# Patient Record
Sex: Female | Born: 1995 | Race: Black or African American | Hispanic: No | Marital: Single | Smoking: Never smoker
Health system: Southern US, Community
[De-identification: ages and names within clinical notes are randomized; demographics above are authoritative.]

## PROBLEM LIST (undated history)

## (undated) ENCOUNTER — Inpatient Hospital Stay (HOSPITAL_COMMUNITY): Payer: Self-pay

## (undated) DIAGNOSIS — O1410 Severe pre-eclampsia, unspecified trimester: Secondary | ICD-10-CM

## (undated) DIAGNOSIS — B951 Streptococcus, group B, as the cause of diseases classified elsewhere: Secondary | ICD-10-CM

## (undated) DIAGNOSIS — A609 Anogenital herpesviral infection, unspecified: Secondary | ICD-10-CM

## (undated) DIAGNOSIS — D649 Anemia, unspecified: Secondary | ICD-10-CM

## (undated) DIAGNOSIS — Z973 Presence of spectacles and contact lenses: Secondary | ICD-10-CM

## (undated) DIAGNOSIS — O09299 Supervision of pregnancy with other poor reproductive or obstetric history, unspecified trimester: Secondary | ICD-10-CM

## (undated) DIAGNOSIS — S334XXA Traumatic rupture of symphysis pubis, initial encounter: Secondary | ICD-10-CM

## (undated) DIAGNOSIS — F329 Major depressive disorder, single episode, unspecified: Secondary | ICD-10-CM

## (undated) DIAGNOSIS — R45851 Suicidal ideations: Secondary | ICD-10-CM

## (undated) DIAGNOSIS — L988 Other specified disorders of the skin and subcutaneous tissue: Secondary | ICD-10-CM

## (undated) DIAGNOSIS — F32A Depression, unspecified: Secondary | ICD-10-CM

## (undated) DIAGNOSIS — O139 Gestational [pregnancy-induced] hypertension without significant proteinuria, unspecified trimester: Secondary | ICD-10-CM

## (undated) DIAGNOSIS — F419 Anxiety disorder, unspecified: Secondary | ICD-10-CM

## (undated) DIAGNOSIS — A491 Streptococcal infection, unspecified site: Secondary | ICD-10-CM

## (undated) HISTORY — DX: Anemia, unspecified: D64.9

## (undated) HISTORY — DX: Streptococcal infection, unspecified site: A49.1

## (undated) HISTORY — DX: Anogenital herpesviral infection, unspecified: A60.9

## (undated) HISTORY — PX: NO PAST SURGERIES: SHX2092

## (undated) HISTORY — DX: Traumatic rupture of symphysis pubis, initial encounter: S33.4XXA

## (undated) HISTORY — DX: Supervision of pregnancy with other poor reproductive or obstetric history, unspecified trimester: O09.299

## (undated) HISTORY — DX: Depression, unspecified: F32.A

## (undated) HISTORY — DX: Major depressive disorder, single episode, unspecified: F32.9

---
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
## 2015-07-20 NOTE — L&D Delivery Note (Addendum)
Delivery Note At 8:49 PM a viable female was delivered via Vaginal, Spontaneous Delivery (Presentation: ROA with compound right arm).  APGAR: 9, 9; weight  .   Placenta status: Spontaneous, intact.  Cord: WNL, with the following complications: .  Cord pH: NA  Anesthesia:  Epidural, local for repair Episiotomy: None Lacerations: 2nd degree perineal; Right periurethral--hemostatic, did not require sutures Suture Repair: 3.0 vicryl Est. Blood Loss (mL): 250  Mom to postpartum.  Baby to Couplet care / Skin to Skin. Patient undecided regarding contraception.  Sporadic BPs meeting treatment critera noted after delivery--will begin Mag sulfate x 24 hours pp.  Will treat BP supplementally with IV meds prn. Repeat PIH labs in the am.  Nigel BridgemanLATHAM, Ercia Crisafulli 03/17/2016, 9:50 PM

## 2015-07-30 ENCOUNTER — Inpatient Hospital Stay (HOSPITAL_COMMUNITY)
Admission: AD | Admit: 2015-07-30 | Discharge: 2015-07-31 | Disposition: A | Discharge status: Home / Self Care | Payer: Medicaid Other | Source: Ambulatory Visit | Attending: Obstetrics and Gynecology | Admitting: Obstetrics and Gynecology

## 2015-07-30 ENCOUNTER — Encounter (HOSPITAL_COMMUNITY): Payer: Self-pay

## 2015-07-30 DIAGNOSIS — Z3201 Encounter for pregnancy test, result positive: Secondary | ICD-10-CM | POA: Diagnosis not present

## 2015-07-30 DIAGNOSIS — R109 Unspecified abdominal pain: Secondary | ICD-10-CM | POA: Diagnosis present

## 2015-07-30 DIAGNOSIS — Z0189 Encounter for other specified special examinations: Secondary | ICD-10-CM | POA: Diagnosis present

## 2015-07-30 DIAGNOSIS — R102 Pelvic and perineal pain: Secondary | ICD-10-CM | POA: Diagnosis not present

## 2015-07-30 DIAGNOSIS — O26891 Other specified pregnancy related conditions, first trimester: Secondary | ICD-10-CM | POA: Insufficient documentation

## 2015-07-30 DIAGNOSIS — Z3A00 Weeks of gestation of pregnancy not specified: Secondary | ICD-10-CM | POA: Insufficient documentation

## 2015-07-30 DIAGNOSIS — O3680X0 Pregnancy with inconclusive fetal viability, not applicable or unspecified: Secondary | ICD-10-CM

## 2015-07-30 DIAGNOSIS — J45909 Unspecified asthma, uncomplicated: Secondary | ICD-10-CM | POA: Diagnosis not present

## 2015-07-30 LAB — URINALYSIS, ROUTINE W REFLEX MICROSCOPIC
Bilirubin Urine: NEGATIVE
Glucose, UA: NEGATIVE mg/dL
HGB URINE DIPSTICK: NEGATIVE
Ketones, ur: 15 mg/dL — AB
Leukocytes, UA: NEGATIVE
Nitrite: NEGATIVE
Protein, ur: NEGATIVE mg/dL
SPECIFIC GRAVITY, URINE: 1.025 (ref 1.005–1.030)
pH: 7.5 (ref 5.0–8.0)

## 2015-07-30 LAB — POCT PREGNANCY, URINE: PREG TEST UR: POSITIVE — AB

## 2015-07-30 NOTE — MAU Note (Signed)
Pt states she had +upt at home tonight. LMP: 07/01/2015. Having some lower abdominal cramping since Monday-thought period was about to start. Rates 2/10. Denies bleeding.

## 2015-07-31 ENCOUNTER — Inpatient Hospital Stay (HOSPITAL_COMMUNITY): Payer: Medicaid Other

## 2015-07-31 DIAGNOSIS — O26891 Other specified pregnancy related conditions, first trimester: Secondary | ICD-10-CM

## 2015-07-31 DIAGNOSIS — R102 Pelvic and perineal pain: Secondary | ICD-10-CM

## 2015-07-31 LAB — RPR: RPR: NONREACTIVE

## 2015-07-31 LAB — GC/CHLAMYDIA PROBE AMP (~~LOC~~) NOT AT ARMC
Chlamydia: NEGATIVE
Neisseria Gonorrhea: NEGATIVE

## 2015-07-31 LAB — CBC
HEMATOCRIT: 33 % — AB (ref 36.0–46.0)
HEMOGLOBIN: 10.7 g/dL — AB (ref 12.0–15.0)
MCH: 27 pg (ref 26.0–34.0)
MCHC: 32.4 g/dL (ref 30.0–36.0)
MCV: 83.1 fL (ref 78.0–100.0)
Platelets: 335 10*3/uL (ref 150–400)
RBC: 3.97 MIL/uL (ref 3.87–5.11)
RDW: 14.5 % (ref 11.5–15.5)
WBC: 9.3 10*3/uL (ref 4.0–10.5)

## 2015-07-31 LAB — WET PREP, GENITAL
Sperm: NONE SEEN
TRICH WET PREP: NONE SEEN
YEAST WET PREP: NONE SEEN

## 2015-07-31 LAB — HIV ANTIBODY (ROUTINE TESTING W REFLEX): HIV Screen 4th Generation wRfx: NONREACTIVE

## 2015-07-31 LAB — HCG, QUANTITATIVE, PREGNANCY: HCG, BETA CHAIN, QUANT, S: 517 m[IU]/mL — AB (ref <5)

## 2015-07-31 LAB — ABO/RH: ABO/RH(D): O POS

## 2015-07-31 NOTE — Discharge Instructions (Signed)

## 2015-07-31 NOTE — MAU Provider Note (Signed)
History     CSN: [id number]  Arrival date and time: 07/30/15 2322   First Provider Initiated Contact with Patient 07/30/15 2344      Chief Complaint  Patient presents with  . Possible Pregnancy  . Abdominal Cramping   Abdominal Cramping This is a new problem. The current episode started in the past 7 days. The onset quality is gradual. The problem occurs intermittently. The problem has been unchanged. The pain is located in the suprapubic region. The pain is at a severity of 3/10. The quality of the pain is cramping. The abdominal pain does not radiate. Pertinent negatives include no constipation, diarrhea, dysuria, fever, frequency, nausea or vomiting. Nothing aggravates the pain. The pain is relieved by nothing. She has tried nothing for the symptoms.    Past Medical History  Diagnosis Date  . Asthma     History reviewed. No pertinent past surgical history.  History reviewed. No pertinent family history.  Social History  Substance Use Topics  . Smoking status: Never Smoker   . Smokeless tobacco: Never Used  . Alcohol Use: No    Allergies: No Known Allergies  Prescriptions prior to admission  Medication Sig Dispense Refill Last Dose  . albuterol (PROVENTIL HFA;VENTOLIN HFA) 108 (90 BASE) MCG/ACT inhaler Inhale 2 puffs into the lungs every 6 (six) hours as needed. For shortness of breath   More than a month at Unknown time  . ibuprofen (ADVIL,MOTRIN) 200 MG tablet Take 200 mg by mouth every 6 (six) hours as needed.   More than a month at Unknown time  . norethindrone-ethinyl estradiol (MICROGESTIN,JUNEL,LOESTRIN) 1-20 MG-MCG tablet Take 1 tablet by mouth daily.   08/12/2013 at Unknown time    Review of Systems  Constitutional: Negative for fever and chills.  Gastrointestinal: Positive for abdominal pain. Negative for nausea, vomiting, diarrhea and constipation.  Genitourinary: Negative for dysuria, urgency and frequency.   Physical Exam   Blood pressure 126/89, pulse  104, temperature 98 F (36.7 C), temperature source Oral, resp. rate 20, height 5' 1.5" (1.562 m), weight 50.803 kg (112 lb), last menstrual period 07/01/2015, SpO2 100 %.  Physical Exam  Nursing note and vitals reviewed. Constitutional: She is oriented to person, place, and time. She appears well-developed and well-nourished. No distress.  HENT:  Head: Normocephalic.  Cardiovascular: Normal rate.   Respiratory: Effort normal.  GI: Soft. There is no tenderness. There is no rebound.  Genitourinary:   External: no lesion Vagina: small amount of white discharge Cervix: pink, smooth, no CMT Uterus: NSSC Adnexa: NT   Neurological: She is alert and oriented to person, place, and time.  Skin: Skin is warm and dry.  Psychiatric: She has a normal mood and affect.   Results for orders placed or performed during the hospital encounter of 07/30/15 (from the past 24 hour(s))  Urinalysis, Routine w reflex microscopic (not at [facility])     Status: Abnormal   Collection Time: 07/30/15 11:27 PM  Result Value Ref Range   Color, Urine YELLOW YELLOW   APPearance CLEAR CLEAR   Specific Gravity, Urine 1.025 1.005 - 1.030   pH 7.5 5.0 - 8.0   Glucose, UA NEGATIVE NEGATIVE mg/dL   Hgb urine dipstick NEGATIVE NEGATIVE   Bilirubin Urine NEGATIVE NEGATIVE   Ketones, ur 15 (A) NEGATIVE mg/dL   Protein, ur NEGATIVE NEGATIVE mg/dL   Nitrite NEGATIVE NEGATIVE   Leukocytes, UA NEGATIVE NEGATIVE  Pregnancy, urine POC     Status: Abnormal   Collection Time: 07/30/15 11:38  PM  Result Value Ref Range   Preg Test, Ur POSITIVE (A) NEGATIVE  Wet prep, genital     Status: Abnormal   Collection Time: 07/30/15 11:50 PM  Result Value Ref Range   Yeast Wet Prep HPF POC NONE SEEN NONE SEEN   Trich, Wet Prep NONE SEEN NONE SEEN   Clue Cells Wet Prep HPF POC PRESENT (A) NONE SEEN   WBC, Wet Prep HPF POC MODERATE (A) NONE SEEN   Sperm NONE SEEN   CBC     Status: Abnormal   Collection Time: 07/31/15 12:03 AM  Result  Value Ref Range   WBC 9.3 4.0 - 10.5 K/uL   RBC 3.97 3.87 - 5.11 MIL/uL   Hemoglobin 10.7 (L) 12.0 - 15.0 g/dL   HCT 62.9 (L) 52.8 - 41.3 %   MCV 83.1 78.0 - 100.0 fL   MCH 27.0 26.0 - 34.0 pg   MCHC 32.4 30.0 - 36.0 g/dL   RDW 24.4 01.0 - 27.2 %   Platelets 335 150 - 400 K/uL  ABO/Rh     Status: None   Collection Time: 07/31/15 12:03 AM  Result Value Ref Range   ABO/RH(D) O POS   hCG, quantitative, pregnancy     Status: Abnormal   Collection Time: 07/31/15 12:03 AM  Result Value Ref Range   hCG, Beta Chain, Quant, S 517 (H) <5 mIU/mL   US Ob Comp Less 14 Wks  07/31/2015  CLINICAL DATA:  20 year old female with abdominal cramping x4 days EXAM: OBSTETRIC <14 WK [place] AND TRANSVAGINAL OB US TECHNIQUE: Both transabdominal and transvaginal ultrasound examinations were performed for complete evaluation of the gestation as well as the maternal uterus, adnexal regions, and pelvic cul-de-sac. Transvaginal technique was performed to assess early pregnancy. COMPARISON:  None. FINDINGS: The uterus is anteverted. No intrauterine pregnancy identified. The endometrium measures 1.1 cm in thickness. Please note with positive HCG level and in the absence of documented intrauterine pregnancy, the possibility of an ectopic pregnancy is not excluded. Correlation with serial HCG levels and follow-up with ultrasound recommended. The right ovary measures 2.8 x 1.6 x 2.0 cm and the left ovary measures 2.4 x 1.3 x 1.6 cm. Small amount of free fluid noted within the pelvis. IMPRESSION: No intrauterine pregnancy identified. Clinical correlation and follow-up recommended. Electronically Signed   By: Elgie Collard M.D.   On: 07/31/2015 00:59   US Ob Transvaginal  07/31/2015  CLINICAL DATA:  20 year old female with abdominal cramping x4 days EXAM: OBSTETRIC <14 WK [place] AND TRANSVAGINAL OB US TECHNIQUE: Both transabdominal and transvaginal ultrasound examinations were performed for complete evaluation of the gestation as well  as the maternal uterus, adnexal regions, and pelvic cul-de-sac. Transvaginal technique was performed to assess early pregnancy. COMPARISON:  None. FINDINGS: The uterus is anteverted. No intrauterine pregnancy identified. The endometrium measures 1.1 cm in thickness. Please note with positive HCG level and in the absence of documented intrauterine pregnancy, the possibility of an ectopic pregnancy is not excluded. Correlation with serial HCG levels and follow-up with ultrasound recommended. The right ovary measures 2.8 x 1.6 x 2.0 cm and the left ovary measures 2.4 x 1.3 x 1.6 cm. Small amount of free fluid noted within the pelvis. IMPRESSION: No intrauterine pregnancy identified. Clinical correlation and follow-up recommended. Electronically Signed   By: Elgie Collard M.D.   On: 07/31/2015 00:59    MAU Course  Procedures  MDM   Assessment and Plan   1. Pregnancy, location unknown   2.  Pelvic pain affecting pregnancy in first trimester, antepartum    DC home Comfort measures reviewed  1stTrimester precautions  Bleeding precautions Ectopic precautions RX: none  Return to MAU as needed FU with OB as planned  Follow-up Information    [facility]   Why:  Saturday 08/01/14 in the morning for blood work.    Contact information:   [address] [id number] [place] [id number] [phone]       Tawnya CrookHogan, Kaiyan Luczak Donovan 07/31/2015, 12:18 AM

## 2015-08-02 ENCOUNTER — Inpatient Hospital Stay (HOSPITAL_COMMUNITY)
Admission: AD | Admit: 2015-08-02 | Discharge: 2015-08-02 | Disposition: A | Discharge status: Home / Self Care | Payer: Medicaid Other | Source: Ambulatory Visit | Attending: Obstetrics & Gynecology | Admitting: Obstetrics & Gynecology

## 2015-08-02 DIAGNOSIS — O0281 Inappropriate change in quantitative human chorionic gonadotropin (hCG) in early pregnancy: Secondary | ICD-10-CM

## 2015-08-02 DIAGNOSIS — R109 Unspecified abdominal pain: Secondary | ICD-10-CM | POA: Diagnosis not present

## 2015-08-02 DIAGNOSIS — O9989 Other specified diseases and conditions complicating pregnancy, childbirth and the puerperium: Secondary | ICD-10-CM | POA: Diagnosis not present

## 2015-08-02 DIAGNOSIS — O3680X0 Pregnancy with inconclusive fetal viability, not applicable or unspecified: Secondary | ICD-10-CM

## 2015-08-02 DIAGNOSIS — O26891 Other specified pregnancy related conditions, first trimester: Secondary | ICD-10-CM | POA: Insufficient documentation

## 2015-08-02 DIAGNOSIS — Z3A01 Less than 8 weeks gestation of pregnancy: Secondary | ICD-10-CM | POA: Insufficient documentation

## 2015-08-02 DIAGNOSIS — O26899 Other specified pregnancy related conditions, unspecified trimester: Secondary | ICD-10-CM

## 2015-08-02 LAB — HCG, QUANTITATIVE, PREGNANCY: hCG, Beta Chain, Quant, S: 1651 m[IU]/mL — ABNORMAL HIGH (ref <5)

## 2015-08-02 NOTE — MAU Provider Note (Signed)
S:  Kristin Bruce is a 20 y.o. female G1P0 at 5063w4d presenting to MAU for a follow up beta hcg level. Per chart review the patient was seen in MAU 2 days ago with abdominal pain. Currently she denies vaginal bleeding or abdominal pain.   Beta hcg level on 07/31/15 was 517.    O:  GENERAL: Well-developed, well-nourished female in no acute distress.  LUNGS: Effort normal SKIN: Warm, dry and without erythema PSYCH: Normal mood and affect  Filed Vitals:   08/02/15 1154  BP: 108/70  Pulse: 81  Temp: 98.1 F (36.7 C)  TempSrc: Oral  Resp: 16   MDM:  Results for orders placed or performed during the hospital encounter of 08/02/15 (from the past 48 hour(s))  hCG, quantitative, pregnancy     Status: Abnormal   Collection Time: 08/02/15 11:05 AM  Result Value Ref Range   hCG, Beta Chain, Quant, S 1651 (H) <5 mIU/mL    Comment:          GEST. AGE      CONC.  (mIU/mL)   <=1 WEEK        5 - 50     2 WEEKS       50 - 500     3 WEEKS       100 - 10,000     4 WEEKS     1,000 - 30,000     5 WEEKS     3,500 - 115,000   6-8 WEEKS     12,000 - 270,000    12 WEEKS     15,000 - 220,000        FEMALE AND NON-PREGNANT FEMALE:     LESS THAN 5 mIU/mL       A:  1. Pregnancy of unknown anatomic location   2. Abdominal pain in pregnancy   3. Elevated level of quantitative hCG for gestational age in early pregnancy     P:  Discharge home in stable condition Follow up US in 7 days; US to call patient to schedule and [facility].  Return to MAU if symptoms worsen.  Pelvic rest Ectopic precautions.   Duane LopeJennifer I Rasch, NP 08/02/2015 1:18 PM

## 2015-08-02 NOTE — MAU Note (Addendum)
When prompted by her mom, reports back has been aching.  Started about the time she missed her period. No pain right now, usually work related, stands a lot with her job.

## 2015-08-02 NOTE — Discharge Instructions (Signed)

## 2015-08-02 NOTE — MAU Note (Signed)
Doing ok.  Not having pain or bleeding.

## 2015-08-08 ENCOUNTER — Ambulatory Visit (INDEPENDENT_AMBULATORY_CARE_PROVIDER_SITE_OTHER): Payer: Medicaid Other | Admitting: Obstetrics & Gynecology

## 2015-08-08 ENCOUNTER — Encounter: Payer: Self-pay | Admitting: Obstetrics & Gynecology

## 2015-08-08 ENCOUNTER — Ambulatory Visit (HOSPITAL_COMMUNITY)
Admission: RE | Admit: 2015-08-08 | Discharge: 2015-08-08 | Disposition: A | Discharge status: Home / Self Care | Payer: Medicaid Other | Source: Ambulatory Visit | Attending: Obstetrics and Gynecology | Admitting: Obstetrics and Gynecology

## 2015-08-08 VITALS — BP 117/77 | HR 98 | Temp 98.7°F | Resp 18 | Ht 60.0 in | Wt 110.0 lb

## 2015-08-08 DIAGNOSIS — O0281 Inappropriate change in quantitative human chorionic gonadotropin (hCG) in early pregnancy: Secondary | ICD-10-CM

## 2015-08-08 DIAGNOSIS — O208 Other hemorrhage in early pregnancy: Secondary | ICD-10-CM | POA: Diagnosis not present

## 2015-08-08 DIAGNOSIS — O26891 Other specified pregnancy related conditions, first trimester: Secondary | ICD-10-CM | POA: Diagnosis not present

## 2015-08-08 DIAGNOSIS — R109 Unspecified abdominal pain: Secondary | ICD-10-CM | POA: Diagnosis not present

## 2015-08-08 DIAGNOSIS — O200 Threatened abortion: Secondary | ICD-10-CM

## 2015-08-08 DIAGNOSIS — Z3A01 Less than 8 weeks gestation of pregnancy: Secondary | ICD-10-CM | POA: Diagnosis not present

## 2015-08-08 DIAGNOSIS — O26899 Other specified pregnancy related conditions, unspecified trimester: Secondary | ICD-10-CM

## 2015-08-08 DIAGNOSIS — O3680X0 Pregnancy with inconclusive fetal viability, not applicable or unspecified: Secondary | ICD-10-CM

## 2015-08-08 MED ORDER — PRENATAL VITAMINS 0.8 MG PO TABS: 1.0000 | ORAL_TABLET | Freq: Every day | ORAL | Status: DC

## 2015-08-08 NOTE — Patient Instructions (Signed)
Incomplete Miscarriage A miscarriage is the sudden loss of an unborn baby (fetus) before the 20th week of pregnancy. In an incomplete miscarriage, parts of the fetus or placenta (afterbirth) remain in the body.  Having a miscarriage can be an emotional experience. Talk with your health care provider about any questions you may have about miscarrying, the grieving process, and your future pregnancy plans. CAUSES   Problems with the fetal chromosomes that make it impossible for the baby to develop normally. Problems with the baby's genes or chromosomes are most often the result of errors that occur by chance as the embryo divides and grows. The problems are not inherited from the parents.  Infection of the cervix or uterus.  Hormone problems.  Problems with the cervix, such as having an incompetent cervix. This is when the tissue in the cervix is not strong enough to hold the pregnancy.  Problems with the uterus, such as an abnormally shaped uterus, uterine fibroids, or congenital abnormalities.  Certain medical conditions.  Smoking, drinking alcohol, or taking illegal drugs.  Trauma. SYMPTOMS   Vaginal bleeding or spotting, with or without cramps or pain.  Pain or cramping in the abdomen or lower back.  Passing fluid, tissue, or blood clots from the vagina. DIAGNOSIS  Your health care provider will perform a physical exam. You may also have an ultrasound to confirm the miscarriage. Blood or urine tests may also be ordered. TREATMENT   Usually, a dilation and curettage (D&C) procedure is performed. During a D&C procedure, the cervix is widened (dilated) and any remaining fetal or placental tissue is gently removed from the uterus.  Antibiotic medicines are prescribed if there is an infection. Other medicines may be given to reduce the size of the uterus (contract) if there is a lot of bleeding.  If you have Rh negative blood and your baby was Rh positive, you will need a Rho (D)  immune globulin shot. This shot will protect any future baby from having Rh blood problems in future pregnancies.  You may be confined to bed rest. This means you should stay in bed and only get up to use the bathroom. HOME CARE INSTRUCTIONS   Rest as directed by your health care provider.  Restrict activity as directed by your health care provider. You may be allowed to continue light activity if curettage was not done but you require further treatment.  Keep track of the number of pads you use each day. Keep track of how soaked (saturated) they are. Record this information.  Do not  use tampons.  Do not douche or have sexual intercourse until approved by your health care provider.  Keep all follow-up appointments for reevaluation and continuing management.  Only take over-the-counter or prescription medicines for pain, fever, or discomfort as directed by your health care provider.  Take antibiotic medicine as directed by your health care provider. Make sure you finish it even if you start to feel better. SEEK IMMEDIATE MEDICAL CARE IF:   You experience severe cramps in your stomach, back, or abdomen.  You have an unexplained temperature (make sure to record these temperatures).  You pass large clots or tissue (save these for your health care provider to inspect).  Your bleeding increases.  You become light-headed, weak, or have fainting episodes. MAKE SURE YOU:   Understand these instructions.  Will watch your condition.  Will get help right away if you are not doing well or get worse.   This information is not intended to   replace advice given to you by your health care provider. Make sure you discuss any questions you have with your health care provider.   Document Released: 07/05/2005 Document Revised: 07/26/2014 Document Reviewed: 02/01/2013 Elsevier Interactive Patient Education 2016 Elsevier Inc.  

## 2015-08-08 NOTE — Progress Notes (Signed)
Patient ID: Kristin Bruce, female   DOB: [birth year], 20 y.o.   MRN: [id number] History:  20 y.o. G1P0 here today for LMP 07/11/2015.  No bleeding noted. Pt reports that she came to the MAU for a UPT and reports some cramping.  She is having no pain and has not had bleeding.  She wanted the UPT because her menses were short and late.  She c/o nipple soreness.  NO N/V   The following portions of the patient's history were reviewed and updated as appropriate: allergies, current medications, past family history, past medical history, past social history, past surgical history and problem list.  Review of Systems:  Pertinent items are noted in HPI.  Objective:  Physical Exam Blood pressure 117/77, pulse 98, temperature 98.7 F (37.1 C), temperature source Oral, resp. rate 18, height 5' (1.524 m), weight 110 lb (49.896 kg), last menstrual period 07/01/2015, SpO2 100 %. Gen: NAD Exam deferred  Labs and Imaging US Ob Comp Less 14 Wks  07/31/2015  CLINICAL DATA:  20 year old female with abdominal cramping x4 days EXAM: OBSTETRIC <14 WK [place] AND TRANSVAGINAL OB US TECHNIQUE: Both transabdominal and transvaginal ultrasound examinations were performed for complete evaluation of the gestation as well as the maternal uterus, adnexal regions, and pelvic cul-de-sac. Transvaginal technique was performed to assess early pregnancy. COMPARISON:  None. FINDINGS: The uterus is anteverted. No intrauterine pregnancy identified. The endometrium measures 1.1 cm in thickness. Please note with positive HCG level and in the absence of documented intrauterine pregnancy, the possibility of an ectopic pregnancy is not excluded. Correlation with serial HCG levels and follow-up with ultrasound recommended. The right ovary measures 2.8 x 1.6 x 2.0 cm and the left ovary measures 2.4 x 1.3 x 1.6 cm. Small amount of free fluid noted within the pelvis. IMPRESSION: No intrauterine pregnancy identified. Clinical correlation and follow-up  recommended. Electronically Signed   By: Elgie Collard M.D.   On: 07/31/2015 00:59   US Ob Transvaginal  08/08/2015  CLINICAL DATA:  First trimester pregnancy with persistent pelvic cramping. Beta HCG level 517 on 07/31/2015 and 1,651 on 08/02/2015. EXAM: TRANSVAGINAL OB ULTRASOUND TECHNIQUE: Transvaginal ultrasound was performed for complete evaluation of the gestation as well as the maternal uterus, adnexal regions, and pelvic cul-de-sac. COMPARISON:  Obstetrical ultrasound 07/31/2015. FINDINGS: Intrauterine gestational sac: Visualized/normal in shape. Yolk sac:  Visualized Embryo:  Not visualized. Cardiac Activity: Not visualized MSD: 9.5  mm   5 w 5 d     [facility] 04/04/2016 Subchorionic hemorrhage: There is a small subchorionic hematoma, measuring 1.9 x 1.7 x 0.8 cm. Maternal uterus/adnexae: Both maternal ovaries are visualized. There is a probable small corpus luteal cyst on the right. No adnexal mass. Trace free pelvic fluid. IMPRESSION: 1. A gestational sac and yolk sac are now visualized, but there is no visible embryo. Recommend follow-up quantitative B-HCG levels and follow-up US in 14 days to confirm and assess viability. This recommendation follows SRU consensus guidelines: Diagnostic Criteria for Nonviable Pregnancy Early in the First Trimester. Malva Limes Med 2013; 409:8119-14. 2. Small subchorionic hematoma. 3. No adnexal mass. Electronically Signed   By: Carey Bullocks M.D.   On: 08/08/2015 10:14   US Ob Transvaginal  07/31/2015  CLINICAL DATA:  20 year old female with abdominal cramping x4 days EXAM: OBSTETRIC <14 WK [place] AND TRANSVAGINAL OB US TECHNIQUE: Both transabdominal and transvaginal ultrasound examinations were performed for complete evaluation of the gestation as well as the maternal uterus, adnexal regions, and pelvic cul-de-sac. Transvaginal technique  was performed to assess early pregnancy. COMPARISON:  None. FINDINGS: The uterus is anteverted. No intrauterine pregnancy identified.  The endometrium measures 1.1 cm in thickness. Please note with positive HCG level and in the absence of documented intrauterine pregnancy, the possibility of an ectopic pregnancy is not excluded. Correlation with serial HCG levels and follow-up with ultrasound recommended. The right ovary measures 2.8 x 1.6 x 2.0 cm and the left ovary measures 2.4 x 1.3 x 1.6 cm. Small amount of free fluid noted within the pelvis. IMPRESSION: No intrauterine pregnancy identified. Clinical correlation and follow-up recommended. Electronically Signed   By: Elgie Collard M.D.   On: 07/31/2015 00:59   Quant HCG: 07/31/2015:  517 08/02/2015:  1651   Assessment & Plan:  Threatened ab vs early IUP  Repeat Quant HCG today Repeat sono in 2 weeks Pt to f/u if bleeding or pain develops If confirmed IUP rec f/u for NOB PNV 1 po q day  Rodolphe Edmonston L. Harraway-Smith, M.D., FACOG    Addendum: just got notice that the pt did not get her lab drawn.  Given that she has a GS will proceed with the f/u sono in 2 weeks only.

## 2015-08-22 ENCOUNTER — Ambulatory Visit (HOSPITAL_COMMUNITY)
Admission: RE | Admit: 2015-08-22 | Discharge: 2015-08-22 | Disposition: A | Discharge status: Home / Self Care | Payer: Medicaid Other | Source: Ambulatory Visit | Attending: Obstetrics & Gynecology | Admitting: Obstetrics & Gynecology

## 2015-08-22 ENCOUNTER — Encounter: Payer: Self-pay | Admitting: Obstetrics and Gynecology

## 2015-08-22 ENCOUNTER — Ambulatory Visit (INDEPENDENT_AMBULATORY_CARE_PROVIDER_SITE_OTHER): Payer: Self-pay | Admitting: Obstetrics and Gynecology

## 2015-08-22 DIAGNOSIS — Z3A01 Less than 8 weeks gestation of pregnancy: Secondary | ICD-10-CM | POA: Insufficient documentation

## 2015-08-22 DIAGNOSIS — O200 Threatened abortion: Secondary | ICD-10-CM | POA: Diagnosis not present

## 2015-08-22 DIAGNOSIS — Z3490 Encounter for supervision of normal pregnancy, unspecified, unspecified trimester: Secondary | ICD-10-CM

## 2015-08-22 DIAGNOSIS — Z3481 Encounter for supervision of other normal pregnancy, first trimester: Secondary | ICD-10-CM

## 2015-08-22 NOTE — Progress Notes (Signed)
Patient ID: Kristin Bruce, female   DOB: Jan 08, 1996, 20 y.o.   MRN: [id number] Ultrasounds Results Note  SUBJECTIVE HPI:  Ms. Kristin Bruce is a 20 y.o. G1P0 at [redacted]w[redacted]d by LMP who presents to the [facility] for followup ultrasound results. The patient denies abdominal pain or vaginal bleeding.  Upon review of the patient's records, patient was first seen in MAU on 1/14 for HCG follow up.   BHCG on that day was 1400.  Ultrasound showed a IUGS with a yolk sac. No fetal pole. Repeat ultrasound was performed earlier today.   Past Medical History  Diagnosis Date  . Asthma    No past surgical history on file. Social History   Social History  . Marital Status: Single    Spouse Name: N/A  . Number of Children: N/A  . Years of Education: N/A   Occupational History  . Not on file.   Social History Main Topics  . Smoking status: Never Smoker   . Smokeless tobacco: Never Used  . Alcohol Use: No  . Drug Use: No  . Sexual Activity: Yes    Birth Control/ Protection: Pill   Other Topics Concern  . Not on file   Social History Narrative   Current Outpatient Prescriptions on File Prior to Visit  Medication Sig Dispense Refill  . albuterol (PROVENTIL HFA;VENTOLIN HFA) 108 (90 BASE) MCG/ACT inhaler Inhale 2 puffs into the lungs every 6 (six) hours as needed. For shortness of breath    . Prenatal Multivit-Min-Fe-FA (PRENATAL VITAMINS) 0.8 MG tablet Take 1 tablet by mouth daily. 30 tablet 12   No current facility-administered medications on file prior to visit.   No Known Allergies  I have reviewed patient's Past Medical Hx, Surgical Hx, Family Hx, Social Hx, medications and allergies.   Review of Systems Review of Systems  Constitutional: Negative for fever and chills.  Gastrointestinal: Negative for nausea, vomiting, abdominal pain, diarrhea and constipation.  Genitourinary: Negative for dysuria.  Musculoskeletal: Negative for back pain.  Neurological: Negative for  dizziness and weakness.    Physical Exam  LMP 07/01/2015  GENERAL: Well-developed, well-nourished female in no acute distress.  HEENT: Normocephalic, atraumatic.   LUNGS: Effort normal ABDOMEN: soft, non-tender HEART: Regular rate  SKIN: Warm, dry and without erythema PSYCH: Normal mood and affect NEURO: Alert and oriented x 4  LAB RESULTS No results found for this or any previous visit (from the past 24 hour(s)).  IMAGING US Ob Comp Less 14 Wks  07/31/2015  CLINICAL DATA:  20 year old female with abdominal cramping x4 days EXAM: OBSTETRIC <14 WK [place] AND TRANSVAGINAL OB US TECHNIQUE: Both transabdominal and transvaginal ultrasound examinations were performed for complete evaluation of the gestation as well as the maternal uterus, adnexal regions, and pelvic cul-de-sac. Transvaginal technique was performed to assess early pregnancy. COMPARISON:  None. FINDINGS: The uterus is anteverted. No intrauterine pregnancy identified. The endometrium measures 1.1 cm in thickness. Please note with positive HCG level and in the absence of documented intrauterine pregnancy, the possibility of an ectopic pregnancy is not excluded. Correlation with serial HCG levels and follow-up with ultrasound recommended. The right ovary measures 2.8 x 1.6 x 2.0 cm and the left ovary measures 2.4 x 1.3 x 1.6 cm. Small amount of free fluid noted within the pelvis. IMPRESSION: No intrauterine pregnancy identified. Clinical correlation and follow-up recommended. Electronically Signed   By: Elgie Collard M.D.   On: 07/31/2015 00:59   US Ob Transvaginal  08/22/2015  CLINICAL  DATA:  Threatened abortion. History of abdominal cramping with no pregnancy visualized on prior ultrasound. Rising quantitative HCG. Subsequent encounter. EXAM: TRANSVAGINAL OB ULTRASOUND TECHNIQUE: Transvaginal ultrasound was performed for complete evaluation of the gestation as well as the maternal uterus, adnexal regions, and pelvic cul-de-sac.  COMPARISON:  Ob ultrasound 07/31/2015 and 08/08/2015. FINDINGS: Yolk sac:  Visualized. Embryo:  Visualized. Cardiac Activity: Detected. Heart Rate: 153  bpm CRL:  1.15  cm   7 w   2 d                  [place] EDC: 04/07/2016. Subchorionic hemorrhage:  None visualized. Maternal uterus/adnexae: Unremarkable. Trace amount of free pelvic fluid noted. IMPRESSION: Single living intrauterine pregnancy.  No acute abnormality. Electronically Signed   By: Drusilla Kanner M.D.   On: 08/22/2015 09:49   US Ob Transvaginal  08/08/2015  CLINICAL DATA:  First trimester pregnancy with persistent pelvic cramping. Beta HCG level 517 on 07/31/2015 and 1,651 on 08/02/2015. EXAM: TRANSVAGINAL OB ULTRASOUND TECHNIQUE: Transvaginal ultrasound was performed for complete evaluation of the gestation as well as the maternal uterus, adnexal regions, and pelvic cul-de-sac. COMPARISON:  Obstetrical ultrasound 07/31/2015. FINDINGS: Intrauterine gestational sac: Visualized/normal in shape. Yolk sac:  Visualized Embryo:  Not visualized. Cardiac Activity: Not visualized MSD: 9.5  mm   5 w 5 d     [facility] 04/04/2016 Subchorionic hemorrhage: There is a small subchorionic hematoma, measuring 1.9 x 1.7 x 0.8 cm. Maternal uterus/adnexae: Both maternal ovaries are visualized. There is a probable small corpus luteal cyst on the right. No adnexal mass. Trace free pelvic fluid. IMPRESSION: 1. A gestational sac and yolk sac are now visualized, but there is no visible embryo. Recommend follow-up quantitative B-HCG levels and follow-up US in 14 days to confirm and assess viability. This recommendation follows SRU consensus guidelines: Diagnostic Criteria for Nonviable Pregnancy Early in the First Trimester. Malva Limes Med 2013; 536:6440-34. 2. Small subchorionic hematoma. 3. No adnexal mass. Electronically Signed   By: Carey Bullocks M.D.   On: 08/08/2015 10:14   US Ob Transvaginal  07/31/2015  CLINICAL DATA:  20 year old female with abdominal cramping x4 days  EXAM: OBSTETRIC <14 WK [place] AND TRANSVAGINAL OB US TECHNIQUE: Both transabdominal and transvaginal ultrasound examinations were performed for complete evaluation of the gestation as well as the maternal uterus, adnexal regions, and pelvic cul-de-sac. Transvaginal technique was performed to assess early pregnancy. COMPARISON:  None. FINDINGS: The uterus is anteverted. No intrauterine pregnancy identified. The endometrium measures 1.1 cm in thickness. Please note with positive HCG level and in the absence of documented intrauterine pregnancy, the possibility of an ectopic pregnancy is not excluded. Correlation with serial HCG levels and follow-up with ultrasound recommended. The right ovary measures 2.8 x 1.6 x 2.0 cm and the left ovary measures 2.4 x 1.3 x 1.6 cm. Small amount of free fluid noted within the pelvis. IMPRESSION: No intrauterine pregnancy identified. Clinical correlation and follow-up recommended. Electronically Signed   By: Elgie Collard M.D.   On: 07/31/2015 00:59    ASSESSMENT 1. Pregnancy     PLAN Discharge home in stable condition Patient advised to start taking prenatal vitamins Pregnancy confirmation letter given Patient advised to start prenatal care with [facility] provider of choice as soon as possible  Catalina Antigua, MD  08/22/2015  9:56 AM

## 2015-09-10 ENCOUNTER — Inpatient Hospital Stay (HOSPITAL_COMMUNITY)
Admission: AD | Admit: 2015-09-10 | Discharge: 2015-09-10 | Disposition: A | Discharge status: Home / Self Care | Payer: Medicaid Other | Source: Ambulatory Visit | Attending: Obstetrics and Gynecology | Admitting: Obstetrics and Gynecology

## 2015-09-10 ENCOUNTER — Encounter (HOSPITAL_COMMUNITY): Payer: Self-pay

## 2015-09-10 DIAGNOSIS — R109 Unspecified abdominal pain: Secondary | ICD-10-CM | POA: Diagnosis not present

## 2015-09-10 DIAGNOSIS — R102 Pelvic and perineal pain: Secondary | ICD-10-CM | POA: Insufficient documentation

## 2015-09-10 DIAGNOSIS — O26891 Other specified pregnancy related conditions, first trimester: Secondary | ICD-10-CM | POA: Diagnosis not present

## 2015-09-10 DIAGNOSIS — Z3A10 10 weeks gestation of pregnancy: Secondary | ICD-10-CM | POA: Diagnosis not present

## 2015-09-10 DIAGNOSIS — O9989 Other specified diseases and conditions complicating pregnancy, childbirth and the puerperium: Secondary | ICD-10-CM | POA: Diagnosis not present

## 2015-09-10 DIAGNOSIS — O26899 Other specified pregnancy related conditions, unspecified trimester: Secondary | ICD-10-CM

## 2015-09-10 LAB — URINALYSIS, ROUTINE W REFLEX MICROSCOPIC
BILIRUBIN URINE: NEGATIVE
GLUCOSE, UA: NEGATIVE mg/dL
Hgb urine dipstick: NEGATIVE
KETONES UR: NEGATIVE mg/dL
Leukocytes, UA: NEGATIVE
Nitrite: NEGATIVE
PH: 6 (ref 5.0–8.0)
PROTEIN: NEGATIVE mg/dL
Specific Gravity, Urine: 1.02 (ref 1.005–1.030)

## 2015-09-10 LAB — WET PREP, GENITAL
Clue Cells Wet Prep HPF POC: NONE SEEN
Sperm: NONE SEEN
TRICH WET PREP: NONE SEEN
Yeast Wet Prep HPF POC: NONE SEEN

## 2015-09-10 NOTE — MAU Provider Note (Signed)
History     CSN: [id number]  Arrival date and time: 09/10/15 1851   First Provider Initiated Contact with Patient 09/10/15 2202      Chief Complaint  Patient presents with  . Back Pain  . Vaginal Pain   Vaginal Pain The patient's primary symptoms include pelvic pain. The patient's pertinent negatives include no vaginal bleeding. Primary symptoms comment: pain radiated to her back when it occured. . This is a new problem. The current episode started today. The problem occurs intermittently. The problem has been resolved. The patient is experiencing no pain. She is pregnant. Associated symptoms include abdominal pain. Pertinent negatives include no chills, constipation, diarrhea, dysuria, fever, frequency, nausea, urgency or vomiting. Nothing aggravates the symptoms. She has tried nothing for the symptoms. She is sexually active.    Past Medical History  Diagnosis Date  . Asthma     Past Surgical History  Procedure Laterality Date  . No past surgeries      No family history on file.  Social History  Substance Use Topics  . Smoking status: Never Smoker   . Smokeless tobacco: Never Used  . Alcohol Use: No    Allergies: No Known Allergies  Prescriptions prior to admission  Medication Sig Dispense Refill Last Dose  . acetaminophen (TYLENOL) 500 MG tablet Take 1,000 mg by mouth every 6 (six) hours as needed for mild pain or headache.   Past Week at Unknown time  . Prenatal Multivit-Min-Fe-FA (PRENATAL VITAMINS) 0.8 MG tablet Take 1 tablet by mouth daily. 30 tablet 12 Past Week at Unknown time  . albuterol (PROVENTIL HFA;VENTOLIN HFA) 108 (90 BASE) MCG/ACT inhaler Inhale 2 puffs into the lungs every 6 (six) hours as needed. For shortness of breath   Rescue    Review of Systems  Constitutional: Negative for fever and chills.  Gastrointestinal: Positive for abdominal pain. Negative for nausea, vomiting, diarrhea and constipation.  Genitourinary: Positive for vaginal pain and  pelvic pain. Negative for dysuria, urgency and frequency.   Physical Exam   Blood pressure 115/73, pulse 87, temperature 98.5 F (36.9 C), temperature source Oral, resp. rate 16, height  (1.549 m), weight 53.615 kg (118 lb 3.2 oz), last menstrual period 07/01/2015, SpO2 100 %.  Physical Exam  Nursing note and vitals reviewed. Constitutional: She is oriented to person, place, and time. She appears well-developed and well-nourished. No distress.  HENT:  Head: Normocephalic.  Cardiovascular: Normal rate.   Respiratory: Effort normal.  GI: Soft. There is no tenderness. There is no rebound.  Neurological: She is alert and oriented to person, place, and time.  Skin: Skin is warm and dry.  Psychiatric: She has a normal mood and affect.   Results for orders placed or performed during the hospital encounter of 09/10/15 (from the past 24 hour(s))  Urinalysis, Routine w reflex microscopic (not at [facility])     Status: None   Collection Time: 09/10/15  6:55 PM  Result Value Ref Range   Color, Urine YELLOW YELLOW   APPearance CLEAR CLEAR   Specific Gravity, Urine 1.020 1.005 - 1.030   pH 6.0 5.0 - 8.0   Glucose, UA NEGATIVE NEGATIVE mg/dL   Hgb urine dipstick NEGATIVE NEGATIVE   Bilirubin Urine NEGATIVE NEGATIVE   Ketones, ur NEGATIVE NEGATIVE mg/dL   Protein, ur NEGATIVE NEGATIVE mg/dL   Nitrite NEGATIVE NEGATIVE   Leukocytes, UA NEGATIVE NEGATIVE  Wet prep, genital     Status: Abnormal   Collection Time: 09/10/15 10:05 PM  Result  Value Ref Range   Yeast Wet Prep HPF POC NONE SEEN NONE SEEN   Trich, Wet Prep NONE SEEN NONE SEEN   Clue Cells Wet Prep HPF POC NONE SEEN NONE SEEN   WBC, Wet Prep HPF POC MODERATE (A) NONE SEEN   Sperm NONE SEEN     MAU Course  Procedures  MDM   Assessment and Plan   1. Abdominal pain in pregnancy    DC home Comfort measures reviewed  1st Trimester precautions  RX: none  Return to MAU as needed FU with OB as planned  Follow-up Information     Schedule an appointment as soon as possible for a visit with [facility].   Contact information:   [address] [place] [address] [phone]         Tawnya Crook 09/10/2015, 10:03 PM

## 2015-09-10 NOTE — MAU Note (Signed)
Urine in lab 

## 2015-09-10 NOTE — MAU Note (Signed)
Pt c/o back pain and vaginal pain that started around 530pm. Is not having the pain now, but was 5/10 at home. Denies vag bleeding or discharge. Denies urinary s/s.

## 2015-09-10 NOTE — Discharge Instructions (Signed)
First Trimester of Pregnancy The first trimester of pregnancy is from week 1 until the end of week 12 (months 1 through 3). A week after a sperm fertilizes an egg, the egg will implant on the wall of the uterus. This embryo will begin to develop into a baby. Genes from you and your partner are forming the baby. The female genes determine whether the baby is a boy or a girl. At 6-8 weeks, the eyes and face are formed, and the heartbeat can be seen on ultrasound. At the end of 12 weeks, all the baby's organs are formed.  Now that you are pregnant, you will want to do everything you can to have a healthy baby. Two of the most important things are to get good prenatal care and to follow your health care provider's instructions. Prenatal care is all the medical care you receive before the baby's birth. This care will help prevent, find, and treat any problems during the pregnancy and childbirth. BODY CHANGES Your body goes through many changes during pregnancy. The changes vary from woman to woman.   You may gain or lose a couple of pounds at first.  You may feel sick to your stomach (nauseous) and throw up (vomit). If the vomiting is uncontrollable, call your health care provider.  You may tire easily.  You may develop headaches that can be relieved by medicines approved by your health care provider.  You may urinate more often. Painful urination may mean you have a bladder infection.  You may develop heartburn as a result of your pregnancy.  You may develop constipation because certain hormones are causing the muscles that push waste through your intestines to slow down.  You may develop hemorrhoids or swollen, bulging veins (varicose veins).  Your breasts may begin to grow larger and become tender. Your nipples may stick out more, and the tissue that surrounds them (areola) may become darker.  Your gums may bleed and may be sensitive to brushing and flossing.  Dark spots or blotches (chloasma,  mask of pregnancy) may develop on your face. This will likely fade after the baby is born.  Your menstrual periods will stop.  You may have a loss of appetite.  You may develop cravings for certain kinds of food.  You may have changes in your emotions from day to day, such as being excited to be pregnant or being concerned that something may go wrong with the pregnancy and baby.  You may have more vivid and strange dreams.  You may have changes in your hair. These can include thickening of your hair, rapid growth, and changes in texture. Some women also have hair loss during or after pregnancy, or hair that feels dry or thin. Your hair will most likely return to normal after your baby is born. WHAT TO EXPECT AT YOUR PRENATAL VISITS During a routine prenatal visit:  You will be weighed to make sure you and the baby are growing normally.  Your blood pressure will be taken.  Your abdomen will be measured to track your baby's growth.  The fetal heartbeat will be listened to starting around week 10 or 12 of your pregnancy.  Test results from any previous visits will be discussed. Your health care provider may ask you:  How you are feeling.  If you are feeling the baby move.  If you have had any abnormal symptoms, such as leaking fluid, bleeding, severe headaches, or abdominal cramping.  If you are using any tobacco products,   including cigarettes, chewing tobacco, and electronic cigarettes.  If you have any questions. Other tests that may be performed during your first trimester include:  Blood tests to find your blood type and to check for the presence of any previous infections. They will also be used to check for low iron levels (anemia) and Rh antibodies. Later in the pregnancy, blood tests for diabetes will be done along with other tests if problems develop.  Urine tests to check for infections, diabetes, or protein in the urine.  An ultrasound to confirm the proper growth  and development of the baby.  An amniocentesis to check for possible genetic problems.  Fetal screens for spina bifida and Down syndrome.  You may need other tests to make sure you and the baby are doing well.  HIV (human immunodeficiency virus) testing. Routine prenatal testing includes screening for HIV, unless you choose not to have this test. HOME CARE INSTRUCTIONS  Medicines  Follow your health care provider's instructions regarding medicine use. Specific medicines may be either safe or unsafe to take during pregnancy.  Take your prenatal vitamins as directed.  If you develop constipation, try taking a stool softener if your health care provider approves. Diet  Eat regular, well-balanced meals. Choose a variety of foods, such as meat or vegetable-based protein, fish, milk and low-fat dairy products, vegetables, fruits, and whole grain breads and cereals. Your health care provider will help you determine the amount of weight gain that is right for you.  Avoid raw meat and uncooked cheese. These carry germs that can cause birth defects in the baby.  Eating four or five small meals rather than three large meals a day may help relieve nausea and vomiting. If you start to feel nauseous, eating a few soda crackers can be helpful. Drinking liquids between meals instead of during meals also seems to help nausea and vomiting.  If you develop constipation, eat more high-fiber foods, such as fresh vegetables or fruit and whole grains. Drink enough fluids to keep your urine clear or pale yellow. Activity and Exercise  Exercise only as directed by your health care provider. Exercising will help you:  Control your weight.  Stay in shape.  Be prepared for labor and delivery.  Experiencing pain or cramping in the lower abdomen or low back is a good sign that you should stop exercising. Check with your health care provider before continuing normal exercises.  Try to avoid standing for long  periods of time. Move your legs often if you must stand in one place for a long time.  Avoid heavy lifting.  Wear low-heeled shoes, and practice good posture.  You may continue to have sex unless your health care provider directs you otherwise. Relief of Pain or Discomfort  Wear a good support bra for breast tenderness.   Take warm sitz baths to soothe any pain or discomfort caused by hemorrhoids. Use hemorrhoid cream if your health care provider approves.   Rest with your legs elevated if you have leg cramps or low back pain.  If you develop varicose veins in your legs, wear support hose. Elevate your feet for 15 minutes, 3-4 times a day. Limit salt in your diet. Prenatal Care  Schedule your prenatal visits by the twelfth week of pregnancy. They are usually scheduled monthly at first, then more often in the last 2 months before delivery.  Write down your questions. Take them to your prenatal visits.  Keep all your prenatal visits as directed by your   health care provider. Safety  Wear your seat belt at all times when driving.  Make a list of emergency phone numbers, including numbers for family, friends, the hospital, and police and fire departments. General Tips  Ask your health care provider for a referral to a local prenatal education class. Begin classes no later than at the beginning of month 6 of your pregnancy.  Ask for help if you have counseling or nutritional needs during pregnancy. Your health care provider can offer advice or refer you to specialists for help with various needs.  Do not use hot tubs, steam rooms, or saunas.  Do not douche or use tampons or scented sanitary pads.  Do not cross your legs for long periods of time.  Avoid cat litter boxes and soil used by cats. These carry germs that can cause birth defects in the baby and possibly loss of the fetus by miscarriage or stillbirth.  Avoid all smoking, herbs, alcohol, and medicines not prescribed by  your health care provider. Chemicals in these affect the formation and growth of the baby.  Do not use any tobacco products, including cigarettes, chewing tobacco, and electronic cigarettes. If you need help quitting, ask your health care provider. You may receive counseling support and other resources to help you quit.  Schedule a dentist appointment. At home, brush your teeth with a soft toothbrush and be gentle when you floss. SEEK MEDICAL CARE IF:   You have dizziness.  You have mild pelvic cramps, pelvic pressure, or nagging pain in the abdominal area.  You have persistent nausea, vomiting, or diarrhea.  You have a bad smelling vaginal discharge.  You have pain with urination.  You notice increased swelling in your face, hands, legs, or ankles. SEEK IMMEDIATE MEDICAL CARE IF:   You have a fever.  You are leaking fluid from your vagina.  You have spotting or bleeding from your vagina.  You have severe abdominal cramping or pain.  You have rapid weight gain or loss.  You vomit blood or material that looks like coffee grounds.  You are exposed to German measles and have never had them.  You are exposed to fifth disease or chickenpox.  You develop a severe headache.  You have shortness of breath.  You have any kind of trauma, such as from a fall or a car accident.   This information is not intended to replace advice given to you by your health care provider. Make sure you discuss any questions you have with your health care provider.   Document Released: 06/29/2001 Document Revised: 07/26/2014 Document Reviewed: 05/15/2013 Elsevier Interactive Patient Education 2016 [employer]  

## 2015-09-11 LAB — GC/CHLAMYDIA PROBE AMP (~~LOC~~) NOT AT ARMC
Chlamydia: NEGATIVE
NEISSERIA GONORRHEA: NEGATIVE

## 2015-09-18 ENCOUNTER — Inpatient Hospital Stay (HOSPITAL_COMMUNITY)
Admission: AD | Admit: 2015-09-18 | Discharge: 2015-09-18 | Disposition: A | Discharge status: Home / Self Care | Payer: Medicaid Other | Source: Ambulatory Visit | Attending: Obstetrics & Gynecology | Admitting: Obstetrics & Gynecology

## 2015-09-18 ENCOUNTER — Encounter (HOSPITAL_COMMUNITY): Payer: Self-pay | Admitting: *Deleted

## 2015-09-18 DIAGNOSIS — O26891 Other specified pregnancy related conditions, first trimester: Secondary | ICD-10-CM | POA: Diagnosis not present

## 2015-09-18 DIAGNOSIS — Z3A11 11 weeks gestation of pregnancy: Secondary | ICD-10-CM | POA: Insufficient documentation

## 2015-09-18 DIAGNOSIS — O9989 Other specified diseases and conditions complicating pregnancy, childbirth and the puerperium: Secondary | ICD-10-CM | POA: Diagnosis not present

## 2015-09-18 DIAGNOSIS — K6289 Other specified diseases of anus and rectum: Secondary | ICD-10-CM | POA: Diagnosis present

## 2015-09-18 DIAGNOSIS — L0232 Furuncle of buttock: Secondary | ICD-10-CM | POA: Diagnosis not present

## 2015-09-18 LAB — URINALYSIS, ROUTINE W REFLEX MICROSCOPIC
BILIRUBIN URINE: NEGATIVE
GLUCOSE, UA: NEGATIVE mg/dL
HGB URINE DIPSTICK: NEGATIVE
KETONES UR: 15 mg/dL — AB
Leukocytes, UA: NEGATIVE
NITRITE: NEGATIVE
PH: 7 (ref 5.0–8.0)
Protein, ur: NEGATIVE mg/dL
SPECIFIC GRAVITY, URINE: 1.015 (ref 1.005–1.030)

## 2015-09-18 MED ORDER — OXYCODONE-ACETAMINOPHEN 5-325 MG PO TABS: 1.0000 | ORAL_TABLET | ORAL | Status: DC | PRN

## 2015-09-18 MED ORDER — LIDOCAINE 5 % EX OINT: 1.0000 "application " | TOPICAL_OINTMENT | Freq: Four times a day (QID) | CUTANEOUS | Status: DC | PRN

## 2015-09-18 MED ORDER — OXYCODONE-ACETAMINOPHEN 5-325 MG PO TABS
1.0000 | ORAL_TABLET | Freq: Once | ORAL | Status: AC
  Administered 2015-09-18: 1 via ORAL
  Filled 2015-09-18: qty 1

## 2015-09-18 NOTE — Discharge Instructions (Signed)

## 2015-09-18 NOTE — MAU Provider Note (Signed)
  History     CSN: [id number]  Arrival date and time: 09/18/15 1735   First Provider Initiated Contact with Patient 09/18/15 1803      No chief complaint on file.  HPI [name] 20 y.o. G1P0 @ [redacted]w[redacted]d presents to MAU with rectal pain. She has a history of having "boils" frequently and feels like this is what it is but has been unable to see or feel a bump. She is having difficul;ty walking or sitting down. She says her butt crack is sore and hurting very bad since yesterday. Denies vaginal bleeding or cramping   Past Medical History  Diagnosis Date  . Asthma     Past Surgical History  Procedure Laterality Date  . No past surgeries      History reviewed. No pertinent family history.  Social History  Substance Use Topics  . Smoking status: Never Smoker   . Smokeless tobacco: Never Used  . Alcohol Use: No    Allergies: No Known Allergies  Prescriptions prior to admission  Medication Sig Dispense Refill Last Dose  . acetaminophen (TYLENOL) 500 MG tablet Take 1,000 mg by mouth every 6 (six) hours as needed for mild pain or headache.   09/17/2015 at Unknown time  . albuterol (PROVENTIL HFA;VENTOLIN HFA) 108 (90 BASE) MCG/ACT inhaler Inhale 2 puffs into the lungs every 6 (six) hours as needed. For shortness of breath   Past Month at Unknown time  . Prenatal Multivit-Min-Fe-FA (PRENATAL VITAMINS) 0.8 MG tablet Take 1 tablet by mouth daily. 30 tablet 12 09/17/2015 at Unknown time    Review of Systems  Constitutional: Negative for fever.  Skin:       Reddened area in gluteal cleft  All other systems reviewed and are negative.  Physical Exam   Blood pressure 124/78, pulse 116, temperature 99 F (37.2 C), resp. rate 18, last menstrual period 07/01/2015.  Physical Exam  Nursing note and vitals reviewed. Constitutional: She is oriented to person, place, and time. She appears well-developed and well-nourished.  HENT:  Head: Normocephalic and atraumatic.  Cardiovascular:  Normal rate.   Respiratory: Effort normal. No respiratory distress.  GI: Soft. There is no tenderness.  Genitourinary:  Reddened and slightlty firm .5 cm edematous area that is very tender to the touch at the proximal aspect.  Musculoskeletal: Normal range of motion.  Neurological: She is alert and oriented to person, place, and time.  Skin: Skin is warm and dry.  Psychiatric: She has a normal mood and affect. Her behavior is normal. Judgment and thought content normal.    MAU Course  Procedures  MDM  Assessment and Plan  Boil on buttock Lidocaine Gel RX Percocet 5/325mg  #10 Discharge  Ly Wass Grissett 09/18/2015, 6:55 PM

## 2015-09-18 NOTE — MAU Note (Signed)
Pt presents to MAU with complaints of pain in her buttocks. Pt states she is unable to sit due to the pain. Denies any vaginal bleeding bleeding or abnormal discharge

## 2015-09-24 LAB — OB RESULTS CONSOLE HIV ANTIBODY (ROUTINE TESTING): HIV: NONREACTIVE

## 2015-09-24 LAB — OB RESULTS CONSOLE GC/CHLAMYDIA
Chlamydia: NEGATIVE
Gonorrhea: NEGATIVE

## 2015-09-24 LAB — OB RESULTS CONSOLE RPR: RPR: NONREACTIVE

## 2015-09-24 LAB — OB RESULTS CONSOLE HEPATITIS B SURFACE ANTIGEN: Hepatitis B Surface Ag: NEGATIVE

## 2015-09-24 LAB — OB RESULTS CONSOLE RUBELLA ANTIBODY, IGM: Rubella: IMMUNE

## 2015-11-21 ENCOUNTER — Encounter (HOSPITAL_COMMUNITY): Payer: Self-pay

## 2015-11-21 ENCOUNTER — Emergency Department (HOSPITAL_COMMUNITY)
Admission: EM | Admit: 2015-11-21 | Discharge: 2015-11-22 | Disposition: A | Discharge status: Home / Self Care | Payer: Medicaid Other | Attending: Emergency Medicine | Admitting: Emergency Medicine

## 2015-11-21 DIAGNOSIS — Z3490 Encounter for supervision of normal pregnancy, unspecified, unspecified trimester: Secondary | ICD-10-CM

## 2015-11-21 DIAGNOSIS — Y9389 Activity, other specified: Secondary | ICD-10-CM | POA: Insufficient documentation

## 2015-11-21 DIAGNOSIS — S60812A Abrasion of left wrist, initial encounter: Secondary | ICD-10-CM | POA: Insufficient documentation

## 2015-11-21 DIAGNOSIS — J45909 Unspecified asthma, uncomplicated: Secondary | ICD-10-CM | POA: Diagnosis not present

## 2015-11-21 DIAGNOSIS — Z3A20 20 weeks gestation of pregnancy: Secondary | ICD-10-CM | POA: Insufficient documentation

## 2015-11-21 DIAGNOSIS — F4321 Adjustment disorder with depressed mood: Secondary | ICD-10-CM | POA: Diagnosis not present

## 2015-11-21 DIAGNOSIS — O9A212 Injury, poisoning and certain other consequences of external causes complicating pregnancy, second trimester: Secondary | ICD-10-CM | POA: Diagnosis not present

## 2015-11-21 DIAGNOSIS — Y998 Other external cause status: Secondary | ICD-10-CM | POA: Insufficient documentation

## 2015-11-21 DIAGNOSIS — Z79899 Other long term (current) drug therapy: Secondary | ICD-10-CM | POA: Insufficient documentation

## 2015-11-21 DIAGNOSIS — O99342 Other mental disorders complicating pregnancy, second trimester: Secondary | ICD-10-CM | POA: Diagnosis present

## 2015-11-21 DIAGNOSIS — O99512 Diseases of the respiratory system complicating pregnancy, second trimester: Secondary | ICD-10-CM | POA: Diagnosis not present

## 2015-11-21 DIAGNOSIS — X788XXA Intentional self-harm by other sharp object, initial encounter: Secondary | ICD-10-CM | POA: Diagnosis not present

## 2015-11-21 DIAGNOSIS — Y9289 Other specified places as the place of occurrence of the external cause: Secondary | ICD-10-CM | POA: Insufficient documentation

## 2015-11-21 LAB — RAPID URINE DRUG SCREEN, HOSP PERFORMED
Amphetamines: NOT DETECTED
Barbiturates: NOT DETECTED
Benzodiazepines: NOT DETECTED
Cocaine: NOT DETECTED
Opiates: NOT DETECTED
Tetrahydrocannabinol: NOT DETECTED

## 2015-11-21 LAB — COMPREHENSIVE METABOLIC PANEL
ALT: 17 U/L (ref 14–54)
AST: 21 U/L (ref 15–41)
Albumin: 3.3 g/dL — ABNORMAL LOW (ref 3.5–5.0)
Alkaline Phosphatase: 58 U/L (ref 38–126)
Anion gap: 8 (ref 5–15)
BUN: 16 mg/dL (ref 6–20)
CO2: 20 mmol/L — ABNORMAL LOW (ref 22–32)
Calcium: 8.9 mg/dL (ref 8.9–10.3)
Chloride: 108 mmol/L (ref 101–111)
Creatinine, Ser: 0.53 mg/dL (ref 0.44–1.00)
GFR calc Af Amer: >60 mL/min (ref >60)
GFR calc non Af Amer: >60 mL/min (ref >60)
Glucose, Bld: 72 mg/dL (ref 65–99)
Potassium: 3.7 mmol/L (ref 3.5–5.1)
Sodium: 136 mmol/L (ref 135–145)
Total Bilirubin: 1 mg/dL (ref 0.3–1.2)
Total Protein: 7.4 g/dL (ref 6.5–8.1)

## 2015-11-21 LAB — CBC
HCT: 33.4 % — ABNORMAL LOW (ref 36.0–46.0)
Hemoglobin: 10.8 g/dL — ABNORMAL LOW (ref 12.0–15.0)
MCH: 28.3 pg (ref 26.0–34.0)
MCHC: 32.3 g/dL (ref 30.0–36.0)
MCV: 87.7 fL (ref 78.0–100.0)
Platelets: 296 10*3/uL (ref 150–400)
RBC: 3.81 MIL/uL — ABNORMAL LOW (ref 3.87–5.11)
RDW: 14 % (ref 11.5–15.5)
WBC: 9.8 10*3/uL (ref 4.0–10.5)

## 2015-11-21 LAB — SALICYLATE LEVEL: Salicylate Lvl: <4 mg/dL (ref 2.8–30.0)

## 2015-11-21 LAB — ACETAMINOPHEN LEVEL: Acetaminophen (Tylenol), Serum: <10 ug/mL — ABNORMAL LOW (ref 10–30)

## 2015-11-21 LAB — ETHANOL: Alcohol, Ethyl (B): <5 mg/dL (ref <5)

## 2015-11-21 MED ORDER — PRENATAL VITAMINS 0.8 MG PO TABS: 1.0000 | ORAL_TABLET | Freq: Every day | ORAL | Status: DC

## 2015-11-21 MED ORDER — PRENATAL MULTIVITAMIN CH
1.0000 | ORAL_TABLET | Freq: Every day | ORAL | Status: DC
  Administered 2015-11-22: 1 via ORAL
  Filled 2015-11-21: qty 1

## 2015-11-21 NOTE — ED Notes (Signed)
On admission to the [facility] pt denies SI/HI. She said that she and her boyfriend broke up and he is not the father of her baby. Her family also kicked her out of the house and she is living with a friend. Her affect is flat and she is depressed.

## 2015-11-21 NOTE — ED Notes (Signed)
Patient noted sleeping in room. No complaints, stable, in no acute distress. Q15 minute rounds and monitoring via Security Cameras to continue.  

## 2015-11-21 NOTE — ED Notes (Signed)
Report received from Diane RN. Patient alert and oriented, warm and dry, in no acute distress. Patient denies SI, HI, AVH and pain. Patient made aware of Q15 minute rounds and security cameras for their safety. Patient instructed to come to me with needs or concerns. 

## 2015-11-21 NOTE — BH Assessment (Addendum)
Assessment Note  Kristin Bruce is an 20 y.o. female. She presents to [facility] stating that her boyfriend broke up with her. Patient is [redacted] weeks pregnant. Sts that her boyfriend became upset after she told him that he is not the father of her baby. Sts that she doesn't want anything to do with the biological father of her baby. Patient is tearful and sts that the past several days have been very difficult for her. Patient was brought to ED by her now ex boyfriends mother. Sts she doesn't have anywhere to go because her mother kicked her out of the home yesterday. She is living with a friend. Patient reports that she is very depressed and sad. She has no support. Patient works and [employer]. She is currently attending school at [employer]   Patient denies current SI. She admits to suicidal thoughts 2-3 days ago. She also made superficial cuts to her wrist. Patient sts, "I never did that before and I am not sure why I did it". Patient admits that she is depressed but sts that she does not want to harm herself. Patient denies HI and AVH's. She does not have any outpatient psychiatric providers. She also denies a history of INPT hospitalizations.   Diagnosis: Major Depressive Disorder, Single Episode, without psychotic features  Past Medical History:  Past Medical History  Diagnosis Date  . Asthma     Past Surgical History  Procedure Laterality Date  . No past surgeries      Family History: History reviewed. No pertinent family history.  Social History:  reports that she has never smoked. She has never used smokeless tobacco. She reports that she does not drink alcohol or use illicit drugs.  Additional Social History:  Alcohol / Drug Use Pain Medications: SEE MAR Prescriptions: SEE MAR Over the Counter: SEE MAR History of alcohol / drug use?: No history of alcohol / drug abuse  CIWA: CIWA-Ar BP: 110/74 mmHg Pulse Rate: 96 COWS:    Allergies: No Known Allergies  Home Medications:  (Not in  a hospital admission)  OB/GYN Status:  Patient's last menstrual period was 07/01/2015.  General Assessment Data Location of Assessment: [facility] TTS Assessment: In system Is this a Tele or Face-to-Face Assessment?: Face-to-Face Is this an Initial Assessment or a Re-assessment for this encounter?: Initial Assessment Marital status: Single Maiden name:  (n/a) Is patient pregnant?: No Pregnancy Status: No Living Arrangements: Other (Comment) (kicked out of mothers home yesterday;homeless) Can pt return to current living arrangement?: No Admission Status: Voluntary Is patient capable of signing voluntary admission?: Yes Referral Source: Self/Family/Friend Insurance type:  (Medicaid)  Medical Screening Exam [facility] Walk-in ONLY) Medical Exam completed: No Reason for MSE not completed:  (n/a)  Crisis Care Plan Living Arrangements: Other (Comment) (kicked out of mothers home yesterday;homeless) Legal Guardian: Other: (no legal guardian ) Name of Psychiatrist:  (no psychiatrist ) Name of Therapist:  (no therapist )  Education Status Is patient currently in school?: Yes ([employer]) Current Grade:  (community college) Highest grade of school patient has completed:  (HS) Name of school:  ([employer])  Risk to self with the past 6 months Suicidal Ideation: No-Not Currently/Within Last 6 Months Has patient been a risk to self within the past 6 months prior to admission? : Yes Suicidal Intent: No-Not Currently/Within Last 6 Months Has patient had any suicidal intent within the past 6 months prior to admission? : No Is patient at risk for suicide?: No Suicidal Plan?: No-Not Currently/Within Last 6  Months Has patient had any suicidal plan within the past 6 months prior to admission? : Yes Access to Means: Yes Specify Access to Suicidal Means:  (sharp objects) What has been your use of drugs/alcohol within the last 12 months?:  (patient denies ) Previous Attempts/Gestures: No How many times?:   (n/a) Other Self Harm Risks:  (denies prior history of cutting or any other self mutilation) Triggers for Past Attempts:  (n/a) Intentional Self Injurious Behavior: None Family Suicide History: Unknown Recent stressful life event(s): Other (Comment) (pregnant by a someone other than boyfriend;kicked out of hom) Persecutory voices/beliefs?: No Depression: Yes Depression Symptoms: Feeling angry/irritable, Feeling worthless/self pity, Loss of interest in usual pleasures, Guilt, Isolating, Fatigue, Tearfulness, Insomnia, Despondent Substance abuse history and/or treatment for substance abuse?: No Suicide prevention information given to non-admitted patients: Not applicable  Risk to Others within the past 6 months Homicidal Ideation: No Does patient have any lifetime risk of violence toward others beyond the six months prior to admission? : No Thoughts of Harm to Others: No Current Homicidal Intent: No Current Homicidal Plan: No Access to Homicidal Means: No Identified Victim:  (n/a) History of harm to others?: No Assessment of Violence: None Noted Violent Behavior Description:  (patient is calm and cooperative ) Does patient have access to weapons?: No Criminal Charges Pending?: No Does patient have a court date: No Is patient on probation?: No  Psychosis Hallucinations: None noted Delusions: None noted  Mental Status Report Appearance/Hygiene: Disheveled, In scrubs Eye Contact: Good Motor Activity: Freedom of movement Speech: Logical/coherent Level of Consciousness: Alert Mood: Depressed Affect: Appropriate to circumstance Anxiety Level: Severe Thought Processes: Relevant Judgement: Impaired Orientation: Person, Place, Time, Situation Obsessive Compulsive Thoughts/Behaviors: None  Cognitive Functioning Concentration: Decreased Memory: Recent Intact, Remote Intact IQ: Average Insight: Fair Impulse Control: Fair Appetite: Good Weight Loss:  (none reported) Weight  Gain:  (none reported) Sleep: No Change Total Hours of Sleep:  (varies ) Vegetative Symptoms: None  ADLScreening [facility] Assessment Services) Patient's cognitive ability adequate to safely complete daily activities?: Yes Patient able to express need for assistance with ADLs?: Yes Independently performs ADLs?: Yes (appropriate for developmental age)  Prior Inpatient Therapy Prior Inpatient Therapy: No Prior Therapy Dates:  (n/a) Prior Therapy Facilty/Provider(s):  (n/a) Reason for Treatment:  (n/a)  Prior Outpatient Therapy Prior Outpatient Therapy: No Prior Therapy Dates:  (n/a) Prior Therapy Facilty/Provider(s):  (n/a) Reason for Treatment:  (n/a) Does patient have an ACCT team?: No Does patient have Intensive In-House Services?  : No Does patient have [facility] services? : No Does patient have P4CC services?: No  ADL Screening (condition at time of admission) Patient's cognitive ability adequate to safely complete daily activities?: Yes Is the patient deaf or have difficulty hearing?: No Does the patient have difficulty seeing, even when wearing glasses/contacts?: No Does the patient have difficulty concentrating, remembering, or making decisions?: No Patient able to express need for assistance with ADLs?: Yes Does the patient have difficulty dressing or bathing?: No Independently performs ADLs?: Yes (appropriate for developmental age) Does the patient have difficulty walking or climbing stairs?: No Weakness of Legs: None Weakness of Arms/Hands: None  Home Assistive Devices/Equipment Home Assistive Devices/Equipment: None    Abuse/Neglect Assessment (Assessment to be complete while patient is alone) Physical Abuse: Denies Verbal Abuse: Denies Sexual Abuse: Denies Exploitation of patient/patient's resources: Denies Self-Neglect: Denies Values / Beliefs Cultural Requests During Hospitalization: None Spiritual Requests During Hospitalization: None   Advance Directives  (For Healthcare) Does patient have an advance  directive?: No Would patient like information on creating an advanced directive?: No - patient declined information Nutrition Screen- [facility] Patient's home diet: Regular  Additional Information 1:1 In Past 12 Months?: Yes CIRT Risk: No Elopement Risk: No Does patient have medical clearance?: Yes     Disposition:  Disposition Initial Assessment Completed for this Encounter: Yes (Pending am psych evaluation ) Disposition of Patient:  (Per Nanine Means, DNP patient to remain in the ED overnight)  On Site Evaluation by:   Reviewed with Physician:    Melynda Ripple [facility] 11/21/2015 7:09 PM

## 2015-11-21 NOTE — ED Notes (Signed)
Patient noted in room. No complaints, stable, in no acute distress. Q15 minute rounds and monitoring via Security Cameras to continue.  

## 2015-11-21 NOTE — ED Provider Notes (Signed)
CSN: [id number]     Arrival date & time 11/21/15  1715 History   First MD Initiated Contact with Patient 11/21/15 1736     Chief Complaint  Patient presents with  . Suicide Attempt  . Psychiatric Evaluation     (Consider location/radiation/quality/duration/timing/severity/associated sxs/prior Treatment) HPI   20 year old female with increasing depression. Patient was recently kicked out of her mother's home after they got in argument. She is currently staying with a friend. Last night she made an attempt to cut her wrists. She does have some superficial abrasions. She has approximately [redacted] weeks pregnant. She reports significant stressors with regards to this. This is her first pregnancy. It wasn't planned and father is not someone she would necessarily want to be the father of her child. She feels like she has little support. No previously diagnosed psychiatric illness. She denies any alcohol or drug use. She is brought in today by mobile crisis.  Past Medical History  Diagnosis Date  . Asthma    Past Surgical History  Procedure Laterality Date  . No past surgeries     History reviewed. No pertinent family history. Social History  Substance Use Topics  . Smoking status: Never Smoker   . Smokeless tobacco: Never Used  . Alcohol Use: No   OB History    Gravida Para Term Preterm AB TAB SAB Ectopic Multiple Living   1              Review of Systems  All systems reviewed and negative, other than as noted in HPI.   Allergies  Review of patient's allergies indicates no known allergies.  Home Medications   Prior to Admission medications   Medication Sig Start Date End Date Taking? Authorizing Provider  acetaminophen (TYLENOL) 500 MG tablet Take 1,000 mg by mouth every 6 (six) hours as needed for mild pain or headache.    Historical Provider, MD  albuterol (PROVENTIL HFA;VENTOLIN HFA) 108 (90 BASE) MCG/ACT inhaler Inhale 2 puffs into the lungs every 6 (six) hours as needed. For  shortness of breath    Historical Provider, MD  lidocaine (XYLOCAINE) 5 % ointment Apply 1 application topically 4 (four) times daily as needed. 09/18/15   Lori A Clemmons, CNM  oxyCODONE-acetaminophen (PERCOCET/ROXICET) 5-325 MG tablet Take 1 tablet by mouth every 4 (four) hours as needed for severe pain. 09/18/15   Lori A Clemmons, CNM  Prenatal Multivit-Min-Fe-FA (PRENATAL VITAMINS) 0.8 MG tablet Take 1 tablet by mouth daily. 08/08/15   Willodean Rosenthal, MD   BP 110/74 mmHg  Pulse 96  Temp(Src) 97.8 F (36.6 C) (Oral)  Resp 16  SpO2 97%  LMP 07/01/2015 Physical Exam  Constitutional: She appears well-developed and well-nourished. No distress.  HENT:  Head: Normocephalic and atraumatic.  Eyes: Conjunctivae are normal. Right eye exhibits no discharge. Left eye exhibits no discharge.  Neck: Neck supple.  Cardiovascular: Normal rate, regular rhythm and normal heart sounds.  Exam reveals no gallop and no friction rub.   No murmur heard. Pulmonary/Chest: Effort normal and breath sounds normal. No respiratory distress.  Abdominal: Soft. She exhibits no distension. There is no tenderness.  Gravid nontender abdomen.  Musculoskeletal: She exhibits no edema or tenderness.  Neurological: She is alert.  Column. Cooperative. Speech is clear. Content is appropriate. Affect is flat. Crying at times.  Skin: Skin is warm and dry.  Psychiatric: She has a normal mood and affect. Her behavior is normal. Thought content normal.  Nursing note and vitals reviewed.   ED  Course  Procedures (including critical care time) Labs Review Labs Reviewed  COMPREHENSIVE METABOLIC PANEL  ETHANOL  SALICYLATE LEVEL  ACETAMINOPHEN LEVEL  CBC  URINE RAPID DRUG SCREEN, HOSP PERFORMED    Imaging Review No results found. I have personally reviewed and evaluated these images and lab results as part of my medical decision-making.   EKG Interpretation None      MDM   Final diagnoses:  Depression   Pregnancy    20 year old female with increasing depression and suicidal gesture yesterday. Non-concerning abrasions to volar aspect of the left wrist. She is [redacted] weeks pregnant. First pregnancy. She denies any significant issues with this pregnancy aside from social concerns. She has been getting prenatal care. Will obtain some basic screening labs but do not anticipate any significant abnormalities. TTS evaluation.   Raeford RazorStephen Boyd Litaker, MD 11/21/15 [phone]

## 2015-11-21 NOTE — ED Notes (Signed)
Pt B[facility] for attempting to suicide via laceration to left wrist and voicing SI. Pt arrives with superficial laceration to left wrist. Pt A+OX4, soft spoken and speaking in complete sentences.

## 2015-11-22 DIAGNOSIS — F4321 Adjustment disorder with depressed mood: Secondary | ICD-10-CM | POA: Diagnosis present

## 2015-11-22 HISTORY — DX: Adjustment disorder with depressed mood: F43.21

## 2015-11-22 NOTE — ED Notes (Signed)
On the phone 

## 2015-11-22 NOTE — ED Notes (Signed)
Patient noted sleeping in room. No complaints, stable, in no acute distress. Q15 minute rounds and monitoring via Security Cameras to continue.  

## 2015-11-22 NOTE — BH Assessment (Signed)
[facility] Assessment Progress Note This Clinical research associatewriter spoke with patient's roommate "Raynelle FanningJulie [phone]" to gather collateral information in reference to patient's history. Roommate stated she has resided with patient for over one year and stated she has not noticed anything "unusual" with patient. Roommate reports she has a very good relationship with patient and considers her a close friend.

## 2015-11-22 NOTE — ED Notes (Addendum)
Written dc instructions reviewed w/ pt.  Pt encouraged to follow up w/ provider.  Pt denies si/hi/avh on dc, and was encouraged to seek treatment if thoughts/urges returned.  Pt verbalized understanding.

## 2015-11-22 NOTE — ED Notes (Signed)
Up at the phone

## 2015-11-22 NOTE — Consult Note (Signed)
[facility] Consult   Reason for Consult:  Suicidal ideations Referring Physician:  EDP Patient Identification: Kristin Bruce MRN:  [id number] Principal Diagnosis: Adjustment disorder with depressed mood Diagnosis:   Patient Active Problem List   Diagnosis Date Noted  . Adjustment disorder with depressed mood [F43.21] 11/22/2015    Priority: High  . Boil of buttock [L02.32] 09/18/2015    Total Time spent with patient: 45 minutes  Subjective:   Kristin Bruce is a 20 y.o. female patient does not warrant admission.  HPI:  20 yo female who got upset yesterday and started having suicidal ideations, [facility] brought her to the ED.  Today, she denies these thoughts and wants to discharge.  She reports being stressed with her mother and break-up with her boyfriend.  Two days ago, she made three scratches to her left wrist but denies any suicidal ideations or plan today, no past history.  Agreeable to go to outpatient therapy and gave the number to her best friend for safety collateral since she lives with her.  Her friend, Gregary Signs, had no safety concerns.  No homicidal ideations, hallucinations, and alcohol/drug abuse.  She is currently 5 months pregnant and wants the baby.  Past Psychiatric History: None  Risk to Self: Suicidal Ideation: No-Not Currently/Within Last 6 Months Suicidal Intent: No-Not Currently/Within Last 6 Months Is patient at risk for suicide?: No Suicidal Plan?: No-Not Currently/Within Last 6 Months Access to Means: Yes Specify Access to Suicidal Means:  (sharp objects) What has been your use of drugs/alcohol within the last 12 months?:  (patient denies ) How many times?:  (n/a) Other Self Harm Risks:  (denies prior history of cutting or any other self mutilation) Triggers for Past Attempts:  (n/a) Intentional Self Injurious Behavior: None Risk to Others: Homicidal Ideation: No Thoughts of Harm to Others: No Current Homicidal Intent: No Current  Homicidal Plan: No Access to Homicidal Means: No Identified Victim:  (n/a) History of harm to others?: No Assessment of Violence: None Noted Violent Behavior Description:  (patient is calm and cooperative ) Does patient have access to weapons?: No Criminal Charges Pending?: No Does patient have a court date: No Prior Inpatient Therapy: Prior Inpatient Therapy: No Prior Therapy Dates:  (n/a) Prior Therapy Facilty/Provider(s):  (n/a) Reason for Treatment:  (n/a) Prior Outpatient Therapy: Prior Outpatient Therapy: No Prior Therapy Dates:  (n/a) Prior Therapy Facilty/Provider(s):  (n/a) Reason for Treatment:  (n/a) Does patient have an ACCT team?: No Does patient have Intensive In-House Services?  : No Does patient have [facility] services? : No Does patient have P4CC services?: No  Past Medical History:  Past Medical History  Diagnosis Date  . Asthma     Past Surgical History  Procedure Laterality Date  . No past surgeries     Family History: History reviewed. No pertinent family history. Family Psychiatric  History: None Social History:  History  Alcohol Use No     History  Drug Use No    Social History   Social History  . Marital Status: Single    Spouse Name: N/A  . Number of Children: N/A  . Years of Education: N/A   Social History Main Topics  . Smoking status: Never Smoker   . Smokeless tobacco: Never Used  . Alcohol Use: No  . Drug Use: No  . Sexual Activity: Yes    Birth Control/ Protection: Pill   Other Topics Concern  . None   Social History Narrative   Additional  Social History:    Allergies:  No Known Allergies  Labs:  Results for orders placed or performed during the hospital encounter of 11/21/15 (from the past 48 hour(s))  Comprehensive metabolic panel     Status: Abnormal   Collection Time: 11/21/15  5:54 PM  Result Value Ref Range   Sodium 136 135 - 145 mmol/L   Potassium 3.7 3.5 - 5.1 mmol/L   Chloride 108 101 - 111 mmol/L   CO2 20  (L) 22 - 32 mmol/L   Glucose, Bld 72 65 - 99 mg/dL   BUN 16 6 - 20 mg/dL   Creatinine, Ser 0.53 0.44 - 1.00 mg/dL   Calcium 8.9 8.9 - 10.3 mg/dL   Total Protein 7.4 6.5 - 8.1 g/dL   Albumin 3.3 (L) 3.5 - 5.0 g/dL   AST 21 15 - 41 U/L   ALT 17 14 - 54 U/L   Alkaline Phosphatase 58 38 - 126 U/L   Total Bilirubin 1.0 0.3 - 1.2 mg/dL   GFR calc non Af Amer >60 >60 mL/min   GFR calc Af Amer >60 >60 mL/min    Comment: (NOTE) The eGFR has been calculated using the CKD EPI equation. This calculation has not been validated in all clinical situations. eGFR's persistently <60 mL/min signify possible Chronic Kidney Disease.    Anion gap 8 5 - 15  Ethanol     Status: None   Collection Time: 11/21/15  5:54 PM  Result Value Ref Range   Alcohol, Ethyl (B) <5 <5 mg/dL    Comment:        LOWEST DETECTABLE LIMIT FOR SERUM ALCOHOL IS 5 mg/dL FOR MEDICAL PURPOSES ONLY   Salicylate level     Status: None   Collection Time: 11/21/15  5:54 PM  Result Value Ref Range   Salicylate Lvl <0.1 2.8 - 30.0 mg/dL  Acetaminophen level     Status: Abnormal   Collection Time: 11/21/15  5:54 PM  Result Value Ref Range   Acetaminophen (Tylenol), Serum <10 (L) 10 - 30 ug/mL    Comment:        THERAPEUTIC CONCENTRATIONS VARY SIGNIFICANTLY. A RANGE OF 10-30 ug/mL MAY BE AN EFFECTIVE CONCENTRATION FOR MANY PATIENTS. HOWEVER, SOME ARE BEST TREATED AT CONCENTRATIONS OUTSIDE THIS RANGE. ACETAMINOPHEN CONCENTRATIONS >150 ug/mL AT 4 HOURS AFTER INGESTION AND >50 ug/mL AT 12 HOURS AFTER INGESTION ARE OFTEN ASSOCIATED WITH TOXIC REACTIONS.   cbc     Status: Abnormal   Collection Time: 11/21/15  5:54 PM  Result Value Ref Range   WBC 9.8 4.0 - 10.5 K/uL   RBC 3.81 (L) 3.87 - 5.11 MIL/uL   Hemoglobin 10.8 (L) 12.0 - 15.0 g/dL   HCT 33.4 (L) 36.0 - 46.0 %   MCV 87.7 78.0 - 100.0 fL   MCH 28.3 26.0 - 34.0 pg   MCHC 32.3 30.0 - 36.0 g/dL   RDW 14.0 11.5 - 15.5 %   Platelets 296 150 - 400 K/uL  Rapid urine  drug screen (hospital performed)     Status: None   Collection Time: 11/21/15  6:07 PM  Result Value Ref Range   Opiates NONE DETECTED NONE DETECTED   Cocaine NONE DETECTED NONE DETECTED   Benzodiazepines NONE DETECTED NONE DETECTED   Amphetamines NONE DETECTED NONE DETECTED   Tetrahydrocannabinol NONE DETECTED NONE DETECTED   Barbiturates NONE DETECTED NONE DETECTED    Comment:        DRUG SCREEN FOR MEDICAL PURPOSES ONLY.  IF CONFIRMATION  IS NEEDED FOR ANY PURPOSE, NOTIFY LAB WITHIN 5 DAYS.        LOWEST DETECTABLE LIMITS FOR URINE DRUG SCREEN Drug Class       Cutoff (ng/mL) Amphetamine      1000 Barbiturate      200 Benzodiazepine   562 Tricyclics       130 Opiates          300 Cocaine          300 THC              50     Current Facility-Administered Medications  Medication Dose Route Frequency Provider Last Rate Last Dose  . prenatal multivitamin tablet 1 tablet  1 tablet Oral Q1200 Virgel Manifold, MD       No current outpatient prescriptions on file.    Musculoskeletal: Strength & Muscle Tone: within normal limits Gait & Station: normal Patient leans: N/A  Psychiatric Specialty Exam: Review of Systems  Constitutional: Negative.   HENT: Negative.   Eyes: Negative.   Respiratory: Negative.   Cardiovascular: Negative.   Gastrointestinal: Negative.   Genitourinary: Negative.   Musculoskeletal: Negative.   Skin: Negative.   Neurological: Negative.   Endo/Heme/Allergies: Negative.   Psychiatric/Behavioral: Positive for depression.    Blood pressure 99/57, pulse 86, temperature 97.7 F (36.5 C), temperature source Oral, resp. rate 14, last menstrual period 07/01/2015, SpO2 100 %.There is no weight on file to calculate BMI.  General Appearance: Casual  Eye Contact::  Good  Speech:  Normal Rate  Volume:  Normal  Mood:  Depressed, mild  Affect:  Congruent  Thought Process:  Coherent  Orientation:  Full (Time, Place, and Person)  Thought Content:  Rumination   Suicidal Thoughts:  No  Homicidal Thoughts:  No  Memory:  Immediate;   Good Recent;   Good Remote;   Good  Judgement:  Good  Insight:  Good  Psychomotor Activity:  Normal  Concentration:  Good  Recall:  Good  Fund of Knowledge:Good  Language: Good  Akathisia:  No  Handed:  Right  AIMS (if indicated):     Assets:  Housing Leisure Time Physical Health Resilience Social Support  ADL's:  Intact  Cognition: WNL  Sleep:      Treatment Plan Summary: Daily contact with patient to assess and evaluate symptoms and progress in treatment, Medication management and Plan adjustment disorder with depressed mood  -Crisis stabilization -Medication management:  None due to pregnancy -Individual counseling  Disposition: No evidence of imminent risk to self or others at present.    Waylan Boga, NP 11/22/2015 11:43 AM  Patient seen face-to-face for psychiatric consultation and evaluation, reviewed available medical records and case discussed with the treatment team and physician extender. Formulated treatment plan. Reviewed the information documented and agree with the treatment plan.   Verdell Dykman,JANARDHAHA R. 11/22/2015 4:13 PM Shavon Zenz,JANARDHAHA R. 11/22/2015 4:13 PM

## 2015-11-22 NOTE — [facility] Suicide Risk Assessment (Signed)
Suicide Risk Assessment  Discharge Assessment   [facility]   Principal Problem: Adjustment disorder with depressed mood Discharge Diagnoses:  Patient Active Problem List   Diagnosis Date Noted  . Adjustment disorder with depressed mood [F43.21] 11/22/2015    Priority: High  . Boil of buttock [L02.32] 09/18/2015    Total Time spent with patient: 45 minutes  Musculoskeletal: Strength & Muscle Tone: within normal limits Gait & Station: normal Patient leans: N/A  Psychiatric Specialty Exam: Review of Systems  Constitutional: Negative.   HENT: Negative.   Eyes: Negative.   Respiratory: Negative.   Cardiovascular: Negative.   Gastrointestinal: Negative.   Genitourinary: Negative.   Musculoskeletal: Negative.   Skin: Negative.   Neurological: Negative.   Endo/Heme/Allergies: Negative.   Psychiatric/Behavioral: Positive for depression.    Blood pressure 99/57, pulse 86, temperature 97.7 F (36.5 C), temperature source Oral, resp. rate 14, last menstrual period 07/01/2015, SpO2 100 %.There is no weight on file to calculate BMI.  General Appearance: Casual  Eye Contact::  Good  Speech:  Normal Rate  Volume:  Normal  Mood:  Depressed, mild  Affect:  Congruent  Thought Process:  Coherent  Orientation:  Full (Time, Place, and Person)  Thought Content:  Rumination  Suicidal Thoughts:  No  Homicidal Thoughts:  No  Memory:  Immediate;   Good Recent;   Good Remote;   Good  Judgement:  Good  Insight:  Good  Psychomotor Activity:  Normal  Concentration:  Good  Recall:  Good  Fund of Knowledge:Good  Language: Good  Akathisia:  No  Handed:  Right  AIMS (if indicated):     Assets:  Housing Leisure Time Physical Health Resilience Social Support  ADL's:  Intact  Cognition: WNL  Sleep:      Mental Status Per Nursing Assessment::   On Admission:   suicidal ideations  Demographic Factors:  Adolescent or young adult  Loss  Factors: NA  Historical Factors: NA  Risk Reduction Factors:   Pregnancy, Sense of responsibility to family, Employed, Living with another person, especially a relative and Positive social support  Continued Clinical Symptoms:  Depression, mild  Cognitive Features That Contribute To Risk:  None    Suicide Risk:  Minimal: No identifiable suicidal ideation.  Patients presenting with no risk factors but with morbid ruminations; may be classified as minimal risk based on the severity of the depressive symptoms    Plan Of Care/Follow-up recommendations:  Activity:  as tolerated Diet:  heart healthy diet  Teya Otterson, NP 11/22/2015, 1:15 PM

## 2016-01-07 ENCOUNTER — Inpatient Hospital Stay (HOSPITAL_COMMUNITY)
Admission: AD | Admit: 2016-01-07 | Discharge: 2016-01-08 | Disposition: A | Discharge status: Home / Self Care | Payer: Medicaid Other | Source: Ambulatory Visit | Attending: Obstetrics and Gynecology | Admitting: Obstetrics and Gynecology

## 2016-01-07 ENCOUNTER — Encounter (HOSPITAL_COMMUNITY): Payer: Self-pay

## 2016-01-07 DIAGNOSIS — O99513 Diseases of the respiratory system complicating pregnancy, third trimester: Secondary | ICD-10-CM | POA: Diagnosis not present

## 2016-01-07 DIAGNOSIS — J45909 Unspecified asthma, uncomplicated: Secondary | ICD-10-CM | POA: Diagnosis not present

## 2016-01-07 DIAGNOSIS — R0789 Other chest pain: Secondary | ICD-10-CM

## 2016-01-07 DIAGNOSIS — Z3A27 27 weeks gestation of pregnancy: Secondary | ICD-10-CM | POA: Diagnosis not present

## 2016-01-07 DIAGNOSIS — F4321 Adjustment disorder with depressed mood: Secondary | ICD-10-CM | POA: Diagnosis not present

## 2016-01-07 DIAGNOSIS — O99343 Other mental disorders complicating pregnancy, third trimester: Secondary | ICD-10-CM | POA: Insufficient documentation

## 2016-01-07 DIAGNOSIS — O99019 Anemia complicating pregnancy, unspecified trimester: Secondary | ICD-10-CM

## 2016-01-07 DIAGNOSIS — R0781 Pleurodynia: Secondary | ICD-10-CM | POA: Diagnosis not present

## 2016-01-07 DIAGNOSIS — O26893 Other specified pregnancy related conditions, third trimester: Secondary | ICD-10-CM | POA: Diagnosis not present

## 2016-01-07 DIAGNOSIS — O99012 Anemia complicating pregnancy, second trimester: Secondary | ICD-10-CM

## 2016-01-07 DIAGNOSIS — O99013 Anemia complicating pregnancy, third trimester: Secondary | ICD-10-CM | POA: Diagnosis not present

## 2016-01-07 DIAGNOSIS — R1011 Right upper quadrant pain: Secondary | ICD-10-CM | POA: Diagnosis present

## 2016-01-07 LAB — COMPREHENSIVE METABOLIC PANEL
ALT: 26 U/L (ref 14–54)
AST: 19 U/L (ref 15–41)
Albumin: 2.9 g/dL — ABNORMAL LOW (ref 3.5–5.0)
Alkaline Phosphatase: 73 U/L (ref 38–126)
Anion gap: 6 (ref 5–15)
BILIRUBIN TOTAL: 1.1 mg/dL (ref 0.3–1.2)
BUN: 15 mg/dL (ref 6–20)
CHLORIDE: 105 mmol/L (ref 101–111)
CO2: 23 mmol/L (ref 22–32)
Calcium: 8.7 mg/dL — ABNORMAL LOW (ref 8.9–10.3)
Creatinine, Ser: 0.46 mg/dL (ref 0.44–1.00)
GFR calc Af Amer: >60 mL/min (ref >60)
GLUCOSE: 76 mg/dL (ref 65–99)
POTASSIUM: 4.1 mmol/L (ref 3.5–5.1)
Sodium: 134 mmol/L — ABNORMAL LOW (ref 135–145)
TOTAL PROTEIN: 7.3 g/dL (ref 6.5–8.1)

## 2016-01-07 LAB — CBC
HEMATOCRIT: 30 % — AB (ref 36.0–46.0)
Hemoglobin: 9.5 g/dL — ABNORMAL LOW (ref 12.0–15.0)
MCH: 27.6 pg (ref 26.0–34.0)
MCHC: 31.7 g/dL (ref 30.0–36.0)
MCV: 87.2 fL (ref 78.0–100.0)
PLATELETS: 272 10*3/uL (ref 150–400)
RBC: 3.44 MIL/uL — ABNORMAL LOW (ref 3.87–5.11)
RDW: 14.2 % (ref 11.5–15.5)
WBC: 10 10*3/uL (ref 4.0–10.5)

## 2016-01-07 LAB — AMYLASE: Amylase: 139 U/L — ABNORMAL HIGH (ref 28–100)

## 2016-01-07 LAB — URINALYSIS, ROUTINE W REFLEX MICROSCOPIC
BILIRUBIN URINE: NEGATIVE
GLUCOSE, UA: NEGATIVE mg/dL
HGB URINE DIPSTICK: NEGATIVE
Ketones, ur: NEGATIVE mg/dL
Nitrite: NEGATIVE
PH: 6.5 (ref 5.0–8.0)
Protein, ur: NEGATIVE mg/dL
SPECIFIC GRAVITY, URINE: 1.015 (ref 1.005–1.030)

## 2016-01-07 LAB — URINE MICROSCOPIC-ADD ON

## 2016-01-07 LAB — LIPASE, BLOOD: Lipase: 21 U/L (ref 11–51)

## 2016-01-07 MED ORDER — FERROUS SULFATE 325 (65 FE) MG PO TABS: 325.0000 mg | ORAL_TABLET | Freq: Two times a day (BID) | ORAL | Status: DC

## 2016-01-07 MED ORDER — IBUPROFEN 800 MG PO TABS
800.0000 mg | ORAL_TABLET | Freq: Once | ORAL | Status: AC
  Administered 2016-01-07: 800 mg via ORAL
  Filled 2016-01-07: qty 1

## 2016-01-07 NOTE — MAU Note (Signed)
Notified provider that patient is here.   

## 2016-01-07 NOTE — Discharge Instructions (Signed)
Pregnancy and Anemia Anemia is a condition in which the concentration of red blood cells or hemoglobin in the blood is below normal. Hemoglobin is a substance in red blood cells that carries oxygen to the tissues of the body. Anemia results in not enough oxygen reaching these tissues.  Anemia during pregnancy is common because the fetus uses more iron and folic acid as it is developing. Your body may not produce enough red blood cells because of this. Also, during pregnancy, the liquid part of the blood (plasma) increases by about 50%, and the red blood cells increase by only 25%. This lowers the concentration of the red blood cells and creates a natural anemia-like situation.  CAUSES  The most common cause of anemia during pregnancy is not having enough iron in the body to make red blood cells (iron deficiency anemia). Other causes may include:  Folic acid deficiency.  Vitamin B12 deficiency.  Certain prescription or over-the-counter medicines.  Certain medical conditions or infections that destroy red blood cells.  A low platelet count and bleeding caused by antibodies that go through the placenta to the fetus from the mother's blood. SIGNS AND SYMPTOMS  Mild anemia may not be noticeable. If it becomes severe, symptoms may include:  Tiredness.  Shortness of breath, especially with exercise.  Weakness.  Fainting.  Pale looking skin.  Headaches.  Feeling a fast or irregular heartbeat (palpitations). DIAGNOSIS  The type of anemia is usually diagnosed from your family and medical history and blood tests. TREATMENT  Treatment of anemia during pregnancy depends on the cause of the anemia. Treatment can include:  Supplements of iron, vitamin B12, or folic acid.  A blood transfusion. This may be needed if blood loss is severe.  Hospitalization. This may be needed if there is significant continual blood loss.  Dietary changes. HOME CARE INSTRUCTIONS   Follow your dietitian's or  health care provider's dietary recommendations.  Increase your vitamin C intake. This will help the stomach absorb more iron.  Eat a diet rich in iron. This would include foods such as:  Liver.  Beef.  Whole grain bread.  Eggs.  Dried fruit.  Take iron and vitamins as directed by your health care provider.  Eat green leafy vegetables. These are a good source of folic acid. SEEK MEDICAL CARE IF:   You have frequent or lasting headaches.  You are looking pale.  You are bruising easily. SEEK IMMEDIATE MEDICAL CARE IF:   You have extreme weakness, shortness of breath, or chest pain.  You become dizzy or have trouble concentrating.  You have heavy vaginal bleeding.  You develop a rash.  You have bloody or black, tarry stools.  You faint.  You vomit up blood.  You vomit repeatedly.  You have abdominal pain.  You have a fever or persistent symptoms for more than 2-3 days.  You have a fever and your symptoms suddenly get worse.  You are dehydrated. MAKE SURE YOU:   Understand these instructions.  Will watch your condition.  Will get help right away if you are not doing well or get worse.   This information is not intended to replace advice given to you by your health care provider. Make sure you discuss any questions you have with your health care provider.   Document Released: 07/02/2000 Document Revised: 04/25/2013 Document Reviewed: 02/14/2013 Elsevier Interactive Patient Education 2016 [employer].   Second Trimester of Pregnancy The second trimester is from week 13 through week 28, month 4 through  6. This is often the time in pregnancy that you feel your best. Often times, morning sickness has lessened or quit. You may have more energy, and you may get hungry more often. Your unborn baby (fetus) is growing rapidly. At the end of the sixth month, he or she is about 9 inches long and weighs about 1 pounds. You will likely feel the baby move (quickening)  between 18 and 20 weeks of pregnancy. HOME CARE   Avoid all smoking, herbs, and alcohol. Avoid drugs not approved by your doctor.  Do not use any tobacco products, including cigarettes, chewing tobacco, and electronic cigarettes. If you need help quitting, ask your doctor. You may get counseling or other support to help you quit.  Only take medicine as told by your doctor. Some medicines are safe and some are not during pregnancy.  Exercise only as told by your doctor. Stop exercising if you start having cramps.  Eat regular, healthy meals.  Wear a good support bra if your breasts are tender.  Do not use hot tubs, steam rooms, or saunas.  Wear your seat belt when driving.  Avoid raw meat, uncooked cheese, and liter boxes and soil used by cats.  Take your prenatal vitamins.  Take 1500-2000 milligrams of calcium daily starting at the 20th week of pregnancy until you deliver your baby.  Try taking medicine that helps you poop (stool softener) as needed, and if your doctor approves. Eat more fiber by eating fresh fruit, vegetables, and whole grains. Drink enough fluids to keep your pee (urine) clear or pale yellow.  Take warm water baths (sitz baths) to soothe pain or discomfort caused by hemorrhoids. Use hemorrhoid cream if your doctor approves.  If you have puffy, bulging veins (varicose veins), wear support hose. Raise (elevate) your feet for 15 minutes, 3-4 times a day. Limit salt in your diet.  Avoid heavy lifting, wear low heals, and sit up straight.  Rest with your legs raised if you have leg cramps or low back pain.  Visit your dentist if you have not gone during your pregnancy. Use a soft toothbrush to brush your teeth. Be gentle when you floss.  You can have sex (intercourse) unless your doctor tells you not to.  Go to your doctor visits. GET HELP IF:   You feel dizzy.  You have mild cramps or pressure in your lower belly (abdomen).  You have a nagging pain in your  belly area.  You continue to feel sick to your stomach (nauseous), throw up (vomit), or have watery poop (diarrhea).  You have bad smelling fluid coming from your vagina.  You have pain with peeing (urination). GET HELP RIGHT AWAY IF:   You have a fever.  You are leaking fluid from your vagina.  You have spotting or bleeding from your vagina.  You have severe belly cramping or pain.  You lose or gain weight rapidly.  You have trouble catching your breath and have chest pain.  You notice sudden or extreme puffiness (swelling) of your face, hands, ankles, feet, or legs.  You have not felt the baby move in over an hour.  You have severe headaches that do not go away with medicine.  You have vision changes.   This information is not intended to replace advice given to you by your health care provider. Make sure you discuss any questions you have with your health care provider.   Document Released: 09/29/2009 Document Revised: 07/26/2014 Document Reviewed: 09/05/2012 Elsevier Interactive  Patient Education [employer] Third Trimester of Pregnancy The third trimester is from week 29 through week 42, months 7 through 9. This trimester is when your unborn baby (fetus) is growing very fast. At the end of the ninth month, the unborn baby is about 20 inches in length. It weighs about 6-10 pounds.  HOME CARE   Avoid all smoking, herbs, and alcohol. Avoid drugs not approved by your doctor.  Do not use any tobacco products, including cigarettes, chewing tobacco, and electronic cigarettes. If you need help quitting, ask your doctor. You may get counseling or other support to help you quit.  Only take medicine as told by your doctor. Some medicines are safe and some are not during pregnancy.  Exercise only as told by your doctor. Stop exercising if you start having cramps.  Eat regular, healthy meals.  Wear a good support bra if your breasts are tender.  Do not use hot tubs,  steam rooms, or saunas.  Wear your seat belt when driving.  Avoid raw meat, uncooked cheese, and liter boxes and soil used by cats.  Take your prenatal vitamins.  Take 1500-2000 milligrams of calcium daily starting at the 20th week of pregnancy until you deliver your baby.  Try taking medicine that helps you poop (stool softener) as needed, and if your doctor approves. Eat more fiber by eating fresh fruit, vegetables, and whole grains. Drink enough fluids to keep your pee (urine) clear or pale yellow.  Take warm water baths (sitz baths) to soothe pain or discomfort caused by hemorrhoids. Use hemorrhoid cream if your doctor approves.  If you have puffy, bulging veins (varicose veins), wear support hose. Raise (elevate) your feet for 15 minutes, 3-4 times a day. Limit salt in your diet.  Avoid heavy lifting, wear low heels, and sit up straight.  Rest with your legs raised if you have leg cramps or low back pain.  Visit your dentist if you have not gone during your pregnancy. Use a soft toothbrush to brush your teeth. Be gentle when you floss.  You can have sex (intercourse) unless your doctor tells you not to.  Do not travel far distances unless you must. Only do so with your doctor's approval.  Take prenatal classes.  Practice driving to the hospital.  Pack your hospital bag.  Prepare the baby's room.  Go to your doctor visits. GET HELP IF:  You are not sure if you are in labor or if your water has broken.  You are dizzy.  You have mild cramps or pressure in your lower belly (abdominal).  You have a nagging pain in your belly area.  You continue to feel sick to your stomach (nauseous), throw up (vomit), or have watery poop (diarrhea).  You have bad smelling fluid coming from your vagina.  You have pain with peeing (urination). GET HELP RIGHT AWAY IF:   You have a fever.  You are leaking fluid from your vagina.  You are spotting or bleeding from your  vagina.  You have severe belly cramping or pain.  You lose or gain weight rapidly.  You have trouble catching your breath and have chest pain.  You notice sudden or extreme puffiness (swelling) of your face, hands, ankles, feet, or legs.  You have not felt the baby move in over an hour.  You have severe headaches that do not go away with medicine.  You have vision changes.   This information is not intended to replace advice given  to you by your health care provider. Make sure you discuss any questions you have with your health care provider.   Document Released: 09/29/2009 Document Revised: 07/26/2014 Document Reviewed: 09/05/2012 Elsevier Interactive Patient Education [employer] Inc[employer]

## 2016-01-07 NOTE — MAU Note (Signed)
Pt c/o RUQ pain. States that it started about 2 hours ago, but states that the pain comes every day. States that tylenol does not help. Denies N/V/D or constipation, LOF, vag bleeding or contractions. +FM.

## 2016-01-07 NOTE — MAU Provider Note (Signed)
Kristin Bruce is a 20 yo, G1P0, 27.1 wks  presenting to MAU unannounced for Right upper quadrant pain.   When asked about the pain, pt points to right ribs.   Reports pain that comes every days and started two hours ago today unrelieved by Tylenol. Rating pain 8/10. Pt is not distress, is talking on phone and to her guest and laughing.   Denies  Headache, visual disturbances, N/V/D, no constipation, lof, vag bleeding, or cramping/ctx, +Fm.   Pt last seen at [facility]  6/20 with complaints of similar rib pain.    History     Patient Active Problem List   Diagnosis Date Noted  . Adjustment disorder with depressed mood 11/22/2015  . Boil of buttock 09/18/2015    Chief Complaint  Patient presents with  . Abdominal Pain   HPI  OB History    Gravida Para Term Preterm AB TAB SAB Ectopic Multiple Living   1               Past Medical History  Diagnosis Date  . Asthma     Past Surgical History  Procedure Laterality Date  . No past surgeries       History reviewed. No pertinent family history.  Social History  Substance Use Topics  . Smoking status: Never Smoker   . Smokeless tobacco: Never Used  . Alcohol Use: No    Allergies: No Known Allergies  No prescriptions prior to admission    ROS Physical Exam   Blood pressure 108/68, pulse 91, temperature 98.7 F (37.1 C), temperature source Oral, resp. rate 16, height  (1.575 m), weight 57.153 kg (126 lb), last menstrual period 07/01/2015, SpO2 100 %.   Results for orders placed or performed during the hospital encounter of 01/07/16 (from the past 24 hour(s))  Urinalysis, Routine w reflex microscopic (not at [facility])     Status: Abnormal   Collection Time: 01/07/16 10:22 PM  Result Value Ref Range   Color, Urine YELLOW YELLOW   APPearance CLEAR CLEAR   Specific Gravity, Urine 1.015 1.005 - 1.030   pH 6.5 5.0 - 8.0   Glucose, UA NEGATIVE NEGATIVE mg/dL   Hgb urine dipstick NEGATIVE NEGATIVE   Bilirubin Urine NEGATIVE  NEGATIVE   Ketones, ur NEGATIVE NEGATIVE mg/dL   Protein, ur NEGATIVE NEGATIVE mg/dL   Nitrite NEGATIVE NEGATIVE   Leukocytes, UA MODERATE (A) NEGATIVE  Urine microscopic-add on     Status: Abnormal   Collection Time: 01/07/16 10:22 PM  Result Value Ref Range   Squamous Epithelial / LPF 0-5 (A) NONE SEEN   WBC, UA 0-5 0 - 5 WBC/hpf   RBC / HPF 0-5 0 - 5 RBC/hpf   Bacteria, UA FEW (A) NONE SEEN  CBC     Status: Abnormal   Collection Time: 01/07/16 10:52 PM  Result Value Ref Range   WBC 10.0 4.0 - 10.5 K/uL   RBC 3.44 (L) 3.87 - 5.11 MIL/uL   Hemoglobin 9.5 (L) 12.0 - 15.0 g/dL   HCT 16.1 (L) 09.6 - 04.5 %   MCV 87.2 78.0 - 100.0 fL   MCH 27.6 26.0 - 34.0 pg   MCHC 31.7 30.0 - 36.0 g/dL   RDW 40.9 81.1 - 91.4 %   Platelets 272 150 - 400 K/uL  Comprehensive metabolic panel     Status: Abnormal   Collection Time: 01/07/16 10:52 PM  Result Value Ref Range   Sodium 134 (L) 135 - 145 mmol/L   Potassium  4.1 3.5 - 5.1 mmol/L   Chloride 105 101 - 111 mmol/L   CO2 23 22 - 32 mmol/L   Glucose, Bld 76 65 - 99 mg/dL   BUN 15 6 - 20 mg/dL   Creatinine, Ser 1.610.46 0.44 - 1.00 mg/dL   Calcium 8.7 (L) 8.9 - 10.3 mg/dL   Total Protein 7.3 6.5 - 8.1 g/dL   Albumin 2.9 (L) 3.5 - 5.0 g/dL   AST 19 15 - 41 U/L   ALT 26 14 - 54 U/L   Alkaline Phosphatase 73 38 - 126 U/L   Total Bilirubin 1.1 0.3 - 1.2 mg/dL   GFR calc non Af Amer >60 >60 mL/min   GFR calc Af Amer >60 >60 mL/min   Anion gap 6 5 - 15  Lipase, blood     Status: None   Collection Time: 01/07/16 10:52 PM  Result Value Ref Range   Lipase 21 11 - 51 U/L  Amylase     Status: Abnormal   Collection Time: 01/07/16 10:52 PM  Result Value Ref Range   Amylase 139 (H) 28 - 100 U/L    FHT Reassuring FHT, 140 bpm, moderate variability, +accels  UC; minimal uterine irritability , not palpable  Physical Exam  ED Course  Assessment: IUP 27.1 wks Right rib pain Anemia, Hgb 9.5 Reassuring FHT   Plan: Ibuprofen, PRN DC home in  stable condition RX Fe  May take Ibuprofen for discomfort up to 28 wks Discussed common discomforts of pregnancy   Encouraged increased hydration Keep scheduled ROB/US appointment on 01/21/16 Call PRN  Alphonzo Severanceachel Annalaura Sauseda CNM, MSN 01/07/2016 11:02 PM

## 2016-03-04 ENCOUNTER — Observation Stay (HOSPITAL_COMMUNITY)
Admission: AD | Admit: 2016-03-04 | Discharge: 2016-03-05 | Disposition: A | Discharge status: Home / Self Care | Payer: Medicaid Other | Source: Ambulatory Visit | Attending: Obstetrics and Gynecology | Admitting: Obstetrics and Gynecology

## 2016-03-04 DIAGNOSIS — J45909 Unspecified asthma, uncomplicated: Secondary | ICD-10-CM | POA: Diagnosis not present

## 2016-03-04 DIAGNOSIS — O1400 Mild to moderate pre-eclampsia, unspecified trimester: Secondary | ICD-10-CM | POA: Diagnosis present

## 2016-03-04 DIAGNOSIS — Z3A35 35 weeks gestation of pregnancy: Secondary | ICD-10-CM | POA: Insufficient documentation

## 2016-03-04 DIAGNOSIS — O1493 Unspecified pre-eclampsia, third trimester: Principal | ICD-10-CM | POA: Insufficient documentation

## 2016-03-04 DIAGNOSIS — O1490 Unspecified pre-eclampsia, unspecified trimester: Secondary | ICD-10-CM

## 2016-03-04 LAB — OB RESULTS CONSOLE GBS: STREP GROUP B AG: POSITIVE

## 2016-03-04 NOTE — MAU Note (Signed)
Pt states Dr. Estanislado Pandyivard called her at 11PM and told her to come to the MAU for a preeclampsia work-up Pt denies HA and blurry vision but states she has left sided pain. Fetus active.

## 2016-03-04 NOTE — H&P (Signed)
Kristin Bruce is a 20 y.o. female, G1P0 at 35+2 weeks for antenatal admission for pre-eclampsia.  She was seen in the office today for a routine OB visit with 1+ protein in her urine. She reported mild swelling of lower extremities but denied symptoms of PIH. Her BP was normal. She reports good fetal activity and denies contractions, LOF or bleeding.  PIH labs were called in tonight with a PCR=0.45. Normal CMP. Normal platelets at 293. Hgb at 9.2  Pregnancy followed at [facility] since 12  weeks and remarkable for:  1. Depressive disorder followed at the [facility] 2. Anemia  OB History    Gravida Para Term Preterm AB Living   1             SAB TAB Ectopic Multiple Live Births                 Past Medical History:  Diagnosis Date  . Asthma    Past Surgical History:  Procedure Laterality Date  . NO PAST SURGERIES      Family History:   Mother: HTN MGF: Diabetes  Social History:    reports that she has never smoked. She has never used smokeless tobacco. She reports that she does not drink alcohol or use drugs.   Prenatal labs: ABO, Rh: --/--/O POS (01/12 0003) Antibody:  negative Rubella: immune RPR: Non Reactive (01/12 0003)  HBsAg:   negative HIV: Non Reactive (01/12 0003)  GBS:   not collected Glucola: normal   Prenatal Transfer Tool  Maternal Diabetes: No Genetic Screening: Normal Maternal Ultrasounds/Referrals: Normal Fetal Ultrasounds or other Referrals:  29 weeks for growth with AGA and normal AFI Maternal Substance Abuse:  No Significant Maternal Medications:  None Significant Maternal Lab Results: Lab values include: Other: PCR 0.47 03/04/16 in office     Blood pressure 133/97, pulse 89, temperature 98.3 F (36.8 C), temperature source Oral, resp. rate 20, last menstrual period 07/01/2015.  General Appearance: Alert, appropriate appearance for age. No acute distress HEENT Exam: Grossly normal Chest/Respiratory Exam: Normal chest wall and  respirations. Clear to auscultation  Cardiovascular Exam: Regular rate and rhythm. S1, S2, no murmur Gastrointestinal Exam: soft, non-tender, Uterus gravid with size compatible with GA, Vertex presentation by Leopold's maneuvers Psychiatric Exam: Alert and oriented, appropriate affect DTR: 2+ no clonus, 2+ edema  Fetal tracings: Category 1 Contractions every 2 minutes not perceived by patient  ++++++++++++++++++++++++++++++++++++++++++++++++++++++++++++++++   Assessment/Plan:  SIUP at 35+2 weeks with pre-eclampsia without severe features Admit to evaluate further Will plan to deliver at 37 weeks unless associated with severe features  Plan reviewed with patient and FOB  Silverio LaySandra Kacia Halley MD 03/04/2016, 11:49 PM

## 2016-03-05 ENCOUNTER — Observation Stay (HOSPITAL_COMMUNITY): Payer: Medicaid Other

## 2016-03-05 ENCOUNTER — Encounter (HOSPITAL_COMMUNITY): Payer: Self-pay | Admitting: *Deleted

## 2016-03-05 LAB — COMPREHENSIVE METABOLIC PANEL
ALK PHOS: 210 U/L — AB (ref 38–126)
ALT: 20 U/L (ref 14–54)
AST: 24 U/L (ref 15–41)
Albumin: 2.6 g/dL — ABNORMAL LOW (ref 3.5–5.0)
Anion gap: 7 (ref 5–15)
BUN: 11 mg/dL (ref 6–20)
CALCIUM: 8.4 mg/dL — AB (ref 8.9–10.3)
CO2: 21 mmol/L — ABNORMAL LOW (ref 22–32)
CREATININE: 0.63 mg/dL (ref 0.44–1.00)
Chloride: 108 mmol/L (ref 101–111)
Glucose, Bld: 65 mg/dL (ref 65–99)
Potassium: 4 mmol/L (ref 3.5–5.1)
Sodium: 136 mmol/L (ref 135–145)
Total Bilirubin: 0.8 mg/dL (ref 0.3–1.2)
Total Protein: 6.2 g/dL — ABNORMAL LOW (ref 6.5–8.1)

## 2016-03-05 LAB — URINE MICROSCOPIC-ADD ON: RBC / HPF: NONE SEEN RBC/hpf (ref 0–5)

## 2016-03-05 LAB — URINALYSIS, ROUTINE W REFLEX MICROSCOPIC
Bilirubin Urine: NEGATIVE
Glucose, UA: NEGATIVE mg/dL
Hgb urine dipstick: NEGATIVE
Ketones, ur: NEGATIVE mg/dL
Nitrite: NEGATIVE
Protein, ur: NEGATIVE mg/dL
Specific Gravity, Urine: 1.015 (ref 1.005–1.030)
pH: 7 (ref 5.0–8.0)

## 2016-03-05 LAB — TYPE AND SCREEN
ABO/RH(D): O POS
Antibody Screen: NEGATIVE

## 2016-03-05 LAB — CBC
HCT: 28.1 % — ABNORMAL LOW (ref 36.0–46.0)
Hemoglobin: 8.8 g/dL — ABNORMAL LOW (ref 12.0–15.0)
MCH: 25.3 pg — ABNORMAL LOW (ref 26.0–34.0)
MCHC: 31.3 g/dL (ref 30.0–36.0)
MCV: 80.7 fL (ref 78.0–100.0)
PLATELETS: 293 10*3/uL (ref 150–400)
RBC: 3.48 MIL/uL — ABNORMAL LOW (ref 3.87–5.11)
RDW: 14.7 % (ref 11.5–15.5)
WBC: 7.9 10*3/uL (ref 4.0–10.5)

## 2016-03-05 LAB — PROTEIN / CREATININE RATIO, URINE
Creatinine, Urine: 145 mg/dL
Protein Creatinine Ratio: 0.29 mg/mg{creat} — ABNORMAL HIGH (ref 0.00–0.15)
Total Protein, Urine: 42 mg/dL

## 2016-03-05 LAB — URIC ACID: URIC ACID, SERUM: 5.1 mg/dL (ref 2.3–6.6)

## 2016-03-05 MED ORDER — PRENATAL MULTIVITAMIN CH
1.0000 | ORAL_TABLET | Freq: Every day | ORAL | Status: DC
  Administered 2016-03-05: 1 via ORAL
  Filled 2016-03-05 (×2): qty 1

## 2016-03-05 MED ORDER — DOCUSATE SODIUM 100 MG PO CAPS
100.0000 mg | ORAL_CAPSULE | Freq: Every day | ORAL | Status: DC
  Filled 2016-03-05 (×2): qty 1

## 2016-03-05 MED ORDER — ACETAMINOPHEN 325 MG PO TABS: 650.0000 mg | ORAL_TABLET | ORAL | Status: DC | PRN

## 2016-03-05 MED ORDER — ZOLPIDEM TARTRATE 5 MG PO TABS
5.0000 mg | ORAL_TABLET | Freq: Every evening | ORAL | Status: DC | PRN
  Administered 2016-03-05: 5 mg via ORAL
  Filled 2016-03-05: qty 1

## 2016-03-05 MED ORDER — BETAMETHASONE SOD PHOS & ACET 6 (3-3) MG/ML IJ SUSP
12.0000 mg | INTRAMUSCULAR | Status: AC
  Administered 2016-03-05 (×2): 12 mg via INTRAMUSCULAR
  Filled 2016-03-05 (×2): qty 2

## 2016-03-05 MED ORDER — CALCIUM CARBONATE ANTACID 500 MG PO CHEW: 2.0000 | CHEWABLE_TABLET | ORAL | Status: DC | PRN

## 2016-03-05 NOTE — Progress Notes (Signed)
Patient ID: Kristin Bruce, female   DOB: February 08, 1996, 20 y.o.   MRN: [id number] Pt without complaints.  No leakage of fluid or VB.  Good FM. Denies HA, blurred vision or RUQ pain  BP 133/83   Pulse 80   Temp 98.4 F (36.9 C) (Oral)   Resp 16   Ht '5\' 1"'$  (1.549 m)   Wt 137 lb (62.1 kg)   LMP 07/01/2015   BMI 25.89 kg/m   FHTS cat 1  Toco irregular  Pt in NAD CV RRR Lungs CTAB abd  Gravid soft and NT GU no vb EXt no calf tenderness Results for orders placed or performed during the hospital encounter of 03/04/16 (from the past 72 hour(s))  Urinalysis, Routine w reflex microscopic (not at [facility])     Status: Abnormal   Collection Time: 03/04/16 11:45 PM  Result Value Ref Range   Color, Urine YELLOW YELLOW   APPearance HAZY (A) CLEAR   Specific Gravity, Urine 1.015 1.005 - 1.030   pH 7.0 5.0 - 8.0   Glucose, UA NEGATIVE NEGATIVE mg/dL   Hgb urine dipstick NEGATIVE NEGATIVE   Bilirubin Urine NEGATIVE NEGATIVE   Ketones, ur NEGATIVE NEGATIVE mg/dL   Protein, ur NEGATIVE NEGATIVE mg/dL   Nitrite NEGATIVE NEGATIVE   Leukocytes, UA MODERATE (A) NEGATIVE  Protein / creatinine ratio, urine     Status: Abnormal   Collection Time: 03/04/16 11:45 PM  Result Value Ref Range   Creatinine, Urine 145.00 mg/dL   Total Protein, Urine 42 mg/dL    Comment: NO NORMAL RANGE ESTABLISHED FOR THIS TEST   Protein Creatinine Ratio 0.29 (H) 0.00 - 0.15 mg/mg[Cre]  Urine microscopic-add on     Status: Abnormal   Collection Time: 03/04/16 11:45 PM  Result Value Ref Range   Squamous Epithelial / LPF 6-30 (A) NONE SEEN   WBC, UA 6-30 0 - 5 WBC/hpf   RBC / HPF NONE SEEN 0 - 5 RBC/hpf   Bacteria, UA FEW (A) NONE SEEN   Urine-Other MUCOUS PRESENT   Type and screen [facility]     Status: None   Collection Time: 03/05/16 12:48 AM  Result Value Ref Range   ABO/RH(D) O POS    Antibody Screen NEG    Sample Expiration 03/08/2016   Comprehensive metabolic panel     Status: Abnormal   Collection Time: 03/05/16 12:49 AM  Result Value Ref Range   Sodium 136 135 - 145 mmol/L   Potassium 4.0 3.5 - 5.1 mmol/L   Chloride 108 101 - 111 mmol/L   CO2 21 (L) 22 - 32 mmol/L   Glucose, Bld 65 65 - 99 mg/dL   BUN 11 6 - 20 mg/dL   Creatinine, Ser 0.63 0.44 - 1.00 mg/dL   Calcium 8.4 (L) 8.9 - 10.3 mg/dL   Total Protein 6.2 (L) 6.5 - 8.1 g/dL   Albumin 2.6 (L) 3.5 - 5.0 g/dL   AST 24 15 - 41 U/L   ALT 20 14 - 54 U/L   Alkaline Phosphatase 210 (H) 38 - 126 U/L   Total Bilirubin 0.8 0.3 - 1.2 mg/dL   GFR calc non Af Amer >60 >60 mL/min   GFR calc Af Amer >60 >60 mL/min    Comment: (NOTE) The eGFR has been calculated using the CKD EPI equation. This calculation has not been validated in all clinical situations. eGFR's persistently <60 mL/min signify possible Chronic Kidney Disease.    Anion gap 7 5 - 15  Uric acid     Status: None   Collection Time: 03/05/16 12:49 AM  Result Value Ref Range   Uric Acid, Serum 5.1 2.3 - 6.6 mg/dL  CBC on admission     Status: Abnormal   Collection Time: 03/05/16 12:49 AM  Result Value Ref Range   WBC 7.9 4.0 - 10.5 K/uL   RBC 3.48 (L) 3.87 - 5.11 MIL/uL   Hemoglobin 8.8 (L) 12.0 - 15.0 g/dL   HCT 28.1 (L) 36.0 - 46.0 %   MCV 80.7 78.0 - 100.0 fL   MCH 25.3 (L) 26.0 - 34.0 pg   MCHC 31.3 30.0 - 36.0 g/dL   RDW 14.7 11.5 - 15.5 %   Platelets 293 150 - 400 K/uL    Assessment and Plan [redacted]w[redacted]d Preeclampsia withut severe features Will do growth [place] MFM consult for [place] discuss if the pt is a candidate for out patient treatment

## 2016-03-05 NOTE — Discharge Instructions (Signed)
Hypertension During Pregnancy °Hypertension is also called high blood pressure. Blood pressure moves blood in your body. Sometimes, the force that moves the blood becomes too strong. When you are pregnant, this condition should be watched carefully. It can cause problems for you and your baby. °HOME CARE  °· Make and keep all of your doctor visits. °· Take medicine as told by your doctor. Tell your doctor about all medicines you take. °· Eat very little salt. °· Exercise regularly. °· Do not drink alcohol. °· Do not smoke. °· Do not have drinks with caffeine. °· Lie on your left side when resting. °· Your health care provider may ask you to take one low-dose aspirin (81mg) each day. °GET HELP RIGHT AWAY IF: °· You have bad belly (abdominal) pain. °· You have sudden puffiness (swelling) in the hands, ankles, or face. °· You gain 4 pounds (1.8 kilograms) or more in 1 week. °· You throw up (vomit) repeatedly. °· You have bleeding from the vagina. °· You do not feel the baby moving as much. °· You have a headache. °· You have blurred or double vision. °· You have muscle twitching or spasms. °· You have shortness of breath. °· You have blue fingernails and lips. °· You have blood in your pee (urine). °MAKE SURE YOU: °· Understand these instructions. °· Will watch your condition. °· Will get help right away if you are not doing well or get worse. °  °This information is not intended to replace advice given to you by your health care provider. Make sure you discuss any questions you have with your health care provider. °  °Document Released: 08/07/2010 Document Revised: 07/26/2014 Document Reviewed: 02/01/2013 °Elsevier Interactive Patient Education ©2016 [employer] ° °

## 2016-03-05 NOTE — Progress Notes (Signed)
MFM consult, staff note:  Impressions: 1. SIUP at 559w3d 2. >2 episodes of mild HTN > 4 hours apart onset >20 weeks--> confers diagnosis of gestational HTN vs preeclampsia 3. P:C ratio 0.4 is indeterminate (0.15-0.7 is the range for "indeterminate" as a spot P:C does not have direct correlation and warrants 24 hour urine collection for total protein assessment) 4. No severe features  Discussion: I reviewed hypertension and gestational hypertension as a cause of uteroplacental insufficiency, with increased risk of IUGR, oligohydramnios, and stillbirth. I told her that her GHTN also places her at increased risk for severe preeclampsia, describing the triad of increased blood pressure, proteinuria, and abnormal edema. Lastly, hypertension (severe range) increases the risk of placental abruption, especially in the setting of superimposed preeclampsia.  I reviewed the essential tenets in the most recent guidelines for management of hypertension in pregnancy in accordance with the American College of Obstetrics and Gynecology expert opinion. We talked about the medical treatment of hypertension in pregnancy. Currently, she does not meet criteria (<160/110) and needs no treatment. If she does (>160/110), she will have severe GHTN and need admission, surveillance like preeclampsia with severe features and delivery given she is > 34 weeks.  I recommend continued weekly preeclampsia panels regardless of 24 hour urine collection as current ACOG guidelines recommend same management of GHTN and preeclampsia (no difference in management based on presence or absence of protein only difference in official diagnosis).  Summary of Recommendations: 1. Outpatient surveillance is appropriate.  I would get a 24 hour urine collection to verify >300mg  protein or not as outpatient, noting the categorization ultimately does not affect management. 2. Treat only severe range blood pressures with antihypertensive as needed; ie,  use of antihypertensives should be reserved until persistent severe range BP are documented, noting that this would then require admission until delivery as severe feature. 3. Weekly labs for preeclampsia along with weekly physical examination in your office during prenatal visits 4. Strict precautions for symptoms/signs of severe preeclampsia (patient should present for evaluation and be admitted for delivery) 5. Would assess this patient clinically weekly to detect evidence of either maternal or fetal deterioration, noting that expectant management would not be recommended if deterioration of either mom or fetus became apparent. Likewise, admission should occur for onset of severe features. 6. would initiate twice weekly NST with weekly AFI in GHTN/preeclampsia  7. fetal kick counts  8. Lastly, regardless of whether this mom and baby continue to remain stable, I would recommend delivery by 37 weeks, noting this remains contingent upon a reassuring fetal testing and stable maternal condition without onset of severe features in the interim.  I spent in excess of 30 minutes reviewing and evaluating this patient's case with greater than 50% of this time in face to face discussion.  Page with questions.  Merideth AbbeyJ. M. Horice Carrero, MD, MS, Evern CoreFACOG

## 2016-03-05 NOTE — Progress Notes (Signed)
Discharge instructions reviewed with patient and mother. Verbalized an understanding of discharge instructions. Discharged ambulatory with mother.

## 2016-03-06 LAB — URINE CULTURE

## 2016-03-07 ENCOUNTER — Encounter (HOSPITAL_COMMUNITY): Payer: Self-pay

## 2016-03-07 ENCOUNTER — Inpatient Hospital Stay (HOSPITAL_COMMUNITY)
Admission: AD | Admit: 2016-03-07 | Discharge: 2016-03-07 | Disposition: A | Discharge status: Home / Self Care | Payer: Medicaid Other | Source: Ambulatory Visit | Attending: Obstetrics & Gynecology | Admitting: Obstetrics & Gynecology

## 2016-03-07 DIAGNOSIS — J45909 Unspecified asthma, uncomplicated: Secondary | ICD-10-CM | POA: Insufficient documentation

## 2016-03-07 DIAGNOSIS — O1493 Unspecified pre-eclampsia, third trimester: Secondary | ICD-10-CM | POA: Diagnosis not present

## 2016-03-07 DIAGNOSIS — O1203 Gestational edema, third trimester: Secondary | ICD-10-CM

## 2016-03-07 DIAGNOSIS — O99513 Diseases of the respiratory system complicating pregnancy, third trimester: Secondary | ICD-10-CM | POA: Insufficient documentation

## 2016-03-07 LAB — CBC
HCT: 24.8 % — ABNORMAL LOW (ref 36.0–46.0)
Hemoglobin: 7.8 g/dL — ABNORMAL LOW (ref 12.0–15.0)
MCH: 25.3 pg — ABNORMAL LOW (ref 26.0–34.0)
MCHC: 31.5 g/dL (ref 30.0–36.0)
MCV: 80.5 fL (ref 78.0–100.0)
PLATELETS: 260 10*3/uL (ref 150–400)
RBC: 3.08 MIL/uL — AB (ref 3.87–5.11)
RDW: 14.9 % (ref 11.5–15.5)
WBC: 8.5 10*3/uL (ref 4.0–10.5)

## 2016-03-07 LAB — URINALYSIS, ROUTINE W REFLEX MICROSCOPIC
BILIRUBIN URINE: NEGATIVE
GLUCOSE, UA: NEGATIVE mg/dL
HGB URINE DIPSTICK: NEGATIVE
Ketones, ur: NEGATIVE mg/dL
NITRITE: NEGATIVE
PROTEIN: NEGATIVE mg/dL
pH: 5.5 (ref 5.0–8.0)

## 2016-03-07 LAB — LACTATE DEHYDROGENASE: LDH: 171 U/L (ref 98–192)

## 2016-03-07 LAB — COMPREHENSIVE METABOLIC PANEL
ALBUMIN: 2.4 g/dL — AB (ref 3.5–5.0)
ALT: 21 U/L (ref 14–54)
AST: 24 U/L (ref 15–41)
Alkaline Phosphatase: 178 U/L — ABNORMAL HIGH (ref 38–126)
Anion gap: 4 — ABNORMAL LOW (ref 5–15)
BUN: 14 mg/dL (ref 6–20)
CHLORIDE: 108 mmol/L (ref 101–111)
CO2: 23 mmol/L (ref 22–32)
CREATININE: 0.76 mg/dL (ref 0.44–1.00)
Calcium: 8.3 mg/dL — ABNORMAL LOW (ref 8.9–10.3)
GFR calc Af Amer: >60 mL/min (ref >60)
GLUCOSE: 87 mg/dL (ref 65–99)
Potassium: 4.3 mmol/L (ref 3.5–5.1)
SODIUM: 135 mmol/L (ref 135–145)
Total Bilirubin: 0.6 mg/dL (ref 0.3–1.2)
Total Protein: 5.8 g/dL — ABNORMAL LOW (ref 6.5–8.1)

## 2016-03-07 LAB — PROTEIN / CREATININE RATIO, URINE
Creatinine, Urine: 41 mg/dL
PROTEIN CREATININE RATIO: 0.34 mg/mg{creat} — AB (ref 0.00–0.15)
Total Protein, Urine: 14 mg/dL

## 2016-03-07 LAB — URINE MICROSCOPIC-ADD ON

## 2016-03-07 LAB — URIC ACID: Uric Acid, Serum: 4.9 mg/dL (ref 2.3–6.6)

## 2016-03-07 NOTE — MAU Note (Signed)
Pt c/o lower extremity swelling. States that she is being followed for preeclampsia. Denies HA, vision changes, SOB, RUQ pain. Denies contractions, LOF, vag bleeding. +FM

## 2016-03-07 NOTE — MAU Provider Note (Signed)
Chief Complaint:  Leg Swelling   First Provider Initiated Contact with Patient 03/07/16 0255      HPI: Kristin Bruce is a 20 y.o. G1P0 at 10w5dwho presents to maternity admissions reporting increased swelling of both legs in last 24 hours, also reporting 4 lb weight gain in 2 days.  She has not tried any treatments and nothing makes the swelling better or worse.  She reports some elevated BP recently and is collecting a 24 hour urine currently to take to the office on Monday.  She denies h/a, epigastric pain, or visual disturbances.   She reports good fetal movement, denies regular contractions, LOF, vaginal bleeding, vaginal itching/burning, urinary symptoms, dizziness, n/v, or fever/chills.    HPI  Past Medical History: Past Medical History:  Diagnosis Date  . Asthma     Past obstetric history: OB History  Gravida Para Term Preterm AB Living  1         0  SAB TAB Ectopic Multiple Live Births               # Outcome Date GA Lbr Len/2nd Weight Sex Delivery Anes PTL Lv  1 Current               Past Surgical History: Past Surgical History:  Procedure Laterality Date  . NO PAST SURGERIES      Family History: Family History  Problem Relation Age of Onset  . Diabetes Mother   . Diabetes Maternal Grandfather     Social History: Social History  Substance Use Topics  . Smoking status: Never Smoker  . Smokeless tobacco: Never Used  . Alcohol use No    Allergies: No Known Allergies  Meds:  Prescriptions Prior to Admission  Medication Sig Dispense Refill Last Dose  . albuterol (PROVENTIL HFA;VENTOLIN HFA) 108 (90 Base) MCG/ACT inhaler Inhale 1-2 puffs into the lungs every 6 (six) hours as needed for wheezing or shortness of breath.   rescue  . ferrous sulfate (FERROUSUL) 325 (65 FE) MG tablet Take 1 tablet (325 mg total) by mouth 2 (two) times daily with a meal. (Patient not taking: Reported on 03/05/2016) 60 tablet 3 Not Taking at Unknown time    ROS:  Review of  Systems  Constitutional: Negative for chills, fatigue and fever.  Eyes: Negative for visual disturbance.  Respiratory: Negative for shortness of breath.   Cardiovascular: Negative for chest pain.  Gastrointestinal: Negative for abdominal pain, nausea and vomiting.  Genitourinary: Negative for difficulty urinating, dysuria, flank pain, pelvic pain, vaginal bleeding, vaginal discharge and vaginal pain.  Neurological: Negative for dizziness and headaches.  Psychiatric/Behavioral: Negative.      I have reviewed patient's Past Medical Hx, Surgical Hx, Family Hx, Social Hx, medications and allergies.   Physical Exam   Patient Vitals for the past 24 hrs:  BP Temp Temp src Pulse Resp SpO2 Height Weight  03/07/16 0331 105/68 - - 73 - - - -  03/07/16 0316 107/70 - - 69 - - - -  03/07/16 0306 128/59 - - 72 - - - -  03/07/16 0216 122/81 - - 72 - - - -  03/07/16 0201 130/90 - - 79 - - - -  03/07/16 0146 124/80 - - 68 - - - -  03/07/16 0131 132/92 - - 72 - - - -  03/07/16 0125 131/92 - - 84 - - - -  03/07/16 0118 130/87 99 F (37.2 C) Oral 95 16 99 % - -  03/07/16 0109 - - - - - -  5\' 1"  (1.549 m) 141 lb (64 kg)   Constitutional: Well-developed, well-nourished female in no acute distress.  Cardiovascular: normal rate Respiratory: normal effort GI: Abd soft, non-tender, gravid appropriate for gestational age.  MS: Extremities nontender, +2 nonpitting BLE edema, normal ROM Neurologic: Alert and oriented x 4.  GU: Neg CVAT.   FHT:  Baseline 135, moderate variability, accelerations present, no decelerations Contractions: occasional, mild to palpation   Labs: Results for orders placed or performed during the hospital encounter of 03/07/16 (from the past 24 hour(s))  Urinalysis, Routine w reflex microscopic (not [facility])     Status: Abnormal   Collection Time: 03/07/16  1:05 AM  Result Value Ref Range   Color, Urine YELLOW YELLOW   APPearance CLEAR CLEAR   Specific Gravity, Urine <1.005  (L) 1.005 - 1.030   pH 5.5 5.0 - 8.0   Glucose, UA NEGATIVE NEGATIVE mg/dL   Hgb urine dipstick NEGATIVE NEGATIVE   Bilirubin Urine NEGATIVE NEGATIVE   Ketones, ur NEGATIVE NEGATIVE mg/dL   Protein, ur NEGATIVE NEGATIVE mg/dL   Nitrite NEGATIVE NEGATIVE   Leukocytes, UA LARGE (A) NEGATIVE  Protein / creatinine ratio, urine     Status: Abnormal   Collection Time: 03/07/16  1:05 AM  Result Value Ref Range   Creatinine, Urine 41.00 mg/dL   Total Protein, Urine 14 mg/dL   Protein Creatinine Ratio 0.34 (H) 0.00 - 0.15 mg/mg[Cre]  Urine microscopic-add on     Status: Abnormal   Collection Time: 03/07/16  1:05 AM  Result Value Ref Range   Squamous Epithelial / LPF 6-30 (A) NONE SEEN   WBC, UA 6-30 0 - 5 WBC/hpf   RBC / HPF 0-5 0 - 5 RBC/hpf   Bacteria, UA RARE (A) NONE SEEN  CBC     Status: Abnormal   Collection Time: 03/07/16  1:53 AM  Result Value Ref Range   WBC 8.5 4.0 - 10.5 K/uL   RBC 3.08 (L) 3.87 - 5.11 MIL/uL   Hemoglobin 7.8 (L) 12.0 - 15.0 g/dL   HCT 78.224.8 (L) 95.636.0 - 21.346.0 %   MCV 80.5 78.0 - 100.0 fL   MCH 25.3 (L) 26.0 - 34.0 pg   MCHC 31.5 30.0 - 36.0 g/dL   RDW 08.614.9 57.811.5 - 46.915.5 %   Platelets 260 150 - 400 K/uL  Comprehensive metabolic panel     Status: Abnormal   Collection Time: 03/07/16  1:53 AM  Result Value Ref Range   Sodium 135 135 - 145 mmol/L   Potassium 4.3 3.5 - 5.1 mmol/L   Chloride 108 101 - 111 mmol/L   CO2 23 22 - 32 mmol/L   Glucose, Bld 87 65 - 99 mg/dL   BUN 14 6 - 20 mg/dL   Creatinine, Ser 6.290.76 0.44 - 1.00 mg/dL   Calcium 8.3 (L) 8.9 - 10.3 mg/dL   Total Protein 5.8 (L) 6.5 - 8.1 g/dL   Albumin 2.4 (L) 3.5 - 5.0 g/dL   AST 24 15 - 41 U/L   ALT 21 14 - 54 U/L   Alkaline Phosphatase 178 (H) 38 - 126 U/L   Total Bilirubin 0.6 0.3 - 1.2 mg/dL   GFR calc non Af Amer >60 >60 mL/min   GFR calc Af Amer >60 >60 mL/min   Anion gap 4 (L) 5 - 15  Uric acid     Status: None   Collection Time: 03/07/16  1:53 AM  Result Value Ref Range   Uric Acid,  Serum  4.9 2.3 - 6.6 mg/dL  Lactate dehydrogenase     Status: None   Collection Time: 03/07/16  1:53 AM  Result Value Ref Range   LDH 171 98 - 192 U/L   --/--/O POS (08/18 0048)  Imaging:  [place] Mfm Ob Comp + 14 Wk  Result Date: 03/05/2016 OBSTETRICAL ULTRASOUND: This exam was performed within a [place] [facility]. The OB US report was generated in the AS system, and faxed to the ordering physician.  This report is available in the [employer] PACS. See the AS Obstetric US report via the Image Link.   MAU Course/MDM: I have ordered labs and reviewed results.  Results c/w previous labs 2 days ago.  Consult Richardson Doppole.  Since pt s/p BMZ injections x 2 last week and no evidence of severe features today, pt to be followed closely outpatient.  Pt has appt at [facility] on 8/21 in the am.  Recommend elevation, walking, decreased sodium in diet, increased PO fluids for swelling.  Warning signs of preeclampsia reviewed.  Pt stable at time of discharge.  Assessment: 1. Preeclampsia, third trimester   2. Edema during pregnancy, antepartum, third trimester     Plan: Discharge home with preeclampsia precautions Labor precautions and fetal kick counts  Follow-up Information    Konrad FelixKULWA,EMA WAKURU, MD .   Specialty:  Obstetrics and Gynecology Why:  As scheduled on Monday 8/21.  Return to MAU as needed for emergencies. Contact information: [address] [place] [address] [phone]            Medication List    TAKE these medications   albuterol 108 (90 Base) MCG/ACT inhaler Commonly known as:  PROVENTIL HFA;VENTOLIN HFA Inhale 1-2 puffs into the lungs every 6 (six) hours as needed for wheezing or shortness of breath.   ferrous sulfate 325 (65 FE) MG tablet Commonly known as:  FERROUSUL Take 1 tablet (325 mg total) by mouth 2 (two) times daily with a meal.       Sharen CounterLisa Leftwich-Kirby Certified Nurse-Midwife 03/07/2016 4:14 AM

## 2016-03-07 NOTE — Discharge Instructions (Signed)

## 2016-03-08 ENCOUNTER — Telehealth (HOSPITAL_COMMUNITY): Payer: Self-pay | Admitting: *Deleted

## 2016-03-08 NOTE — Telephone Encounter (Signed)
Preadmission screen  

## 2016-03-09 ENCOUNTER — Encounter (HOSPITAL_COMMUNITY): Payer: Self-pay

## 2016-03-09 ENCOUNTER — Inpatient Hospital Stay (HOSPITAL_COMMUNITY)
Admission: AD | Admit: 2016-03-09 | Discharge: 2016-03-09 | Disposition: A | Discharge status: Home / Self Care | Payer: Medicaid Other | Source: Ambulatory Visit | Attending: Obstetrics and Gynecology | Admitting: Obstetrics and Gynecology

## 2016-03-09 DIAGNOSIS — Z3A36 36 weeks gestation of pregnancy: Secondary | ICD-10-CM | POA: Insufficient documentation

## 2016-03-09 DIAGNOSIS — O133 Gestational [pregnancy-induced] hypertension without significant proteinuria, third trimester: Secondary | ICD-10-CM | POA: Diagnosis not present

## 2016-03-09 DIAGNOSIS — O99513 Diseases of the respiratory system complicating pregnancy, third trimester: Secondary | ICD-10-CM | POA: Insufficient documentation

## 2016-03-09 DIAGNOSIS — Z833 Family history of diabetes mellitus: Secondary | ICD-10-CM | POA: Insufficient documentation

## 2016-03-09 DIAGNOSIS — J45909 Unspecified asthma, uncomplicated: Secondary | ICD-10-CM | POA: Insufficient documentation

## 2016-03-09 DIAGNOSIS — I10 Essential (primary) hypertension: Secondary | ICD-10-CM | POA: Diagnosis present

## 2016-03-09 LAB — URINALYSIS, ROUTINE W REFLEX MICROSCOPIC
Bilirubin Urine: NEGATIVE
GLUCOSE, UA: NEGATIVE mg/dL
HGB URINE DIPSTICK: NEGATIVE
KETONES UR: NEGATIVE mg/dL
Nitrite: NEGATIVE
PH: 7 (ref 5.0–8.0)
PROTEIN: NEGATIVE mg/dL
Specific Gravity, Urine: 1.01 (ref 1.005–1.030)

## 2016-03-09 LAB — URINE MICROSCOPIC-ADD ON: RBC / HPF: NONE SEEN RBC/hpf (ref 0–5)

## 2016-03-09 NOTE — MAU Note (Signed)
Pt sent from office for BP workup.

## 2016-03-09 NOTE — MAU Provider Note (Signed)
Chief Complaint:  Hypertension   First Provider Initiated Contact with Patient 03/09/16 1127     HPI: Kristin Bruce is a 20 y.o. G1P0 at 36w0 who presents to maternity admissions reporting elevated blood pressure.  States legs have been swelling.  Was seen  Here 2 days ago for similar evaluation.  Had betamethasone series last week.  Workup was negative She reports good fetal movement, denies LOF, vaginal bleeding, vaginal itching/burning, urinary symptoms, h/a, dizziness, n/v, diarrhea, constipation or fever/chills.  She denies headache, visual changes or RUQ abdominal pain.  Hypertension  This is a recurrent problem. The current episode started in the past 7 days. The problem is unchanged. Associated symptoms include peripheral edema. Pertinent negatives include no blurred vision, chest pain, headaches or shortness of breath. There are no associated agents to hypertension. Past treatments include nothing.    RN Note: Pt sent from office for BP workup  Past Medical History: Past Medical History:  Diagnosis Date  . Asthma     Past obstetric history: OB History  Gravida Para Term Preterm AB Living  1         0  SAB TAB Ectopic Multiple Live Births               # Outcome Date GA Lbr Len/2nd Weight Sex Delivery Anes PTL Lv  1 Current               Past Surgical History: Past Surgical History:  Procedure Laterality Date  . NO PAST SURGERIES      Family History: Family History  Problem Relation Age of Onset  . Diabetes Mother   . Diabetes Maternal Grandfather     Social History: Social History  Substance Use Topics  . Smoking status: Never Smoker  . Smokeless tobacco: Never Used  . Alcohol use No    Allergies: No Known Allergies  Meds:  Prescriptions Prior to Admission  Medication Sig Dispense Refill Last Dose  . Prenatal Vit-Fe Fumarate-FA (PRENATAL MULTIVITAMIN) TABS tablet Take 1 tablet by mouth daily at 12 noon.   Past Week at Unknown time  . albuterol  (PROVENTIL HFA;VENTOLIN HFA) 108 (90 Base) MCG/ACT inhaler Inhale 1-2 puffs into the lungs every 6 (six) hours as needed for wheezing or shortness of breath.   emergency  . ferrous sulfate (FERROUSUL) 325 (65 FE) MG tablet Take 1 tablet (325 mg total) by mouth 2 (two) times daily with a meal. (Patient not taking: Reported on 03/05/2016) 60 tablet 3 Not Taking at Unknown time    I have reviewed patient's Past Medical Hx, Surgical Hx, Family Hx, Social Hx, medications and allergies.   ROS:  Review of Systems  Eyes: Negative for blurred vision.  Respiratory: Negative for shortness of breath.   Cardiovascular: Negative for chest pain.  Neurological: Negative for headaches.   Other systems negative  Physical Exam  Patient Vitals for the past 24 hrs:  BP Temp Temp src Pulse Resp Height Weight  03/09/16 1129 130/85 - - 79 - - -  03/09/16 1121 131/93 - - 74 - - -  03/09/16 1114 140/90 98.8 F (37.1 C) Oral 81 16 5\' 1"  (1.549 m) 64 kg (141 lb)   Vitals:   03/09/16 1129 03/09/16 1144 03/09/16 1159 03/09/16 1210  BP: 130/85 119/72 112/61 112/61  Pulse: 79 80 88 88  Resp:    16  Temp:      TempSrc:      Weight:      Height:  Constitutional: Well-developed, well-nourished female in no acute distress.  Cardiovascular: normal rate and rhythm Respiratory: normal effort, clear to auscultation bilaterally GI: Abd soft, non-tender, gravid appropriate for gestational age.   No rebound or guarding. MS: Extremities nontender, no edema, normal ROM Neurologic: Alert and oriented x 4.  GU: Neg CVAT.   FHT:  Baseline 140 , moderate variability, accelerations present, no decelerations Contractions: Irregular    Labs: Results for orders placed or performed during the hospital encounter of 03/09/16 (from the past 72 hour(s))  Urinalysis, Routine w reflex microscopic (not at [facility])     Status: Abnormal   Collection Time: 03/09/16 11:05 AM  Result Value Ref Range   Color, Urine YELLOW YELLOW    APPearance HAZY (A) CLEAR   Specific Gravity, Urine 1.010 1.005 - 1.030   pH 7.0 5.0 - 8.0   Glucose, UA NEGATIVE NEGATIVE mg/dL   Hgb urine dipstick NEGATIVE NEGATIVE   Bilirubin Urine NEGATIVE NEGATIVE   Ketones, ur NEGATIVE NEGATIVE mg/dL   Protein, ur NEGATIVE NEGATIVE mg/dL   Nitrite NEGATIVE NEGATIVE   Leukocytes, UA LARGE (A) NEGATIVE  Urine microscopic-add on     Status: Abnormal   Collection Time: 03/09/16 11:05 AM  Result Value Ref Range   Squamous Epithelial / LPF 6-30 (A) NONE SEEN   WBC, UA 6-30 0 - 5 WBC/hpf   RBC / HPF NONE SEEN 0 - 5 RBC/hpf   Bacteria, UA RARE (A) NONE SEEN    Imaging:  [place] Mfm Ob Comp + 14 Wk  Result Date: 03/05/2016 OBSTETRICAL ULTRASOUND: This exam was performed within a [place] [facility] Department. The OB US report was generated in the AS system, and faxed to the ordering physician.  This report is available in the [employer] PACS. See the AS Obstetric US report via the Image Link.   MAU Course/MDM: I have ordered labs and reviewed results.  NST reviewed Consult Dr Estanislado Pandyivard with presentation, exam findings and test results.      Assessment: SIUP at 6142w0d Gestational Hypertension without severe features Reassuring nonstress test, Category I  Plan: Discharge home Preeclampsia precautions Labor precautions and fetal kick counts Follow up in Office for prenatal visits and recheck of BP   Pt stable at time of discharge.  Encouraged to return here or to other Urgent Care/ED if she develops worsening of symptoms, increase in pain, fever, or other concerning symptoms.      Kristin Bruce CNM, MSN Certified Nurse-Midwife 03/09/2016 12:01 PM

## 2016-03-09 NOTE — Discharge Instructions (Signed)
Hypertension During Pregnancy °Hypertension is also called high blood pressure. Blood pressure moves blood in your body. Sometimes, the force that moves the blood becomes too strong. When you are pregnant, this condition should be watched carefully. It can cause problems for you and your baby. °HOME CARE  °· Make and keep all of your doctor visits. °· Take medicine as told by your doctor. Tell your doctor about all medicines you take. °· Eat very little salt. °· Exercise regularly. °· Do not drink alcohol. °· Do not smoke. °· Do not have drinks with caffeine. °· Lie on your left side when resting. °· Your health care provider may ask you to take one low-dose aspirin (81mg) each day. °GET HELP RIGHT AWAY IF: °· You have bad belly (abdominal) pain. °· You have sudden puffiness (swelling) in the hands, ankles, or face. °· You gain 4 pounds (1.8 kilograms) or more in 1 week. °· You throw up (vomit) repeatedly. °· You have bleeding from the vagina. °· You do not feel the baby moving as much. °· You have a headache. °· You have blurred or double vision. °· You have muscle twitching or spasms. °· You have shortness of breath. °· You have blue fingernails and lips. °· You have blood in your pee (urine). °MAKE SURE YOU: °· Understand these instructions. °· Will watch your condition. °· Will get help right away if you are not doing well or get worse. °  °This information is not intended to replace advice given to you by your health care provider. Make sure you discuss any questions you have with your health care provider. °  °Document Released: 08/07/2010 Document Revised: 07/26/2014 Document Reviewed: 02/01/2013 °Elsevier Interactive Patient Education ©2016 [employer] ° °

## 2016-03-16 ENCOUNTER — Encounter (HOSPITAL_COMMUNITY): Payer: Self-pay

## 2016-03-16 ENCOUNTER — Other Ambulatory Visit: Payer: Self-pay | Admitting: Obstetrics & Gynecology

## 2016-03-16 DIAGNOSIS — J45909 Unspecified asthma, uncomplicated: Secondary | ICD-10-CM | POA: Insufficient documentation

## 2016-03-16 DIAGNOSIS — E559 Vitamin D deficiency, unspecified: Secondary | ICD-10-CM | POA: Insufficient documentation

## 2016-03-16 DIAGNOSIS — O99519 Diseases of the respiratory system complicating pregnancy, unspecified trimester: Secondary | ICD-10-CM

## 2016-03-16 DIAGNOSIS — O1400 Mild to moderate pre-eclampsia, unspecified trimester: Secondary | ICD-10-CM | POA: Diagnosis present

## 2016-03-16 DIAGNOSIS — B951 Streptococcus, group B, as the cause of diseases classified elsewhere: Secondary | ICD-10-CM

## 2016-03-16 HISTORY — DX: Streptococcus, group b, as the cause of diseases classified elsewhere: B95.1

## 2016-03-16 NOTE — H&P (Signed)
Kristin Bruce is a 20 y.o. female, G1P0 at 37.1 wks (as dated by sure LMP, c/w u/s at 20.3 wks), presents for IOL due to Preeclampsia w/o severe features. +FM. Denies HA, visual changes, epigastric pain, difficulty breathing, ctxs, LOF or VB.   LMP 07/01/15 EDD 04/06/16  Pt entered care with [facility] at 12.2 wks.  Prenatal course significant for:  1) Preeclampsia w/o severe features. PCR 0.34 on 03/07/16. 24 hr protein = 431. Was hospitalized from 8/17 to 8/18 @ 35+ wks due to preeclampsia. Received BMZ course and seen by MFM. On night of admission, PCR from office noted to be 0.45 w/ normal CMP & platelets. Hbg was 9.2.  2) GBS positive - NKDA.  3) Depression - Had suicide ideations in early May that resulted [facility]. Per pt report, was under a lot of stress and became depressed. Had conflict w/ mother. FOB not boyfriend. Followed at the [facility]. TSH normal at 2.93.  4) Severe Anemia - Hbg 8.1 on 03/08/16; on Iron.  5) Asthma - Uses ProAir HFA, but has not used in 2 years.  6) Vitamin D deficiency.   7) Polyhydramnios (26.34 cm; 96th%tile) on 03/05/16 per MFM; normal fluid (17.32 cm; 65th%) in office on 03/11/16.  U/S: Growth on 01/21/16 due to size less than dates = 62nd%tile w/ normal fluid. BPP on 03/11/16 @ 36.2 wks = 8/8. Anterior placenta, normal fluid, cephalic presentation, ovaries and adnexas unremarkable.   OB History    Gravida Para Term Preterm AB Living   1         0   SAB TAB Ectopic Multiple Live Births                 Past Medical History:  Diagnosis Date  . Asthma    Past Surgical History:  Procedure Laterality Date  . NO PAST SURGERIES     Family History: family history includes Diabetes in her maternal grandfather and mother. Social History:  reports that she has never smoked. She has never used smokeless tobacco. She reports that she does not drink alcohol or use drugs.     Maternal Diabetes: No Genetic Screening: Normal AFP Maternal  Ultrasounds/Referrals: Normal   Fetal Ultrasounds or other Referrals:  Referred to Materal Fetal Medicine U/S on 03/05/16 during hospitalization = EFW 6 lbs 4 ozs (2831 g; 75th%tile), AC 95th%. No structural defects demonstrated. No previa. AFI c/w polyhydramnios (26.34 cm; 96th%tile). Recs: Continue antenatal testing and weekly AFI.  Maternal Substance Abuse:  No Significant Maternal Medications:  Meds include: Other: Benzaclin 1% - 5% topical gel, Iron bid, Lidocaine 5% topical ointment, Prenate AM, ProAir HFA 90 mcg/actuation aerosol inhaler, Se-Natal 19 (w/ Docusate) Significant Maternal Lab Results:  Lab values include: Group B Strep positive, Other: Anemia Other Comments:  Tdap 01/06/16   ROS 10 Systems reviewed and are negative for acute change except as noted in the HPI.  History Dilation: 1 Effacement (%): Thick Station: -3 Exam by:: LCarpenter,RN @ 0217 Blood pressure (!) 148/84, pulse 97, temperature 97.8 F (36.6 C), temperature source Oral, resp. rate 16, height 5\' 1"  (1.549 m), weight 64 kg (141 lb), last menstrual period 07/01/2015, unknown if currently breastfeeding. Maternal Exam:  Uterine Assessment: Contraction frequency is irregular.   Abdomen: Patient reports no abdominal tenderness. Fundal height is CWD.    Introitus: Normal vulva. Normal vagina.    Fetal Exam Fetal Monitor Review: Mode: fetoscope.   Baseline rate: 140.  Variability: moderate (  6-25 bpm).   Pattern: accelerations present and no decelerations.    Fetal State Assessment: Category I - tracings are normal.    Cephalic by Leopold's EFW: 6 3/4 lbs; adequate for trial of labor Bishop score: 1  Physical Exam  Nursing noteand vitalsreviewed. Neurological: She has normal reflexes.  Constitutional: She is oriented to person, place, and time. She appears well-developed and well-nourished.  HENT:  Head: Normocephalic and atraumatic.  Neck: Normal range of motion.  Cardiovascular: Normal rate,  regular rhythm and normal heart sounds.  Respiratory: Effort normal and breath sounds normal.  GI: Soft. Bowel sounds are normal. Abdomen is gravid.  Neurological: She is alert and oriented to person, place, and time.  Skin: Skin is warm and dry.  Musculoskeletal: Exhibits 1+ lower extremity edema. Psychiatric: She has a normal mood and affect. Her behavior is normal.   Prenatal labs: ABO, Rh: --/--/O POS (08/30 0130) Antibody: NEG (08/30 0130) Rubella: Immune (09/24/15) RPR: Nonreactive (03/08 0000)  HBsAg: Neg (09/24/15)  HIV: Non-reactive (03/08 0000)  GBS: Positive (03/04/16)   Results for orders placed or performed during the hospital encounter of 03/17/16 (from the past 24 hour(s))  CBC     Status: Abnormal   Collection Time: 03/17/16  1:30 AM  Result Value Ref Range   WBC 6.9 4.0 - 10.5 K/uL   RBC 3.67 (L) 3.87 - 5.11 MIL/uL   Hemoglobin 9.4 (L) 12.0 - 15.0 g/dL   HCT 16.1 (L) 09.6 - 04.5 %   MCV 81.2 78.0 - 100.0 fL   MCH 25.6 (L) 26.0 - 34.0 pg   MCHC 31.5 30.0 - 36.0 g/dL   RDW 40.9 81.1 - 91.4 %   Platelets 267 150 - 400 K/uL  Type and screen [facility]     Status: None   Collection Time: 03/17/16  1:30 AM  Result Value Ref Range   ABO/RH(D) O POS    Antibody Screen NEG    Sample Expiration 03/20/2016   Comprehensive metabolic panel     Status: Abnormal   Collection Time: 03/17/16  1:30 AM  Result Value Ref Range   Sodium 134 (L) 135 - 145 mmol/L   Potassium 4.4 3.5 - 5.1 mmol/L   Chloride 107 101 - 111 mmol/L   CO2 20 (L) 22 - 32 mmol/L   Glucose, Bld 58 (L) 65 - 99 mg/dL   BUN 14 6 - 20 mg/dL   Creatinine, Ser 7.82 0.44 - 1.00 mg/dL   Calcium 8.5 (L) 8.9 - 10.3 mg/dL   Total Protein 6.6 6.5 - 8.1 g/dL   Albumin 2.6 (L) 3.5 - 5.0 g/dL   AST 26 15 - 41 U/L   ALT 30 14 - 54 U/L   Alkaline Phosphatase 263 (H) 38 - 126 U/L   Total Bilirubin 1.0 0.3 - 1.2 mg/dL   GFR calc non Af Amer >60 >60 mL/min   GFR calc Af Amer >60 >60 mL/min   Anion  gap 7 5 - 15  Lactate dehydrogenase     Status: Abnormal   Collection Time: 03/17/16  1:30 AM  Result Value Ref Range   LDH 227 (H) 98 - 192 U/L  Uric acid     Status: None   Collection Time: 03/17/16  1:30 AM  Result Value Ref Range   Uric Acid, Serum 6.0 2.3 - 6.6 mg/dL  Protein / creatinine ratio, urine     Status: Abnormal   Collection Time: 03/17/16  2:15 AM  Result Value Ref Range   Creatinine, Urine 104.00 mg/dL   Total Protein, Urine 213 mg/dL   Protein Creatinine Ratio 2.05 (H) 0.00 - 0.15 mg/mg[Cre]    Assessment: IUP at 37.1 wks IOL due to preE w/o severe features Latent phase labor FWB: Cat 1 GBS positive Unfavorable cvx Asthmatic H/O depression - SI in early May x1 Anemia   Per MFM on 03/05/16: Summary of Recommendations: 1. Outpatient surveillance is appropriate.  I would get a 24 hour urine collection to verify >300mg  protein or not as outpatient, noting the categorization ultimately does not affect management. 2. Treat only severe range blood pressures with antihypertensive as needed; ie, use of antihypertensives should be reserved until persistent severe range BP are documented, noting that this would then require admission until delivery as severe feature. 3. Weekly labs for preeclampsia along with weekly physical examination in your office during prenatal visits 4. Strict precautions for symptoms/signs of severe preeclampsia (patient should present for evaluation and be admitted for delivery) 5. Would assess this patient clinically weekly to detect evidence of either maternal or fetal deterioration, noting that expectant management would not be recommended if deterioration of either mom or fetus became apparent. Likewise, admission should occur for onset of severe features. 6. would initiate twice weekly NST with weekly AFI in GHTN/preeclampsia  7. fetal kick counts  8. Lastly, regardless of whether this mom and baby continue to remain stable, I would recommend  delivery by 37 weeks, noting this remains contingent upon a reassuring fetal testing and stable maternal condition without onset of severe features in the interim.  Plan: Admit to [employer] [employer]. Routine [facility] orders. Preeclampsia labs + urine PCR w/ admission labs. IV antihypertensive meds (as required) for severe range pressures. Reviewed plan of care with patient and FOB, including rationale and processes of induction, to include Cytotec, foley bulb, pitocin and AROM. Risks and benefits of induction were reviewed, including failure of method, prolonged labor, need for further intervention, and risk of cesarean. Patient and FOB seem to understand these risks and wish to proceed. In light of unfavorable cvx, will begin induction w/ Cytotec.  PCN G for GBS prophylaxis per standard dosing with ROM, active labor or initiation of Pitocin.  Consult as indicated. Social work consult post delivery due to h/o depression. Expect progress and SVD.      Sherre Scarlet 03/17/2016, 4:41 AM

## 2016-03-17 ENCOUNTER — Inpatient Hospital Stay (HOSPITAL_COMMUNITY): Payer: Medicaid Other | Admitting: Anesthesiology

## 2016-03-17 ENCOUNTER — Encounter (HOSPITAL_COMMUNITY): Payer: Self-pay | Admitting: *Deleted

## 2016-03-17 ENCOUNTER — Inpatient Hospital Stay (HOSPITAL_COMMUNITY)
Admission: AD | Admit: 2016-03-17 | Discharge: 2016-03-19 | DRG: 775 | Disposition: A | Discharge status: Home / Self Care | Payer: Medicaid Other | Source: Ambulatory Visit | Attending: Obstetrics & Gynecology | Admitting: Obstetrics & Gynecology

## 2016-03-17 DIAGNOSIS — O1410 Severe pre-eclampsia, unspecified trimester: Secondary | ICD-10-CM

## 2016-03-17 DIAGNOSIS — O99344 Other mental disorders complicating childbirth: Secondary | ICD-10-CM | POA: Diagnosis present

## 2016-03-17 DIAGNOSIS — J45909 Unspecified asthma, uncomplicated: Secondary | ICD-10-CM

## 2016-03-17 DIAGNOSIS — Z3A37 37 weeks gestation of pregnancy: Secondary | ICD-10-CM

## 2016-03-17 DIAGNOSIS — F329 Major depressive disorder, single episode, unspecified: Secondary | ICD-10-CM | POA: Diagnosis present

## 2016-03-17 DIAGNOSIS — O403XX0 Polyhydramnios, third trimester, not applicable or unspecified: Secondary | ICD-10-CM | POA: Diagnosis present

## 2016-03-17 DIAGNOSIS — F3289 Other specified depressive episodes: Secondary | ICD-10-CM | POA: Diagnosis present

## 2016-03-17 DIAGNOSIS — E559 Vitamin D deficiency, unspecified: Secondary | ICD-10-CM | POA: Diagnosis present

## 2016-03-17 DIAGNOSIS — O1400 Mild to moderate pre-eclampsia, unspecified trimester: Secondary | ICD-10-CM | POA: Diagnosis present

## 2016-03-17 DIAGNOSIS — O99824 Streptococcus B carrier state complicating childbirth: Secondary | ICD-10-CM | POA: Diagnosis present

## 2016-03-17 DIAGNOSIS — B951 Streptococcus, group B, as the cause of diseases classified elsewhere: Secondary | ICD-10-CM

## 2016-03-17 DIAGNOSIS — O9902 Anemia complicating childbirth: Secondary | ICD-10-CM | POA: Diagnosis present

## 2016-03-17 DIAGNOSIS — O99519 Diseases of the respiratory system complicating pregnancy, unspecified trimester: Secondary | ICD-10-CM

## 2016-03-17 DIAGNOSIS — O9952 Diseases of the respiratory system complicating childbirth: Secondary | ICD-10-CM | POA: Diagnosis present

## 2016-03-17 DIAGNOSIS — O1414 Severe pre-eclampsia complicating childbirth: Principal | ICD-10-CM | POA: Diagnosis present

## 2016-03-17 HISTORY — DX: Severe pre-eclampsia, unspecified trimester: O14.10

## 2016-03-17 LAB — COMPREHENSIVE METABOLIC PANEL
ALT: 30 U/L (ref 14–54)
AST: 26 U/L (ref 15–41)
Albumin: 2.6 g/dL — ABNORMAL LOW (ref 3.5–5.0)
Alkaline Phosphatase: 263 U/L — ABNORMAL HIGH (ref 38–126)
Anion gap: 7 (ref 5–15)
BUN: 14 mg/dL (ref 6–20)
CHLORIDE: 107 mmol/L (ref 101–111)
CO2: 20 mmol/L — ABNORMAL LOW (ref 22–32)
Calcium: 8.5 mg/dL — ABNORMAL LOW (ref 8.9–10.3)
Creatinine, Ser: 0.61 mg/dL (ref 0.44–1.00)
Glucose, Bld: 58 mg/dL — ABNORMAL LOW (ref 65–99)
POTASSIUM: 4.4 mmol/L (ref 3.5–5.1)
Sodium: 134 mmol/L — ABNORMAL LOW (ref 135–145)
Total Bilirubin: 1 mg/dL (ref 0.3–1.2)
Total Protein: 6.6 g/dL (ref 6.5–8.1)

## 2016-03-17 LAB — CBC
HEMATOCRIT: 29.8 % — AB (ref 36.0–46.0)
HEMATOCRIT: 30.1 % — AB (ref 36.0–46.0)
Hemoglobin: 9.4 g/dL — ABNORMAL LOW (ref 12.0–15.0)
Hemoglobin: 9.4 g/dL — ABNORMAL LOW (ref 12.0–15.0)
MCH: 25.6 pg — AB (ref 26.0–34.0)
MCH: 24.9 pg — ABNORMAL LOW (ref 26.0–34.0)
MCHC: 31.5 g/dL (ref 30.0–36.0)
MCHC: 31.2 g/dL (ref 30.0–36.0)
MCV: 81.2 fL (ref 78.0–100.0)
MCV: 79.6 fL (ref 78.0–100.0)
PLATELETS: 264 10*3/uL (ref 150–400)
Platelets: 267 10*3/uL (ref 150–400)
RBC: 3.67 MIL/uL — AB (ref 3.87–5.11)
RBC: 3.78 MIL/uL — AB (ref 3.87–5.11)
RDW: 15.1 % (ref 11.5–15.5)
RDW: 15.1 % (ref 11.5–15.5)
WBC: 6.9 10*3/uL (ref 4.0–10.5)
WBC: 8 10*3/uL (ref 4.0–10.5)

## 2016-03-17 LAB — LACTATE DEHYDROGENASE: LDH: 227 U/L — ABNORMAL HIGH (ref 98–192)

## 2016-03-17 LAB — PROTEIN / CREATININE RATIO, URINE
Creatinine, Urine: 104 mg/dL
PROTEIN CREATININE RATIO: 2.05 mg/mg{creat} — AB (ref 0.00–0.15)
TOTAL PROTEIN, URINE: 213 mg/dL

## 2016-03-17 LAB — TYPE AND SCREEN
ABO/RH(D): O POS
Antibody Screen: NEGATIVE

## 2016-03-17 LAB — URIC ACID: Uric Acid, Serum: 6 mg/dL (ref 2.3–6.6)

## 2016-03-17 LAB — RPR: RPR: NONREACTIVE

## 2016-03-17 MED ORDER — LACTATED RINGERS IV SOLN
500.0000 mL | Freq: Once | INTRAVENOUS | Status: AC
  Administered 2016-03-17: 300 mL via INTRAVENOUS

## 2016-03-17 MED ORDER — PHENYLEPHRINE 40 MCG/ML (10ML) SYRINGE FOR IV PUSH (FOR BLOOD PRESSURE SUPPORT)
80.0000 ug | PREFILLED_SYRINGE | INTRAVENOUS | Status: DC | PRN
  Filled 2016-03-17: qty 5

## 2016-03-17 MED ORDER — MAGNESIUM SULFATE BOLUS VIA INFUSION
4.0000 g | Freq: Once | INTRAVENOUS | Status: AC
  Administered 2016-03-17: 4 g via INTRAVENOUS
  Filled 2016-03-17: qty 500

## 2016-03-17 MED ORDER — ACETAMINOPHEN 325 MG PO TABS: 650.0000 mg | ORAL_TABLET | ORAL | Status: DC | PRN

## 2016-03-17 MED ORDER — OXYCODONE-ACETAMINOPHEN 5-325 MG PO TABS
1.0000 | ORAL_TABLET | ORAL | Status: DC | PRN
  Administered 2016-03-18: 1 via ORAL
  Filled 2016-03-17: qty 1

## 2016-03-17 MED ORDER — NALBUPHINE HCL 10 MG/ML IJ SOLN
5.0000 mg | INTRAMUSCULAR | Status: DC | PRN
  Administered 2016-03-17 (×2): 10 mg via INTRAVENOUS
  Filled 2016-03-17 (×2): qty 1

## 2016-03-17 MED ORDER — OXYTOCIN 40 UNITS IN LACTATED RINGERS INFUSION - SIMPLE MED
1.0000 m[IU]/min | INTRAVENOUS | Status: DC
  Administered 2016-03-17: 2 m[IU]/min via INTRAVENOUS

## 2016-03-17 MED ORDER — FENTANYL 2.5 MCG/ML BUPIVACAINE 1/10 % EPIDURAL INFUSION (WH - ANES)
14.0000 mL/h | INTRAMUSCULAR | Status: DC | PRN
  Administered 2016-03-17: 14 mL/h via EPIDURAL
  Filled 2016-03-17: qty 125

## 2016-03-17 MED ORDER — MISOPROSTOL 25 MCG QUARTER TABLET
25.0000 ug | ORAL_TABLET | ORAL | Status: DC | PRN
  Administered 2016-03-17: 25 ug via VAGINAL
  Filled 2016-03-17: qty 0.25
  Filled 2016-03-17: qty 1

## 2016-03-17 MED ORDER — LABETALOL HCL 5 MG/ML IV SOLN
20.0000 mg | INTRAVENOUS | Status: DC | PRN
  Administered 2016-03-17: 20 mg via INTRAVENOUS
  Administered 2016-03-17: 40 mg via INTRAVENOUS
  Filled 2016-03-17: qty 4
  Filled 2016-03-17: qty 8

## 2016-03-17 MED ORDER — LACTATED RINGERS IV SOLN: 500.0000 mL | Freq: Once | INTRAVENOUS | Status: DC

## 2016-03-17 MED ORDER — LIDOCAINE HCL (PF) 1 % IJ SOLN
INTRAMUSCULAR | Status: DC | PRN
  Administered 2016-03-17 (×2): 5 mL

## 2016-03-17 MED ORDER — EPHEDRINE 5 MG/ML INJ
10.0000 mg | INTRAVENOUS | Status: DC | PRN
  Filled 2016-03-17: qty 4

## 2016-03-17 MED ORDER — ONDANSETRON HCL 4 MG/2ML IJ SOLN: 4.0000 mg | Freq: Four times a day (QID) | INTRAMUSCULAR | Status: DC | PRN

## 2016-03-17 MED ORDER — HYDRALAZINE HCL 20 MG/ML IJ SOLN: 10.0000 mg | Freq: Once | INTRAMUSCULAR | Status: DC | PRN

## 2016-03-17 MED ORDER — LACTATED RINGERS IV SOLN: 500.0000 mL | INTRAVENOUS | Status: DC | PRN

## 2016-03-17 MED ORDER — OXYTOCIN 40 UNITS IN LACTATED RINGERS INFUSION - SIMPLE MED
2.5000 [IU]/h | INTRAVENOUS | Status: DC
  Filled 2016-03-17: qty 1000

## 2016-03-17 MED ORDER — DIPHENHYDRAMINE HCL 50 MG/ML IJ SOLN: 12.5000 mg | INTRAMUSCULAR | Status: DC | PRN

## 2016-03-17 MED ORDER — FLEET ENEMA 7-19 GM/118ML RE ENEM: 1.0000 | ENEMA | RECTAL | Status: DC | PRN

## 2016-03-17 MED ORDER — SOD CITRATE-CITRIC ACID 500-334 MG/5ML PO SOLN: 30.0000 mL | ORAL | Status: DC | PRN

## 2016-03-17 MED ORDER — TERBUTALINE SULFATE 1 MG/ML IJ SOLN: 0.2500 mg | Freq: Once | INTRAMUSCULAR | Status: DC | PRN

## 2016-03-17 MED ORDER — HYDROXYZINE HCL 50 MG PO TABS
50.0000 mg | ORAL_TABLET | Freq: Four times a day (QID) | ORAL | Status: DC | PRN
  Filled 2016-03-17: qty 1

## 2016-03-17 MED ORDER — ZOLPIDEM TARTRATE 5 MG PO TABS
5.0000 mg | ORAL_TABLET | Freq: Every evening | ORAL | Status: DC | PRN
  Filled 2016-03-17: qty 1

## 2016-03-17 MED ORDER — PENICILLIN G POTASSIUM 5000000 UNITS IJ SOLR
2.5000 10*6.[IU] | INTRAVENOUS | Status: DC
  Administered 2016-03-17 (×2): 2.5 10*6.[IU] via INTRAVENOUS
  Filled 2016-03-17 (×9): qty 2.5

## 2016-03-17 MED ORDER — LACTATED RINGERS IV SOLN
INTRAVENOUS | Status: DC
Start: 2016-03-17 — End: 2016-03-18
  Administered 2016-03-17: 14:00:00 via INTRAVENOUS

## 2016-03-17 MED ORDER — OXYCODONE-ACETAMINOPHEN 5-325 MG PO TABS: 2.0000 | ORAL_TABLET | ORAL | Status: DC | PRN

## 2016-03-17 MED ORDER — LIDOCAINE HCL (PF) 1 % IJ SOLN
30.0000 mL | INTRAMUSCULAR | Status: DC | PRN
  Administered 2016-03-17: 30 mL via SUBCUTANEOUS
  Filled 2016-03-17: qty 30

## 2016-03-17 MED ORDER — OXYTOCIN BOLUS FROM INFUSION
500.0000 mL | Freq: Once | INTRAVENOUS | Status: AC
  Administered 2016-03-17: 500 mL via INTRAVENOUS

## 2016-03-17 MED ORDER — MAGNESIUM SULFATE 50 % IJ SOLN
2.0000 g/h | INTRAVENOUS | Status: AC
  Administered 2016-03-18: 2 g/h via INTRAVENOUS
  Filled 2016-03-17 (×2): qty 80

## 2016-03-17 MED ORDER — IBUPROFEN 600 MG PO TABS
600.0000 mg | ORAL_TABLET | Freq: Four times a day (QID) | ORAL | Status: DC | PRN
  Administered 2016-03-17: 600 mg via ORAL
  Filled 2016-03-17: qty 1

## 2016-03-17 MED ORDER — PENICILLIN G POTASSIUM 5000000 UNITS IJ SOLR
5.0000 10*6.[IU] | Freq: Once | INTRAVENOUS | Status: AC
  Administered 2016-03-17: 5 10*6.[IU] via INTRAVENOUS
  Filled 2016-03-17: qty 5

## 2016-03-17 MED ORDER — PHENYLEPHRINE 40 MCG/ML (10ML) SYRINGE FOR IV PUSH (FOR BLOOD PRESSURE SUPPORT)
80.0000 ug | PREFILLED_SYRINGE | INTRAVENOUS | Status: DC | PRN
  Filled 2016-03-17: qty 5
  Filled 2016-03-17: qty 10

## 2016-03-17 NOTE — Progress Notes (Signed)
Kristin Bruce is a 20 y.o. G1P0 at 2138w1d admitted for induction secondary to preeclampsia.  Subjective: No complaints.  Objective: BP 135/86   Pulse 99   Temp 98.1 F (36.7 C) (Oral)   Resp 16   Ht 5\' 1"  (1.549 m)   Wt 141 lb (64 kg)   LMP 07/01/2015   Breastfeeding? Unknown   BMI 26.64 kg/m  No intake/output data recorded. No intake/output data recorded.  FHT:  FHR: 130s bpm, variability: moderate,  accelerations:  Present,  decelerations:  Absent UC:   irregular, every 2-5 minutes SVE:   Dilation: 2 Effacement (%): 60 Station: -2 Exam by:: Aibhlinn Kalmar md  Labs: Lab Results  Component Value Date   WBC 6.9 03/17/2016   HGB 9.4 (L) 03/17/2016   HCT 29.8 (L) 03/17/2016   MCV 81.2 03/17/2016   PLT 267 03/17/2016    Assessment / Plan: Induction of labor due to preeclampsia,  progressing well on pitocin  Labor: Progressing on Pitocin, will continue to increase then AROM Preeclampsia:  no signs or symptoms of toxicity Fetal Wellbeing:  Category I Pain Control:  IV pain meds I/D:  GBS + Anticipated MOD:  NSVD  Freddy Spadafora Y 03/17/2016, 12:55 PM

## 2016-03-17 NOTE — Progress Notes (Signed)
Royetta CrochetKennita L XXXJones is a 20 y.o. G1P0 at 2823w1d induction secondary to preeclampsia  Subjective: No complaints, comfortable s/p epidural.  Objective: BP (!) 143/110   Pulse 70   Temp 97.6 F (36.4 C) (Oral)   Resp 18   Ht 5\' 1"  (1.549 m)   Wt 141 lb (64 kg)   LMP 07/01/2015   SpO2 100%   Breastfeeding? Unknown   BMI 26.64 kg/m  No intake/output data recorded. No intake/output data recorded.  FHT:  FHR: 120s bpm, variability: moderate,  accelerations:  Present,  decelerations:  Absent UC:   regular, every 3 minutes SVE:   Dilation: 10 Effacement (%): 100 Station: 0 Exam by:: middleton rn  Labs: Lab Results  Component Value Date   WBC 8.0 03/17/2016   HGB 9.4 (L) 03/17/2016   HCT 30.1 (L) 03/17/2016   MCV 79.6 03/17/2016   PLT 264 03/17/2016    Assessment / Plan: Induction of labor secondary to preeclampsia progressing well.  Currently waiting for urge as pt is recently s/p placement of epidural and progressed quickly.  Labor: Progressing normally Preeclampsia:  BP elevated and getting IV labetalol Fetal Wellbeing:  Category I Pain Control:  Epidural I/D:  GBS + on PCN Anticipated MOD:  NSVD  Clare Casto Y 03/17/2016, 6:41 PM

## 2016-03-17 NOTE — Anesthesia Preprocedure Evaluation (Addendum)
Anesthesia Evaluation  Patient identified by MRN, date of birth, ID band Patient awake    Reviewed: Allergy & Precautions, NPO status , Patient's Chart, lab work & pertinent test results  Airway Mallampati: II  TM Distance: >3 FB Neck ROM: Full    Dental no notable dental hx.    Pulmonary neg pulmonary ROS,    Pulmonary exam normal breath sounds clear to auscultation       Cardiovascular hypertension (gestational), negative cardio ROS Normal cardiovascular exam Rhythm:Regular Rate:Normal     Neuro/Psych negative neurological ROS  negative psych ROS   GI/Hepatic negative GI ROS, Neg liver ROS,   Endo/Other  negative endocrine ROS  Renal/GU negative Renal ROS  negative genitourinary   Musculoskeletal negative musculoskeletal ROS (+)   Abdominal   Peds negative pediatric ROS (+)  Hematology  (+) anemia ,   Anesthesia Other Findings   Reproductive/Obstetrics negative OB ROS                            Anesthesia Physical Anesthesia Plan  ASA: II  Anesthesia Plan: Epidural   Post-op Pain Management:    Induction:   Airway Management Planned:   Additional Equipment:   Intra-op Plan:   Post-operative Plan:   Informed Consent:   Plan Discussed with:   Anesthesia Plan Comments:         Anesthesia Quick Evaluation

## 2016-03-17 NOTE — Progress Notes (Signed)
  Subjective: Started active pushing at 1910, very effective.  Family at bedside.  Objective: BP (!) 121/93   Pulse 85   Temp 98 F (36.7 C) (Oral)   Resp 18   Ht 5\' 1"  (1.549 m)   Wt 64 kg (141 lb)   LMP 07/01/2015   SpO2 100%   Breastfeeding? Unknown   BMI 26.64 kg/m  No intake/output data recorded. No intake/output data recorded.   Vitals:   03/17/16 1805 03/17/16 1812 03/17/16 1830 03/17/16 1900  BP: (!) 149/105 (!) 143/110 (!) 130/96 (!) 121/93  Pulse: 78 70 81 85  Resp: 18     Temp:   98 F (36.7 C)   TempSrc:   Oral   SpO2: 100%     Weight:      Height:       Received IV Labetalol 40 mg at 1810  FHT: Category 2--decreased variability, variable/early decels with pushing, +scalp stim rxn. UC:   regular, every 3-4 minutes SVE:   10 cm, +2 station Pitocin at 6 mu/min    Assessment:  2nd stage labor Pre-eclampsia with severe features (isolated BP parameters) GBS positive Depression Asthma Anemia  Plan: Continue active pushing. Anticipate SVB Will anticipate magnesium use pp, but will consult with Dr. Normand Sloopillard at that time.  Kristin Bruce, Kristin Bruce CNM 03/17/2016, 8:26 PM

## 2016-03-17 NOTE — Progress Notes (Signed)
S: C/O back pain. Refuses FB, but states will accept Pitocin augmentation.   O:  Today's Vitals   03/17/16 0110 03/17/16 0140 03/17/16 0220 03/17/16 0703  BP: (!) 148/84     Pulse: 97     Resp: 16  16 16   Temp:   97.8 F (36.6 C) 98.4 F (36.9 C)  TempSrc:   Oral Oral  Weight:  64 kg (141 lb)    Height:  5\' 1"  (1.549 m)     A: 20 yo G1P0 @ 37.1 wks Preeclampsia w/o severe features GBS positive Cat 1 FHRT  P: R/B/A of Pitocin augmentation reviewed; will begin 2x2.   Sherre ScarletKimberly Oanh Devivo, CNM 03/17/16, 7:19 AM

## 2016-03-17 NOTE — Anesthesia Pain Management Evaluation Note (Signed)
  [facility] Pain Management Visit Note  Patient: Kristin CrochetKennita L XXXJones, 20 y.o., female  "Hello I am a member of the anesthesia team at [facility]. We have an anesthesia team available at all times to provide care throughout the hospital, including epidural management and anesthesia for C-section. I don't know your plan for the delivery whether it a natural birth, water birth, IV sedation, nitrous supplementation, doula or epidural, but we want to meet your pain goals."   1.Was your pain managed to your expectations on prior hospitalizations?   No prior hospitalizations  2.What is your expectation for pain management during this hospitalization?     Epidural and IV pain meds  3.How can we help you reach that goal?Comfort measures, IV medications and epidural  Record the patient's initial score and the patient's pain goal.   Pain: 0  Pain Goal: 0-10 until epidural placement then 4. [facility] wants you to be able to say your pain was always managed very well.  [facility],Atara Paterson 03/17/2016

## 2016-03-17 NOTE — Anesthesia Procedure Notes (Signed)
Epidural Patient location during procedure: OB Start time: 03/17/2016 5:00 PM End time: 03/17/2016 5:09 PM  Staffing Anesthesiologist: Bonita QuinGUIDETTI, Deakon Frix S Performed: anesthesiologist   Preanesthetic Checklist Completed: patient identified, site marked, surgical consent, pre-op evaluation, timeout performed, IV checked, risks and benefits discussed and monitors and equipment checked  Epidural Patient position: sitting Prep: site prepped and draped and DuraPrep Patient monitoring: heart rate, cardiac monitor, continuous pulse ox and blood pressure Approach: midline Location: L4-L5 Injection technique: LOR air  Needle:  Needle type: Tuohy  Needle gauge: 17 G Needle length: 9 cm Needle insertion depth: 4 cm Catheter type: closed end flexible Catheter size: 19 Gauge Catheter at skin depth: 9 cm Test dose: negative  Assessment Events: blood not aspirated, injection not painful, no injection resistance, negative IV test and no paresthesia

## 2016-03-17 NOTE — Progress Notes (Signed)
Kristin Bruce is a 20 y.o. G1P0 at 6129w1d  admitted for induction of labor due to preelcampsia.  Subjective: No complaints.  Denies HA, visual changes or abdominal pain.  Objective: BP 115/69   Pulse 79   Temp 98.1 F (36.7 C) (Oral)   Resp 16   Ht 5\' 1"  (1.549 m)   Wt 141 lb (64 kg)   LMP 07/01/2015   Breastfeeding? Unknown   BMI 26.64 kg/m  No intake/output data recorded. No intake/output data recorded.  FHT:  FHR: 130s-140s bpm, variability: moderate,  accelerations:  Present,  decelerations:  Absent UC:   regular, every 3 minutes SVE:   Dilation: 2 Effacement (%): 70 Station: -2 Exam by:: Javan Gonzaga AROM clear fluid  Labs: Lab Results  Component Value Date   WBC 6.9 03/17/2016   HGB 9.4 (L) 03/17/2016   HCT 29.8 (L) 03/17/2016   MCV 81.2 03/17/2016   PLT 267 03/17/2016    Assessment / Plan: Induction of labor due to preeclampsia,  progressing well on pitocin  Labor: Progressing normally Preeclampsia:  no signs or symptoms of toxicity Fetal Wellbeing:  Category I Pain Control:  IV pain meds, epidural upon request I/D:  GBS + Anticipated MOD:  NSVD  Kristin Bruce Y 03/17/2016, 2:22 PM

## 2016-03-18 ENCOUNTER — Encounter (HOSPITAL_COMMUNITY): Payer: Self-pay

## 2016-03-18 LAB — COMPREHENSIVE METABOLIC PANEL
ALBUMIN: 1.7 g/dL — AB (ref 3.5–5.0)
ALK PHOS: 164 U/L — AB (ref 38–126)
ALT: 20 U/L (ref 14–54)
ANION GAP: 6 (ref 5–15)
AST: 27 U/L (ref 15–41)
BILIRUBIN TOTAL: 0.9 mg/dL (ref 0.3–1.2)
BUN: 13 mg/dL (ref 6–20)
CALCIUM: 7.5 mg/dL — AB (ref 8.9–10.3)
CO2: 20 mmol/L — ABNORMAL LOW (ref 22–32)
CREATININE: 0.93 mg/dL (ref 0.44–1.00)
Chloride: 107 mmol/L (ref 101–111)
GFR calc Af Amer: >60 mL/min (ref >60)
GFR calc non Af Amer: >60 mL/min (ref >60)
GLUCOSE: 83 mg/dL (ref 65–99)
Potassium: 4.8 mmol/L (ref 3.5–5.1)
Sodium: 133 mmol/L — ABNORMAL LOW (ref 135–145)
TOTAL PROTEIN: 4.7 g/dL — AB (ref 6.5–8.1)

## 2016-03-18 LAB — CBC
HEMATOCRIT: 18.9 % — AB (ref 36.0–46.0)
HEMOGLOBIN: 6.1 g/dL — AB (ref 12.0–15.0)
MCH: 25.5 pg — ABNORMAL LOW (ref 26.0–34.0)
MCHC: 32.3 g/dL (ref 30.0–36.0)
MCV: 79.1 fL (ref 78.0–100.0)
Platelets: 211 10*3/uL (ref 150–400)
RBC: 2.39 MIL/uL — ABNORMAL LOW (ref 3.87–5.11)
RDW: 15.2 % (ref 11.5–15.5)
WBC: 16.3 10*3/uL — ABNORMAL HIGH (ref 4.0–10.5)

## 2016-03-18 LAB — URIC ACID: Uric Acid, Serum: 6 mg/dL (ref 2.3–6.6)

## 2016-03-18 LAB — LACTATE DEHYDROGENASE: LDH: 227 U/L — ABNORMAL HIGH (ref 98–192)

## 2016-03-18 MED ORDER — DIPHENHYDRAMINE HCL 25 MG PO CAPS: 25.0000 mg | ORAL_CAPSULE | Freq: Four times a day (QID) | ORAL | Status: DC | PRN

## 2016-03-18 MED ORDER — ALBUTEROL SULFATE (2.5 MG/3ML) 0.083% IN NEBU: 3.0000 mL | INHALATION_SOLUTION | Freq: Four times a day (QID) | RESPIRATORY_TRACT | Status: DC | PRN

## 2016-03-18 MED ORDER — ACETAMINOPHEN 325 MG PO TABS: 650.0000 mg | ORAL_TABLET | ORAL | Status: DC | PRN

## 2016-03-18 MED ORDER — SIMETHICONE 80 MG PO CHEW: 80.0000 mg | CHEWABLE_TABLET | ORAL | Status: DC | PRN

## 2016-03-18 MED ORDER — ONDANSETRON HCL 4 MG PO TABS: 4.0000 mg | ORAL_TABLET | ORAL | Status: DC | PRN

## 2016-03-18 MED ORDER — ONDANSETRON HCL 4 MG/2ML IJ SOLN: 4.0000 mg | INTRAMUSCULAR | Status: DC | PRN

## 2016-03-18 MED ORDER — IBUPROFEN 600 MG PO TABS
600.0000 mg | ORAL_TABLET | Freq: Four times a day (QID) | ORAL | Status: DC
  Administered 2016-03-18 – 2016-03-19 (×6): 600 mg via ORAL
  Filled 2016-03-18 (×6): qty 1

## 2016-03-18 MED ORDER — PRENATAL MULTIVITAMIN CH
1.0000 | ORAL_TABLET | Freq: Every day | ORAL | Status: DC
  Administered 2016-03-18 – 2016-03-19 (×2): 1 via ORAL
  Filled 2016-03-18 (×2): qty 1

## 2016-03-18 MED ORDER — OXYCODONE HCL 5 MG PO TABS: 10.0000 mg | ORAL_TABLET | ORAL | Status: DC | PRN

## 2016-03-18 MED ORDER — ZOLPIDEM TARTRATE 5 MG PO TABS: 5.0000 mg | ORAL_TABLET | Freq: Every evening | ORAL | Status: DC | PRN

## 2016-03-18 MED ORDER — DIBUCAINE 1 % RE OINT: 1.0000 "application " | TOPICAL_OINTMENT | RECTAL | Status: DC | PRN

## 2016-03-18 MED ORDER — WITCH HAZEL-GLYCERIN EX PADS: 1.0000 "application " | MEDICATED_PAD | CUTANEOUS | Status: DC | PRN

## 2016-03-18 MED ORDER — SENNOSIDES-DOCUSATE SODIUM 8.6-50 MG PO TABS: 2.0000 | ORAL_TABLET | ORAL | Status: DC

## 2016-03-18 MED ORDER — FERROUS SULFATE 325 (65 FE) MG PO TABS
325.0000 mg | ORAL_TABLET | Freq: Two times a day (BID) | ORAL | Status: DC
  Administered 2016-03-18 – 2016-03-19 (×3): 325 mg via ORAL
  Filled 2016-03-18 (×3): qty 1

## 2016-03-18 MED ORDER — TETANUS-DIPHTH-ACELL PERTUSSIS 5-2.5-18.5 LF-MCG/0.5 IM SUSP
0.5000 mL | Freq: Once | INTRAMUSCULAR | Status: DC
  Filled 2016-03-18: qty 0.5

## 2016-03-18 MED ORDER — COCONUT OIL OIL: 1.0000 "application " | TOPICAL_OIL | Status: DC | PRN

## 2016-03-18 MED ORDER — LACTATED RINGERS IV SOLN
INTRAVENOUS | Status: DC
  Administered 2016-03-18 (×2): via INTRAVENOUS

## 2016-03-18 MED ORDER — OXYCODONE HCL 5 MG PO TABS
5.0000 mg | ORAL_TABLET | ORAL | Status: DC | PRN
  Administered 2016-03-19: 5 mg via ORAL
  Filled 2016-03-18: qty 1

## 2016-03-18 MED ORDER — BENZOCAINE-MENTHOL 20-0.5 % EX AERO
1.0000 "application " | INHALATION_SPRAY | CUTANEOUS | Status: DC | PRN
  Administered 2016-03-18 – 2016-03-19 (×2): 1 via TOPICAL
  Filled 2016-03-18 (×2): qty 56

## 2016-03-18 NOTE — Progress Notes (Signed)
Post Partum Day 1 Subjective: Denies HA, visual changes or abdominal pain.  Bleeding appropriate.  Comfortable in bed and has not really been up secondary to lethargy on mg.  Foley still in place.  Objective: Blood pressure 116/71, pulse 89, temperature 97.4 F (36.3 C), temperature source Oral, resp. rate 16, height 5\' 1"  (1.549 m), weight 141 lb (64 kg), last menstrual period 07/01/2015, SpO2 100 %, unknown if currently breastfeeding.  Physical Exam:  General: alert and no distress Lochia: appropriate Uterine Fundus: firm Incision: n/a DVT Evaluation: No evidence of DVT seen on physical exam. 1+ DTR  Recent Labs  03/17/16 1517 03/18/16 0547  HGB 9.4* 6.1*  HCT 30.1* 18.9*    Assessment/Plan: Plan for discharge tomorrow  BPs have been good.  Will observe off Mg tonight. UOP adequate and increasing over last few hours SCDs are on Cont routine PP care   LOS: 1 day   Kenzli Barritt Y 03/18/2016, 3:27 PM

## 2016-03-18 NOTE — Progress Notes (Signed)
LCSW aware of consult and MOB remains on Mag. Will assess first thing in the morning once Mag has been completed this evening.   Consult for hx of depression, SI and recent psychiatric assessment in May.  MOB was not admitted or started on medication, but cleared for discharge home with outpatient resources.  Deretha EmoryHannah Uriel Dowding LCSW, MSW Clinical Social Work: System Insurance underwriterWide Float Coverage for [employer] NICU Clinical social worker [phone]

## 2016-03-18 NOTE — Anesthesia Postprocedure Evaluation (Signed)
Anesthesia Post Note  Patient: Kristin Bruce  Procedure(s) Performed: * No procedures listed *  Patient location during evaluation: Mother Baby Anesthesia Type: Epidural Level of consciousness: awake and alert and oriented Pain management: satisfactory to patient Vital Signs Assessment: post-procedure vital signs reviewed and stable Respiratory status: spontaneous breathing and nonlabored ventilation Cardiovascular status: stable Postop Assessment: no headache, no backache, no signs of nausea or vomiting, adequate PO intake and patient able to bend at knees (patient up walking) Anesthetic complications: no     Last Vitals:  Vitals:   03/18/16 0605 03/18/16 0705  BP: 112/74   Pulse: 79   Resp: 16 16  Temp:      Last Pain:  Vitals:   03/18/16 0300  TempSrc:   PainSc: Asleep   Pain Goal:                 Madison HickmanGREGORY,Jude Linck

## 2016-03-19 MED ORDER — DROSPIRENONE-ETHINYL ESTRADIOL 3-0.03 MG PO TABS: 1.0000 | ORAL_TABLET | Freq: Every day | ORAL | 11 refills | Status: DC

## 2016-03-19 MED ORDER — SENNOSIDES-DOCUSATE SODIUM 8.6-50 MG PO TABS
2.0000 | ORAL_TABLET | ORAL | Status: DC
  Administered 2016-03-19: 2 via ORAL
  Filled 2016-03-19: qty 2

## 2016-03-19 MED ORDER — IBUPROFEN 800 MG PO TABS: 800.0000 mg | ORAL_TABLET | Freq: Three times a day (TID) | ORAL | 1 refills | Status: DC | PRN

## 2016-03-19 NOTE — Discharge Summary (Addendum)
[facility]   Patient Name:   Kristin OatsKennita L Bruce DOB:     09/22/1995 MRN:     [id number]  Date of Admission:   03/17/2016 Date of Discharge:  03/19/2016  Admitting diagnosis:    INDUCTION Principal Problem:   Vaginal delivery Active Problems:   Pre-eclampsia, Without severe features   Anemia   History of depression    Discharge diagnosis:    Same                                                               Post partum procedures: Magnesium therapy  Type of Delivery:  Vaginal delivery  Delivering Provider: Nigel BridgemanLATHAM, VICKI   Date of Delivery:  03/17/2016  Newborn Data:    Live born female  Birth Weight: 7 lb 2.3 oz (3240 g) APGAR: 9, 9  Baby's Name:  Landry CorporalShekinna Baby Feeding:   Bottle Disposition:   home with mother  Complications:   None  Hospital course:      Induction of Labor With Vaginal Delivery   20 y.o. yo G1P1001 at 4776w1d was admitted to the hospital 03/17/2016 for induction of labor.  Indication for induction: Preeclampsia.  Patient had an uncomplicated labor course as follows: Membrane Rupture Time/Date: 2:20 PM ,03/17/2016   Intrapartum Procedures: Episiotomy: None [1]                                         Lacerations:  2nd degree [3];Vaginal [6];Periurethral [8]  Patient had delivery of a Viable infant.  Information for the patient's newborn:  Levora AngelXXXJones, Girl Annisha [[id number]][[id number]]  Delivery Method: Vaginal, Spontaneous Delivery (Filed from Delivery Summary)   03/17/2016  Details of delivery can be found in separate delivery note.  Patient had a routine postpartum course. Patient is discharged home 03/19/16. The patient was treated with magnesium for 24 hours after delivery. Even though she was anemic, she was not orthostatic when she ambulated. Transfusion was discussed but declined. The patient was discharged to home on postpartum day #2.   Physical Exam:  Vitals:   03/19/16 1140 03/19/16 1142 03/19/16 1143 03/19/16 1144   BP: 118/61 (!) 103/49  118/71  Pulse: (!) 106 (!) 130  (!) 113  Resp:   15   Temp:   98.4 F (36.9 C)   TempSrc:   Oral   SpO2:      Weight:      Height:       General: alert and no distress Lochia: appropriate Uterine Fundus: firm Incision: N/A DVT Evaluation: No evidence of DVT seen on physical exam.  Labs: Lab Results  Component Value Date   WBC 16.3 (H) 03/18/2016   HGB 6.1 (LL) 03/18/2016   HCT 18.9 (L) 03/18/2016   MCV 79.1 03/18/2016   PLT 211 03/18/2016   CMP Latest Ref Rng & Units 03/18/2016  Glucose 65 - 99 mg/dL 83  BUN 6 - 20 mg/dL 13  Creatinine 5.620.44 - 1.301.00 mg/dL 8.650.93  Sodium 784135 - 696145 mmol/L 133(L)  Potassium 3.5 - 5.1 mmol/L 4.8  Chloride 101 - 111 mmol/L 107  CO2 22 - 32 mmol/L 20(L)  Calcium 8.9 - 10.3  mg/dL 7.5(L)  Total Protein 6.5 - 8.1 g/dL 4.7(L)  Total Bilirubin 0.3 - 1.2 mg/dL 0.9  Alkaline Phos 38 - 126 U/L 164(H)  AST 15 - 41 U/L 27  ALT 14 - 54 U/L 20    Discharge instruction: per After Visit Summary and "Baby and Me Booklet".  After Visit Meds:    Medication List    TAKE these medications   albuterol 108 (90 Base) MCG/ACT inhaler Commonly known as:  PROVENTIL HFA;VENTOLIN HFA Inhale 1-2 puffs into the lungs every 6 (six) hours as needed for wheezing or shortness of breath.   drospirenone-ethinyl estradiol 3-0.03 MG tablet Commonly known as:  YASMIN 28 Take 1 tablet by mouth daily.   ferrous sulfate 325 (65 FE) MG tablet Commonly known as:  FERROUSUL Take 1 tablet (325 mg total) by mouth 2 (two) times daily with a meal.   ibuprofen 800 MG tablet Commonly known as:  ADVIL,MOTRIN Take 1 tablet (800 mg total) by mouth every 8 (eight) hours as needed.   prenatal multivitamin Tabs tablet Take 1 tablet by mouth daily at 12 noon.       Diet: routine diet  Activity: Advance as tolerated. Pelvic rest for 6 weeks.   Outpatient follow up: 1 week Follow up Appt:No future appointments. Follow up visit: No Follow-up on  file.  Postpartum contraception: Combination OCPs  03/19/2016 Janine Limbo, MD

## 2016-03-19 NOTE — Progress Notes (Signed)
Discharge instructions given, questions answered, signs and given copy

## 2016-03-19 NOTE — Discharge Instructions (Signed)
Postpartum Depression and Baby Blues °The postpartum period begins right after the birth of a baby. During this time, there is often a great amount of joy and excitement. It is also a time of many changes in the life of the parents. Regardless of how many times a mother gives birth, each child brings new challenges and dynamics to the family. It is not unusual to have feelings of excitement along with confusing shifts in moods, emotions, and thoughts. All mothers are at risk of developing postpartum depression or the "baby blues." These mood changes can occur right after giving birth, or they may occur many months after giving birth. The baby blues or postpartum depression can be mild or severe. Additionally, postpartum depression can go away rather quickly, or it can be a long-term condition.  °CAUSES °Raised hormone levels and the rapid drop in those levels are thought to be a main cause of postpartum depression and the baby blues. A number of hormones change during and after pregnancy. Estrogen and progesterone usually decrease right after the delivery of your baby. The levels of thyroid hormone and various cortisol steroids also rapidly drop. Other factors that play a role in these mood changes include major life events and genetics.  °RISK FACTORS °If you have any of the following risks for the baby blues or postpartum depression, know what symptoms to watch out for during the postpartum period. Risk factors that may increase the likelihood of getting the baby blues or postpartum depression include: °· Having a personal or family history of depression.   °· Having depression while being pregnant.   °· Having premenstrual mood issues or mood issues related to oral contraceptives. °· Having a lot of life stress.   °· Having marital conflict.   °· Lacking a social support network.   °· Having a baby with special needs.   °· Having health problems, such as diabetes.   °SIGNS AND SYMPTOMS °Symptoms of baby blues  include: °· Brief changes in mood, such as going from extreme happiness to sadness. °· Decreased concentration.   °· Difficulty sleeping.   °· Crying spells, tearfulness.   °· Irritability.   °· Anxiety.   °Symptoms of postpartum depression typically begin within the first month after giving birth. These symptoms include: °· Difficulty sleeping or excessive sleepiness.   °· Marked weight loss.   °· Agitation.   °· Feelings of worthlessness.   °· Lack of interest in activity or food.   °Postpartum psychosis is a very serious condition and can be dangerous. Fortunately, it is rare. Displaying any of the following symptoms is cause for immediate medical attention. Symptoms of postpartum psychosis include:  °· Hallucinations and delusions.   °· Bizarre or disorganized behavior.   °· Confusion or disorientation.   °DIAGNOSIS  °A diagnosis is made by an evaluation of your symptoms. There are no medical or lab tests that lead to a diagnosis, but there are various questionnaires that a health care provider may use to identify those with the baby blues, postpartum depression, or psychosis. Often, a screening tool called the Edinburgh Postnatal Depression Scale is used to diagnose depression in the postpartum period.  °TREATMENT °The baby blues usually goes away on its own in 1-2 weeks. Social support is often all that is needed. You will be encouraged to get adequate sleep and rest. Occasionally, you may be given medicines to help you sleep.  °Postpartum depression requires treatment because it can last several months or longer if it is not treated. Treatment may include individual or group therapy, medicine, or both to address any social, physiological, and psychological   factors that may play a role in the depression. Regular exercise, a healthy diet, rest, and social support may also be strongly recommended.  °Postpartum psychosis is more serious and needs treatment right away. Hospitalization is often needed. °HOME CARE  INSTRUCTIONS °· Get as much rest as you can. Nap when the baby sleeps.   °· Exercise regularly. Some women find yoga and walking to be beneficial.   °· Eat a balanced and nourishing diet.   °· Do little things that you enjoy. Have a cup of tea, take a bubble bath, read your favorite magazine, or listen to your favorite music. °· Avoid alcohol.   °· Ask for help with household chores, cooking, grocery shopping, or running errands as needed. Do not try to do everything.   °· Talk to people close to you about how you are feeling. Get support from your partner, family members, friends, or other new moms. °· Try to stay positive in how you think. Think about the things you are grateful for.   °· Do not spend a lot of time alone.   °· Only take over-the-counter or prescription medicine as directed by your health care provider. °· Keep all your postpartum appointments.   °· Let your health care provider know if you have any concerns.   °SEEK MEDICAL CARE IF: °You are having a reaction to or problems with your medicine. °SEEK IMMEDIATE MEDICAL CARE IF: °· You have suicidal feelings.   °· You think you may harm the baby or someone else. °MAKE SURE YOU: °· Understand these instructions. °· Will watch your condition. °· Will get help right away if you are not doing well or get worse. °  °This information is not intended to replace advice given to you by your health care provider. Make sure you discuss any questions you have with your health care provider. °  °Document Released: 04/08/2004 Document Revised: 07/10/2013 Document Reviewed: 04/16/2013 °Elsevier Interactive Patient Education ©2016 Elsevier Inc. °Vaginal Delivery, Care After °Refer to this sheet in the next few weeks. These discharge instructions provide you with information on caring for yourself after delivery. Your health care provider may also give you specific instructions. Your treatment has been planned according to the most current medical practices  available, but problems sometimes occur. Call your health care provider if you have any problems or questions after you go home. °HOME CARE INSTRUCTIONS °· Take over-the-counter or prescription medicines only as directed by your health care provider or pharmacist. °· Do not drink alcohol, especially if you are breastfeeding or taking medicine to relieve pain. °· Do not chew or smoke tobacco. °· Do not use illegal drugs. °· Continue to use good perineal care. Good perineal care includes: °· Wiping your perineum from front to back. °· Keeping your perineum clean. °· Do not use tampons or douche until your health care provider says it is okay. °· Shower, wash your hair, and take tub baths as directed by your health care provider. °· Wear a well-fitting bra that provides breast support. °· Eat healthy foods. °· Drink enough fluids to keep your urine clear or pale yellow. °· Eat high-fiber foods such as whole grain cereals and breads, brown rice, beans, and fresh fruits and vegetables every day. These foods may help prevent or relieve constipation. °· Follow your health care provider's recommendations regarding resumption of activities such as climbing stairs, driving, lifting, exercising, or traveling. °· Talk to your health care provider about resuming sexual activities. Resumption of sexual activities is dependent upon your risk of infection, your rate   of healing, and your comfort and desire to resume sexual activity.  Try to have someone help you with your household activities and your newborn for at least a few days after you leave the hospital.  Rest as much as possible. Try to rest or take a nap when your newborn is sleeping.  Increase your activities gradually.  Keep all of your scheduled postpartum appointments. It is very important to keep your scheduled follow-up appointments. At these appointments, your health care provider will be checking to make sure that you are healing physically and  emotionally. SEEK MEDICAL CARE IF:   You are passing large clots from your vagina. Save any clots to show your health care provider.  You have a foul smelling discharge from your vagina.  You have trouble urinating.  You are urinating frequently.  You have pain when you urinate.  You have a change in your bowel movements.  You have increasing redness, pain, or swelling near your vaginal incision (episiotomy) or vaginal tear.  You have pus draining from your episiotomy or vaginal tear.  Your episiotomy or vaginal tear is separating.  You have painful, hard, or reddened breasts.  You have a severe headache.  You have blurred vision or see spots.  You feel sad or depressed.  You have thoughts of hurting yourself or your newborn.  You have questions about your care, the care of your newborn, or medicines.  You are dizzy or light-headed.  You have a rash.  You have nausea or vomiting.  You were breastfeeding and have not had a menstrual period within 12 weeks after you stopped breastfeeding.  You are not breastfeeding and have not had a menstrual period by the 12th week after delivery.  You have a fever. SEEK IMMEDIATE MEDICAL CARE IF:   You have persistent pain.  You have chest pain.  You have shortness of breath.  You faint.  You have leg pain.  You have stomach pain.  Your vaginal bleeding saturates two or more sanitary pads in 1 hour.   This information is not intended to replace advice given to you by your health care provider. Make sure you discuss any questions you have with your health care provider.   Document Released: 07/02/2000 Document Revised: 03/26/2015 Document Reviewed: 03/01/2012 Elsevier Interactive Patient Education [employer].

## 2016-03-19 NOTE — Clinical Social Work Maternal (Signed)
  CLINICAL SOCIAL WORK MATERNAL/CHILD NOTE  Patient Details  Name: Kristin Bruce MRN: [id number] Date of Birth: 01/25/1996  Date:  03/19/2016  Clinical Social Worker Initiating Note:  Laurey Arrow Date/ Time Initiated:  03/19/16/1119     Child's Name:  Rollene Rotunda   Legal Guardian:  Mother   Need for Interpreter:  None   Date of Referral:  03/19/16     Reason for Referral:  Behavioral Health Issues, including SI    Referral Source:  [facility]   Address:  [address] [place] [address]  Phone number:  [phone]   Household Members:  Self, Siblings, Significant Other   Natural Supports (not living in the home):  Immediate Family, Spouse/significant other, Parent (BF's family however BF is not FOB.)   Professional Supports: Therapist ([facility])   Employment: Unemployed   Type of Work:     Education:  Diplomatic Services operational officer Resources:  Medicaid   Other Resources:  Physicist, medical , [place] Considerations Which May Impact Care:  Per [employer] Sheet, MOB is [employer].  Strengths:  Ability to meet basic needs , Home prepared for child    Risk Factors/Current Problems:  Mental Health Concerns    Cognitive State:  Alert , Able to Concentrate , Insightful , Linear Thinking    Mood/Affect:  Happy , Interested , Comfortable , Relaxed    CSW Assessment: CSW met with MOB to complete an assessment for hx of depression and SI.  When CSW arrived MOB had to room guest; with MOB's permission, CSW asked room guest to leave in effort to meet with MOB in private.  MOB was inviting, polite, and interested in meeting with CSW.  CSW inquired about MOB's mental health hx, and MOB acknowledged in May 2017, MOB was seen in the ED.  MOB reported thoughts of SI with no plan and feelings of being overwhelmed due to lack of support of MOB's mother and the recent break-up with MOB's BF (BF is not the FOB).MOB denied having any SI  since discharge from the ED.  CSW offered MOB resources and referals for outpatient counseling and parenting education; MOB declined all information.  MOB reports feeling supported and communicated that MOB will reach out to [place] if services are needed. CSW also educated MOB about PPD.  CSW informed MOB of possible supports and interventions to decrease PPD and reviewed supports for MOB. CSW also encouraged MOB to seek medical attention if needed for increased signs and symptoms for PPD. CSW reviewed safe sleep and SIDS. MOB was knowledgeable and asked appropriate questions.  MOB communicated that MOB has everything she needs for the baby and is prepared to meet her infant's needs.  MOB did not have any further questions, concerns, or needs at this time, and CSW thanked MOB for allowing CSW to meet with MOB.  CSW Plan/Description:  Patient/Family Education , No Further Intervention Required/No Barriers to Discharge, Information/Referral to [place], MSW, [employer] Social Work [phone]   Dimple Nanas, LCSW 03/19/2016, 11:22 AM

## 2016-03-28 ENCOUNTER — Observation Stay (HOSPITAL_COMMUNITY)
Admission: AD | Admit: 2016-03-28 | Discharge: 2016-03-29 | Disposition: A | Discharge status: Home / Self Care | Payer: Medicaid Other | Source: Ambulatory Visit | Attending: Obstetrics and Gynecology | Admitting: Obstetrics and Gynecology

## 2016-03-28 ENCOUNTER — Encounter (HOSPITAL_COMMUNITY): Payer: Self-pay | Admitting: *Deleted

## 2016-03-28 DIAGNOSIS — R42 Dizziness and giddiness: Secondary | ICD-10-CM | POA: Insufficient documentation

## 2016-03-28 DIAGNOSIS — D649 Anemia, unspecified: Secondary | ICD-10-CM | POA: Insufficient documentation

## 2016-03-28 DIAGNOSIS — O9903 Anemia complicating the puerperium: Principal | ICD-10-CM | POA: Insufficient documentation

## 2016-03-28 DIAGNOSIS — N898 Other specified noninflammatory disorders of vagina: Secondary | ICD-10-CM | POA: Diagnosis not present

## 2016-03-28 DIAGNOSIS — J45909 Unspecified asthma, uncomplicated: Secondary | ICD-10-CM | POA: Insufficient documentation

## 2016-03-28 DIAGNOSIS — O9081 Anemia of the puerperium: Secondary | ICD-10-CM

## 2016-03-28 HISTORY — DX: Gestational (pregnancy-induced) hypertension without significant proteinuria, unspecified trimester: O13.9

## 2016-03-28 LAB — COMPREHENSIVE METABOLIC PANEL
ALT: 17 U/L (ref 14–54)
AST: 14 U/L — AB (ref 15–41)
Albumin: 3.4 g/dL — ABNORMAL LOW (ref 3.5–5.0)
Alkaline Phosphatase: 136 U/L — ABNORMAL HIGH (ref 38–126)
Anion gap: 7 (ref 5–15)
BILIRUBIN TOTAL: 0.4 mg/dL (ref 0.3–1.2)
BUN: 14 mg/dL (ref 6–20)
CO2: 24 mmol/L (ref 22–32)
Calcium: 8.8 mg/dL — ABNORMAL LOW (ref 8.9–10.3)
Chloride: 103 mmol/L (ref 101–111)
Creatinine, Ser: 0.69 mg/dL (ref 0.44–1.00)
GFR calc Af Amer: >60 mL/min (ref >60)
Glucose, Bld: 87 mg/dL (ref 65–99)
POTASSIUM: 4.4 mmol/L (ref 3.5–5.1)
Sodium: 134 mmol/L — ABNORMAL LOW (ref 135–145)
TOTAL PROTEIN: 7.7 g/dL (ref 6.5–8.1)

## 2016-03-28 LAB — URINALYSIS, ROUTINE W REFLEX MICROSCOPIC
Bilirubin Urine: NEGATIVE
GLUCOSE, UA: NEGATIVE mg/dL
Ketones, ur: NEGATIVE mg/dL
Nitrite: NEGATIVE
PH: 6.5 (ref 5.0–8.0)
PROTEIN: 30 mg/dL — AB
Specific Gravity, Urine: 1.01 (ref 1.005–1.030)

## 2016-03-28 LAB — CBC
HEMATOCRIT: 20.4 % — AB (ref 36.0–46.0)
Hemoglobin: 6 g/dL — CL (ref 12.0–15.0)
MCH: 23.4 pg — AB (ref 26.0–34.0)
MCHC: 29.4 g/dL — AB (ref 30.0–36.0)
MCV: 79.7 fL (ref 78.0–100.0)
Platelets: 707 10*3/uL — ABNORMAL HIGH (ref 150–400)
RBC: 2.56 MIL/uL — ABNORMAL LOW (ref 3.87–5.11)
RDW: 17.2 % — AB (ref 11.5–15.5)
WBC: 9.8 10*3/uL (ref 4.0–10.5)

## 2016-03-28 LAB — URINE MICROSCOPIC-ADD ON

## 2016-03-28 LAB — PREPARE RBC (CROSSMATCH)

## 2016-03-28 LAB — PROTEIN / CREATININE RATIO, URINE
CREATININE, URINE: 259 mg/dL
Protein Creatinine Ratio: 0.2 mg/mg{Cre} — ABNORMAL HIGH (ref 0.00–0.15)
Total Protein, Urine: 53 mg/dL

## 2016-03-28 MED ORDER — MENTHOL 3 MG MT LOZG: 1.0000 | LOZENGE | OROMUCOSAL | Status: DC | PRN

## 2016-03-28 MED ORDER — ACETAMINOPHEN 500 MG PO TABS: 1000.0000 mg | ORAL_TABLET | Freq: Four times a day (QID) | ORAL | Status: DC | PRN

## 2016-03-28 MED ORDER — ONDANSETRON HCL 4 MG/2ML IJ SOLN: 4.0000 mg | Freq: Four times a day (QID) | INTRAMUSCULAR | Status: DC | PRN

## 2016-03-28 MED ORDER — HYDROCODONE-ACETAMINOPHEN 5-325 MG PO TABS
1.0000 | ORAL_TABLET | ORAL | Status: DC | PRN
  Administered 2016-03-28: 1 via ORAL
  Filled 2016-03-28: qty 1

## 2016-03-28 MED ORDER — PRENATAL MULTIVITAMIN CH: 1.0000 | ORAL_TABLET | Freq: Every day | ORAL | Status: DC

## 2016-03-28 MED ORDER — SODIUM CHLORIDE 0.9 % IV SOLN: Freq: Once | INTRAVENOUS | Status: DC

## 2016-03-28 MED ORDER — SIMETHICONE 80 MG PO CHEW: 80.0000 mg | CHEWABLE_TABLET | Freq: Four times a day (QID) | ORAL | Status: DC | PRN

## 2016-03-28 MED ORDER — ACETAMINOPHEN 325 MG PO TABS: 650.0000 mg | ORAL_TABLET | ORAL | Status: DC | PRN

## 2016-03-28 MED ORDER — IBUPROFEN 800 MG PO TABS
800.0000 mg | ORAL_TABLET | Freq: Three times a day (TID) | ORAL | Status: DC | PRN
  Administered 2016-03-28: 800 mg via ORAL
  Filled 2016-03-28: qty 1

## 2016-03-28 MED ORDER — GUAIFENESIN 100 MG/5ML PO SOLN: 15.0000 mL | ORAL | Status: DC | PRN

## 2016-03-28 MED ORDER — ONDANSETRON HCL 4 MG PO TABS: 4.0000 mg | ORAL_TABLET | Freq: Four times a day (QID) | ORAL | Status: DC | PRN

## 2016-03-28 MED ORDER — FERROUS SULFATE 325 (65 FE) MG PO TABS
325.0000 mg | ORAL_TABLET | Freq: Two times a day (BID) | ORAL | Status: DC
  Administered 2016-03-29: 325 mg via ORAL
  Filled 2016-03-28: qty 1

## 2016-03-28 MED ORDER — DROSPIRENONE-ETHINYL ESTRADIOL 3-0.03 MG PO TABS: 1.0000 | ORAL_TABLET | Freq: Every day | ORAL | Status: DC

## 2016-03-28 NOTE — Plan of Care (Signed)
Problem: Education: Goal: Knowledge of [place] General Education information/materials will improve Outcome: Not Applicable Date Met: 66/19/69 Discussed plan of care with patient Oriented to room,routine and how to use equipment in the room Disscused signs and symptoms of a transfusion reaction Pain rating scale and available pain medication discussed. Important problems that should be reported to the MD discussed with teach back  Problem: Safety: Goal: Ability to remain free from injury will improve Outcome: Completed/Met Date Met: 03/28/16 Safety plan and calling for help when up discussed with patient.  Problem: Health Behavior/Discharge Planning: Goal: Ability to manage health-related needs will improve Outcome: Completed/Met Date Met: 03/28/16 Problems The MD should be aware of and questions answered.  Problem: Pain Managment: Goal: General experience of comfort will improve Outcome: Completed/Met Date Met: 03/28/16 Good pain control per patient with po Vicodin and Motrin.  Problem: Physical Regulation: Goal: Ability to maintain clinical measurements within normal limits will improve Outcome: Completed/Met Date Met: 03/28/16 VSS stable and afebrile at this time. Goal: Will remain free from infection Outcome: Completed/Met Date Met: 03/28/16 No signs or symptoms of infection at this time/Patient refused a pneumonia shot after discussion and handout.  Problem: Skin Integrity: Goal: Risk for impaired skin integrity will decrease Outcome: Completed/Met Date Met: 03/28/16 Patient not bed bound.Ambulatory.  Problem: Tissue Perfusion: Goal: Risk factors for ineffective tissue perfusion will decrease Outcome: Completed/Met Date Met: 03/28/16 Ambulatory.  Problem: Fluid Volume: Goal: Ability to maintain a balanced intake and output will improve Outcome: Completed/Met Date Met: 03/28/16 Intake and output are WNL.  Problem: Nutrition: Goal: Adequate nutrition will be  maintained Outcome: Completed/Met Date Met: 03/28/16 Tolerating a Regular diet well.  Problem: Bowel/Gastric: Goal: Will not experience complications related to bowel motility Outcome: Completed/Met Date Met: 03/28/16 Last normal BM 03/28/16 this AM.

## 2016-03-28 NOTE — H&P (Signed)
CSN: [id number]  Arrival date and time: 03/28/16 1308   First Provider Initiated Contact with Patient 03/28/16 1715         Chief Complaint  Patient presents with  . Dizziness  . Vaginal Pain   G1P1001 s/p SVD 11 days ago presenting with c/o dizziness x3 days and perineal pain. She reports dizziness mostly occurs with standing and ambulation. She denies SOB or feeling winded. She denies visual disturbances and epigastric pain but reports left frontal HA. She reports fatigue as well. She is eating and drinking well. She also c/o pain where stitched located. She is using not using Ibuprofen anymore as was afraid "I would overdose". She reports sitz baths aren't helping and may be making worse. Lochia is minimal. Pregnancy was complicated by Pre-e for which she had IOL.          OB History    Gravida Para Term Preterm AB Living   1 1 1     1    SAB TAB Ectopic Multiple Live Births         0 1          Past Medical History:  Diagnosis Date  . Asthma   . Pregnancy induced hypertension          Past Surgical History:  Procedure Laterality Date  . NO PAST SURGERIES           Family History  Problem Relation Age of Onset  . Diabetes Mother   . Diabetes Maternal Grandfather         Social History  Substance Use Topics  . Smoking status: Never Smoker  . Smokeless tobacco: Never Used  . Alcohol use No    Allergies: No Known Allergies         Prescriptions Prior to Admission  Medication Sig Dispense Refill Last Dose  . albuterol (PROVENTIL HFA;VENTOLIN HFA) 108 (90 Base) MCG/ACT inhaler Inhale 1-2 puffs into the lungs every 6 (six) hours as needed for wheezing or shortness of breath.   emergency at Unknown time  . drospirenone-ethinyl estradiol (YASMIN 28) 3-0.03 MG tablet Take 1 tablet by mouth daily. 1 Package 11   . ferrous sulfate (FERROUSUL) 325 (65 FE) MG tablet Take 1 tablet (325 mg total) by mouth 2 (two) times daily with a meal. 60  tablet 3 03/16/2016 at Unknown time  . ibuprofen (ADVIL,MOTRIN) 800 MG tablet Take 1 tablet (800 mg total) by mouth every 8 (eight) hours as needed. 50 tablet 1   . Prenatal Vit-Fe Fumarate-FA (PRENATAL MULTIVITAMIN) TABS tablet Take 1 tablet by mouth daily at 12 noon.   Past Month at Unknown time    Review of Systems  Constitutional: Positive for malaise/fatigue.  Gastrointestinal: Negative.  Negative for constipation.  Neurological: Positive for dizziness and headaches (left frontal).   Physical Exam   Blood pressure 113/70, pulse 117, temperature 98.5 F (36.9 C), resp. rate 18, last menstrual period 07/01/2015, unknown if currently breastfeeding.  Physical Exam  Constitutional: She is oriented to person, place, and time. She appears well-developed and well-nourished.  HENT:  Head: Normocephalic and atraumatic.  Neck: Normal range of motion. Neck supple.  Cardiovascular: Regular rhythm.   Mild tachy  Respiratory: Effort normal and breath sounds normal.  Genitourinary:    Vaginal discharge (dark red lochia, minimal) found.  Musculoskeletal: Normal range of motion.  Neurological: She is alert and oriented to person, place, and time.  Skin: Skin is warm and dry.  Psychiatric: She has a normal  mood and affect.   LabResultsLast24Hours  Results for orders placed or performed during the hospital encounter of 03/28/16 (from the past 24 hour(s))  Urinalysis, Routine w reflex microscopic (not [facility])     Status: Abnormal   Collection Time: 03/28/16  4:50 PM  Result Value Ref Range   Color, Urine YELLOW YELLOW   APPearance CLOUDY (A) CLEAR   Specific Gravity, Urine 1.010 1.005 - 1.030   pH 6.5 5.0 - 8.0   Glucose, UA NEGATIVE NEGATIVE mg/dL   Hgb urine dipstick LARGE (A) NEGATIVE   Bilirubin Urine NEGATIVE NEGATIVE   Ketones, ur NEGATIVE NEGATIVE mg/dL   Protein, ur 30 (A) NEGATIVE mg/dL   Nitrite NEGATIVE NEGATIVE   Leukocytes, UA MODERATE (A) NEGATIVE   Protein / creatinine ratio, urine     Status: Abnormal   Collection Time: 03/28/16  4:50 PM  Result Value Ref Range   Creatinine, Urine 259.00 mg/dL   Total Protein, Urine 53 mg/dL   Protein Creatinine Ratio 0.20 (H) 0.00 - 0.15 mg/mg[Cre]  Urine microscopic-add on     Status: Abnormal   Collection Time: 03/28/16  4:50 PM  Result Value Ref Range   Squamous Epithelial / LPF 6-30 (A) NONE SEEN   WBC, UA TOO NUMEROUS TO COUNT 0 - 5 WBC/hpf   RBC / HPF TOO NUMEROUS TO COUNT 0 - 5 RBC/hpf   Bacteria, UA FEW (A) NONE SEEN   Urine-Other MUCOUS PRESENT   CBC     Status: Abnormal   Collection Time: 03/28/16  5:19 PM  Result Value Ref Range   WBC 9.8 4.0 - 10.5 K/uL   RBC 2.56 (L) 3.87 - 5.11 MIL/uL   Hemoglobin 6.0 (LL) 12.0 - 15.0 g/dL   HCT 16.120.4 (L) 09.636.0 - 04.546.0 %   MCV 79.7 78.0 - 100.0 fL   MCH 23.4 (L) 26.0 - 34.0 pg   MCHC 29.4 (L) 30.0 - 36.0 g/dL   RDW 40.917.2 (H) 81.111.5 - 91.415.5 %   Platelets 707 (H) 150 - 400 K/uL  Comprehensive metabolic panel     Status: Abnormal   Collection Time: 03/28/16  5:25 PM  Result Value Ref Range   Sodium 134 (L) 135 - 145 mmol/L   Potassium 4.4 3.5 - 5.1 mmol/L   Chloride 103 101 - 111 mmol/L   CO2 24 22 - 32 mmol/L   Glucose, Bld 87 65 - 99 mg/dL   BUN 14 6 - 20 mg/dL   Creatinine, Ser 7.820.69 0.44 - 1.00 mg/dL   Calcium 8.8 (L) 8.9 - 10.3 mg/dL   Total Protein 7.7 6.5 - 8.1 g/dL   Albumin 3.4 (L) 3.5 - 5.0 g/dL   AST 14 (L) 15 - 41 U/L   ALT 17 14 - 54 U/L   Alkaline Phosphatase 136 (H) 38 - 126 U/L   Total Bilirubin 0.4 0.3 - 1.2 mg/dL   GFR calc non Af Amer >60 >60 mL/min   GFR calc Af Amer >60 >60 mL/min   Anion gap 7 5 - 15      MAU Course  Procedures  MDM Labs ordered and reviewed. Orthostatic and symptomatic with anemia. Discussed presentation, exam findings, and labs with Dr. Stefano GaulStringer. Plan for admit and transfuse.  Assessment and Plan   1. Symptomatic anemia    Admit  Symptomatic  [facility] Transfuse 2 units PRBC's Anticipate Discharge in AM  [facility] Donald Memoli, CNM 03/28/2016, 730pm

## 2016-03-28 NOTE — MAU Provider Note (Signed)
History     CSN: [id number]  Arrival date and time: 03/28/16 [id number]   First Provider Initiated Contact with Patient 03/28/16 1715      Chief Complaint  Patient presents with  . Dizziness  . Vaginal Pain   [id number] s/p SVD 11 days ago presenting with c/o dizziness x3 days and perineal pain. She reports dizziness mostly occurs with standing and ambulation. She denies SOB or feeling winded. She denies visual disturbances and epigastric pain but reports left frontal HA. She reports fatigue as well. She is eating and drinking well. She also c/o pain where stitched located. She is using not using Ibuprofen anymore as was afraid "I would overdose". She reports sitz baths aren't helping and may be making worse. Lochia is minimal. Pregnancy was complicated by Pre-e for which she had IOL.   OB History    Gravida Para Term Preterm AB Living   1 1 1     1    SAB TAB Ectopic Multiple Live Births         0 1      Past Medical History:  Diagnosis Date  . Asthma   . Pregnancy induced hypertension     Past Surgical History:  Procedure Laterality Date  . NO PAST SURGERIES      Family History  Problem Relation Age of Onset  . Diabetes Mother   . Diabetes Maternal Grandfather     Social History  Substance Use Topics  . Smoking status: Never Smoker  . Smokeless tobacco: Never Used  . Alcohol use No    Allergies: No Known Allergies  Prescriptions Prior to Admission  Medication Sig Dispense Refill Last Dose  . albuterol (PROVENTIL HFA;VENTOLIN HFA) 108 (90 Base) MCG/ACT inhaler Inhale 1-2 puffs into the lungs every 6 (six) hours as needed for wheezing or shortness of breath.   emergency at Unknown time  . drospirenone-ethinyl estradiol (YASMIN 28) 3-0.03 MG tablet Take 1 tablet by mouth daily. 1 Package 11   . ferrous sulfate (FERROUSUL) 325 (65 FE) MG tablet Take 1 tablet (325 mg total) by mouth 2 (two) times daily with a meal. 60 tablet 3 03/16/2016 at Unknown time  . ibuprofen  (ADVIL,MOTRIN) 800 MG tablet Take 1 tablet (800 mg total) by mouth every 8 (eight) hours as needed. 50 tablet 1   . Prenatal Vit-Fe Fumarate-FA (PRENATAL MULTIVITAMIN) TABS tablet Take 1 tablet by mouth daily at 12 noon.   Past Month at Unknown time    Review of Systems  Constitutional: Positive for malaise/fatigue.  Gastrointestinal: Negative.  Negative for constipation.  Neurological: Positive for dizziness and headaches (left frontal).   Physical Exam   Blood pressure 113/70, pulse 117, temperature 98.5 F (36.9 C), resp. rate 18, last menstrual period 07/01/2015, unknown if currently breastfeeding.  Physical Exam  Constitutional: She is oriented to person, place, and time. She appears well-developed and well-nourished.  HENT:  Head: Normocephalic and atraumatic.  Neck: Normal range of motion. Neck supple.  Cardiovascular: Regular rhythm.   Mild tachy  Respiratory: Effort normal and breath sounds normal.  Genitourinary:    Vaginal discharge (dark red lochia, minimal) found.  Musculoskeletal: Normal range of motion.  Neurological: She is alert and oriented to person, place, and time.  Skin: Skin is warm and dry.  Psychiatric: She has a normal mood and affect.   Results for orders placed or performed during the hospital encounter of 03/28/16 (from the past 24 hour(s))  Urinalysis, Routine w reflex microscopic (not at  [facility])     Status: Abnormal   Collection Time: 03/28/16  4:50 PM  Result Value Ref Range   Color, Urine YELLOW YELLOW   APPearance CLOUDY (A) CLEAR   Specific Gravity, Urine 1.010 1.005 - 1.030   pH 6.5 5.0 - 8.0   Glucose, UA NEGATIVE NEGATIVE mg/dL   Hgb urine dipstick LARGE (A) NEGATIVE   Bilirubin Urine NEGATIVE NEGATIVE   Ketones, ur NEGATIVE NEGATIVE mg/dL   Protein, ur 30 (A) NEGATIVE mg/dL   Nitrite NEGATIVE NEGATIVE   Leukocytes, UA MODERATE (A) NEGATIVE  Protein / creatinine ratio, urine     Status: Abnormal   Collection Time: 03/28/16  4:50 PM    Result Value Ref Range   Creatinine, Urine 259.00 mg/dL   Total Protein, Urine 53 mg/dL   Protein Creatinine Ratio 0.20 (H) 0.00 - 0.15 mg/mg[Cre]  Urine microscopic-add on     Status: Abnormal   Collection Time: 03/28/16  4:50 PM  Result Value Ref Range   Squamous Epithelial / LPF 6-30 (A) NONE SEEN   WBC, UA TOO NUMEROUS TO COUNT 0 - 5 WBC/hpf   RBC / HPF TOO NUMEROUS TO COUNT 0 - 5 RBC/hpf   Bacteria, UA FEW (A) NONE SEEN   Urine-Other MUCOUS PRESENT   CBC     Status: Abnormal   Collection Time: 03/28/16  5:19 PM  Result Value Ref Range   WBC 9.8 4.0 - 10.5 K/uL   RBC 2.56 (L) 3.87 - 5.11 MIL/uL   Hemoglobin 6.0 (LL) 12.0 - 15.0 g/dL   HCT 14.7 (L) 82.9 - 56.2 %   MCV 79.7 78.0 - 100.0 fL   MCH 23.4 (L) 26.0 - 34.0 pg   MCHC 29.4 (L) 30.0 - 36.0 g/dL   RDW 13.0 (H) 86.5 - 78.4 %   Platelets 707 (H) 150 - 400 K/uL  Comprehensive metabolic panel     Status: Abnormal   Collection Time: 03/28/16  5:25 PM  Result Value Ref Range   Sodium 134 (L) 135 - 145 mmol/L   Potassium 4.4 3.5 - 5.1 mmol/L   Chloride 103 101 - 111 mmol/L   CO2 24 22 - 32 mmol/L   Glucose, Bld 87 65 - 99 mg/dL   BUN 14 6 - 20 mg/dL   Creatinine, Ser 6.96 0.44 - 1.00 mg/dL   Calcium 8.8 (L) 8.9 - 10.3 mg/dL   Total Protein 7.7 6.5 - 8.1 g/dL   Albumin 3.4 (L) 3.5 - 5.0 g/dL   AST 14 (L) 15 - 41 U/L   ALT 17 14 - 54 U/L   Alkaline Phosphatase 136 (H) 38 - 126 U/L   Total Bilirubin 0.4 0.3 - 1.2 mg/dL   GFR calc non Af Amer >60 >60 mL/min   GFR calc Af Amer >60 >60 mL/min   Anion gap 7 5 - 15    MAU Course  Procedures  MDM Labs ordered and reviewed. Orthostatic and symptomatic with anemia. Discussed presentation, exam findings, and labs with Dr. Stefano Gaul. Plan for admit and transfuse.  Assessment and Plan   1. Symptomatic anemia    Admit Transfuse Dr. Stefano Gaul to manage  Donette Larry, CNM 03/28/2016, 5:23 PM

## 2016-03-28 NOTE — Discharge Instructions (Signed)
Blood Transfusion, Care After °These instructions give you information about caring for yourself after your procedure. Your doctor may also give you more specific instructions. Call your doctor if you have any problems or questions after your procedure.  °HOME CARE °· Take medicines only as told by your doctor. Ask your doctor if you can take an over-the-counter pain reliever if you have a fever or headache a day or two after your procedure. °· Return to your normal activities as told by your doctor. °GET HELP IF:  °· You develop redness or irritation at your IV site. °· You have a fever, chills, or a headache that does not go away. °· Your pee (urine) is darker than normal. °· Your urine turns: °· Pink. °· Red. °· Brown. °· The white part of your eye turns yellow (jaundice). °· You feel weak after doing your normal activities. °GET HELP RIGHT AWAY IF:  °· You have trouble breathing. °· You have fever and chills and you also have: °· Anxiety. °· Chest or back pain. °· Flushed or pink skin. °· Clammy or sweaty skin. °· A fast heartbeat. °· A sick feeling in your stomach (nausea). °  °This information is not intended to replace advice given to you by your health care provider. Make sure you discuss any questions you have with your health care provider. °  °Document Released: 07/26/2014 Document Reviewed: 07/26/2014 °Elsevier Interactive Patient Education ©2016 Elsevier Inc. ° °Anemia, Nonspecific °Anemia is a condition in which the concentration of red blood cells or hemoglobin in the blood is below normal. Hemoglobin is a substance in red blood cells that carries oxygen to the tissues of the body. Anemia results in not enough oxygen reaching these tissues.  °CAUSES  °Common causes of anemia include:  °· Excessive bleeding. Bleeding may be internal or external. This includes excessive bleeding from periods (in women) or from the intestine.   °· Poor nutrition.   °· Chronic kidney, thyroid, and liver disease.  °· Bone  marrow disorders that decrease red blood cell production. °· Cancer and treatments for cancer. °· HIV, AIDS, and their treatments. °· Spleen problems that increase red blood cell destruction. °· Blood disorders. °· Excess destruction of red blood cells due to infection, medicines, and autoimmune disorders. °SIGNS AND SYMPTOMS  °· Minor weakness.   °· Dizziness.   °· Headache. °· Palpitations.   °· Shortness of breath, especially with exercise.   °· Paleness. °· Cold sensitivity. °· Indigestion. °· Nausea. °· Difficulty sleeping. °· Difficulty concentrating. °Symptoms may occur suddenly or they may develop slowly.  °DIAGNOSIS  °Additional blood tests are often needed. These help your health care provider determine the best treatment. Your health care provider will check your stool for blood and look for other causes of blood loss.  °TREATMENT  °Treatment varies depending on the cause of the anemia. Treatment can include:  °· Supplements of iron, vitamin B12, or folic acid.   °· Hormone medicines.   °· A blood transfusion. This may be needed if blood loss is severe.   °· Hospitalization. This may be needed if there is significant continual blood loss.   °· Dietary changes. °· Spleen removal. °HOME CARE INSTRUCTIONS °Keep all follow-up appointments. It often takes many weeks to correct anemia, and having your health care provider check on your condition and your response to treatment is very important. °SEEK IMMEDIATE MEDICAL CARE IF:  °· You develop extreme weakness, shortness of breath, or chest pain.   °· You become dizzy or have trouble concentrating. °· You develop heavy vaginal   bleeding.   °· You develop a rash.   °· You have bloody or black, tarry stools.   °· You faint.   °· You vomit up blood.   °· You vomit repeatedly.   °· You have abdominal pain. °· You have a fever or persistent symptoms for more than 2-3 days.   °· You have a fever and your symptoms suddenly get worse.   °· You are dehydrated.   °MAKE  SURE YOU: °· Understand these instructions. °· Will watch your condition. °· Will get help right away if you are not doing well or get worse. °  °This information is not intended to replace advice given to you by your health care provider. Make sure you discuss any questions you have with your health care provider. °  °Document Released: 08/12/2004 Document Revised: 03/07/2013 Document Reviewed: 12/29/2012 °Elsevier Interactive Patient Education ©2016 Elsevier Inc. ° °

## 2016-03-28 NOTE — MAU Note (Signed)
Vaginal delivery on 8/30, pt C/O dizziness for 3-4 days, feels like she is going to faint.  Has pain with stitches, was seen by MD last week & they said the stitches were ok, to try soaking in the tub.  Pt feels like the pain is worse since trying the tub.

## 2016-03-28 NOTE — Discharge Summary (Signed)
Obstetric Discharge Summary  Reason for Admission: Symptomatic anemia Postpartum day 11 Postpartum Procedures: transfusion 2 units PRBC's Complications-Operative and Postpartum: symptomatic anemia  Hemoglobin  Date Value Ref Range Status  03/28/2016 6.0 (LL) 12.0 - 15.0 g/dL Final    Comment:    REPEATED TO VERIFY CRITICAL RESULT CALLED TO, READ BACK BY AND VERIFIED WITH: HARRIS,D. @ 1757 ON 9.10.17 BY BOSTONC    HCT  Date Value Ref Range Status  03/28/2016 20.4 (L) 36.0 - 46.0 % Final    Discharge Diagnoses: Anemia  Physical exam:   General: normal Lochia: appropriate Uterine Fundus: u/1 firm non-tender  Extremities: No evidence of DVT seen on physical exam.    Hospital course: Pt is eating and talking with family member in room. She will receive 2 units of PRBC's overnight and cbc will be checked at 500am. If stable plan for discharge in the morning. She has a return postpartum visit scheduled in the office on October 12.  Date: Activity: unrestricted Diet: routine Medications: PNV and Iron Condition:   Instructions: .anemia Discharge to: home Follow-up Information    [facility] on [facility]/30/2016.   Specialty:  Obstetrics and Gynecology Contact information: [address] [place] [id number] [phone]          Rhea PinkLori A Solana Coggin CNM 03/28/2016, 8:48 PM

## 2016-03-29 LAB — TYPE AND SCREEN
ABO/RH(D): O POS
Antibody Screen: NEGATIVE
UNIT DIVISION: 0
Unit division: 0

## 2016-03-29 LAB — CBC
HCT: 26.4 % — ABNORMAL LOW (ref 36.0–46.0)
Hemoglobin: 8.5 g/dL — ABNORMAL LOW (ref 12.0–15.0)
MCH: 26 pg (ref 26.0–34.0)
MCHC: 32.2 g/dL (ref 30.0–36.0)
MCV: 80.7 fL (ref 78.0–100.0)
PLATELETS: 571 10*3/uL — AB (ref 150–400)
RBC: 3.27 MIL/uL — ABNORMAL LOW (ref 3.87–5.11)
RDW: 16.2 % — ABNORMAL HIGH (ref 11.5–15.5)
WBC: 10.1 10*3/uL (ref 4.0–10.5)

## 2016-03-29 NOTE — Progress Notes (Signed)
Pt is discharged in the care of Mother,with R.N. Escort. Denies any pain or discomfort. Small amt of vaginal bleeding noted on V-Pad. No equipment needed for home use. Stable. Discharged per ambulatory.

## 2016-05-05 NOTE — Discharge Summary (Signed)
    OB Discharge Summary     Patient Name: Kristin OatsKennita L Bruce DOB: 01-01-1996 MRN: [id number]  Date of admission: 03/04/2016 Delivering MD: This patient has no babies on file.  Date of discharge: 05/05/2016  Admitting diagnosis: 35 WEEKS PRE ECLAMPSIA EVAL Intrauterine pregnancy: [id number]     Secondary diagnosis:  Active Problems:   Mild preeclampsia  Additional problems: see above     Discharge diagnosis: Preeclampsia (mild)                                                                                                Post partum procedures:na  Augmentation: na  Complications: None  Hospital course:  Pt was admitted for preeclampsia without severe features.  She had normal growth and MFM agreed she could be managed as an out patient  Physical exam  Vitals:   03/05/16 0059 03/05/16 0813 03/05/16 1224 03/05/16 1430  BP: 122/71 133/83  123/73  Pulse: 87 80  92  Resp:  16 16 16   Temp:  98.4 F (36.9 C) 98.1 F (36.7 C)   TempSrc:  Oral Oral   Weight:      Height:       General: alert Lochia: na Uterine Fundus: soft Incision: na DVT Evaluation: No evidence of DVT seen on physical exam. Labs: Lab Results  Component Value Date   WBC 10.1 03/29/2016   HGB 8.5 (L) 03/29/2016   HCT 26.4 (L) 03/29/2016   MCV 80.7 03/29/2016   PLT 571 (H) 03/29/2016   CMP Latest Ref Rng & Units 03/28/2016  Glucose 65 - 99 mg/dL 87  BUN 6 - 20 mg/dL 14  Creatinine 4.090.44 - 8.111.00 mg/dL 9.140.69  Sodium 782135 - 956145 mmol/L 134(L)  Potassium 3.5 - 5.1 mmol/L 4.4  Chloride 101 - 111 mmol/L 103  CO2 22 - 32 mmol/L 24  Calcium 8.9 - 10.3 mg/dL 2.1(H8.8(L)  Total Protein 6.5 - 8.1 g/dL 7.7  Total Bilirubin 0.3 - 1.2 mg/dL 0.4  Alkaline Phos 38 - 126 U/L 136(H)  AST 15 - 41 U/L 14(L)  ALT 14 - 54 U/L 17    Discharge instruction: per After Visit Summary and "Baby and Me Booklet".  After visit meds:    Medication List    TAKE these medications   albuterol 108 (90 Base) MCG/ACT inhaler Commonly known  as:  PROVENTIL HFA;VENTOLIN HFA Inhale 1-2 puffs into the lungs every 6 (six) hours as needed for wheezing or shortness of breath.       Diet: routine diet  Activity: Advance as tolerated. Pelvic rest for 6 weeks.   Outpatient follow up:one week Follow up Appt:No future appointments. Follow up Visit:No Follow-up on file.  Postpartum contraception: None  Newborn Data: This patient has no babies on file. Baby Feeding: na Disposition:mom still pregnant   05/05/2016 Jaymes GraffILLARD,Wylma Tatem A, MD

## 2016-05-19 ENCOUNTER — Encounter (HOSPITAL_COMMUNITY): Payer: Self-pay | Admitting: Emergency Medicine

## 2016-05-19 ENCOUNTER — Emergency Department (HOSPITAL_COMMUNITY): Payer: Medicaid Other

## 2016-05-19 ENCOUNTER — Emergency Department (HOSPITAL_COMMUNITY)
Admission: EM | Admit: 2016-05-19 | Discharge: 2016-05-19 | Disposition: A | Discharge status: Home / Self Care | Payer: Medicaid Other | Attending: Emergency Medicine | Admitting: Emergency Medicine

## 2016-05-19 DIAGNOSIS — Z7982 Long term (current) use of aspirin: Secondary | ICD-10-CM | POA: Diagnosis not present

## 2016-05-19 DIAGNOSIS — R072 Precordial pain: Secondary | ICD-10-CM | POA: Insufficient documentation

## 2016-05-19 DIAGNOSIS — J45909 Unspecified asthma, uncomplicated: Secondary | ICD-10-CM | POA: Diagnosis not present

## 2016-05-19 DIAGNOSIS — Z79899 Other long term (current) drug therapy: Secondary | ICD-10-CM | POA: Diagnosis not present

## 2016-05-19 DIAGNOSIS — R079 Chest pain, unspecified: Secondary | ICD-10-CM

## 2016-05-19 LAB — BASIC METABOLIC PANEL
Anion gap: 8 (ref 5–15)
BUN: 14 mg/dL (ref 6–20)
CHLORIDE: 109 mmol/L (ref 101–111)
CO2: 20 mmol/L — ABNORMAL LOW (ref 22–32)
CREATININE: 0.8 mg/dL (ref 0.44–1.00)
Calcium: 8.7 mg/dL — ABNORMAL LOW (ref 8.9–10.3)
GFR calc Af Amer: >60 mL/min (ref >60)
GLUCOSE: 78 mg/dL (ref 65–99)
Potassium: 3.3 mmol/L — ABNORMAL LOW (ref 3.5–5.1)
SODIUM: 137 mmol/L (ref 135–145)

## 2016-05-19 LAB — CBC
HCT: 32.7 % — ABNORMAL LOW (ref 36.0–46.0)
Hemoglobin: 10.2 g/dL — ABNORMAL LOW (ref 12.0–15.0)
MCH: 24.1 pg — ABNORMAL LOW (ref 26.0–34.0)
MCHC: 31.2 g/dL (ref 30.0–36.0)
MCV: 77.3 fL — AB (ref 78.0–100.0)
PLATELETS: 510 10*3/uL — AB (ref 150–400)
RBC: 4.23 MIL/uL (ref 3.87–5.11)
RDW: 18.5 % — AB (ref 11.5–15.5)
WBC: 6 10*3/uL (ref 4.0–10.5)

## 2016-05-19 LAB — I-STAT TROPONIN, ED: Troponin i, poc: 0 ng/mL (ref 0.00–0.08)

## 2016-05-19 LAB — D-DIMER, QUANTITATIVE (NOT AT ARMC)

## 2016-05-19 MED ORDER — POTASSIUM CHLORIDE CRYS ER 20 MEQ PO TBCR
40.0000 meq | EXTENDED_RELEASE_TABLET | Freq: Once | ORAL | Status: AC
  Administered 2016-05-19: 40 meq via ORAL
  Filled 2016-05-19: qty 2

## 2016-05-19 MED ORDER — KETOROLAC TROMETHAMINE 30 MG/ML IJ SOLN
15.0000 mg | Freq: Once | INTRAMUSCULAR | Status: AC
  Administered 2016-05-19: 15 mg via INTRAVENOUS
  Filled 2016-05-19: qty 1

## 2016-05-19 MED ORDER — NAPROXEN 500 MG PO TABS: 500.0000 mg | ORAL_TABLET | Freq: Two times a day (BID) | ORAL | 0 refills | Status: DC

## 2016-05-19 NOTE — Discharge Instructions (Signed)
Take your medication as prescribed as needed for chest pain. Recommend following up with your primary care provider within the next week if symptoms have not improved. Please return to the Emergency Department if symptoms worsen or new onset of fever, cough, shortness of breath, difficult to breathing, new/worsening chest pain, heart palpitations, abdominal pain, vomiting.

## 2016-05-19 NOTE — ED Provider Notes (Signed)
[facility]-EMERGENCY DEPT Provider Note   CSN: [id number] Arrival date & time: 05/19/16  [id number]     History   Chief Complaint Chief Complaint  Patient presents with  . Chest Pain    HPI Kristin Bruce is a 20 y.o. female.  Patient's a 20 year old female with history of asthma and preeclampsia during her recent pregnancy (vaginal delivery on 03/17/16) presents to the ED with complaint of chest pain. Patient reports she woke up this morning around 4 AM due to having constant sharp midsternal chest pain. She notes the pain is worse with movement or when taking a deep breath. Denies radiation. She reports taking a generic brand of aspirin or ibuprofen at home without relief. Denies history of similar symptoms. Denies any recent injury, heavy lifting, new exercises or activities. Denies fever, chills, headache, lightheadedness, dizziness, cough, hemoptysis, wheezing, shortness of breath, palpitations, abdominal pain, nausea, vomiting, numbness, tingling, weakness, leg swelling. Patient denies any recent surgery or trauma, history of PE/DVT. Endorses taking an oral contraceptive. Patient denies personal or family history of heart disease. Denies smoking.      Past Medical History:  Diagnosis Date  . Asthma   . Preeclampsia   . Pregnancy induced hypertension     Patient Active Problem List   Diagnosis Date Noted  . Symptomatic anemia 03/28/2016  . Anemia 03/28/2016  . Vaginal delivery 03/17/2016  . Pre-eclampsia, severe 03/17/2016  . Mild preeclampsia 03/16/2016  . Positive GBS test 03/16/2016  . Asthma affecting pregnancy, antepartum 03/16/2016  . Vitamin D deficiency 03/16/2016  . Anemia of pregnancy 01/07/2016  . Adjustment disorder with depressed mood 11/22/2015    Past Surgical History:  Procedure Laterality Date  . NO PAST SURGERIES      OB History    Gravida Para Term Preterm AB Living   1 1 1     1    SAB TAB Ectopic Multiple Live Births         0 1       Home  Medications    Prior to Admission medications   Medication Sig Start Date End Date Taking? Authorizing Provider  albuterol (PROVENTIL HFA;VENTOLIN HFA) 108 (90 Base) MCG/ACT inhaler Inhale 1-2 puffs into the lungs every 6 (six) hours as needed for wheezing or shortness of breath.   Yes Historical Provider, MD  aspirin 325 MG tablet Take 650 mg by mouth every 4 (four) hours as needed (for chest pain).   Yes Historical Provider, MD  norethindrone-ethinyl estradiol (JUNEL FE,GILDESS FE,LOESTRIN FE) 1-20 MG-MCG tablet Take 1 tablet by mouth daily.   Yes Historical Provider, MD  naproxen (NAPROSYN) 500 MG tablet Take 1 tablet (500 mg total) by mouth 2 (two) times daily. 05/19/16   Barrett HenleNicole Elizabeth Shaneequa Bahner, PA-C    Family History Family History  Problem Relation Age of Onset  . Diabetes Mother   . Diabetes Maternal Grandfather     Social History Social History  Substance Use Topics  . Smoking status: Never Smoker  . Smokeless tobacco: Never Used  . Alcohol use No     Allergies   Review of patient's allergies indicates no known allergies.   Review of Systems Review of Systems  Cardiovascular: Positive for chest pain.  All other systems reviewed and are negative.    Physical Exam Updated Vital Signs BP 122/86 (BP Location: Left Arm)   Pulse 85   Temp 97.9 F (36.6 C) (Oral)   Resp 16   LMP 05/19/2016   SpO2 97%  Physical Exam  Constitutional: She is oriented to person, place, and time. She appears well-developed and well-nourished. No distress.  HENT:  Head: Normocephalic and atraumatic.  Mouth/Throat: Oropharynx is clear and moist. No oropharyngeal exudate.  Eyes: Conjunctivae and EOM are normal. Right eye exhibits no discharge. Left eye exhibits no discharge. No scleral icterus.  Neck: Normal range of motion. Neck supple.  Cardiovascular: Normal rate, regular rhythm, normal heart sounds and intact distal pulses.   Pulmonary/Chest: Effort normal and breath sounds  normal. No respiratory distress. She has no wheezes. She has no rales. She exhibits tenderness (midsternal chest wall TTP).  Abdominal: Soft. Bowel sounds are normal. She exhibits no distension and no mass. There is no tenderness. There is no rebound and no guarding. No hernia.  Musculoskeletal: Normal range of motion. She exhibits no edema.  Neurological: She is alert and oriented to person, place, and time. She has normal strength. No sensory deficit.  Skin: Skin is warm and dry. She is not diaphoretic.  Nursing note and vitals reviewed.    ED Treatments / Results  Labs (all labs ordered are listed, but only abnormal results are displayed) Labs Reviewed  BASIC METABOLIC PANEL - Abnormal; Notable for the following:       Result Value   Potassium 3.3 (*)    CO2 20 (*)    Calcium 8.7 (*)    All other components within normal limits  CBC - Abnormal; Notable for the following:    Hemoglobin 10.2 (*)    HCT 32.7 (*)    MCV 77.3 (*)    MCH 24.1 (*)    RDW 18.5 (*)    Platelets 510 (*)    All other components within normal limits  D-DIMER, QUANTITATIVE (NOT AT [facility])  I-STAT TROPOININ, ED    EKG  EKG Interpretation  Date/Time:  Wednesday May 19 2016 09:28:58 EDT Ventricular Rate:  89 PR Interval:    QRS Duration: 79 QT Interval:  365 QTC Calculation: 445 R Axis:   45 Text Interpretation:  Sinus rhythm Borderline short PR interval Abnormal Q suggests anterior infarct Borderline T abnormalities, diffuse leads Baseline wander in lead(s) II III aVF V2 V5 V6 Inverted T in lead V3.  No prior ECG for comparison.  Abnormal ECG.  No STEMI Confirmed by Rush LandmarkEGELER MD, CHRISTOPHER [phone]([id number]) on 05/19/2016 9:40:41 AM       Radiology Dg Chest 2 View  Result Date: 05/19/2016 CLINICAL DATA:  Chest pain EXAM: CHEST  2 VIEW COMPARISON:  None. FINDINGS: Cardiac and mediastinal contours normal. Vascularity normal. Lungs are clear without infiltrate or effusion. No mass or adenopathy. Lumbar  levoscoliosis. IMPRESSION: No active cardiopulmonary disease. Electronically Signed   By: Marlan Palauharles  Clark M.D.   On: 05/19/2016 09:46    Procedures Procedures (including critical care time)  Medications Ordered in ED Medications  ketorolac (TORADOL) 30 MG/ML injection 15 mg (15 mg Intravenous Given 05/19/16 1046)  potassium chloride SA (K-DUR,KLOR-CON) CR tablet 40 mEq (40 mEq Oral Given 05/19/16 1227)     Initial Impression / Assessment and Plan / ED Course  I have reviewed the triage vital signs and the nursing notes.  Pertinent labs & imaging results that were available during my care of the patient were reviewed by me and considered in my medical decision making (see chart for details).  Clinical Course    Patient presents with chest pain that started around 4 AM this morning. Pain is worse with movement or when taking a deep breath.  Denies fever, cough or shortness of breath, leg swelling, recent surgery or trauma, history of DVT/PE. Endorses taking oral contraceptives. VSS. Exam revealed tenderness over midsternal chest wall, lungs clear to auscultation bilaterally, remaining exam unremarkable. Patient given Toradol in the ED. EKG showed sinus rhythm with no acute ischemic changes. Troponin negative. D-dimer negative. K 3.3, pt given oral potassium supplement in the ED. On reevaluation patient reports her chest pain has improved. HEART score 0. I suspect pt's sxs are likley due to musculoskeletal chest wall pain. I have a low suspicion for ACS, PE, dissection, or other acute cardiac event at this time. Plan to discharge patient home with symptomatic treatment including NSAIDs. Advised patient to follow up with her PCP. Discussed return precautions.  Final Clinical Impressions(s) / ED Diagnoses   Final diagnoses:  Chest pain, unspecified type    New Prescriptions New Prescriptions   NAPROXEN (NAPROSYN) 500 MG TABLET    Take 1 tablet (500 mg total) by mouth 2 (two) times daily.       Satira Sark [place], [place] 05/19/16 1236    Canary Brim Tegeler, MD 05/19/16 [id number]

## 2016-05-19 NOTE — ED Triage Notes (Addendum)
Patient reports right-sided chest pain starting last night becoming more severe at 4am. Describes the pain as a sharp/shooting pain. Denies shortness of breath, nausea, vomiting. Reports increased pain when breathing in.

## 2016-06-16 ENCOUNTER — Emergency Department (HOSPITAL_COMMUNITY)
Admission: EM | Admit: 2016-06-16 | Discharge: 2016-06-16 | Disposition: A | Discharge status: Home / Self Care | Payer: Medicaid Other | Attending: Emergency Medicine | Admitting: Emergency Medicine

## 2016-06-16 ENCOUNTER — Encounter (HOSPITAL_COMMUNITY): Payer: Self-pay | Admitting: Emergency Medicine

## 2016-06-16 DIAGNOSIS — Z7982 Long term (current) use of aspirin: Secondary | ICD-10-CM | POA: Insufficient documentation

## 2016-06-16 DIAGNOSIS — J45909 Unspecified asthma, uncomplicated: Secondary | ICD-10-CM | POA: Insufficient documentation

## 2016-06-16 DIAGNOSIS — J029 Acute pharyngitis, unspecified: Secondary | ICD-10-CM

## 2016-06-16 LAB — RAPID STREP SCREEN (MED CTR MEBANE ONLY): Streptococcus, Group A Screen (Direct): NEGATIVE

## 2016-06-16 MED ORDER — PSEUDOEPHEDRINE HCL 60 MG PO TABS: 60.0000 mg | ORAL_TABLET | Freq: Four times a day (QID) | ORAL | 0 refills | Status: DC | PRN

## 2016-06-16 MED ORDER — LORATADINE 10 MG PO TABS: 10.0000 mg | ORAL_TABLET | Freq: Every day | ORAL | 0 refills | Status: DC | PRN

## 2016-06-16 MED ORDER — IBUPROFEN 800 MG PO TABS: 800.0000 mg | ORAL_TABLET | Freq: Three times a day (TID) | ORAL | 0 refills | Status: DC | PRN

## 2016-06-16 NOTE — ED Triage Notes (Signed)
Patient reports sore throat and bilateral ear pain since last night. Denies cough, congestion, N/V/D, and abdominal pain.

## 2016-06-16 NOTE — ED Provider Notes (Signed)
[facility]-EMERGENCY DEPT Provider Note   CSN: [id number] Arrival date & time: 06/16/16  1713  By signing my name below, I, Placido SouLogan Joldersma, attest that this documentation has been prepared under the direction and in the presence of [facility] Tex[facility] HospitalEmily Vivia Rosenburg, PA-C. Electronically Signed: Placido SouLogan Joldersma, ED Scribe. 06/16/16. 7:27 PM.    History   Chief Complaint Chief Complaint  Patient presents with  . Sore Throat    HPI HPI Comments: Kristin Bruce is a 20 y.o. female who presents to the Emergency Department complaining of constant, moderate, sore throat x 1 day. She reports associated congestion, rhinorrhea and a radiation of her pain to her bilateral ears. She has used cough drops w/o significant relief. She has a 283 month old child. Pt is not currently breastfeeding. Pt denies fevers, chills, cough or other associated symptoms at this time.   The history is provided by the patient. No language interpreter was used.    Past Medical History:  Diagnosis Date  . Asthma   . Preeclampsia   . Pregnancy induced hypertension     Patient Active Problem List   Diagnosis Date Noted  . Symptomatic anemia 03/28/2016  . Anemia 03/28/2016  . Vaginal delivery 03/17/2016  . Pre-eclampsia, severe 03/17/2016  . Mild preeclampsia 03/16/2016  . Positive GBS test 03/16/2016  . Asthma affecting pregnancy, antepartum 03/16/2016  . Vitamin D deficiency 03/16/2016  . Anemia of pregnancy 01/07/2016  . Adjustment disorder with depressed mood 11/22/2015    Past Surgical History:  Procedure Laterality Date  . NO PAST SURGERIES      OB History    Gravida Para Term Preterm AB Living   1 1 1     1    SAB TAB Ectopic Multiple Live Births         0 1       Home Medications    Prior to Admission medications   Medication Sig Start Date End Date Taking? Authorizing Provider  albuterol (PROVENTIL HFA;VENTOLIN HFA) 108 (90 Base) MCG/ACT inhaler Inhale 1-2 puffs into the lungs every 6 (six) hours as needed for  wheezing or shortness of breath.    Historical Provider, MD  aspirin 325 MG tablet Take 650 mg by mouth every 4 (four) hours as needed (for chest pain).    Historical Provider, MD  ibuprofen (ADVIL,MOTRIN) 800 MG tablet Take 1 tablet (800 mg total) by mouth every 8 (eight) hours as needed. 06/16/16   Trixie DredgeEmily Curran Lenderman, PA-C  loratadine (CLARITIN) 10 MG tablet Take 1 tablet (10 mg total) by mouth daily as needed for rhinitis. 06/16/16   Trixie DredgeEmily Keland Peyton, PA-C  naproxen (NAPROSYN) 500 MG tablet Take 1 tablet (500 mg total) by mouth 2 (two) times daily. 05/19/16   Barrett HenleNicole Elizabeth Nadeau, PA-C  norethindrone-ethinyl estradiol (JUNEL FE,GILDESS FE,LOESTRIN FE) 1-20 MG-MCG tablet Take 1 tablet by mouth daily.    Historical Provider, MD  pseudoephedrine (SUDAFED) 60 MG tablet Take 1 tablet (60 mg total) by mouth every 6 (six) hours as needed for congestion. 06/16/16   Trixie DredgeEmily Branden Vine, PA-C    Family History Family History  Problem Relation Age of Onset  . Diabetes Mother   . Diabetes Maternal Grandfather     Social History Social History  Substance Use Topics  . Smoking status: Never Smoker  . Smokeless tobacco: Never Used  . Alcohol use No     Allergies   Patient has no known allergies.   Review of Systems Review of Systems  Constitutional: Negative for chills and  fever.  HENT: Positive for congestion, ear pain, rhinorrhea and sore throat. Negative for trouble swallowing.   Respiratory: Negative for cough and shortness of breath.   Cardiovascular: Negative for chest pain.  Musculoskeletal: Negative for neck pain and neck stiffness.  Skin: Negative for rash.  Allergic/Immunologic: Negative for immunocompromised state.   Physical Exam Updated Vital Signs BP 106/67 (BP Location: Left Arm)   Pulse 99   Temp 98.8 F (37.1 C) (Oral)   Resp 20   Ht 5\' 1"  (1.549 m)   Wt 48.5 kg   LMP 05/25/2016   SpO2 100%   BMI 20.22 kg/m   Physical Exam  Constitutional: She appears well-developed and  well-nourished. No distress.  HENT:  Head: Normocephalic and atraumatic.  Mouth/Throat: Oropharynx is clear and moist. No oropharyngeal exudate.  Symmetric bilateral tonsillar enlargement. No erythema, exudates or PTA.   Eyes: Conjunctivae are normal.  Neck: Neck supple.  Cardiovascular: Normal rate and regular rhythm.   Pulmonary/Chest: Effort normal and breath sounds normal. No respiratory distress. She has no wheezes. She has no rales.  Neurological: She is alert.  Skin: She is not diaphoretic.  Nursing note and vitals reviewed.  ED Treatments / Results  Labs (all labs ordered are listed, but only abnormal results are displayed) Labs Reviewed  RAPID STREP SCREEN (NOT AT [facility])  CULTURE, GROUP A STREP [facility])    EKG  EKG Interpretation None       Radiology No results found.  Procedures Procedures  DIAGNOSTIC STUDIES: Oxygen Saturation is 100% on RA, normal by my interpretation.    COORDINATION OF CARE: 7:27 PM Discussed next steps with pt. Pt verbalized understanding and is agreeable with the plan.    Medications Ordered in ED Medications - No data to display   Initial Impression / Assessment and Plan / ED Course  I have reviewed the triage vital signs and the nursing notes.  Pertinent labs & imaging results that were available during my care of the patient were reviewed by me and considered in my medical decision making (see chart for details).  Clinical Course     Pt with negative strep. Diagnosis of viral pharyngitis. No abx indicated at this time. Discussed that results of strep culture are pending and patient will be informed if positive result and abx will be called in at that time. Discharge with symptomatic tx. No evidence of dehydration. Pt is tolerating secretions. Presentation not concerning for peritonsillar abscess or spread of infection to deep spaces of the throat; patent airway. Specific return precautions discussed. Recommended PCP follow up. Pt  appears safe for discharge.  I personally performed the services described in this documentation, which was scribed in my presence. The recorded information has been reviewed and is accurate.  Final Clinical Impressions(s) / ED Diagnoses   Final diagnoses:  Viral pharyngitis    New Prescriptions Discharge Medication List as of 06/16/2016  7:34 PM    START taking these medications   Details  ibuprofen (ADVIL,MOTRIN) 800 MG tablet Take 1 tablet (800 mg total) by mouth every 8 (eight) hours as needed., Starting Wed 06/16/2016, Print    loratadine (CLARITIN) 10 MG tablet Take 1 tablet (10 mg total) by mouth daily as needed for rhinitis., Starting Wed 06/16/2016, Print    pseudoephedrine (SUDAFED) 60 MG tablet Take 1 tablet (60 mg total) by mouth every 6 (six) hours as needed for congestion., Starting Wed 06/16/2016, Print         WebberEmily Mykal Kirchman, [place] JerseyPA-C 06/16/16 2021  Benjiman Core, MD 06/16/16 [phone]

## 2016-06-16 NOTE — Discharge Instructions (Signed)
Read the information below.  Use the prescribed medication as directed.  Please discuss all new medications with your pharmacist.  You may return to the Emergency Department at any time for worsening condition or any new symptoms that concern you.  If you develop high fevers that do not resolve with tylenol or ibuprofen, you have difficulty swallowing or breathing, or you are unable to tolerate fluids by mouth, return to the ER for a recheck.    °

## 2016-06-18 LAB — CULTURE, GROUP A STREP (THRC)

## 2016-06-19 ENCOUNTER — Telehealth: Payer: Self-pay

## 2016-06-19 NOTE — Progress Notes (Signed)
ED Antimicrobial Stewardship Positive Culture Follow Up   Kristin Bruce is an 20 y.o. female [facility] with a chief complaint of  Chief Complaint  Patient presents with  . Sore Throat    Recent Results (from the past 720 hour(s))  Rapid strep screen     Status: None   Collection Time: 06/16/16  5:30 PM  Result Value Ref Range Status   Streptococcus, Group A Screen (Direct) NEGATIVE NEGATIVE Final    Comment: (NOTE) A Rapid Antigen test may result negative if the antigen level in the sample is below the detection level of this test. The FDA has not cleared this test as a stand-alone test therefore the rapid antigen negative result has reflexed to a Group A Strep culture.   Culture, group A strep     Status: None   Collection Time: 06/16/16  5:30 PM  Result Value Ref Range Status   Specimen Description THROAT  Final   Special Requests NONE Reflexed from [id number]  Final   Culture FEW GROUP A STREP (S.PYOGENES) ISOLATED  Final   Report Status 06/18/2016 FINAL  Final    []  Treated with N/A, organism resistant to prescribed antimicrobial [x]  Patient discharged originally without antimicrobial agent and treatment is now indicated  New antibiotic prescription: IF pt is symptomatic, start amoxicillin 500mg  PO BID x 10 days  ED Provider: Everlene FarrierWilliam Dansie, PA   [name], [name] 06/19/2016, 9:29 AM Clinical Pharmacist Phone# [phone]

## 2016-06-19 NOTE — Telephone Encounter (Signed)
Post ED Visit - Positive Culture Follow-up: Successful Patient Follow-Up  Culture assessed and recommendations reviewed by: []  Enzo BiNathan Batchelder, Pharm.D. []  Celedonio MiyamotoJeremy Frens, Pharm.D., BCPS []  Garvin FilaMike Maccia, Pharm.D. []  Georgina PillionElizabeth Martin, Pharm.D., BCPS []  [place] ShoreMinh Pham, [address] Rainbow BoulevardPharm.D., BCPS, AAHIVP []  Estella HuskMichelle Turner, Pharm.D., BCPS, AAHIVP []  Tennis Mustassie Stewart, Pharm.D. []  Sherle Poeob Vincent, Vermont[place]arm[place]D. Rachel Rumburger Pharm D Positive strep culture  [x]  Patient discharged without antimicrobial prescription and treatment is now indicated []  Organism is resistant to prescribed ED discharge antimicrobial []  Patient with positive blood cultures  Changes discussed with ED provider: Everlene Farrieransie, William New antibiotic prescription amoxicillin 500 mg BID x 10 days [facility] [phone]  Contacted patient, date 06/19/16, time 1033   Jerry CarasCullom, Margeart Allender Burnett 06/19/2016, 10:32 AM

## 2016-08-02 ENCOUNTER — Emergency Department (HOSPITAL_COMMUNITY): Payer: Medicaid Other

## 2016-08-02 ENCOUNTER — Emergency Department (HOSPITAL_COMMUNITY)
Admission: EM | Admit: 2016-08-02 | Discharge: 2016-08-02 | Disposition: A | Discharge status: Home / Self Care | Payer: Medicaid Other | Attending: Emergency Medicine | Admitting: Emergency Medicine

## 2016-08-02 ENCOUNTER — Encounter (HOSPITAL_COMMUNITY): Payer: Self-pay

## 2016-08-02 DIAGNOSIS — Y939 Activity, unspecified: Secondary | ICD-10-CM | POA: Insufficient documentation

## 2016-08-02 DIAGNOSIS — Z7982 Long term (current) use of aspirin: Secondary | ICD-10-CM | POA: Diagnosis not present

## 2016-08-02 DIAGNOSIS — Y999 Unspecified external cause status: Secondary | ICD-10-CM | POA: Insufficient documentation

## 2016-08-02 DIAGNOSIS — J45909 Unspecified asthma, uncomplicated: Secondary | ICD-10-CM | POA: Diagnosis not present

## 2016-08-02 DIAGNOSIS — S46911A Strain of unspecified muscle, fascia and tendon at shoulder and upper arm level, right arm, initial encounter: Secondary | ICD-10-CM | POA: Diagnosis not present

## 2016-08-02 DIAGNOSIS — Z79899 Other long term (current) drug therapy: Secondary | ICD-10-CM | POA: Insufficient documentation

## 2016-08-02 DIAGNOSIS — Y92410 Unspecified street and highway as the place of occurrence of the external cause: Secondary | ICD-10-CM | POA: Insufficient documentation

## 2016-08-02 DIAGNOSIS — S4991XA Unspecified injury of right shoulder and upper arm, initial encounter: Secondary | ICD-10-CM | POA: Diagnosis present

## 2016-08-02 MED ORDER — IBUPROFEN 400 MG PO TABS
400.0000 mg | ORAL_TABLET | Freq: Once | ORAL | Status: AC
  Administered 2016-08-02: 400 mg via ORAL
  Filled 2016-08-02: qty 1

## 2016-08-02 MED ORDER — IBUPROFEN 600 MG PO TABS: 600.0000 mg | ORAL_TABLET | Freq: Four times a day (QID) | ORAL | 0 refills | Status: DC | PRN
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
## 2017-01-21 ENCOUNTER — Encounter (HOSPITAL_COMMUNITY): Payer: Self-pay

## 2017-01-21 ENCOUNTER — Emergency Department (HOSPITAL_COMMUNITY)
Admission: EM | Admit: 2017-01-21 | Discharge: 2017-01-21 | Disposition: A | Discharge status: Home / Self Care | Payer: Medicaid Other | Attending: Emergency Medicine | Admitting: Emergency Medicine

## 2017-01-21 DIAGNOSIS — L02415 Cutaneous abscess of right lower limb: Secondary | ICD-10-CM | POA: Insufficient documentation

## 2017-01-21 DIAGNOSIS — I10 Essential (primary) hypertension: Secondary | ICD-10-CM | POA: Diagnosis not present

## 2017-01-21 DIAGNOSIS — L0291 Cutaneous abscess, unspecified: Secondary | ICD-10-CM

## 2017-01-21 DIAGNOSIS — Z791 Long term (current) use of non-steroidal anti-inflammatories (NSAID): Secondary | ICD-10-CM | POA: Insufficient documentation

## 2017-01-21 DIAGNOSIS — Z79899 Other long term (current) drug therapy: Secondary | ICD-10-CM | POA: Insufficient documentation

## 2017-01-21 DIAGNOSIS — J45909 Unspecified asthma, uncomplicated: Secondary | ICD-10-CM | POA: Diagnosis not present

## 2017-01-21 MED ORDER — LIDOCAINE-EPINEPHRINE (PF) 2 %-1:200000 IJ SOLN
10.0000 mL | Freq: Once | INTRAMUSCULAR | Status: AC
  Administered 2017-01-21: 10 mL
  Filled 2017-01-21: qty 20

## 2017-01-21 MED ORDER — BACITRACIN ZINC 500 UNIT/GM EX OINT
TOPICAL_OINTMENT | Freq: Two times a day (BID) | CUTANEOUS | Status: DC
  Administered 2017-01-21: 1 via TOPICAL

## 2017-01-21 NOTE — ED Triage Notes (Signed)
Patient c/o insect bite a week ago. Patient states the area has been getting bigger and more painful.

## 2017-01-21 NOTE — ED Provider Notes (Signed)
[facility]-EMERGENCY DEPT Provider Note    By signing my name below, I, Kristin Bruce, attest that this documentation has been prepared under the direction and in the presence of Kristin Carbin, PA-C. Electronically Signed: Earmon PhoenixJennifer Bruce, ED Scribe. 01/21/17. 3:46 PM.    History   Chief Complaint Chief Complaint  Patient presents with  . Insect Bite   The history is provided by the patient and medical records. No language interpreter was used.    Kristin OatsKennita L Gandolfi is a 21 y.o. female who presents to the Emergency Department complaining of an abscess to the anterior RLE just below the knee that appeared five days ago. She reports associated itching and pain. She states the area drained clear fluid but has now stopped. She states the area has gotten bigger since onset. She denies seeing anything bite her. She has applied warm compresses and an "insect bite relief cream" for her symptoms. There are no modifying factors. She denies fever, chills, nausea, vomiting, abdominal pain. She reports h/o abscesses. Her last tetanus vaccination was four years ago.   Past Medical History:  Diagnosis Date  . Asthma   . Preeclampsia   . Pregnancy induced hypertension     Patient Active Problem List   Diagnosis Date Noted  . Symptomatic anemia 03/28/2016  . Anemia 03/28/2016  . Vaginal delivery 03/17/2016  . Pre-eclampsia, severe 03/17/2016  . Mild preeclampsia 03/16/2016  . Positive GBS test 03/16/2016  . Asthma affecting pregnancy, antepartum 03/16/2016  . Vitamin D deficiency 03/16/2016  . Anemia of pregnancy 01/07/2016  . Adjustment disorder with depressed mood 11/22/2015    Past Surgical History:  Procedure Laterality Date  . NO PAST SURGERIES      OB History    Gravida Para Term Preterm AB Living   1 1 1     1    SAB TAB Ectopic Multiple Live Births         0 1       Home Medications    Prior to Admission medications   Medication Sig Start Date End Date Taking?  Authorizing Provider  albuterol (PROVENTIL HFA;VENTOLIN HFA) 108 (90 Base) MCG/ACT inhaler Inhale 1-2 puffs into the lungs every 6 (six) hours as needed for wheezing or shortness of breath.    [provider]  aspirin 325 MG tablet Take 650 mg by mouth every 4 (four) hours as needed (for chest pain).    [provider]  ibuprofen (ADVIL,MOTRIN) 600 MG tablet Take 1 tablet (600 mg total) by mouth every 6 (six) hours as needed. 08/02/16   Mesner, Barbara CowerJason, MD  loratadine (CLARITIN) 10 MG tablet Take 1 tablet (10 mg total) by mouth daily as needed for rhinitis. 06/16/16   Trixie DredgeWest, Emily, PA-C  naproxen (NAPROSYN) 500 MG tablet Take 1 tablet (500 mg total) by mouth 2 (two) times daily. 05/19/16   Barrett HenleNadeau, Nicole Elizabeth, PA-C  norethindrone-ethinyl estradiol (JUNEL FE,GILDESS FE,LOESTRIN FE) 1-20 MG-MCG tablet Take 1 tablet by mouth daily.    [provider]  pseudoephedrine (SUDAFED) 60 MG tablet Take 1 tablet (60 mg total) by mouth every 6 (six) hours as needed for congestion. 06/16/16   Trixie DredgeWest, Emily, PA-C    Family History Family History  Problem Relation Age of Onset  . Diabetes Mother   . Diabetes Maternal Grandfather     Social History Social History  Substance Use Topics  . Smoking status: Never Smoker  . Smokeless tobacco: Never Used  . Alcohol use No  Allergies   Patient has no known allergies.   Review of Systems Review of Systems  Constitutional: Negative for chills and fever.  Gastrointestinal: Negative for abdominal pain, nausea and vomiting.  Skin: Positive for color change.     Physical Exam Updated Vital Signs BP 116/81 (BP Location: Right Arm)   Pulse (!) 111   Temp 98.3 F (36.8 C) (Oral)   Resp 16   Ht 5' (1.524 m)   Wt 97 lb (44 kg)   LMP 12/28/2016   SpO2 100%   BMI 18.94 kg/m   Physical Exam  Constitutional: She is oriented to person, place, and time. She appears well-developed and well-nourished.  HENT:  Head:  Normocephalic and atraumatic.  Neck: Normal range of motion.  Cardiovascular: Normal rate.   Pulmonary/Chest: Effort normal.  Musculoskeletal: Normal range of motion.  Neurological: She is alert and oriented to person, place, and time.  Skin: Skin is warm and dry.  Right anterior lower extremity shows approximately 1 inch fluctuant, well defined lesion with overlying superficial blister. Area is TTP. No surrounding erythema or signs of cellulitis.  Psychiatric: She has a normal mood and affect. Her behavior is normal.  Nursing note and vitals reviewed.    ED Treatments / Results  DIAGNOSTIC STUDIES: Oxygen Saturation is 100% on RA, normal by my interpretation.   COORDINATION OF CARE: 2:59 PM- Will incise and drain abscess. Pt verbalizes understanding and agrees to plan.  Medications  bacitracin ointment (1 application Topical Given 01/21/17 1609)  lidocaine-EPINEPHrine (XYLOCAINE W/EPI) 2 %-1:200000 (PF) injection 10 mL (10 mLs Infiltration Given 01/21/17 1522)    Labs (all labs ordered are listed, but only abnormal results are displayed) Labs Reviewed - No data to display  EKG  EKG Interpretation None       Radiology No results found.  Procedures .Marland KitchenIncision and Drainage Date/Time: 01/21/2017 3:34 PM Performed by: Alveria Apley Authorized by: Alveria Apley   Consent:    Consent obtained:  Verbal   Consent given by:  Patient   Alternatives discussed:  No treatment Location:    Type:  Abscess   Size:  1 inch   Location:  Lower extremity   Lower extremity location:  Leg   Leg location:  R lower leg Pre-procedure details:    Skin preparation:  Betadine Anesthesia (see MAR for exact dosages):    Anesthesia method:  Local infiltration Procedure type:    Complexity:  Complex Procedure details:    Needle aspiration: no     Incision types:  Single straight   Incision depth:  Subcutaneous   Scalpel blade:  11   Wound management:  Probed and deloculated and  irrigated with saline   Drainage:  Purulent   Drainage amount:  Moderate   Wound treatment:  Wound left open   Packing materials:  None Post-procedure details:    Patient tolerance of procedure:  Tolerated well, no immediate complications     (including critical care time)  Medications Ordered in ED Medications  bacitracin ointment (1 application Topical Given 01/21/17 1609)  lidocaine-EPINEPHrine (XYLOCAINE W/EPI) 2 %-1:200000 (PF) injection 10 mL (10 mLs Infiltration Given 01/21/17 1522)     Initial Impression / Assessment and Plan / ED Course  I have reviewed the triage vital signs and the nursing notes.  Pertinent labs & imaging results that were available during my care of the patient were reviewed by me and considered in my medical decision making (see chart for details).     Patient  with skin abscess. Incision and drainage performed in the ED today. Abscess was not large enough to warrant packing or drain placement. Wound recheck in 3 days as needed. Supportive care and return precautions discussed. No evidence of surrounding cellulitis, will not prescribe antibiotic at this time. The patient appears reasonably screened and/or stabilized for discharge and I doubt any other emergent medical condition requiring further screening, evaluation, or treatment in the ED prior to discharge. Patient states she understands and agrees to plan.   Final Clinical Impressions(s) / ED Diagnoses   Final diagnoses:  Abscess    New Prescriptions Discharge Medication List as of 01/21/2017  3:48 PM      I personally performed the services described in this documentation, which was scribed in my presence. The recorded information has been reviewed and is accurate.     Alveria Apley, PA-C 01/21/17 1624    Doug Sou, MD 01/21/17 [phone]

## 2017-01-21 NOTE — Discharge Instructions (Signed)
You may use Tylenol or ibuprofen as needed for pain. You may follow-up with primary care for wound evaluation in 3 days as needed. Return to the emergency department if you develop fever, chills, surrounding redness, or worsening pain.

## 2017-02-23 ENCOUNTER — Inpatient Hospital Stay (HOSPITAL_COMMUNITY)
Admission: AD | Admit: 2017-02-23 | Discharge: 2017-02-23 | Disposition: A | Discharge status: Home / Self Care | Payer: Medicaid Other | Source: Ambulatory Visit | Attending: Obstetrics and Gynecology | Admitting: Obstetrics and Gynecology

## 2017-02-23 ENCOUNTER — Ambulatory Visit (INDEPENDENT_AMBULATORY_CARE_PROVIDER_SITE_OTHER): Payer: Medicaid Other | Admitting: *Deleted

## 2017-02-23 DIAGNOSIS — N912 Amenorrhea, unspecified: Secondary | ICD-10-CM | POA: Insufficient documentation

## 2017-02-23 DIAGNOSIS — Z3202 Encounter for pregnancy test, result negative: Secondary | ICD-10-CM

## 2017-02-23 LAB — POCT PREGNANCY, URINE: Preg Test, Ur: NEGATIVE

## 2017-02-23 NOTE — Progress Notes (Signed)
Here for pregnancy test today which was negative.. States period was due a week ago. States is taking her birth control pills every days but always gets a period during the off week ( doesn't take placebos- justs doesn't take any pills that week).  States she doesn't really have pregnancy symptoms but wants a blood test because she doesn't want to be drinking alcohol if she is pregnant. Discussed with Dr. Alysia PennaErvin and bhcg ordered. Instructed patient to call for results in 2 days.

## 2017-02-23 NOTE — MAU Provider Note (Signed)
Ms. Kristin Bruce is a 21 y.o. G1P1001 who present to MAU today for pregnancy confirmation. She denies abdominal pain or vaginal bleeding.   BP 127/83   Pulse (!) 117   Temp 98.4 F (36.9 C)   Resp 18   Ht 5' (1.524 m)   Wt 107 lb (48.5 kg)   LMP 01/26/2017   BMI 20.90 kg/m  CONSTITUTIONAL: Well-developed, well-nourished female in no acute distress.  CARDIOVASCULAR: Normal heart rate noted RESPIRATORY: Effort and breath sounds normal GASTROINTESTINAL:Soft, no distention noted.  No tenderness, rebound or guarding.  SKIN: Skin is warm and dry. No rash noted. Not diaphoretic. No erythema. No pallor. PSYCHIATRIC: Normal mood and affect. Normal behavior. Normal judgment and thought content.  MDM Medical screening exam complete Patient does not endorse any symptoms concerning for ectopic pregnancy or pregnancy related complication today.   A:  Amenorrhea  P: Discharge home Patient advised that she can present as a walk-in to [facility] for a pregnancy test M-Th between 8am-4pm or Friday between 8am -11am Reasons to return to MAU reviewed  Patient may return to MAU as needed or if her condition were to change or worsen  Kristin Bruce, Kristin Garlington, NP 02/23/2017 12:20 PM

## 2017-02-23 NOTE — MAU Note (Signed)
Pt reports her period is a week late. Taking PCP. Negative pregnancy test at home.Deneis any other problems.

## 2017-02-23 NOTE — MAU Note (Signed)
[facility] for [facility]..Marland Kitchen

## 2017-02-24 LAB — BETA HCG QUANT (REF LAB): hCG Quant: <1 m[IU]/mL

## 2017-02-25 ENCOUNTER — Telehealth (HOSPITAL_COMMUNITY): Payer: Self-pay | Admitting: *Deleted

## 2017-02-25 NOTE — Telephone Encounter (Signed)
Dala DockKennita Taborn called the call a nurse line and was then transferred to our line calling to get her hCG Quant results. The patient stated she was seen in the clinics on 02/23/17 and had not received a call back to inform her of her results. After using two identifiers to identify the pt I Rockwell GermanyLauren Fields RN informed her that her hCG was less than 1 indicating she was not pregnant. Pt stated she had no further questions and has complete understanding.

## 2017-05-07 ENCOUNTER — Encounter (HOSPITAL_COMMUNITY): Payer: Self-pay

## 2017-05-07 ENCOUNTER — Emergency Department (HOSPITAL_COMMUNITY)
Admission: EM | Admit: 2017-05-07 | Discharge: 2017-05-07 | Disposition: A | Discharge status: Home / Self Care | Payer: Medicaid Other | Attending: Emergency Medicine | Admitting: Emergency Medicine

## 2017-05-07 DIAGNOSIS — Z3202 Encounter for pregnancy test, result negative: Secondary | ICD-10-CM | POA: Insufficient documentation

## 2017-05-07 DIAGNOSIS — N926 Irregular menstruation, unspecified: Secondary | ICD-10-CM | POA: Diagnosis present

## 2017-05-07 DIAGNOSIS — J45909 Unspecified asthma, uncomplicated: Secondary | ICD-10-CM | POA: Diagnosis not present

## 2017-05-07 DIAGNOSIS — Z79899 Other long term (current) drug therapy: Secondary | ICD-10-CM | POA: Diagnosis not present

## 2017-05-07 LAB — URINALYSIS, ROUTINE W REFLEX MICROSCOPIC
Bilirubin Urine: NEGATIVE
Glucose, UA: NEGATIVE mg/dL
HGB URINE DIPSTICK: NEGATIVE
Ketones, ur: NEGATIVE mg/dL
Leukocytes, UA: NEGATIVE
NITRITE: NEGATIVE
PROTEIN: NEGATIVE mg/dL
SPECIFIC GRAVITY, URINE: 1.027 (ref 1.005–1.030)
pH: 6 (ref 5.0–8.0)

## 2017-05-07 LAB — POC URINE PREG, ED: PREG TEST UR: NEGATIVE

## 2017-05-07 NOTE — ED Triage Notes (Signed)
Patient here for pregnancy test, no complaints. Patient reports 1 day late with her menstrual cycle

## 2017-05-07 NOTE — ED Notes (Signed)
Declined W/C at D/C and was escorted to lobby by RN. 

## 2017-05-07 NOTE — ED Provider Notes (Signed)
[facility] EMERGENCY DEPARTMENT Provider Note   CSN: [id number] Arrival date & time: 05/07/17  4098     History   Chief Complaint No chief complaint on file.   HPI Kristin Bruce is a 21 y.o. female presents emergency department with complaint of breast tenderness and one missed period. Patient states that she took a home pregnancy test was unsure of the results because she saw very faint line second to the dark pink line. She is on birth control but states that sometimes she forgets to take her pill or takes it at a different time.she complains of some breast tenderness today.  HPI  Past Medical History:  Diagnosis Date  . Asthma   . Preeclampsia   . Pregnancy induced hypertension     Patient Active Problem List   Diagnosis Date Noted  . Symptomatic anemia 03/28/2016  . Anemia 03/28/2016  . Vaginal delivery 03/17/2016  . Pre-eclampsia, severe 03/17/2016  . Mild preeclampsia 03/16/2016  . Positive GBS test 03/16/2016  . Asthma affecting pregnancy, antepartum 03/16/2016  . Vitamin D deficiency 03/16/2016  . Anemia of pregnancy 01/07/2016  . Adjustment disorder with depressed mood 11/22/2015    Past Surgical History:  Procedure Laterality Date  . NO PAST SURGERIES      OB History    Gravida Para Term Preterm AB Living   1 1 1     1    SAB TAB Ectopic Multiple Live Births         0 1       Home Medications    Prior to Admission medications   Medication Sig Start Date End Date Taking? Authorizing Provider  albuterol (PROVENTIL HFA;VENTOLIN HFA) 108 (90 Base) MCG/ACT inhaler Inhale 1-2 puffs into the lungs every 6 (six) hours as needed for wheezing or shortness of breath.    [provider]  aspirin 325 MG tablet Take 650 mg by mouth every 4 (four) hours as needed (for chest pain).    [provider]  ibuprofen (ADVIL,MOTRIN) 600 MG tablet Take 1 tablet (600 mg total) by mouth every 6 (six) hours as needed. 08/02/16   Mesner,  Barbara Cower, MD  loratadine (CLARITIN) 10 MG tablet Take 1 tablet (10 mg total) by mouth daily as needed for rhinitis. 06/16/16   Trixie Dredge, PA-C  naproxen (NAPROSYN) 500 MG tablet Take 1 tablet (500 mg total) by mouth 2 (two) times daily. 05/19/16   Barrett Henle, PA-C  norethindrone-ethinyl estradiol (JUNEL FE,GILDESS FE,LOESTRIN FE) 1-20 MG-MCG tablet Take 1 tablet by mouth daily.    [provider]  pseudoephedrine (SUDAFED) 60 MG tablet Take 1 tablet (60 mg total) by mouth every 6 (six) hours as needed for congestion. 06/16/16   Trixie Dredge, PA-C    Family History Family History  Problem Relation Age of Onset  . Diabetes Mother   . Diabetes Maternal Grandfather     Social History Social History  Substance Use Topics  . Smoking status: Never Smoker  . Smokeless tobacco: Never Used  . Alcohol use No     Allergies   Patient has no known allergies.   Review of Systems Review of Systems Ten systems reviewed and are negative for acute change, except as noted in the HPI.    Physical Exam Updated Vital Signs BP 121/84   Pulse 90   Temp 98.8 F (37.1 C) (Oral)   Resp 16   LMP 04/07/2017   SpO2 100%   Physical Exam Physical Exam  Nursing note and vitals reviewed. Constitutional: She is oriented to person, place, and time. She appears well-developed and well-nourished. No distress.  HENT:  Head: Normocephalic and atraumatic.  Eyes: Conjunctivae normal and EOM are normal. Pupils are equal, round, and reactive to light. No scleral icterus.  Neck: Normal range of motion.  Cardiovascular: Normal rate, regular rhythm and normal heart sounds.  Exam reveals no gallop and no friction rub.   No murmur heard. Pulmonary/Chest: Effort normal and breath sounds normal. No respiratory distress.  Abdominal: Soft. Bowel sounds are normal. She exhibits no distension and no mass. There is no tenderness. There is no guarding.  Neurological: She is alert and oriented to  person, place, and time.  Skin: Skin is warm and dry. She is not diaphoretic.     ED Treatments / Results  Labs (all labs ordered are listed, but only abnormal results are displayed) Labs Reviewed  URINALYSIS, ROUTINE W REFLEX MICROSCOPIC  POC URINE PREG, ED    EKG  EKG Interpretation None       Radiology No results found.  Procedures Procedures (including critical care time)  Medications Ordered in ED Medications - No data to display   Initial Impression / Assessment and Plan / ED Course  I have reviewed the triage vital signs and the nursing notes.  Pertinent labs & imaging results that were available during my care of the patient were reviewed by me and considered in my medical decision making (see chart for details).     Patient with some mild breast tenderness, she missed one day of her period. Her urine pregnancy test is negative. Discussed follow-up care and pregnancy profession. She denies any vaginal symptoms and appears appropriate for discharge at this time.  Final Clinical Impressions(s) / ED Diagnoses   Final diagnoses:  Missed period    New Prescriptions New Prescriptions   No medications on file     Arthor CaptainHarris, Vihana Kydd, PA-C 05/07/17 1210    Rolan BuccoBelfi, Melanie, MD 05/07/17 1406

## 2017-05-07 NOTE — Discharge Instructions (Signed)
Contact a health care provider if: You develop a fever with your period. Your periods are lasting more than 7 days. Your period is so heavy that you have to change pads or tampons every 30 minutes. You develop clots with your period and never had clots before. You cannot get relief from over-the-counter medication for your symptoms. Your period has not started, and it has been longer than 35 days.

## 2017-05-11 ENCOUNTER — Encounter: Payer: Self-pay | Admitting: General Practice

## 2017-05-11 ENCOUNTER — Ambulatory Visit (INDEPENDENT_AMBULATORY_CARE_PROVIDER_SITE_OTHER): Payer: Medicaid Other | Admitting: General Practice

## 2017-05-11 DIAGNOSIS — Z3201 Encounter for pregnancy test, result positive: Secondary | ICD-10-CM

## 2017-05-11 NOTE — Progress Notes (Signed)
Patient here for upt today. UPT +. Patient reports first positive home test early Saturday morning but went to [facility] that evening and their test was negative. LMP 04/14/17. Patient reports 21 day cycles. EDD 01/19/18 [id number]. Patient denies taking any medications or vitamins. Recommended she begin PNV and initiate prenatal care. Pregnancy verification letter given. Patient had no questions

## 2017-05-13 LAB — POCT PREGNANCY, URINE: Preg Test, Ur: POSITIVE — AB

## 2017-05-13 NOTE — Progress Notes (Signed)
Here for pregnancy confirmation. Do not see +UPT on file here, and -UPT from 05/07/17.  Will message RN for clarification of her note.  Thressa ShellerHeather Thara Searing 10:39 AM 05/13/17

## 2017-05-15 ENCOUNTER — Inpatient Hospital Stay (HOSPITAL_COMMUNITY)
Admission: AD | Admit: 2017-05-15 | Discharge: 2017-05-15 | Disposition: A | Discharge status: Home / Self Care | Payer: Medicaid Other | Source: Ambulatory Visit | Attending: Obstetrics and Gynecology | Admitting: Obstetrics and Gynecology

## 2017-05-15 ENCOUNTER — Encounter (HOSPITAL_COMMUNITY): Payer: Self-pay | Admitting: *Deleted

## 2017-05-15 DIAGNOSIS — O26899 Other specified pregnancy related conditions, unspecified trimester: Secondary | ICD-10-CM | POA: Diagnosis not present

## 2017-05-15 DIAGNOSIS — Z3490 Encounter for supervision of normal pregnancy, unspecified, unspecified trimester: Secondary | ICD-10-CM

## 2017-05-15 DIAGNOSIS — Z7982 Long term (current) use of aspirin: Secondary | ICD-10-CM | POA: Diagnosis not present

## 2017-05-15 DIAGNOSIS — B9689 Other specified bacterial agents as the cause of diseases classified elsewhere: Secondary | ICD-10-CM

## 2017-05-15 DIAGNOSIS — Z3A00 Weeks of gestation of pregnancy not specified: Secondary | ICD-10-CM | POA: Diagnosis not present

## 2017-05-15 DIAGNOSIS — N898 Other specified noninflammatory disorders of vagina: Secondary | ICD-10-CM | POA: Insufficient documentation

## 2017-05-15 DIAGNOSIS — R1030 Lower abdominal pain, unspecified: Secondary | ICD-10-CM | POA: Diagnosis present

## 2017-05-15 DIAGNOSIS — Z79899 Other long term (current) drug therapy: Secondary | ICD-10-CM | POA: Insufficient documentation

## 2017-05-15 DIAGNOSIS — N760 Acute vaginitis: Secondary | ICD-10-CM

## 2017-05-15 LAB — URINALYSIS, ROUTINE W REFLEX MICROSCOPIC
BILIRUBIN URINE: NEGATIVE
Glucose, UA: NEGATIVE mg/dL
HGB URINE DIPSTICK: NEGATIVE
Ketones, ur: NEGATIVE mg/dL
Leukocytes, UA: NEGATIVE
Nitrite: NEGATIVE
PROTEIN: NEGATIVE mg/dL
SPECIFIC GRAVITY, URINE: 1.027 (ref 1.005–1.030)
pH: 6 (ref 5.0–8.0)

## 2017-05-15 LAB — WET PREP, GENITAL
Sperm: NONE SEEN
TRICH WET PREP: NONE SEEN
Yeast Wet Prep HPF POC: NONE SEEN

## 2017-05-15 LAB — HCG, QUANTITATIVE, PREGNANCY: HCG, BETA CHAIN, QUANT, S: 1072 m[IU]/mL — AB (ref <5)

## 2017-05-15 MED ORDER — SULFAMETHOXAZOLE-TRIMETHOPRIM 800-160 MG PO TABS: 1.0000 | ORAL_TABLET | Freq: Two times a day (BID) | ORAL | 0 refills | Status: DC

## 2017-05-15 NOTE — MAU Note (Signed)
Chief Complaint: Vaginal Discharge and Abdominal Pain   None    SUBJECTIVE HPI: Kristin Bruce is a 21 y.o. G2P1001 at Unknown who presents to Maternity Admissions reporting early pregnancy with some cramping and white discharge.  Denies bleeding.  No concerns for STDs.  Has an appt with [facility] for nurse intake next week.  Denies urinary frequency or urgency.  Denies constipation.  Having some nausea, no vomiting unable to eat and drink.  Location: vaginal discharge Quality: small amount white discharge Severity: 4/10 on pain scale Duration: 2 days Modifying factors: Has not tried any otc   Past Medical History:  Diagnosis Date  . Asthma   . Preeclampsia   . Pregnancy induced hypertension    OB History  Gravida Para Term Preterm AB Living  2 1 1     1   SAB TAB Ectopic Multiple Live Births        0 1    # Outcome Date GA Lbr Len/2nd Weight Sex Delivery Anes PTL Lv  2 Current           1 Term 03/17/16 [id number] 00:28 / 03:09 3.24 kg (7 lb 2.3 oz) F Vag-Spont EPI  LIV     Past Surgical History:  Procedure Laterality Date  . NO PAST SURGERIES     Social History   Social History  . Marital status: Single    Spouse name: N/A  . Number of children: N/A  . Years of education: N/A   Occupational History  . Not on file.   Social History Main Topics  . Smoking status: Never Smoker  . Smokeless tobacco: Never Used  . Alcohol use No  . Drug use: No  . Sexual activity: Yes    Birth control/ protection: Abstinence   Other Topics Concern  . Not on file   Social History Narrative  . No narrative on file   Family History  Problem Relation Age of Onset  . Diabetes Mother   . Diabetes Maternal Grandfather    No current facility-administered medications on file prior to encounter.    Current Outpatient Prescriptions on File Prior to Encounter  Medication Sig Dispense Refill  . norethindrone-ethinyl estradiol (JUNEL FE,GILDESS FE,LOESTRIN FE) 1-20 MG-MCG tablet Take 1 tablet  by mouth daily.    Marland Kitchen. albuterol (PROVENTIL HFA;VENTOLIN HFA) 108 (90 Base) MCG/ACT inhaler Inhale 1-2 puffs into the lungs every 6 (six) hours as needed for wheezing or shortness of breath.    Marland Kitchen. aspirin 325 MG tablet Take 650 mg by mouth every 4 (four) hours as needed (for chest pain).    Marland Kitchen. ibuprofen (ADVIL,MOTRIN) 600 MG tablet Take 1 tablet (600 mg total) by mouth every 6 (six) hours as needed. 30 tablet 0  . loratadine (CLARITIN) 10 MG tablet Take 1 tablet (10 mg total) by mouth daily as needed for rhinitis. 20 tablet 0  . naproxen (NAPROSYN) 500 MG tablet Take 1 tablet (500 mg total) by mouth 2 (two) times daily. 30 tablet 0  . pseudoephedrine (SUDAFED) 60 MG tablet Take 1 tablet (60 mg total) by mouth every 6 (six) hours as needed for congestion. 30 tablet 0   No Known Allergies  I have reviewed patient's Past Medical Hx, Surgical Hx, Family Hx, Social Hx, medications and allergies.   Review of Systems  Constitutional: Negative.   HENT: Negative.   Eyes: Negative.   Respiratory: Negative.   Cardiovascular: Negative.   Gastrointestinal: Positive for nausea. Negative for abdominal pain.  Endocrine: Negative.  Genitourinary: Positive for vaginal discharge.  Musculoskeletal: Negative.   Allergic/Immunologic: Negative.   Neurological: Negative.   Hematological: Negative.   Psychiatric/Behavioral: Negative.     OBJECTIVE Patient Vitals for the past 24 hrs:  BP Temp Temp src Pulse Resp Height Weight  05/15/17 1858 116/74 98.2 F (36.8 C) Oral 96 16 5' (1.524 m) 49.4 kg (109 lb)   Constitutional: Well-developed, well-nourished female in no acute distress.  Cardiovascular: normal rate Respiratory: normal rate and effort.  GI: Abd soft, non-tender, gravid appropriate for gestational age.  MS: Extremities nontender, no edema, normal ROM Neurologic: Alert and oriented x 4.  GU: Neg CVAT.  SPECULUM EXAM: NEFG, small amount of white discharge, no blood noted, cervix clean  BIMANUAL:  cervix LTC uterus normal size, no adnexal tenderness or masses.  No CMT.  LAB RESULTS Results for orders placed or performed during the hospital encounter of 05/15/17 (from the past 24 hour(s))  Urinalysis, Routine w reflex microscopic     Status: Abnormal   Collection Time: 05/15/17  7:00 PM  Result Value Ref Range   Color, Urine YELLOW YELLOW   APPearance HAZY (A) CLEAR   Specific Gravity, Urine 1.027 1.005 - 1.030   pH 6.0 5.0 - 8.0   Glucose, UA NEGATIVE NEGATIVE mg/dL   Hgb urine dipstick NEGATIVE NEGATIVE   Bilirubin Urine NEGATIVE NEGATIVE   Ketones, ur NEGATIVE NEGATIVE mg/dL   Protein, ur NEGATIVE NEGATIVE mg/dL   Nitrite NEGATIVE NEGATIVE   Leukocytes, UA NEGATIVE NEGATIVE    IMAGING No results found.  MAU COURSE Orders Placed This Encounter  Procedures  . Wet prep, genital  . Urinalysis, Routine w reflex microscopic  . hCG, quantitative, pregnancy   No orders of the defined types were placed in this encounter.   MDM PE, UA, wet mount ASSESSMENT Vaginal discharge in pregnancy  PLAN Discharge home in stable condition. Bleeding precautions  Medications PNV   Kenney Houseman, CNM 05/15/2017  8:44 PM

## 2017-05-15 NOTE — MAU Note (Signed)
Pt early preg, confirmed in clinic and c/o lower abd cramping and thick, white vag discharge without itching for the past two day. Denies any vag bleeding.

## 2017-05-16 LAB — GC/CHLAMYDIA PROBE AMP (~~LOC~~) NOT AT ARMC
Chlamydia: NEGATIVE
Neisseria Gonorrhea: NEGATIVE

## 2017-05-17 LAB — OB RESULTS CONSOLE ABO/RH: RH TYPE: POSITIVE

## 2017-05-17 LAB — OB RESULTS CONSOLE RUBELLA ANTIBODY, IGM: RUBELLA: IMMUNE

## 2017-05-17 LAB — OB RESULTS CONSOLE HEPATITIS B SURFACE ANTIGEN: Hepatitis B Surface Ag: NEGATIVE

## 2017-05-17 LAB — OB RESULTS CONSOLE GC/CHLAMYDIA
Chlamydia: NEGATIVE
Gonorrhea: NEGATIVE

## 2017-05-17 LAB — OB RESULTS CONSOLE HIV ANTIBODY (ROUTINE TESTING): HIV: NONREACTIVE

## 2017-05-17 LAB — OB RESULTS CONSOLE RPR: RPR: NONREACTIVE

## 2017-05-17 LAB — OB RESULTS CONSOLE ANTIBODY SCREEN: Antibody Screen: NEGATIVE

## 2017-05-19 DIAGNOSIS — Z2839 Other underimmunization status: Secondary | ICD-10-CM | POA: Insufficient documentation

## 2017-05-19 DIAGNOSIS — O99891 Other specified diseases and conditions complicating pregnancy: Secondary | ICD-10-CM | POA: Insufficient documentation

## 2017-05-30 ENCOUNTER — Encounter (HOSPITAL_COMMUNITY): Payer: Self-pay | Admitting: *Deleted

## 2017-05-30 ENCOUNTER — Inpatient Hospital Stay (HOSPITAL_COMMUNITY)
Admission: AD | Admit: 2017-05-30 | Discharge: 2017-05-30 | Disposition: A | Discharge status: Home / Self Care | Payer: Medicaid Other | Source: Ambulatory Visit | Attending: Obstetrics and Gynecology | Admitting: Obstetrics and Gynecology

## 2017-05-30 ENCOUNTER — Inpatient Hospital Stay (HOSPITAL_COMMUNITY): Payer: Medicaid Other

## 2017-05-30 DIAGNOSIS — O26891 Other specified pregnancy related conditions, first trimester: Secondary | ICD-10-CM | POA: Diagnosis not present

## 2017-05-30 DIAGNOSIS — R109 Unspecified abdominal pain: Secondary | ICD-10-CM | POA: Insufficient documentation

## 2017-05-30 DIAGNOSIS — O26899 Other specified pregnancy related conditions, unspecified trimester: Secondary | ICD-10-CM

## 2017-05-30 DIAGNOSIS — Z3A01 Less than 8 weeks gestation of pregnancy: Secondary | ICD-10-CM | POA: Insufficient documentation

## 2017-05-30 LAB — URINALYSIS, ROUTINE W REFLEX MICROSCOPIC
Bilirubin Urine: NEGATIVE
Glucose, UA: NEGATIVE mg/dL
HGB URINE DIPSTICK: NEGATIVE
Ketones, ur: NEGATIVE mg/dL
NITRITE: NEGATIVE
Protein, ur: NEGATIVE mg/dL
SPECIFIC GRAVITY, URINE: 1.025 (ref 1.005–1.030)
pH: 6 (ref 5.0–8.0)

## 2017-05-30 NOTE — MAU Provider Note (Signed)
Chief Complaint: Abdominal Pain; Emesis; and Back Pain   None    SUBJECTIVE HPI: DESTINY HAGIN is a 21 y.o. G2P1001 at [redacted]w[redacted]d who presents to Maternity Admissions reporting abdominal cramping and back pain.  Having some nausea but able to drink fluids.  Denies bleeding.  Just finished prescription of Flagyl for BV.  Location: back and lower abdominal pain. Quality: cramping Severity:7/10 on pain scale Duration: today  Associated signs and symptoms: states has back pain in pregnancy  Past Medical History:  Diagnosis Date  . Asthma   . Preeclampsia   . Pregnancy induced hypertension    OB History  Gravida Para Term Preterm AB Living  2 1 1     1   SAB TAB Ectopic Multiple Live Births        0 1    # Outcome Date GA Lbr Len/2nd Weight Sex Delivery Anes PTL Lv  2 Current           1 Term 03/17/16 [redacted]w[redacted]d 00:28 / 03:09 3.24 kg (7 lb 2.3 oz) F Vag-Spont EPI  LIV     Past Surgical History:  Procedure Laterality Date  . NO PAST SURGERIES     Social History   Socioeconomic History  . Marital status: Single    Spouse name: Not on file  . Number of children: Not on file  . Years of education: Not on file  . Highest education level: Not on file  Social Needs  . Financial resource strain: Not on file  . Food insecurity - worry: Not on file  . Food insecurity - inability: Not on file  . Transportation needs - medical: Not on file  . Transportation needs - non-medical: Not on file  Occupational History  . Not on file  Tobacco Use  . Smoking status: Never Smoker  . Smokeless tobacco: Never Used  Substance and Sexual Activity  . Alcohol use: No    Comment: not since pregnancy  . Drug use: No  . Sexual activity: Yes  Other Topics Concern  . Not on file  Social History Narrative  . Not on file   Family History  Problem Relation Age of Onset  . Diabetes Mother   . Diabetes Maternal Grandfather    No current facility-administered medications on file prior to encounter.     Current Outpatient Medications on File Prior to Encounter  Medication Sig Dispense Refill  . albuterol (PROVENTIL HFA;VENTOLIN HFA) 108 (90 Base) MCG/ACT inhaler Inhale 1-2 puffs into the lungs every 6 (six) hours as needed for wheezing or shortness of breath.    . loratadine (CLARITIN) 10 MG tablet Take 1 tablet (10 mg total) by mouth daily as needed for rhinitis. 20 tablet 0  . pseudoephedrine (SUDAFED) 60 MG tablet Take 1 tablet (60 mg total) by mouth every 6 (six) hours as needed for congestion. 30 tablet 0  . sulfamethoxazole-trimethoprim (BACTRIM DS,SEPTRA DS) 800-160 MG tablet Take 1 tablet by mouth 2 (two) times daily. 14 tablet 0   No Known Allergies  I have reviewed patient's Past Medical Hx, Surgical Hx, Family Hx, Social Hx, medications and allergies.   Review of Systems  Constitutional: Negative.   HENT: Negative.   Eyes: Negative.   Respiratory: Negative.   Cardiovascular: Negative.   Gastrointestinal: Positive for abdominal pain and nausea.  Endocrine: Negative.   Genitourinary: Negative.   Musculoskeletal: Negative.   Skin: Negative.   Allergic/Immunologic: Negative.   Neurological: Negative.   Hematological: Negative.   Psychiatric/Behavioral: Negative.  OBJECTIVE Patient Vitals for the past 24 hrs:  BP Temp Temp src Pulse Resp SpO2 Weight  05/30/17 2020 107/66 98.3 F (36.8 C) Oral 89 16 - -  05/30/17 1344 123/81 98.4 F (36.9 C) Oral 83 16 100 % 50.4 kg (111 lb 1.9 oz)   Constitutional: Well-developed, well-nourished female in no acute distress.  Cardiovascular: normal rate Respiratory: normal rate and effort.  GI: Abd soft, non-tender, gravid appropriate for gestational age. Pos BS x 4 MS: Extremities nontender, no edema, normal ROM Neurologic: Alert and oriented x 4.  GU: Defered LAB RESULTS Results for orders placed or performed during the hospital encounter of 05/30/17 (from the past 24 hour(s))  Urinalysis, Routine w reflex microscopic      Status: Abnormal   Collection Time: 05/30/17  1:37 PM  Result Value Ref Range   Color, Urine YELLOW YELLOW   APPearance HAZY (A) CLEAR   Specific Gravity, Urine 1.025 1.005 - 1.030   pH 6.0 5.0 - 8.0   Glucose, UA NEGATIVE NEGATIVE mg/dL   Hgb urine dipstick NEGATIVE NEGATIVE   Bilirubin Urine NEGATIVE NEGATIVE   Ketones, ur NEGATIVE NEGATIVE mg/dL   Protein, ur NEGATIVE NEGATIVE mg/dL   Nitrite NEGATIVE NEGATIVE   Leukocytes, UA TRACE (A) NEGATIVE   RBC / HPF 0-5 0 - 5 RBC/hpf   WBC, UA 0-5 0 - 5 WBC/hpf   Bacteria, UA RARE (A) NONE SEEN   Squamous Epithelial / LPF 6-30 (A) NONE SEEN   Mucus PRESENT     IMAGING [place] Ob Comp Less 14 Wks  Result Date: 05/30/2017 CLINICAL DATA:  Abdominal pain pregnancy. Patient is 6 weeks and 3 days pregnant based on her last menstrual period. EXAM: OBSTETRIC <14 WK US AND TRANSVAGINAL OB US TECHNIQUE: Both transabdominal and transvaginal ultrasound examinations were performed for complete evaluation of the gestation as well as the maternal uterus, adnexal regions, and pelvic cul-de-sac. Transvaginal technique was performed to assess early pregnancy. COMPARISON:  None. FINDINGS: Intrauterine gestational sac: Single Yolk sac:  Visualized. Embryo:  Visualized. Cardiac Activity: Visualized. Heart Rate: 121  bpm CRL:  6.5  mm   6 w   3 d                  US EDC: 01/20/2018 Subchorionic hemorrhage:  Small subchronic hemorrhage. Maternal uterus/adnexae: No uterine masses. Cervix is closed. Normal left ovary. 2 cm hypoechoic area from the upper margin of the right ovary, likely a corpus luteum. Right ovary otherwise unremarkable. No adnexal masses. No pelvic free fluid. IMPRESSION: 1. Single live intrauterine pregnancy with a measured gestational age of [redacted] weeks and 3 days, concordant with the expected gestational age based on the last menstral period. 2. Small subchorionic hemorrhage.  No other pregnancy complication. Electronically Signed   By: Amie Portlandavid  Ormond M.D.    On: 05/30/2017 18:59   [place] Ob Transvaginal  Result Date: 05/30/2017 CLINICAL DATA:  Abdominal pain pregnancy. Patient is 6 weeks and 3 days pregnant based on her last menstrual period. EXAM: OBSTETRIC <14 WK US AND TRANSVAGINAL OB US TECHNIQUE: Both transabdominal and transvaginal ultrasound examinations were performed for complete evaluation of the gestation as well as the maternal uterus, adnexal regions, and pelvic cul-de-sac. Transvaginal technique was performed to assess early pregnancy. COMPARISON:  None. FINDINGS: Intrauterine gestational sac: Single Yolk sac:  Visualized. Embryo:  Visualized. Cardiac Activity: Visualized. Heart Rate: 121  bpm CRL:  6.5  mm   6 w   3 d  US EDC: 01/20/2018 Subchorionic hemorrhage:  Small subchronic hemorrhage. Maternal uterus/adnexae: No uterine masses. Cervix is closed. Normal left ovary. 2 cm hypoechoic area from the upper margin of the right ovary, likely a corpus luteum. Right ovary otherwise unremarkable. No adnexal masses. No pelvic free fluid. IMPRESSION: 1. Single live intrauterine pregnancy with a measured gestational age of [redacted] weeks and 3 days, concordant with the expected gestational age based on the last menstral period. 2. Small subchorionic hemorrhage.  No other pregnancy complication. Electronically Signed   By: Amie Portlandavid  Ormond M.D.   On: 05/30/2017 18:59    MAU COURSE Orders Placed This Encounter  Procedures  . US OB Transvaginal  . US OB Comp Less 14 Wks  . Urinalysis, Routine w reflex microscopic  . Discharge instructions  . Discharge patient Discharge disposition: 01-Home or Self Care; Discharge patient date: 05/30/2017   No orders of the defined types were placed in this encounter.   MDM PE and labs US ASSESSMENT 1. Abdominal pain in pregnancy, first trimester   2. Abdominal pain in pregnancy, antepartum     PLAN Discussed taking vit B 6 BID and 1/2 tab ofunasom at night for nausea.n  Push fluids, Tylenol for  discomfort.  Follow up at office Discharge home in stable condition. Bleeding precautions given. Allergies as of 05/30/2017   No Known Allergies     Medication List    STOP taking these medications   sulfamethoxazole-trimethoprim 800-160 MG tablet Commonly known as:  BACTRIM DS,SEPTRA DS     TAKE these medications   albuterol 108 (90 Base) MCG/ACT inhaler Commonly known as:  PROVENTIL HFA;VENTOLIN HFA Inhale 1-2 puffs into the lungs every 6 (six) hours as needed for wheezing or shortness of breath.   loratadine 10 MG tablet Commonly known as:  CLARITIN Take 1 tablet (10 mg total) by mouth daily as needed for rhinitis.   pseudoephedrine 60 MG tablet Commonly known as:  SUDAFED Take 1 tablet (60 mg total) by mouth every 6 (six) hours as needed for congestion.        Kenney Housemanrothero, Nancy Jean, CNM 05/30/2017  8:37 PM

## 2017-05-30 NOTE — MAU Note (Signed)
+  lower back and abdominal pain Rating pain 7/10 Tried ibuprofen with no relief Cramping in nature Intermittent  +N/V Reports emesis 4-5 times in past 24 hours Endorses being unable to eat or drink  Next OB appointment 11/28 @[facility]   Denies vaginal bleeding or discharge. States just finished antibiotic for BV but doesn't feel like it helped much.

## 2017-05-30 NOTE — Discharge Instructions (Signed)
Take Tylenol for discomfort Take vitamin B 6 50mg  Twice per day and 1/2 tablet unasom at night for nausea Push fluids

## 2017-06-09 DIAGNOSIS — B009 Herpesviral infection, unspecified: Secondary | ICD-10-CM | POA: Insufficient documentation

## 2017-07-13 ENCOUNTER — Encounter (HOSPITAL_COMMUNITY): Payer: Self-pay

## 2017-07-13 ENCOUNTER — Inpatient Hospital Stay (HOSPITAL_COMMUNITY)
Admission: AD | Admit: 2017-07-13 | Discharge: 2017-07-13 | Disposition: A | Discharge status: Home / Self Care | Payer: Medicaid Other | Source: Ambulatory Visit | Attending: Obstetrics & Gynecology | Admitting: Obstetrics & Gynecology

## 2017-07-13 DIAGNOSIS — Z3A12 12 weeks gestation of pregnancy: Secondary | ICD-10-CM | POA: Diagnosis not present

## 2017-07-13 DIAGNOSIS — O26891 Other specified pregnancy related conditions, first trimester: Secondary | ICD-10-CM | POA: Diagnosis not present

## 2017-07-13 DIAGNOSIS — O219 Vomiting of pregnancy, unspecified: Secondary | ICD-10-CM | POA: Insufficient documentation

## 2017-07-13 DIAGNOSIS — R109 Unspecified abdominal pain: Secondary | ICD-10-CM | POA: Diagnosis present

## 2017-07-13 DIAGNOSIS — M549 Dorsalgia, unspecified: Secondary | ICD-10-CM | POA: Insufficient documentation

## 2017-07-13 DIAGNOSIS — Z833 Family history of diabetes mellitus: Secondary | ICD-10-CM | POA: Insufficient documentation

## 2017-07-13 DIAGNOSIS — A084 Viral intestinal infection, unspecified: Secondary | ICD-10-CM

## 2017-07-13 LAB — COMPREHENSIVE METABOLIC PANEL
ALBUMIN: 3.6 g/dL (ref 3.5–5.0)
ALT: 12 U/L — AB (ref 14–54)
AST: 16 U/L (ref 15–41)
Alkaline Phosphatase: 52 U/L (ref 38–126)
Anion gap: 11 (ref 5–15)
BILIRUBIN TOTAL: 2 mg/dL — AB (ref 0.3–1.2)
BUN: 16 mg/dL (ref 6–20)
CO2: 18 mmol/L — ABNORMAL LOW (ref 22–32)
CREATININE: 0.47 mg/dL (ref 0.44–1.00)
Calcium: 8.7 mg/dL — ABNORMAL LOW (ref 8.9–10.3)
Chloride: 105 mmol/L (ref 101–111)
GFR calc Af Amer: >60 mL/min (ref >60)
GLUCOSE: 69 mg/dL (ref 65–99)
POTASSIUM: 3.5 mmol/L (ref 3.5–5.1)
Sodium: 134 mmol/L — ABNORMAL LOW (ref 135–145)
Total Protein: 7.3 g/dL (ref 6.5–8.1)

## 2017-07-13 LAB — URINALYSIS, ROUTINE W REFLEX MICROSCOPIC
Bilirubin Urine: NEGATIVE
Glucose, UA: NEGATIVE mg/dL
HGB URINE DIPSTICK: NEGATIVE
Ketones, ur: 80 mg/dL — AB
Leukocytes, UA: NEGATIVE
Nitrite: NEGATIVE
PROTEIN: 100 mg/dL — AB
SPECIFIC GRAVITY, URINE: 1.029 (ref 1.005–1.030)
pH: 5 (ref 5.0–8.0)

## 2017-07-13 LAB — INFLUENZA PANEL BY PCR (TYPE A & B)
Influenza A By PCR: NEGATIVE
Influenza B By PCR: NEGATIVE

## 2017-07-13 LAB — CBC
HCT: 32.5 % — ABNORMAL LOW (ref 36.0–46.0)
Hemoglobin: 10.6 g/dL — ABNORMAL LOW (ref 12.0–15.0)
MCH: 28.1 pg (ref 26.0–34.0)
MCHC: 32.6 g/dL (ref 30.0–36.0)
MCV: 86.2 fL (ref 78.0–100.0)
PLATELETS: 316 10*3/uL (ref 150–400)
RBC: 3.77 MIL/uL — AB (ref 3.87–5.11)
RDW: 14.8 % (ref 11.5–15.5)
WBC: 9 10*3/uL (ref 4.0–10.5)

## 2017-07-13 MED ORDER — LACTATED RINGERS IV BOLUS (SEPSIS)
1000.0000 mL | Freq: Once | INTRAVENOUS | Status: AC
  Administered 2017-07-13: 1000 mL via INTRAVENOUS

## 2017-07-13 MED ORDER — METOCLOPRAMIDE HCL 5 MG/ML IJ SOLN
10.0000 mg | Freq: Four times a day (QID) | INTRAMUSCULAR | Status: DC
  Administered 2017-07-13: 10 mg via INTRAVENOUS
  Filled 2017-07-13: qty 2

## 2017-07-13 MED ORDER — METOCLOPRAMIDE HCL 10 MG PO TABS: 10.0000 mg | ORAL_TABLET | Freq: Four times a day (QID) | ORAL | 0 refills | Status: DC

## 2017-07-13 MED ORDER — ACETAMINOPHEN 500 MG PO TABS
1000.0000 mg | ORAL_TABLET | Freq: Once | ORAL | Status: AC
Start: 2017-07-13 — End: 2017-07-13
  Administered 2017-07-13: 1000 mg via ORAL
  Filled 2017-07-13: qty 2

## 2017-07-13 MED ORDER — LACTATED RINGERS IV SOLN
INTRAVENOUS | Status: DC
  Administered 2017-07-13 (×2): via INTRAVENOUS

## 2017-07-13 NOTE — MAU Provider Note (Addendum)
History   21 y/o G2P1001 at 12 weeks 6 days EGA  here at MAU with chief complaints of not feeling well for about 24 hours.  She reports nausea and vomiting as well as back aches and muscle aches.  She has mild abdominal pain and diarrhea (brown stools).  She denies vaginal bleeding or leakage of fluid. She reports that father of the baby has not been feeling well either, with similar symptoms of nausea, vomiting and diarrhea.   LMP 04/14/17.  EDC 01/19/18.   Patient Active Problem List   Diagnosis Date Noted  . Symptomatic anemia 03/28/2016  . Anemia 03/28/2016  . Vaginal delivery 03/17/2016  . Pre-eclampsia, severe 03/17/2016  . Mild preeclampsia 03/16/2016  . Positive GBS test 03/16/2016  . Asthma affecting pregnancy, antepartum 03/16/2016  . Vitamin D deficiency 03/16/2016  . Anemia of pregnancy 01/07/2016  . Adjustment disorder with depressed mood 11/22/2015    Chief Complaint  Patient presents with  . Emesis  . Diarrhea  . Back Pain  . Chest Pain   HPI  OB History    Gravida Para Term Preterm AB Living   2 1 1     1    SAB TAB Ectopic Multiple Live Births         0 1      Past Medical History:  Diagnosis Date  . Asthma   . Preeclampsia   . Pregnancy induced hypertension     Past Surgical History:  Procedure Laterality Date  . NO PAST SURGERIES      Family History  Problem Relation Age of Onset  . Diabetes Mother   . Diabetes Maternal Grandfather     Social History   Tobacco Use  . Smoking status: Never Smoker  . Smokeless tobacco: Never Used  Substance Use Topics  . Alcohol use: No    Comment: not since pregnancy  . Drug use: No    Allergies: No Known Allergies  Medications Prior to Admission  Medication Sig Dispense Refill Last Dose  . albuterol (PROVENTIL HFA;VENTOLIN HFA) 108 (90 Base) MCG/ACT inhaler Inhale 1-2 puffs into the lungs every 6 (six) hours as needed for wheezing or shortness of breath.   More than a month at Unknown time  .  loratadine (CLARITIN) 10 MG tablet Take 1 tablet (10 mg total) by mouth daily as needed for rhinitis. 20 tablet 0 More than a month at Unknown time  . pseudoephedrine (SUDAFED) 60 MG tablet Take 1 tablet (60 mg total) by mouth every 6 (six) hours as needed for congestion. 30 tablet 0 More than a month at Unknown time    ROS   No TIA's or unusual headaches, no dysphagia.  No prolonged cough. No dyspnea or chest pain on exertion.  No abdominal pain, change in bowel habits, black or bloody stools.  No urinary tract symptoms.  No new or unusual musculoskeletal symptoms.  Normal menses, no abnormal vaginal bleeding, discharge or unexpected pelvic pain. No new breast lumps, breast pain or nipple discharge.  Physical Exam   Blood pressure 105/70, pulse (!) 129, temperature 99.6 F (37.6 C), temperature source Oral, resp. rate 18, height 5' (1.524 m), weight 49.9 kg (110 lb), last menstrual period 04/15/2017, SpO2 100 %, unknown if currently breastfeeding.  Vitals:   07/13/17 1523 07/13/17 2006 07/13/17 2145  BP: 105/70 104/73 108/64  Pulse: (!) 129 (!) 110 (!) 106  Resp: 18    Temp: 99.6 F (37.6 C) (!) 101.2 F (38.4 C) 99.4  F (37.4 C)  TempSrc: Oral  Oral  SpO2: 100%    Weight: 49.9 kg (110 lb)    Height: 5' (1.524 m)     Physical Exam Constitutional: She is oriented to person, place, and time. She appears well-developed, tired looking. HENT:  Head: Normocephalic and atraumatic.  Neck: Normal range of motion.  GI: Soft. Bowel sounds are normal. Non tender.  Neurological: She is alert and oriented to person, place, and time.  Skin: Skin is warm and dry.  Psychiatric: She has a normal mood and affect. Her behavior is normal.   Prenatal labs significant for:  Blood group O pos GC/chlam Neg/neg (05/18/17) Varicella non Immune  LABS:  CBC    Component Value Date/Time   WBC 9.0 07/13/2017 1622   RBC 3.77 (L) 07/13/2017 1622   HGB 10.6 (L) 07/13/2017 1622   HCT 32.5 (L)  07/13/2017 1622   PLT 316 07/13/2017 1622   MCV 86.2 07/13/2017 1622   MCH 28.1 07/13/2017 1622   MCHC 32.6 07/13/2017 1622   RDW 14.8 07/13/2017 1622   CMP     Component Value Date/Time   NA 134 (L) 07/13/2017 1622   K 3.5 07/13/2017 1622   CL 105 07/13/2017 1622   CO2 18 (L) 07/13/2017 1622   GLUCOSE 69 07/13/2017 1622   BUN 16 07/13/2017 1622   CREATININE 0.47 07/13/2017 1622   CALCIUM 8.7 (L) 07/13/2017 1622   PROT 7.3 07/13/2017 1622   ALBUMIN 3.6 07/13/2017 1622   AST 16 07/13/2017 1622   ALT 12 (L) 07/13/2017 1622   ALKPHOS 52 07/13/2017 1622   BILITOT 2.0 (H) 07/13/2017 1622   GFRNONAA >60 07/13/2017 1622   GFRAA >60 07/13/2017 1622   Urinalysis    Component Value Date/Time   COLORURINE YELLOW 05/30/2017 1337   APPEARANCEUR HAZY (A) 05/30/2017 1337   LABSPEC 1.025 05/30/2017 1337   PHURINE 6.0 05/30/2017 1337   GLUCOSEU NEGATIVE 05/30/2017 1337   HGBUR NEGATIVE 05/30/2017 1337   BILIRUBINUR NEGATIVE 05/30/2017 1337   KETONESUR NEGATIVE 05/30/2017 1337   PROTEINUR NEGATIVE 05/30/2017 1337   UROBILINOGEN 0.2 08/12/2013 1640   NITRITE NEGATIVE 05/30/2017 1337   LEUKOCYTESUR TRACE (A) 05/30/2017 1337   Urinalysis    Component Value Date/Time   COLORURINE YELLOW 07/13/2017 1745   APPEARANCEUR HAZY (A) 07/13/2017 1745   LABSPEC 1.029 07/13/2017 1745   PHURINE 5.0 07/13/2017 1745   GLUCOSEU NEGATIVE 07/13/2017 1745   HGBUR NEGATIVE 07/13/2017 1745   BILIRUBINUR NEGATIVE 07/13/2017 1745   KETONESUR 80 (A) 07/13/2017 1745   PROTEINUR 100 (A) 07/13/2017 1745   UROBILINOGEN 0.2 08/12/2013 1640   NITRITE NEGATIVE 07/13/2017 1745   LEUKOCYTESUR NEGATIVE 07/13/2017 1745      Influenza panel by PCR (type A & B)  Order: [id number]  Status:  Final result Visible to patient:  No (Not Released) Next appt:  None   Ref Range & Units 16:22  Influenza A By PCR NEGATIVE NEGATIVE   Influenza B By PCR NEGATIVE NEGATIVE   Comment: (NOTE)  The Xpert Xpress Flu assay  is intended as an aid in the diagnosis of  influenza and should not be used as a sole basis for treatment. This  assay is FDA approved for nasopharyngeal swab specimens only. Nasal  washings and aspirates are unacceptable for Xpert Xpress Flu testing.   Resulting Agency  [facility]      Specimen Collected: 07/13/17 16:22 Last Resulted: 07/13/17 17:11  ED Course Patient was placed on isolation.  She received IV hydration and lab tests were done.  She received antiemetics and felt better.  She did have a fever while in MAU, she received tylenol and her temperature normalized.    Assessment:  21 y/o G2P1 with likely viral gastroenteritis syndrome  Plan: Discharge to home, advised increased hydration, rest, avoid contacts, symptoms precautions reviewed.  Reglan use prn.  -f/u in office as scheduled next week.  -tylenol use for aches, fever prn.  Infection precautions reviewed.   Konrad FelixKULWA,Talan Gildner WAKURU MD.  07/13/2017 3:47 PM

## 2017-07-13 NOTE — MAU Note (Addendum)
Pt sent from MD office, has been vomiting since 2000 last night, stopped vomiting around 0600 this morning.  Has been drinking water today, but hasn't tried to eat anything.  Started having diarrhea this morning, 2 stools.  Also C/O back, rib & chest pain.  Denies bleeding.

## 2017-07-13 NOTE — Discharge Instructions (Signed)
Morning Sickness Morning sickness is when you feel sick to your stomach (nauseous) during pregnancy. You may feel sick to your stomach and throw up (vomit). You may feel sick in the morning, but you can feel this way any time of day. Some women feel very sick to their stomach and cannot stop throwing up (hyperemesis gravidarum). Follow these instructions at home:  Only take medicines as told by your doctor.  Take multivitamins as told by your doctor. Taking multivitamins before getting pregnant can stop or lessen the harshness of morning sickness.  Eat dry toast or unsalted crackers before getting out of bed.  Eat 5 to 6 small meals a day.  Eat dry and bland foods like rice and baked potatoes.  Do not drink liquids with meals. Drink between meals.  Do not eat greasy, fatty, or spicy foods.  Have someone cook for you if the smell of food causes you to feel sick or throw up.  If you feel sick to your stomach after taking prenatal vitamins, take them at night or with a snack.  Eat protein when you need a snack (nuts, yogurt, cheese).  Eat unsweetened gelatins for dessert.  Wear a bracelet used for sea sickness (acupressure wristband).  Go to a doctor that puts thin needles into certain body points (acupuncture) to improve how you feel.  Do not smoke.  Use a humidifier to keep the air in your house free of odors.  Get lots of fresh air. Contact a doctor if:  You need medicine to feel better.  You feel dizzy or lightheaded.  You are losing weight. Get help right away if:  You feel very sick to your stomach and cannot stop throwing up.  You pass out (faint). This information is not intended to replace advice given to you by your health care provider. Make sure you discuss any questions you have with your health care provider. Document Released: 08/12/2004 Document Revised: 12/11/2015 Document Reviewed: 12/20/2012 Elsevier Interactive Patient Education  2017 [employer].   Food Choices to Help Relieve Diarrhea, Adult When you have diarrhea, the foods you eat and your eating habits are very important. Choosing the right foods and drinks can help:  Relieve diarrhea.  Replace lost fluids and nutrients.  Prevent dehydration.  What general guidelines should I follow? Relieving diarrhea  Choose foods with less than 2 g or .07 oz. of fiber per serving.  Limit fats to less than 8 tsp (38 g or 1.34 oz.) a day.  Avoid the following: ? Foods and beverages sweetened with high-fructose corn syrup, honey, or sugar alcohols such as xylitol, sorbitol, and mannitol. ? Foods that contain a lot of fat or sugar. ? Fried, greasy, or spicy foods. ? High-fiber grains, breads, and cereals. ? Raw fruits and vegetables.  Eat foods that are rich in probiotics. These foods include dairy products such as yogurt and fermented milk products. They help increase healthy bacteria in the stomach and intestines (gastrointestinal tract, or GI tract).  If you have lactose intolerance, avoid dairy products. These may make your diarrhea worse.  Take medicine to help stop diarrhea (antidiarrheal medicine) only as told by your health care provider. Replacing nutrients  Eat small meals or snacks every 3-4 hours.  Eat bland foods, such as white rice, toast, or baked potato, until your diarrhea starts to get better. Gradually reintroduce nutrient-rich foods as tolerated or as told by your health care provider. This includes: ? Well-cooked protein foods. ? Peeled, seeded, and  soft-cooked fruits and vegetables. ? Low-fat dairy products.  Take vitamin and mineral supplements as told by your health care provider. Preventing dehydration   Start by sipping water or a special solution to prevent dehydration (oral rehydration solution, ORS). Urine that is clear or pale yellow means that you are getting enough fluid.  Try to drink at least 8-10 cups of fluid each day to help replace  lost fluids.  You may add other liquids in addition to water, such as clear juice or decaffeinated sports drinks, as tolerated or as told by your health care provider.  Avoid drinks with caffeine, such as coffee, tea, or soft drinks.  Avoid alcohol. What foods are recommended? The items listed may not be a complete list. Talk with your health care provider about what dietary choices are best for you. Grains White rice. White, JamaicaFrench, or pita breads (fresh or toasted), including plain rolls, buns, or bagels. White pasta. Saltine, soda, or graham crackers. Pretzels. Low-fiber cereal. Cooked cereals made with water (such as cornmeal, farina, or cream cereals). Plain muffins. Matzo. Melba toast. Zwieback. Vegetables Potatoes (without the skin). Most well-cooked and canned vegetables without skins or seeds. Tender lettuce. Fruits Apple sauce. Fruits canned in juice. Cooked apricots, cherries, grapefruit, peaches, pears, or plums. Fresh bananas and cantaloupe. Meats and other protein foods Baked or boiled chicken. Eggs. Tofu. Fish. Seafood. Smooth nut butters. Ground or well-cooked tender beef, ham, veal, lamb, pork, or poultry. Dairy Plain yogurt, kefir, and unsweetened liquid yogurt. Lactose-free milk, buttermilk, skim milk, or soy milk. Low-fat or nonfat hard cheese. Beverages Water. Low-calorie sports drinks. Fruit juices without pulp. Strained tomato and vegetable juices. Decaffeinated teas. Sugar-free beverages not sweetened with sugar alcohols. Oral rehydration solutions, if approved by your health care provider. Seasoning and other foods Bouillon, broth, or soups made from recommended foods. What foods are not recommended? The items listed may not be a complete list. Talk with your health care provider about what dietary choices are best for you. Grains Whole grain, whole wheat, bran, or rye breads, rolls, pastas, and crackers. Wild or brown rice. Whole grain or bran cereals. Barley. Oats  and oatmeal. Corn tortillas or taco shells. Granola. Popcorn. Vegetables Raw vegetables. Fried vegetables. Cabbage, broccoli, Brussels sprouts, artichokes, baked beans, beet greens, corn, kale, legumes, peas, sweet potatoes, and yams. Potato skins. Cooked spinach and cabbage. Fruits Dried fruit, including raisins and dates. Raw fruits. Stewed or dried prunes. Canned fruits with syrup. Meat and other protein foods Fried or fatty meats. Deli meats. Chunky nut butters. Nuts and seeds. Beans and lentils. Tomasa BlaseBacon. Hot dogs. Sausage. Dairy High-fat cheeses. Whole milk, chocolate milk, and beverages made with milk, such as milk shakes. Half-and-half. Cream. sour cream. Ice cream. Beverages Caffeinated beverages (such as coffee, tea, soda, or energy drinks). Alcoholic beverages. Fruit juices with pulp. Prune juice. Soft drinks sweetened with high-fructose corn syrup or sugar alcohols. High-calorie sports drinks. Fats and oils Butter. Cream sauces. Margarine. Salad oils. Plain salad dressings. Olives. Avocados. Mayonnaise. Sweets and desserts Sweet rolls, doughnuts, and sweet breads. Sugar-free desserts sweetened with sugar alcohols such as xylitol and sorbitol. Seasoning and other foods Honey. Hot sauce. Chili powder. Gravy. Cream-based or milk-based soups. Pancakes and waffles. Summary  When you have diarrhea, the foods you eat and your eating habits are very important.  Make sure you get at least 8-10 cups of fluid each day, or enough to keep your urine clear or pale yellow.  Eat bland foods and gradually reintroduce healthy,  nutrient-rich foods as tolerated, or as told by your health care provider.  Avoid high-fiber, fried, greasy, or spicy foods. This information is not intended to replace advice given to you by your health care provider. Make sure you discuss any questions you have with your health care provider. Document Released: 09/25/2003 Document Revised: 07/02/2016 Document Reviewed:  07/02/2016 Elsevier Interactive Patient Education  [employer]2018 Elsevier Inc.

## 2017-07-19 NOTE — L&D Delivery Note (Signed)
Delivery Note At 2017 a viable female was delivered via spontaneous vaginal delivery (Presentation: OA).  APGAR: 8,9; weight pending.   Placenta status: delivered spontaneously and completely. Cord: three vessels with the following complications: tight nuchal, somersault maneuver. There was moderate meconium with AROM and fetus had recurrent late decels prior to delivery so NICU staff was on hand for delivery.   Anesthesia:  Epidural Episiotomy:  None Lacerations:  None Suture Repair: n/a Est. Blood Loss (mL):  50  Mom to postpartum.  Baby to Couplet care / Skin to Skin.  Janeece RiggersEllis K Rheagan Nayak 12/29/2017, 8:42 PM

## 2017-08-17 IMAGING — US US OB TRANSVAGINAL
1 series · 15 of 28 positions shown · non-contrast
Comparison: Ob ultrasound 07/31/2015 and 08/08/2015.

CLINICAL DATA: Threatened abortion. History of abdominal cramping
with no pregnancy visualized on prior ultrasound. Rising
quantitative HCG. Subsequent encounter.

EXAM:
TRANSVAGINAL OB ULTRASOUND
TECHNIQUE: Transvaginal ultrasound was performed for complete evaluation of the
gestation as well as the maternal uterus, adnexal regions, and
pelvic cul-de-sac.

[Series 1: us ob transvaginal · 30 acquisitions, 15 frames shown]
[im 1/30]
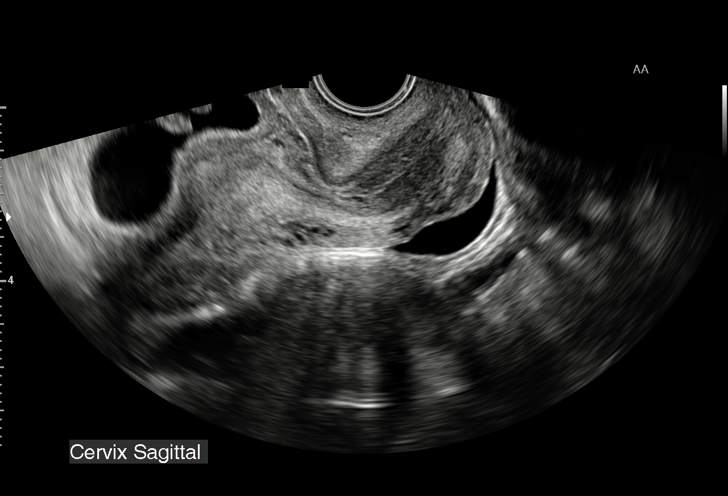
[im 3/30]
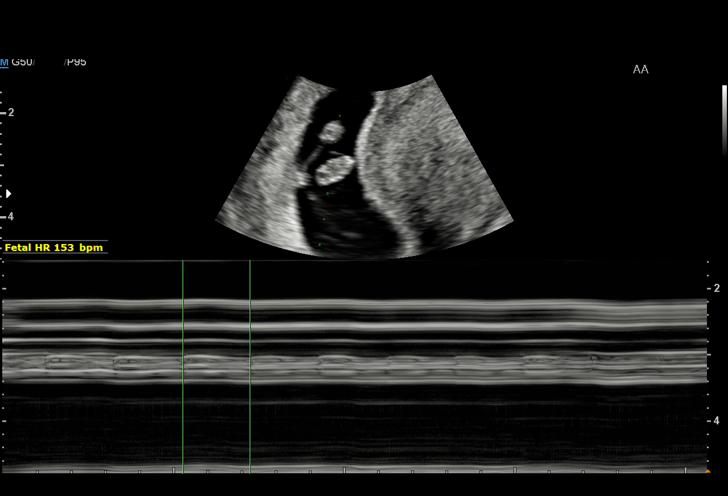
[im 5/30]
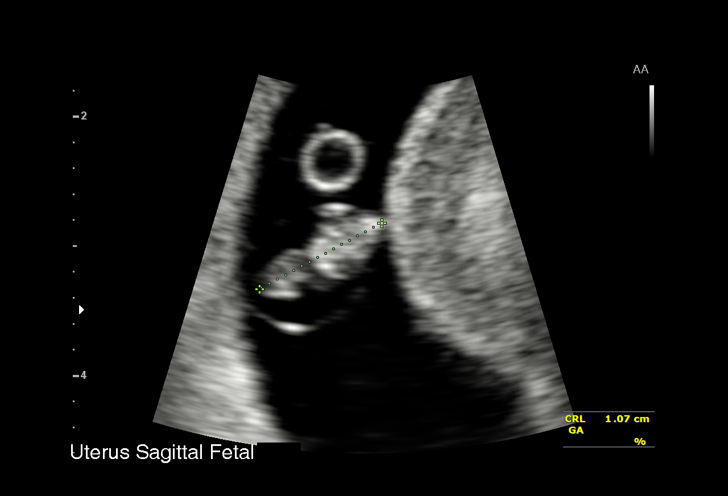
[im 7/30]
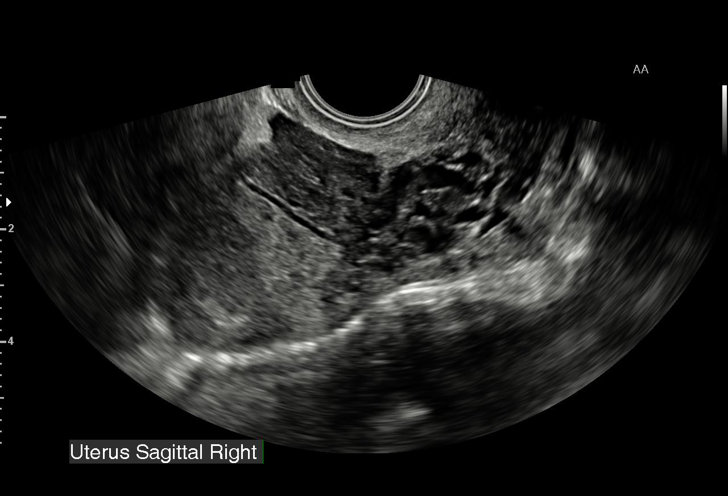
[im 9/30]
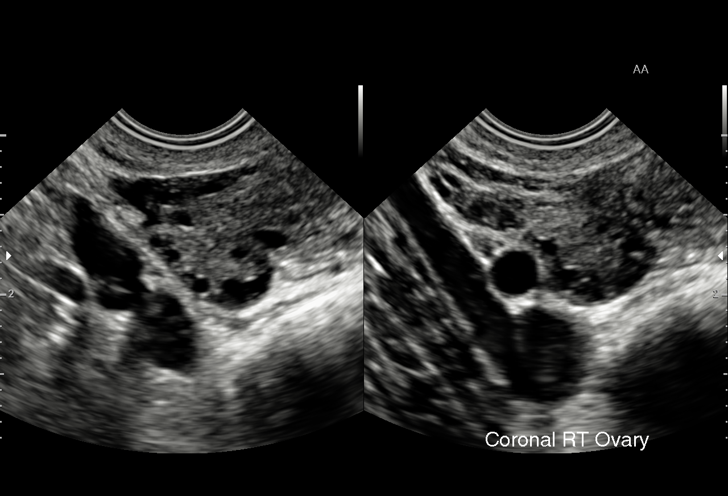
[im 11/30]
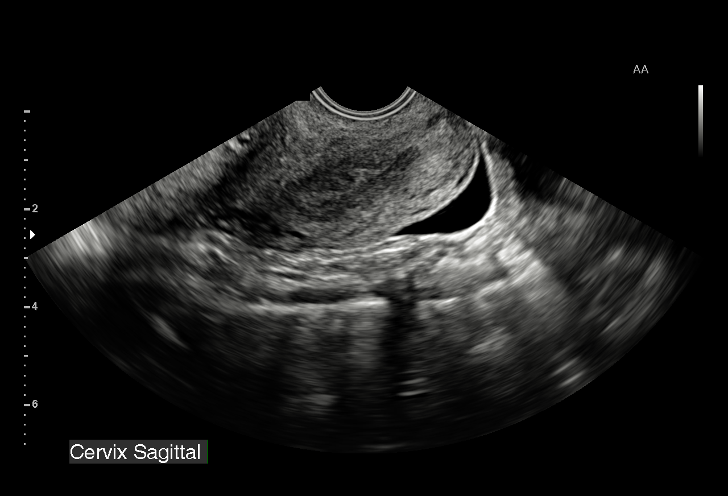
[im 13/30]
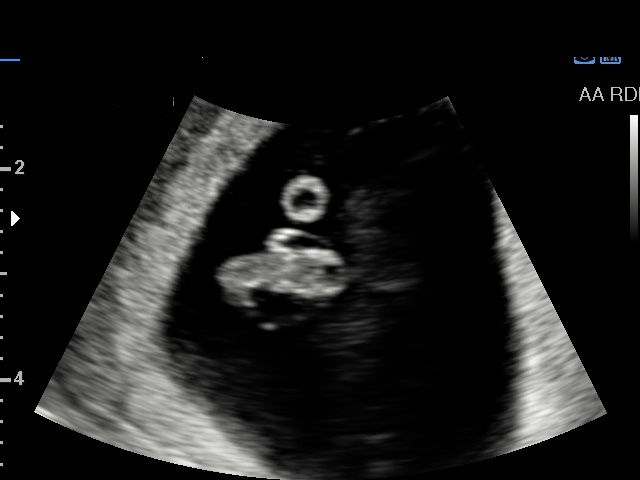
[im 16/30]
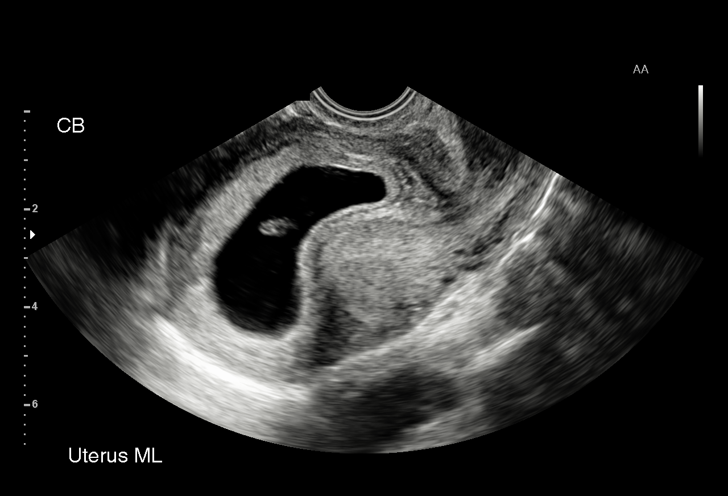
[im 17/30]
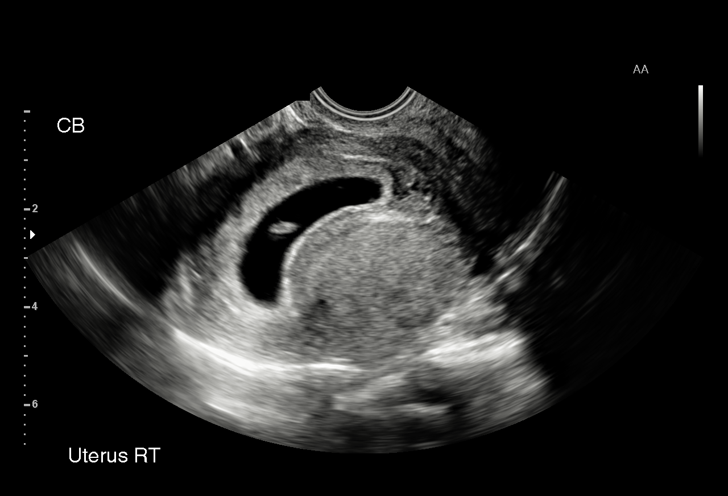
[im 19/30]
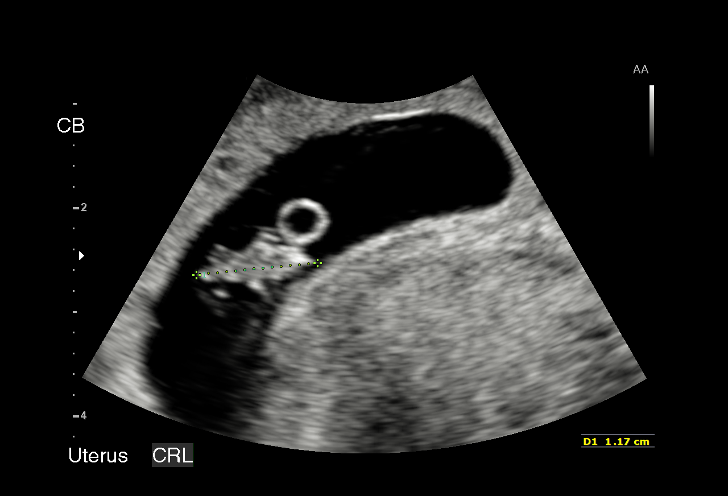
[im 21/30]
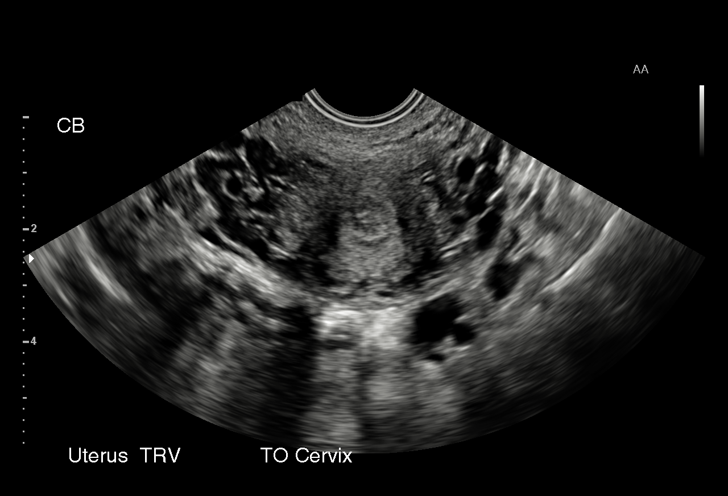
[im 23/30]
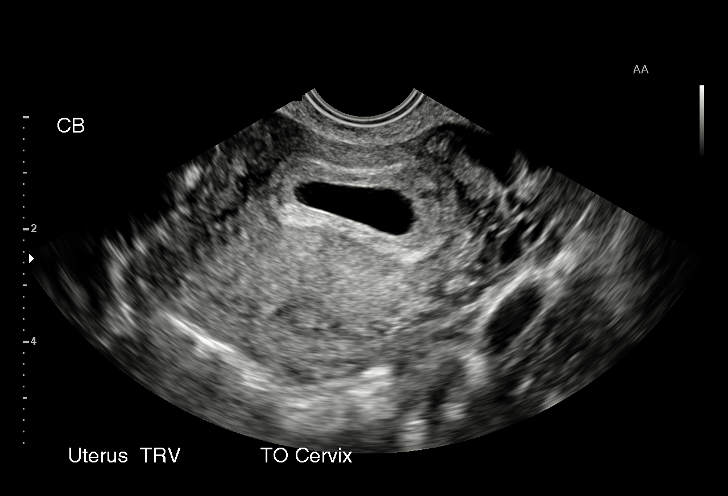
[im 25/30]
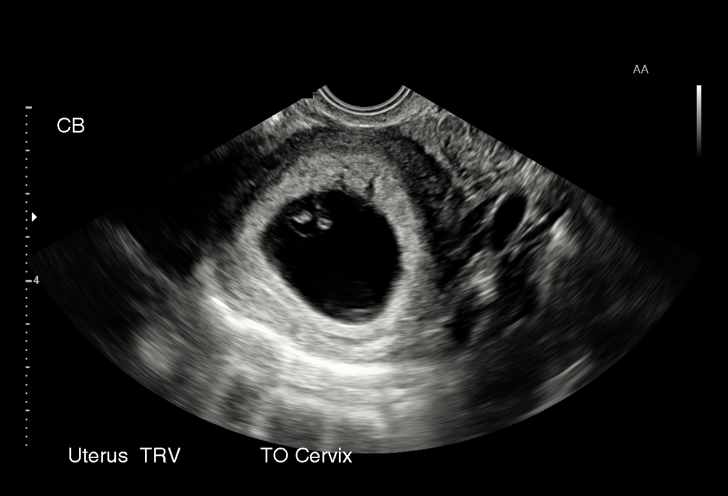
[im 27/30]
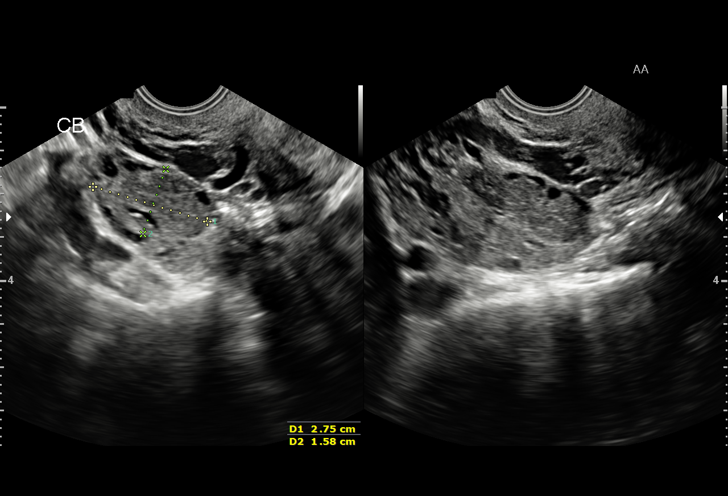
[im 30/30]
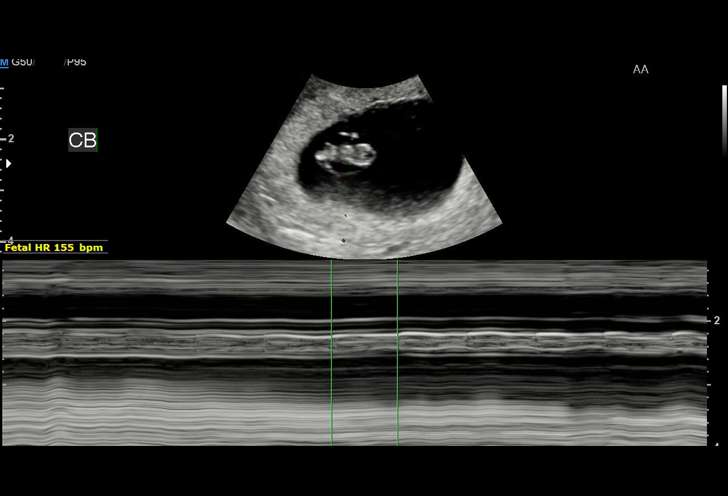

[15 of 28 positions shown; findings below may reference images not displayed]

FINDINGS: Yolk sac:  Visualized.

Embryo:  Visualized.

Cardiac Activity: Detected.

Heart Rate: 153  bpm

CRL:  1.15  cm   7 w   2 d                  US EDC: 04/07/2016.

Subchorionic hemorrhage:  None visualized.

Maternal uterus/adnexae: Unremarkable. Trace amount of free pelvic
fluid noted.
IMPRESSION: Single living intrauterine pregnancy.  No acute abnormality.

## 2017-10-06 ENCOUNTER — Inpatient Hospital Stay (HOSPITAL_COMMUNITY)
Admission: AD | Admit: 2017-10-06 | Discharge: 2017-10-06 | Disposition: A | Discharge status: Home / Self Care | Payer: Medicaid Other | Source: Ambulatory Visit | Attending: Obstetrics & Gynecology | Admitting: Obstetrics & Gynecology

## 2017-10-06 ENCOUNTER — Other Ambulatory Visit: Payer: Self-pay

## 2017-10-06 ENCOUNTER — Encounter (HOSPITAL_COMMUNITY): Payer: Self-pay

## 2017-10-06 DIAGNOSIS — R109 Unspecified abdominal pain: Secondary | ICD-10-CM | POA: Insufficient documentation

## 2017-10-06 DIAGNOSIS — Z3A25 25 weeks gestation of pregnancy: Secondary | ICD-10-CM | POA: Diagnosis not present

## 2017-10-06 DIAGNOSIS — O99512 Diseases of the respiratory system complicating pregnancy, second trimester: Secondary | ICD-10-CM | POA: Diagnosis not present

## 2017-10-06 DIAGNOSIS — O99891 Other specified diseases and conditions complicating pregnancy: Secondary | ICD-10-CM

## 2017-10-06 DIAGNOSIS — O26892 Other specified pregnancy related conditions, second trimester: Secondary | ICD-10-CM | POA: Insufficient documentation

## 2017-10-06 DIAGNOSIS — Z79899 Other long term (current) drug therapy: Secondary | ICD-10-CM | POA: Diagnosis not present

## 2017-10-06 DIAGNOSIS — J45909 Unspecified asthma, uncomplicated: Secondary | ICD-10-CM | POA: Diagnosis not present

## 2017-10-06 DIAGNOSIS — O9989 Other specified diseases and conditions complicating pregnancy, childbirth and the puerperium: Secondary | ICD-10-CM

## 2017-10-06 DIAGNOSIS — M7918 Myalgia, other site: Secondary | ICD-10-CM

## 2017-10-06 LAB — URINALYSIS, ROUTINE W REFLEX MICROSCOPIC
Bilirubin Urine: NEGATIVE
GLUCOSE, UA: NEGATIVE mg/dL
HGB URINE DIPSTICK: NEGATIVE
Ketones, ur: NEGATIVE mg/dL
Leukocytes, UA: NEGATIVE
Nitrite: NEGATIVE
Protein, ur: NEGATIVE mg/dL
Specific Gravity, Urine: 1.019 (ref 1.005–1.030)
pH: 8 (ref 5.0–8.0)

## 2017-10-06 MED ORDER — OXYCODONE-ACETAMINOPHEN 5-325 MG PO TABS
2.0000 | ORAL_TABLET | Freq: Once | ORAL | Status: AC
  Administered 2017-10-06: 2 via ORAL
  Filled 2017-10-06: qty 2

## 2017-10-06 MED ORDER — OXYCODONE-ACETAMINOPHEN 5-325 MG PO TABS: 1.0000 | ORAL_TABLET | ORAL | 0 refills | Status: DC | PRN

## 2017-10-06 NOTE — MAU Provider Note (Signed)
  History     CSN: [id number]  Arrival date and time: 10/06/17 1536   Chief Complaint  Patient presents with  . Abdominal Pain   HPI 22 yo G2P1001 at 25 weeks presents to MAU with c/o severe pubic pain.  She notes that it has been progressively worsening over the last few weeks.  She denies ctx, no leaking of fluid, no vaginal bleeding. She reports good FM.  Her first pregnancy was c/b preterm IOL for severe pre E at 35 weeks. She has not started baby ASA yet.   Past Medical History:  Diagnosis Date  . Asthma   . Preeclampsia   . Pregnancy induced hypertension     Past Surgical History:  Procedure Laterality Date  . NO PAST SURGERIES      Family History  Problem Relation Age of Onset  . Diabetes Mother   . Diabetes Maternal Grandfather     Social History   Tobacco Use  . Smoking status: Never Smoker  . Smokeless tobacco: Never Used  Substance Use Topics  . Alcohol use: No    Comment: not since pregnancy  . Drug use: No    Allergies: No Known Allergies  Medications Prior to Admission  Medication Sig Dispense Refill Last Dose  . albuterol (PROVENTIL HFA;VENTOLIN HFA) 108 (90 Base) MCG/ACT inhaler Inhale 1-2 puffs into the lungs every 6 (six) hours as needed for wheezing or shortness of breath.   More than a month at Unknown time  . loratadine (CLARITIN) 10 MG tablet Take 1 tablet (10 mg total) by mouth daily as needed for rhinitis. 20 tablet 0 More than a month at Unknown time  . metoCLOPramide (REGLAN) 10 MG tablet Take 1 tablet (10 mg total) by mouth every 6 (six) hours. 30 tablet 0   . pseudoephedrine (SUDAFED) 60 MG tablet Take 1 tablet (60 mg total) by mouth every 6 (six) hours as needed for congestion. 30 tablet 0 More than a month at Unknown time    Review of Systems Physical Exam   Blood pressure 108/72, pulse (!) 105, temperature 99 F (37.2 C), temperature source Oral, resp. rate 18, last menstrual period 04/15/2017, SpO2 100 %, unknown if currently  breastfeeding.  Physical Exam General: AAOx3 NAD EFM:  Baseline 150s moderate variability reactive for GA TOCO:  No ctx VE: Deferred Symphysis pubis: very tender to palpation  MAU Course  Rx 2 percocet for patient comfort  Counseled patient and spouse at bedside about diastasis of symphysis pubis  Assessment and Plan  Discharge patient home with f/u in office Will prescribe percocet to go home with  Discussed obtaining a maternity belt and a girdle to help with her pain. She was advised also to use a warm heating pad.   Essie HartINN, Regla Fitzgibbon STACIA 10/06/2017, 5:17 PM

## 2017-10-06 NOTE — MAU Note (Signed)
Pt states she has been having severe pubic pain. She is rating the pain 10/10 constant.   Denies vaginal bleeding. Denies LOF.

## 2017-12-07 ENCOUNTER — Encounter: Payer: Self-pay | Admitting: Hematology

## 2017-12-07 ENCOUNTER — Telehealth: Payer: Self-pay | Admitting: Hematology

## 2017-12-07 NOTE — Telephone Encounter (Signed)
Referral from Dr. Mora Appl at  [facility] Appt has been scheduled for the pt to see Dr. Candise Che on 6/4 at 11am. Pt aware to arrive 30 minutes early. Letter mailed.

## 2017-12-19 NOTE — Progress Notes (Signed)
HEMATOLOGY/ONCOLOGY CONSULTATION NOTE  Date of Service: 12/20/2017  Patient Care Team: Patient, No Pcp Per as PCP - General (General Practice)  Dr. Osborn CohoAngela Roberts as PCP  CHIEF COMPLAINTS/PURPOSE OF CONSULTATION:  Anemia in pregnancy  HISTORY OF PRESENTING ILLNESS:   Kristin Bruce is a wonderful 22 y.o. female who has been referred to us by Dr Essie HartWalda Pinn for evaluation and management of anemia in pregnancy. She is accompanied today by her mother. The pt reports that she is doing well overall. She is at [redacted] weeks gestation today.    The pt reports that her first child was a year and a half ago, and she has only been pregnant one time before. She notes that after her normal vaginal delivery at [redacted] weeks gestation, she received 3 units of blood. She took PO iron during her first pregnancy. She notes that her periods have been 4-5 days and were normal. She has not had problems with anemia outside of pregnancy but notes that her mother has had anemia.   She is currently taking a prenatal vitamin, a muscle relaxant for pelvic-ligament pain, and an iron supplement. She began taking the iron supplement in her second trimester. She notes that she will not be breast feeding after delivery. She also notes that her inducement date is set for 01/14/18.   In discussion about a possible IV Iron regimen, the pt notes that she is concerned with her transportation and is not sure if she would like to have IV Iron.  Most recent lab results (11/30/17) of CBC  is as follows: all values are WNL except for RBC at 3.13, Hgb at 8.1, HCT at 25.8, MCH at 25.9, MCHC at 31.4. Her CBC from 07/13/17 showed HGB at 10.6.   On review of systems, pt reports some fatigue, and denies light headedness, dizziness, and any other symptoms.   On PMHx the pt reports anemia in pregnancy.  On Family Hx the pt reports maternal anemia.    MEDICAL HISTORY:  Past Medical History:  Diagnosis Date  . Asthma   . Preeclampsia   .  Pregnancy induced hypertension     SURGICAL HISTORY: Past Surgical History:  Procedure Laterality Date  . NO PAST SURGERIES      SOCIAL HISTORY: Social History   Socioeconomic History  . Marital status: Single    Spouse name: Not on file  . Number of children: Not on file  . Years of education: Not on file  . Highest education level: Not on file  Occupational History  . Not on file  Social Needs  . Financial resource strain: Not on file  . Food insecurity:    Worry: Not on file    Inability: Not on file  . Transportation needs:    Medical: Not on file    Non-medical: Not on file  Tobacco Use  . Smoking status: Never Smoker  . Smokeless tobacco: Never Used  Substance and Sexual Activity  . Alcohol use: No    Comment: not since pregnancy  . Drug use: No  . Sexual activity: Yes    Comment: 10-01-2017  Lifestyle  . Physical activity:    Days per week: Not on file    Minutes per session: Not on file  . Stress: Not on file  Relationships  . Social connections:    Talks on phone: Not on file    Gets together: Not on file    Attends religious service: Not on file  Active member of club or organization: Not on file    Attends meetings of clubs or organizations: Not on file    Relationship status: Not on file  . Intimate partner violence:    Fear of current or ex partner: Not on file    Emotionally abused: Not on file    Physically abused: Not on file    Forced sexual activity: Not on file  Other Topics Concern  . Not on file  Social History Narrative  . Not on file    FAMILY HISTORY: Family History  Problem Relation Age of Onset  . Diabetes Mother   . Diabetes Maternal Grandfather     ALLERGIES:  has No Known Allergies.  MEDICATIONS:  Current Outpatient Medications  Medication Sig Dispense Refill  . albuterol (PROVENTIL HFA;VENTOLIN HFA) 108 (90 Base) MCG/ACT inhaler Inhale 1-2 puffs into the lungs every 6 (six) hours as needed for wheezing or  shortness of breath.    . loratadine (CLARITIN) 10 MG tablet Take 1 tablet (10 mg total) by mouth daily as needed for rhinitis. 20 tablet 0  . metoCLOPramide (REGLAN) 10 MG tablet Take 1 tablet (10 mg total) by mouth every 6 (six) hours. 30 tablet 0  . oxyCODONE-acetaminophen (PERCOCET/ROXICET) 5-325 MG tablet Take 1-2 tablets by mouth every 4 (four) hours as needed for severe pain. 30 tablet 0  . pseudoephedrine (SUDAFED) 60 MG tablet Take 1 tablet (60 mg total) by mouth every 6 (six) hours as needed for congestion. 30 tablet 0   No current facility-administered medications for this visit.     REVIEW OF SYSTEMS:    10 Point review of Systems was done is negative except as noted above.  PHYSICAL EXAMINATION:  . Vitals:   12/20/17 1122  BP: 107/77  Pulse: (!) 108  Resp: 18  Temp: 98.2 F (36.8 C)  SpO2: 100%   Filed Weights   12/20/17 1122  Weight: 132 lb (59.9 kg)   .Body mass index is 25.78 kg/m.  GENERAL:alert, in no acute distress and comfortable SKIN: no acute rashes, no significant lesions EYES: conjunctiva are pink and non-injected, sclera anicteric OROPHARYNX: MMM, no exudates, no oropharyngeal erythema or ulceration NECK: supple, no JVD LYMPH:  no palpable lymphadenopathy in the cervical, axillary or inguinal regions LUNGS: clear to auscultation b/l with normal respiratory effort HEART: regular rate & rhythm ABDOMEN:  normoactive bowel sounds , non tender, not distended. Extremity: no pedal edema PSYCH: alert & oriented x 3 with fluent speech NEURO: no focal motor/sensory deficits  LABORATORY DATA:  I have reviewed the data as listed  . CBC Latest Ref Rng & Units 07/13/2017 05/19/2016 03/29/2016  WBC 4.0 - 10.5 K/uL 9.0 6.0 10.1  Hemoglobin 12.0 - 15.0 g/dL 10.6(L) 10.2(L) 8.5(L)  Hematocrit 36.0 - 46.0 % 32.5(L) 32.7(L) 26.4(L)  Platelets 150 - 400 K/uL 316 510(H) 571(H)    . CMP Latest Ref Rng & Units 07/13/2017 05/19/2016 03/28/2016  Glucose 65 - 99  mg/dL 69 78 87  BUN 6 - 20 mg/dL 16 14 14   Creatinine 0.44 - 1.00 mg/dL 1.61 0.96 0.45  Sodium 135 - 145 mmol/L 134(L) 137 134(L)  Potassium 3.5 - 5.1 mmol/L 3.5 3.3(L) 4.4  Chloride 101 - 111 mmol/L 105 109 103  CO2 22 - 32 mmol/L 18(L) 20(L) 24  Calcium 8.9 - 10.3 mg/dL 4.0(J) 8.1(X) 9.1(Y)  Total Protein 6.5 - 8.1 g/dL 7.3 - 7.7  Total Bilirubin 0.3 - 1.2 mg/dL 2.0(H) - 0.4  Alkaline Phos  38 - 126 U/L 52 - 136(H)  AST 15 - 41 U/L 16 - 14(L)  ALT 14 - 54 U/L 12(L) - 17   11/30/17 CBC    RADIOGRAPHIC STUDIES: I have personally reviewed the radiological images as listed and agreed with the findings in the report. No results found.  ASSESSMENT & PLAN:  22 y.o. female with  1. Anemia in pregnancy - 3 rd trimester -Discussed patient's most recent labs from 11/30/17, HGB at 8.1. Ferritin labs not made available.  -Discussed that IV iron could reduce her need of transfusions and answered questions pt had about IV IIron and type of preparations that can be used and the fact that she would need monitoring for this. -Will order 150mg  Iron Polysaccharide BID and pt will take this with orange juice -Pt will let her OBGYN know if she develops any light headedness or dizziness as a blood transfusion, if necessary, would require fetal monitoring.  -Will order labs today -Pt will call me back later with her decision for IV iron. --since she is undecided at this time regardign receiving IV Iron.   Labs today Patient undecided about IV iron - will let us know RTC with Dr Candise Che in 8 weeks with labs    All of the patients questions were answered with apparent satisfaction. The patient knows to call the clinic with any problems, questions or concerns.  The toal time spent in the appt was 35 minutes and more than 50% was on counseling and direct patient cares.    Wyvonnia Lora MD MS AAHIVMS [facility] Hematology/Oncology Physician [facility]  (Office):       [phone] (Work  cell):  [phone] (Fax):           [phone]  12/20/2017 12:21 PM  I, Marcelline Mates, am acting as a Neurosurgeon for Dr Candise Che.   .I have reviewed the above documentation for accuracy and completeness, and I agree with the above. Johney Maine MD MS

## 2017-12-20 ENCOUNTER — Inpatient Hospital Stay: Payer: Medicaid Other | Attending: Hematology | Admitting: Hematology

## 2017-12-20 ENCOUNTER — Telehealth: Payer: Self-pay | Admitting: Hematology

## 2017-12-20 ENCOUNTER — Encounter: Payer: Self-pay | Admitting: Hematology

## 2017-12-20 ENCOUNTER — Inpatient Hospital Stay: Payer: Medicaid Other

## 2017-12-20 VITALS — BP 107/77 | HR 108 | Temp 98.2°F | Resp 18 | Ht 60.0 in | Wt 132.0 lb

## 2017-12-20 DIAGNOSIS — Z3A35 35 weeks gestation of pregnancy: Secondary | ICD-10-CM | POA: Diagnosis not present

## 2017-12-20 DIAGNOSIS — D509 Iron deficiency anemia, unspecified: Secondary | ICD-10-CM

## 2017-12-20 DIAGNOSIS — O99013 Anemia complicating pregnancy, third trimester: Secondary | ICD-10-CM | POA: Diagnosis not present

## 2017-12-20 DIAGNOSIS — O9913 Other diseases of the blood and blood-forming organs and certain disorders involving the immune mechanism complicating the puerperium: Secondary | ICD-10-CM | POA: Diagnosis present

## 2017-12-20 MED ORDER — POLYSACCHARIDE IRON COMPLEX 150 MG PO CAPS: 150.0000 mg | ORAL_CAPSULE | Freq: Two times a day (BID) | ORAL | 3 refills | Status: DC

## 2017-12-20 NOTE — Telephone Encounter (Signed)
Gave pt avs and calendar with appts per 6/4 los.  °

## 2017-12-27 ENCOUNTER — Telehealth (HOSPITAL_COMMUNITY): Payer: Self-pay | Admitting: *Deleted

## 2017-12-27 ENCOUNTER — Encounter (HOSPITAL_COMMUNITY): Payer: Self-pay | Admitting: *Deleted

## 2017-12-27 NOTE — Telephone Encounter (Signed)
Preadmission screen  

## 2017-12-28 ENCOUNTER — Other Ambulatory Visit: Payer: Self-pay | Admitting: Obstetrics and Gynecology

## 2017-12-29 ENCOUNTER — Inpatient Hospital Stay (HOSPITAL_COMMUNITY)
Admission: RE | Admit: 2017-12-29 | Discharge: 2017-12-31 | DRG: 807 | Disposition: A | Discharge status: Home / Self Care | Payer: Medicaid Other | Attending: Obstetrics and Gynecology | Admitting: Obstetrics and Gynecology

## 2017-12-29 ENCOUNTER — Inpatient Hospital Stay (HOSPITAL_COMMUNITY): Payer: Medicaid Other | Admitting: Anesthesiology

## 2017-12-29 ENCOUNTER — Other Ambulatory Visit: Payer: Self-pay

## 2017-12-29 ENCOUNTER — Encounter (HOSPITAL_COMMUNITY): Payer: Self-pay

## 2017-12-29 DIAGNOSIS — O1214 Gestational proteinuria, complicating childbirth: Principal | ICD-10-CM | POA: Diagnosis present

## 2017-12-29 DIAGNOSIS — O9902 Anemia complicating childbirth: Secondary | ICD-10-CM | POA: Diagnosis present

## 2017-12-29 DIAGNOSIS — D649 Anemia, unspecified: Secondary | ICD-10-CM | POA: Diagnosis present

## 2017-12-29 DIAGNOSIS — Z3A36 36 weeks gestation of pregnancy: Secondary | ICD-10-CM | POA: Diagnosis not present

## 2017-12-29 DIAGNOSIS — O99824 Streptococcus B carrier state complicating childbirth: Secondary | ICD-10-CM | POA: Diagnosis present

## 2017-12-29 DIAGNOSIS — O1213 Gestational proteinuria, third trimester: Secondary | ICD-10-CM | POA: Diagnosis present

## 2017-12-29 HISTORY — DX: Severe pre-eclampsia, unspecified trimester: O14.10

## 2017-12-29 HISTORY — DX: Streptococcus, group b, as the cause of diseases classified elsewhere: B95.1

## 2017-12-29 LAB — COMPREHENSIVE METABOLIC PANEL
ALT: 30 U/L (ref 14–54)
AST: 30 U/L (ref 15–41)
Albumin: 2.7 g/dL — ABNORMAL LOW (ref 3.5–5.0)
Alkaline Phosphatase: 187 U/L — ABNORMAL HIGH (ref 38–126)
Anion gap: 11 (ref 5–15)
BUN: 12 mg/dL (ref 6–20)
CO2: 21 mmol/L — ABNORMAL LOW (ref 22–32)
Calcium: 8.2 mg/dL — ABNORMAL LOW (ref 8.9–10.3)
Chloride: 103 mmol/L (ref 101–111)
Creatinine, Ser: 0.54 mg/dL (ref 0.44–1.00)
GFR calc Af Amer: >60 mL/min (ref >60)
GFR calc non Af Amer: >60 mL/min (ref >60)
Glucose, Bld: 67 mg/dL (ref 65–99)
Potassium: 3.9 mmol/L (ref 3.5–5.1)
Sodium: 135 mmol/L (ref 135–145)
Total Bilirubin: 1.6 mg/dL — ABNORMAL HIGH (ref 0.3–1.2)
Total Protein: 7.3 g/dL (ref 6.5–8.1)

## 2017-12-29 LAB — CBC
HCT: 31.4 % — ABNORMAL LOW (ref 36.0–46.0)
Hemoglobin: 9.3 g/dL — ABNORMAL LOW (ref 12.0–15.0)
MCH: 23.7 pg — ABNORMAL LOW (ref 26.0–34.0)
MCHC: 29.6 g/dL — ABNORMAL LOW (ref 30.0–36.0)
MCV: 79.9 fL (ref 78.0–100.0)
Platelets: 226 10*3/uL (ref 150–400)
RBC: 3.93 MIL/uL (ref 3.87–5.11)
RDW: 15.5 % (ref 11.5–15.5)
WBC: 9.4 10*3/uL (ref 4.0–10.5)

## 2017-12-29 LAB — PROTEIN / CREATININE RATIO, URINE
Creatinine, Urine: 181 mg/dL
Protein Creatinine Ratio: 0.43 mg/mg{Cre} — ABNORMAL HIGH (ref 0.00–0.15)
Total Protein, Urine: 77 mg/dL

## 2017-12-29 LAB — TYPE AND SCREEN
ABO/RH(D): O POS
Antibody Screen: NEGATIVE

## 2017-12-29 LAB — OB RESULTS CONSOLE GBS: STREP GROUP B AG: POSITIVE

## 2017-12-29 MED ORDER — LACTATED RINGERS IV SOLN: 500.0000 mL | Freq: Once | INTRAVENOUS | Status: DC

## 2017-12-29 MED ORDER — ACETAMINOPHEN 325 MG PO TABS: 650.0000 mg | ORAL_TABLET | ORAL | Status: DC | PRN

## 2017-12-29 MED ORDER — DIPHENHYDRAMINE HCL 50 MG/ML IJ SOLN: 12.5000 mg | INTRAMUSCULAR | Status: DC | PRN

## 2017-12-29 MED ORDER — PENICILLIN G POT IN DEXTROSE 60000 UNIT/ML IV SOLN
3.0000 10*6.[IU] | INTRAVENOUS | Status: DC
  Administered 2017-12-29 (×2): 3 10*6.[IU] via INTRAVENOUS
  Filled 2017-12-29 (×4): qty 50

## 2017-12-29 MED ORDER — DIBUCAINE 1 % RE OINT: 1.0000 "application " | TOPICAL_OINTMENT | RECTAL | Status: DC | PRN

## 2017-12-29 MED ORDER — SENNOSIDES-DOCUSATE SODIUM 8.6-50 MG PO TABS
2.0000 | ORAL_TABLET | ORAL | Status: DC
  Administered 2017-12-29 – 2017-12-31 (×2): 2 via ORAL
  Filled 2017-12-29 (×2): qty 2

## 2017-12-29 MED ORDER — LACTATED RINGERS IV SOLN: 500.0000 mL | INTRAVENOUS | Status: DC | PRN

## 2017-12-29 MED ORDER — COCONUT OIL OIL
1.0000 | TOPICAL_OIL | Status: DC | PRN
Start: 2017-12-29 — End: 2017-12-31

## 2017-12-29 MED ORDER — SODIUM CHLORIDE 0.9 % IV SOLN
5.0000 10*6.[IU] | Freq: Once | INTRAVENOUS | Status: AC
  Administered 2017-12-29: 5 10*6.[IU] via INTRAVENOUS
  Filled 2017-12-29: qty 5

## 2017-12-29 MED ORDER — ZOLPIDEM TARTRATE 5 MG PO TABS: 5.0000 mg | ORAL_TABLET | Freq: Every evening | ORAL | Status: DC | PRN

## 2017-12-29 MED ORDER — FENTANYL 2.5 MCG/ML BUPIVACAINE 1/10 % EPIDURAL INFUSION (WH - ANES)
14.0000 mL/h | INTRAMUSCULAR | Status: DC | PRN
  Administered 2017-12-29: 14 mL/h via EPIDURAL
  Filled 2017-12-29: qty 100

## 2017-12-29 MED ORDER — EPHEDRINE 5 MG/ML INJ
10.0000 mg | INTRAVENOUS | Status: DC | PRN
Start: 2017-12-29 — End: 2017-12-29
  Filled 2017-12-29: qty 2

## 2017-12-29 MED ORDER — PRENATAL MULTIVITAMIN CH
1.0000 | ORAL_TABLET | Freq: Every day | ORAL | Status: DC
  Administered 2017-12-30: 1 via ORAL
  Filled 2017-12-29: qty 1

## 2017-12-29 MED ORDER — ONDANSETRON HCL 4 MG PO TABS: 4.0000 mg | ORAL_TABLET | ORAL | Status: DC | PRN

## 2017-12-29 MED ORDER — PHENYLEPHRINE 40 MCG/ML (10ML) SYRINGE FOR IV PUSH (FOR BLOOD PRESSURE SUPPORT)
80.0000 ug | PREFILLED_SYRINGE | INTRAVENOUS | Status: DC | PRN
  Filled 2017-12-29: qty 10
  Filled 2017-12-29: qty 5

## 2017-12-29 MED ORDER — OXYTOCIN BOLUS FROM INFUSION
500.0000 mL | Freq: Once | INTRAVENOUS | Status: AC
  Administered 2017-12-29: 500 mL via INTRAVENOUS

## 2017-12-29 MED ORDER — PHENYLEPHRINE 40 MCG/ML (10ML) SYRINGE FOR IV PUSH (FOR BLOOD PRESSURE SUPPORT): 80.0000 ug | PREFILLED_SYRINGE | INTRAVENOUS | Status: DC | PRN

## 2017-12-29 MED ORDER — OXYTOCIN 40 UNITS IN LACTATED RINGERS INFUSION - SIMPLE MED
2.5000 [IU]/h | INTRAVENOUS | Status: DC
  Filled 2017-12-29: qty 1000

## 2017-12-29 MED ORDER — HYDROXYZINE HCL 50 MG PO TABS
50.0000 mg | ORAL_TABLET | Freq: Four times a day (QID) | ORAL | Status: DC | PRN
  Filled 2017-12-29: qty 1

## 2017-12-29 MED ORDER — LACTATED RINGERS IV SOLN
INTRAVENOUS | Status: DC
  Administered 2017-12-29 (×2): via INTRAUTERINE

## 2017-12-29 MED ORDER — OXYCODONE-ACETAMINOPHEN 5-325 MG PO TABS: 1.0000 | ORAL_TABLET | ORAL | Status: DC | PRN

## 2017-12-29 MED ORDER — EPHEDRINE 5 MG/ML INJ
10.0000 mg | INTRAVENOUS | Status: DC | PRN
  Filled 2017-12-29: qty 2

## 2017-12-29 MED ORDER — OXYCODONE-ACETAMINOPHEN 5-325 MG PO TABS
2.0000 | ORAL_TABLET | ORAL | Status: DC | PRN
  Administered 2017-12-30 – 2017-12-31 (×3): 1 via ORAL
  Filled 2017-12-29 (×3): qty 2

## 2017-12-29 MED ORDER — SOD CITRATE-CITRIC ACID 500-334 MG/5ML PO SOLN: 30.0000 mL | ORAL | Status: DC | PRN

## 2017-12-29 MED ORDER — LACTATED RINGERS IV SOLN
INTRAVENOUS | Status: DC
  Administered 2017-12-29 (×2): via INTRAVENOUS

## 2017-12-29 MED ORDER — PHENYLEPHRINE 40 MCG/ML (10ML) SYRINGE FOR IV PUSH (FOR BLOOD PRESSURE SUPPORT)
80.0000 ug | PREFILLED_SYRINGE | INTRAVENOUS | Status: DC | PRN
Start: 2017-12-29 — End: 2017-12-29
  Filled 2017-12-29: qty 5

## 2017-12-29 MED ORDER — TERBUTALINE SULFATE 1 MG/ML IJ SOLN
0.2500 mg | Freq: Once | INTRAMUSCULAR | Status: AC | PRN
  Administered 2017-12-29: 0.25 mg via SUBCUTANEOUS
  Filled 2017-12-29: qty 1

## 2017-12-29 MED ORDER — IBUPROFEN 600 MG PO TABS
600.0000 mg | ORAL_TABLET | Freq: Four times a day (QID) | ORAL | Status: DC
  Administered 2017-12-29 – 2017-12-31 (×7): 600 mg via ORAL
  Filled 2017-12-29 (×7): qty 1

## 2017-12-29 MED ORDER — FENTANYL CITRATE (PF) 100 MCG/2ML IJ SOLN: 50.0000 ug | INTRAMUSCULAR | Status: DC | PRN

## 2017-12-29 MED ORDER — ONDANSETRON HCL 4 MG/2ML IJ SOLN: 4.0000 mg | Freq: Four times a day (QID) | INTRAMUSCULAR | Status: DC | PRN

## 2017-12-29 MED ORDER — LIDOCAINE HCL (PF) 1 % IJ SOLN
INTRAMUSCULAR | Status: DC | PRN
  Administered 2017-12-29: 5 mL via EPIDURAL

## 2017-12-29 MED ORDER — BENZOCAINE-MENTHOL 20-0.5 % EX AERO: 1.0000 "application " | INHALATION_SPRAY | CUTANEOUS | Status: DC | PRN

## 2017-12-29 MED ORDER — MISOPROSTOL 25 MCG QUARTER TABLET
25.0000 ug | ORAL_TABLET | ORAL | Status: DC | PRN
  Administered 2017-12-29: 25 ug via VAGINAL
  Filled 2017-12-29 (×3): qty 1

## 2017-12-29 MED ORDER — EPHEDRINE 5 MG/ML INJ: 10.0000 mg | INTRAVENOUS | Status: DC | PRN

## 2017-12-29 MED ORDER — DIPHENHYDRAMINE HCL 25 MG PO CAPS: 25.0000 mg | ORAL_CAPSULE | Freq: Four times a day (QID) | ORAL | Status: DC | PRN

## 2017-12-29 MED ORDER — TETANUS-DIPHTH-ACELL PERTUSSIS 5-2.5-18.5 LF-MCG/0.5 IM SUSP: 0.5000 mL | Freq: Once | INTRAMUSCULAR | Status: DC

## 2017-12-29 MED ORDER — LIDOCAINE HCL (PF) 1 % IJ SOLN
30.0000 mL | INTRAMUSCULAR | Status: DC | PRN
  Filled 2017-12-29: qty 30

## 2017-12-29 MED ORDER — ONDANSETRON HCL 4 MG/2ML IJ SOLN: 4.0000 mg | INTRAMUSCULAR | Status: DC | PRN

## 2017-12-29 MED ORDER — WITCH HAZEL-GLYCERIN EX PADS: 1.0000 "application " | MEDICATED_PAD | CUTANEOUS | Status: DC | PRN

## 2017-12-29 MED ORDER — SIMETHICONE 80 MG PO CHEW
80.0000 mg | CHEWABLE_TABLET | ORAL | Status: DC | PRN
Start: 2017-12-29 — End: 2017-12-31

## 2017-12-29 MED ORDER — ACETAMINOPHEN 325 MG PO TABS
650.0000 mg | ORAL_TABLET | ORAL | Status: DC | PRN
  Administered 2017-12-30 (×2): 650 mg via ORAL
  Filled 2017-12-29 (×3): qty 2

## 2017-12-29 NOTE — Anesthesia Pain Management Evaluation Note (Signed)
  CRNA Pain Management Visit Note  Patient: Kristin Bruce, 22 y.o., female  "Hello I am a member of the anesthesia team at [facility]. We have an anesthesia team available at all times to provide care throughout the hospital, including epidural management and anesthesia for C-section. I don't know your plan for the delivery whether it a natural birth, water birth, IV sedation, nitrous supplementation, doula or epidural, but we want to meet your pain goals."   1.Was your pain managed to your expectations on prior hospitalizations?   Yes   2.What is your expectation for pain management during this hospitalization?     Epidural  3.How can we help you reach that goal? epidural  Record the patient's initial score and the patient's pain goal.   Pain: 0  Pain Goal: 5 [facility] wants you to be able to say your pain was always managed very well.  [name] 12/29/2017

## 2017-12-29 NOTE — Anesthesia Preprocedure Evaluation (Addendum)
Anesthesia Evaluation  Patient identified by MRN, date of birth, ID band Patient awake    Reviewed: Allergy & Precautions, NPO status , Patient's Chart, lab work & pertinent test results  Airway Mallampati: II  TM Distance: >3 FB Neck ROM: Full    Dental no notable dental hx. (+) Teeth Intact, Dental Advisory Given   Pulmonary neg pulmonary ROS, asthma ,    Pulmonary exam normal breath sounds clear to auscultation       Cardiovascular Normal cardiovascular exam Rhythm:Regular Rate:Normal     Neuro/Psych    GI/Hepatic negative GI ROS, Neg liver ROS,   Endo/Other  negative endocrine ROS  Renal/GU negative Renal ROS     Musculoskeletal   Abdominal   Peds  Hematology  (+) anemia ,   Anesthesia Other Findings   Reproductive/Obstetrics (+) Pregnancy                           Lab Results  Component Value Date   WBC 9.4 12/29/2017   HGB 9.3 (L) 12/29/2017   HCT 31.4 (L) 12/29/2017   MCV 79.9 12/29/2017   PLT 226 12/29/2017    Anesthesia Physical Anesthesia Plan  ASA: III  Anesthesia Plan: Epidural   Post-op Pain Management:    Induction:   PONV Risk Score and Plan:   Airway Management Planned:   Additional Equipment:   Intra-op Plan:   Post-operative Plan:   Informed Consent: I have reviewed the patients History and Physical, chart, labs and discussed the procedure including the risks, benefits and alternatives for the proposed anesthesia with the patient or authorized representative who has indicated his/her understanding and acceptance.     Plan Discussed with:   Anesthesia Plan Comments:         Anesthesia Quick Evaluation

## 2017-12-29 NOTE — Progress Notes (Signed)
IUPC placed by me. FSe placed my Engineer, waterMelissa RN. Pt comfortable with epidural

## 2017-12-29 NOTE — Progress Notes (Signed)
Updated Dr Mora ApplPinn on Category 2 strip and plan for epidural placement and internal monitoring

## 2017-12-29 NOTE — H&P (Addendum)
Kristin Bruce is a 22 y.o. female G2P1001 [redacted]w[redacted]d presenting for external version. Pregnancy complicated by history of asthma,hx of preeclampsia. Varicella non immune, HSV 1, Anemia, Suicidal thoughts during this pregnancy-referral to [facility], GBS positive, dislocation of symphysis pubis. OB History    Gravida  2   Para  1   Term  1   Preterm      AB      Living  1     SAB      TAB      Ectopic      Multiple  0   Live Births  1          Past Medical History:  Diagnosis Date  . Anemia   . Asthma   . Depression   . Dislocation of symphysis pubis   . History of pre-eclampsia in prior pregnancy, currently pregnant   . HSV (herpes simplex virus) anogenital infection   . Preeclampsia   . Pregnancy induced hypertension    Past Surgical History:  Procedure Laterality Date  . NO PAST SURGERIES     Family History: family history includes Diabetes in her maternal grandfather and mother; Hypertension in her mother. Social History:  reports that she has never smoked. She has never used smokeless tobacco. She reports that she does not drink alcohol or use drugs.  Results for orders placed or performed during the hospital encounter of 12/29/17 (from the past 24 hour(s))  OB RESULT CONSOLE Group B Strep     Status: None   Collection Time: 12/29/17 12:00 AM  Result Value Ref Range   GBS Positive   CBC     Status: Abnormal   Collection Time: 12/29/17  8:00 AM  Result Value Ref Range   WBC 9.4 4.0 - 10.5 K/uL   RBC 3.93 3.87 - 5.11 MIL/uL   Hemoglobin 9.3 (L) 12.0 - 15.0 g/dL   HCT 16.1 (L) 09.6 - 04.5 %   MCV 79.9 78.0 - 100.0 fL   MCH 23.7 (L) 26.0 - 34.0 pg   MCHC 29.6 (L) 30.0 - 36.0 g/dL   RDW 40.9 81.1 - 91.4 %   Platelets 226 150 - 400 K/uL  Type and screen [facility]     Status: None (Preliminary result)   Collection Time: 12/29/17  8:00 AM  Result Value Ref Range   ABO/RH(D) PENDING    Antibody Screen NEG    Sample Expiration     01/01/2018 Performed at [facility], [address], [place], [place] [address]   Comprehensive metabolic panel     Status: Abnormal   Collection Time: 12/29/17  8:00 AM  Result Value Ref Range   Sodium 135 135 - 145 mmol/L   Potassium 3.9 3.5 - 5.1 mmol/L   Chloride 103 101 - 111 mmol/L   CO2 21 (L) 22 - 32 mmol/L   Glucose, Bld 67 65 - 99 mg/dL   BUN 12 6 - 20 mg/dL   Creatinine, Ser 6.21 0.44 - 1.00 mg/dL   Calcium 8.2 (L) 8.9 - 10.3 mg/dL   Total Protein 7.3 6.5 - 8.1 g/dL   Albumin 2.7 (L) 3.5 - 5.0 g/dL   AST 30 15 - 41 U/L   ALT 30 14 - 54 U/L   Alkaline Phosphatase 187 (H) 38 - 126 U/L   Total Bilirubin 1.6 (H) 0.3 - 1.2 mg/dL   GFR calc non Af Amer >60 >60 mL/min   GFR calc Af Amer >60 >  60 mL/min   Anion gap 11 5 - 15  Protein / creatinine ratio, urine     Status: Abnormal   Collection Time: 12/29/17 12:00 PM  Result Value Ref Range   Creatinine, Urine 181.00 mg/dL   Total Protein, Urine 77 mg/dL   Protein Creatinine Ratio 0.43 (H) 0.00 - 0.15 mg/mg[Cre]     Maternal Diabetes: No Genetic Screening: Normal Maternal Ultrasounds/Referrals: Normal Fetal Ultrasounds or other Referrals:  None Maternal Substance Abuse:  No Significant Maternal Medications:  Meds include: Other: Percocet Significant Maternal Lab Results:  Lab values include: Group B Strep positive Other Comments:  None  Review of Systems  Genitourinary:       Pelvic pain  All other systems reviewed and are negative.  Maternal Medical History:  Reason for admission: Induction  Contractions: Frequency: irregular.    Fetal activity: Perceived fetal activity is normal.    Prenatal complications: Gestational Proteinuria  Prenatal Complications - Diabetes: none.      Last menstrual period 04/15/2017, unknown if currently breastfeeding. Maternal Exam:  Uterine Assessment: Contraction strength is mild.  Contraction frequency is irregular.   Abdomen: Fetal presentation: vertex  Introitus:  Normal vulva. Normal vagina.  Pelvis: adequate for delivery.   Cervix: Cervix evaluated by sterile speculum exam.     Fetal Exam Fetal Monitor Review: Mode: ultrasound.   Variability: moderate (6-25 bpm).   Pattern: accelerations present and no decelerations.    Fetal State Assessment: Category I - tracings are normal.     Physical Exam  Nursing note and vitals reviewed. Constitutional: She is oriented to person, place, and time. She appears well-developed and well-nourished. No distress.  HENT:  Head: Normocephalic and atraumatic.  Neck: Normal range of motion.  Cardiovascular: Normal rate and regular rhythm.  Respiratory: Effort normal and breath sounds normal.  GI: Soft. There is no tenderness.  Genitourinary: Vagina normal.  Musculoskeletal: Normal range of motion.  Neurological: She is alert and oriented to person, place, and time.  Skin: Skin is warm and dry.  Psychiatric: She has a normal mood and affect. Her behavior is normal. Judgment and thought content normal.    Prenatal labs: ABO, Rh: O/Positive/-- (10/30 0000) Antibody: Negative (10/30 0000) Rubella: Immune (10/30 0000) RPR: Nonreactive (10/30 0000)  HBsAg: Negative (10/30 0000)  HIV: Non-reactive (10/30 0000)  GBS:   Positive  Assessment/Plan: IUP @ 36+6 Induction of labor for Gestational Proteinuria GBS positive Anemia Bipolar   Low threshold for blood transfusion post delivery ABX prophylaxis for GBS Cytotec per vagina Anticipate Vaginal Delivery     Lori A Clemmons 12/29/2017, 7:37 AM

## 2017-12-29 NOTE — Progress Notes (Signed)
LABOR PROGRESS NOTE  Kristin Bruce is a 22 y.o. G2P1001 at 3223w6d  admitted for gestational proteinuria  Subjective: FHR had a 20 minute period of lates and early decels. AROM'D her and got back large amount mecnium stained fluid. She is very difficult to examine and I encouraged her to get her epidural and then I will place internals.  Objective: BP 112/72   Pulse (!) 115   Temp 98 F (36.7 C)   Resp 17   Ht 5' (1.524 m)   Wt 128 lb 11.2 oz (58.4 kg)   LMP 04/15/2017 (Exact Date)   SpO2 100%   BMI 25.13 kg/m  or  Vitals:   12/29/17 0754 12/29/17 1419  BP: 115/80 112/72  Pulse: 98 (!) 115  Resp: 17   Temp: 98.5 F (36.9 C) 98 F (36.7 C)  TempSrc: Oral   SpO2: 100%   Weight: 128 lb 11.2 oz (58.4 kg)   Height: 5' (1.524 m)      Dilation: 3.5 Effacement (%): 60 Station: -1 Presentation: Vertex Exam by:: m wilkins rnc  Labs: Lab Results  Component Value Date   WBC 9.4 12/29/2017   HGB 9.3 (L) 12/29/2017   HCT 31.4 (L) 12/29/2017   MCV 79.9 12/29/2017   PLT 226 12/29/2017    Patient Active Problem List   Diagnosis Date Noted  . Gestational proteinuria without hypertension during pregnancy in third trimester 12/29/2017  . Gestational proteinuria in third trimester 12/29/2017  . Asthma affecting pregnancy, antepartum 03/16/2016  . Anemia of pregnancy 01/07/2016  . Adjustment disorder with depressed mood 11/22/2015    Assessment / Plan: Plan to place internals  If reactive strip will start pitocin Fetal Wellbeing:  Cat 1 and Cat 2 Pain Control:  Epidural Anticipated MOD:  SVD  Lori A Clemmons CNM 12/29/2017, 5:57 PM

## 2017-12-29 NOTE — Progress Notes (Signed)
LABOR PROGRESS NOTE  Kristin Bruce is a 22 y.o. G2P1001 at 7381w6d  admitted for gestational proteinuria  Subjective: Sleeping with contractions but is hyperstim with contractions lasting 1 1/2 - 2 minutes. FHR showing a variation of late and early decels. Vag exam done to retrieve cytotec but unable to find. Terbutaline given to space out contractions  Objective: BP 112/72   Pulse (!) 115   Temp 98 F (36.7 C)   Resp 17   Ht 5' (1.524 m)   Wt 128 lb 11.2 oz (58.4 kg)   LMP 04/15/2017 (Exact Date)   SpO2 100%   BMI 25.13 kg/m  or  Vitals:   12/29/17 0754 12/29/17 1419  BP: 115/80 112/72  Pulse: 98 (!) 115  Resp: 17   Temp: 98.5 F (36.9 C) 98 F (36.7 C)  TempSrc: Oral   SpO2: 100%   Weight: 128 lb 11.2 oz (58.4 kg)   Height: 5' (1.524 m)      SVE- does not tolerate vaginal exams well due to dislocation of symphysis pubis  Labs: Lab Results  Component Value Date   WBC 9.4 12/29/2017   HGB 9.3 (L) 12/29/2017   HCT 31.4 (L) 12/29/2017   MCV 79.9 12/29/2017   PLT 226 12/29/2017    Patient Active Problem List   Diagnosis Date Noted  . Gestational proteinuria without hypertension during pregnancy in third trimester 12/29/2017  . Gestational proteinuria in third trimester 12/29/2017  . Asthma affecting pregnancy, antepartum 03/16/2016  . Anemia of pregnancy 01/07/2016  . Adjustment disorder with depressed mood 11/22/2015    Assessment / Plan: Hyperstimulation   Terbutaline given x 1 dose Dr Normand Sloopillard given sign out on pt  Fetal Wellbeing:  Cat 2 Pain Control:  N/A Anticipated MOD:  SVD  Kristin Bruce A Kristin Bruce CNM 12/29/2017, 5:52 PM

## 2017-12-29 NOTE — Anesthesia Procedure Notes (Signed)
Epidural Patient location during procedure: OB Start time: 12/29/2017 6:12 PM End time: 12/29/2017 6:24 PM  Staffing Anesthesiologist: Trevor IhaHouser, Stephen A, MD Performed: anesthesiologist   Preanesthetic Checklist Completed: patient identified, site marked, surgical consent, pre-op evaluation, timeout performed, IV checked, risks and benefits discussed and monitors and equipment checked  Epidural Patient position: sitting Prep: site prepped and draped and DuraPrep Patient monitoring: continuous pulse ox and blood pressure Approach: midline Location: L3-L4 Injection technique: LOR air  Needle:  Needle type: Tuohy  Needle gauge: 17 G Needle length: 9 cm and 9 Needle insertion depth: 4 cm Catheter type: closed end flexible Catheter size: 19 Gauge Catheter at skin depth: 9 cm Test dose: negative  Assessment Events: blood not aspirated, injection not painful, no injection resistance, negative IV test and no paresthesia

## 2017-12-30 LAB — CBC
HEMATOCRIT: 28.2 % — AB (ref 36.0–46.0)
HEMOGLOBIN: 8.5 g/dL — AB (ref 12.0–15.0)
MCH: 23.9 pg — ABNORMAL LOW (ref 26.0–34.0)
MCHC: 30.1 g/dL (ref 30.0–36.0)
MCV: 79.4 fL (ref 78.0–100.0)
Platelets: 188 10*3/uL (ref 150–400)
RBC: 3.55 MIL/uL — ABNORMAL LOW (ref 3.87–5.11)
RDW: 15.5 % (ref 11.5–15.5)
WBC: 10.6 10*3/uL — ABNORMAL HIGH (ref 4.0–10.5)

## 2017-12-30 LAB — RPR: RPR Ser Ql: NONREACTIVE

## 2017-12-30 MED ORDER — VARICELLA VIRUS VACCINE LIVE 1350 PFU/0.5ML IJ SUSR: 0.5000 mL | Freq: Once | INTRAMUSCULAR | Status: DC

## 2017-12-30 MED ORDER — FERROUS SULFATE 325 (65 FE) MG PO TABS
325.0000 mg | ORAL_TABLET | Freq: Two times a day (BID) | ORAL | Status: DC
  Administered 2017-12-30 – 2017-12-31 (×2): 325 mg via ORAL
  Filled 2017-12-30 (×2): qty 1

## 2017-12-30 NOTE — Progress Notes (Addendum)
Per H&P: Kristin Bruce is a 22 y.o. female G2P1001 3576w6d presenting for external version. Pregnancy complicated by history of asthma,hx of preeclampsia. Varicella non immune, HSV 1, Anemia, Suicidal thoughts during this pregnancy-referral to [facility], GBS positive, dislocation of symphysis pubis.  Subjective: Postpartum Day # 1 : S/P NSVD due to IOL gestational proteinuria. Pt found in bathroom with partner at bedside in NAD. Patient up ad lib, denies syncope or dizziness. Reports consuming regular diet without issues and denies N/V. Patient reports 0 bowel movement, is passing flatus.  Denies issues with urination and reports bleeding is "light."  Patient is Bottle feeding and reports going well.  Desires talked about depo shot before discharge from the hospital , then possibly doing the patch at 6 weeks PPV for postpartum contraception.  Pain is being appropriately managed with use of motrin and tylenol.   No laceration Feeding:  Bottle Contraceptive plan:  Possible deop at d/c then patch at 6 PPV Baby Girl  Objective: Vital signs in last 24 hours: Patient Vitals for the past 24 hrs:  BP Temp Temp src Pulse Resp SpO2  12/30/17 0330 122/66 98.1 F (36.7 C) Oral 78 17 99 %  12/29/17 2330 111/69 99.2 F (37.3 C) Oral 97 17 99 %  12/29/17 2220 (!) 127/91 98.8 F (37.1 C) Oral 88 17 100 %  12/29/17 2131 116/87 98.2 F (36.8 C) Oral - 19 -  12/29/17 2119 116/74 - - 91 18 -  12/29/17 2101 113/78 - - 75 17 -  12/29/17 2046 104/72 - - 82 18 -  12/29/17 2032 113/69 98.6 F (37 C) Oral 87 19 -  12/29/17 2001 110/76 - - 94 18 -  12/29/17 1935 98/73 - - (!) 115 18 -  12/29/17 1931 112/76 - - 96 16 -  12/29/17 1919 - 99.4 F (37.4 C) Oral - - -  12/29/17 1901 100/65 - - 98 - -  12/29/17 1844 - 99.4 F (37.4 C) - - - -  12/29/17 1841 127/88 - - (!) 109 - -  12/29/17 1836 122/90 - - (!) 105 - -  12/29/17 1831 119/84 - - 99 - -  12/29/17 1827 (!) 131/98 - - (!) 121 - -  12/29/17 1826  (!) 131/98 - - (!) 121 - -  12/29/17 1820 (!) 125/91 - - (!) 104 - -  12/29/17 1419 112/72 98 F (36.7 C) - (!) 115 - -     Physical Exam:  General: alert, cooperative and appears stated age Mood/Affect: Flat  Lungs: clear to auscultation, no wheezes, rales or rhonchi, symmetric air entry.  Heart: normal rate, regular rhythm, normal S1, S2, no murmurs, rubs, clicks or gallops. Breast: breasts appear normal, no suspicious masses, no skin or nipple changes or axillary nodes. Abdomen:  + bowel sounds, soft, non-tender GU: perineum Intact, healing well. No signs of external hematomas.  Uterine Fundus: firm Lochia: appropriate Skin: Warm, Dry. DVT Evaluation: No evidence of DVT seen on physical exam. Negative Homan's sign. No cords or calf tenderness. No significant calf/ankle edema.  CBC Latest Ref Rng & Units 12/30/2017 12/29/2017 07/13/2017  WBC 4.0 - 10.5 K/uL 10.6(H) 9.4 9.0  Hemoglobin 12.0 - 15.0 g/dL 6.3(O8.5(L) 7.5(I9.3(L) 10.6(L)  Hematocrit 36.0 - 46.0 % 28.2(L) 31.4(L) 32.5(L)  Platelets 150 - 400 K/uL 188 226 316    Results for orders placed or performed during the hospital encounter of 12/29/17 (from the past 24 hour(s))  Protein / creatinine ratio, urine  Status: Abnormal   Collection Time: 12/29/17 12:00 PM  Result Value Ref Range   Creatinine, Urine 181.00 mg/dL   Total Protein, Urine 77 mg/dL   Protein Creatinine Ratio 0.43 (H) 0.00 - 0.15 mg/mg[Cre]  CBC     Status: Abnormal   Collection Time: 12/30/17  5:49 AM  Result Value Ref Range   WBC 10.6 (H) 4.0 - 10.5 K/uL   RBC 3.55 (L) 3.87 - 5.11 MIL/uL   Hemoglobin 8.5 (L) 12.0 - 15.0 g/dL   HCT 16.1 (L) 09.6 - 04.5 %   MCV 79.4 78.0 - 100.0 fL   MCH 23.9 (L) 26.0 - 34.0 pg   MCHC 30.1 30.0 - 36.0 g/dL   RDW 40.9 81.1 - 91.4 %   Platelets 188 150 - 400 K/uL     CBG (last 3)  No results for input(s): GLUCAP in the last 72 hours.   I/O last 3 completed shifts: In: -  Out: 350 [Urine:300; Blood:50]    Assessment Postpartum Day # 1 : S/P NSVD due to IOL for gestational proteinuria. Pt stable. -2 involution. Bottle feeding. Hemodynamically stable.   Plan: Continue other mgmt as ordered VTE prophylactics: Early ambulated as tolerates.  Pain control: Motrin/Tylenol PRN Anemia: Iron started today.  Education given regarding options for contraception, including Depo at d/c possible then patch at 6w PPV, educated about all forms of BC .  Plan for discharge tomorrow and Social Work consult  Dr. Sallye Ober to be updated on patient status  [place], JADE NP-C, CNM 12/30/2017, 8:36 AM

## 2017-12-30 NOTE — Progress Notes (Signed)
MOB was referred for history of depression/anxiety. * Referral screened out by Clinical Social Worker because none of the following criteria appear to apply: ~ History of anxiety/depression during this pregnancy, or of post-partum depression. No concerns noted in OB record.  CSW completed clinical assessment on 03/19/2016 for same consult; no concerns noted.  ~ Diagnosis of anxiety and/or depression within last 3 years OR * MOB's symptoms currently being treated with medication and/or therapy.  Please contact the Clinical Social Worker if needs arise, by MOB request, or if MOB scores greater than 9/yes to question 10 on Edinburgh Postpartum Depression Screen.  Miray Mancino Boyd-Gilyard, MSW, LCSW Clinical Social Work [phone]   

## 2017-12-30 NOTE — Anesthesia Postprocedure Evaluation (Signed)
Anesthesia Post Note  Patient: Kristin Bruce  Procedure(s) Performed: AN AD HOC LABOR EPIDURAL     Patient location during evaluation: Mother Baby Anesthesia Type: Epidural Level of consciousness: awake and alert, oriented and patient cooperative Pain management: pain level controlled Vital Signs Assessment: post-procedure vital signs reviewed and stable Respiratory status: spontaneous breathing Cardiovascular status: stable Postop Assessment: no headache, epidural receding, patient able to bend at knees and no signs of nausea or vomiting Anesthetic complications: no Comments: Pain score 4.    Last Vitals:  Vitals:   12/29/17 2330 12/30/17 0330  BP: 111/69 122/66  Pulse: 97 78  Resp: 17 17  Temp: 37.3 C 36.7 C  SpO2: 99% 99%    Last Pain:  Vitals:   12/30/17 0700  TempSrc:   PainSc: Asleep   Pain Goal:                 [facility] HospitalWRINKLE,Kristin Bruce

## 2017-12-31 ENCOUNTER — Encounter: Payer: Self-pay | Admitting: Women's Health

## 2017-12-31 MED ORDER — IBUPROFEN 600 MG PO TABS: 600.0000 mg | ORAL_TABLET | Freq: Four times a day (QID) | ORAL | 0 refills | Status: DC | PRN

## 2017-12-31 NOTE — Discharge Summary (Addendum)
OB Discharge Summary     Patient Name: Kristin OatsKennita L Bruce DOB: 1996-06-05 MRN: [id number]  Date of admission: 12/29/2017 Delivering MD: Janeece RiggersGREER, Tyaira Heward K   Date of discharge: 12/31/2017  Admitting diagnosis: INDUCTION Intrauterine pregnancy: 3389w6d     Secondary diagnosis:  Active Problems:   * No active hospital problems. *  Patient complaining of back pain at epidural site. Rated pain 10/10 to RN but patient's affect does not reflect this. CRNA consulted, they have no concerns at this time. Patient to apply heat to area for management of discomfort. Stable mood.      Discharge diagnosis: Preterm Pregnancy Delivered                                                                                                Post partum procedures:n/a  Augmentation: AROM, Pitocin and Cytotec  Complications: None  Hospital course:  Induction of Labor With Vaginal Delivery   22 y.o. yo G2P1001 at 3289w6d was admitted to the hospital 12/29/2017 for induction of labor.  Indication for induction: gestational proteinuria with history of preeclampsia.  Patient had an uncomplicated labor course as follows: Membrane Rupture Time/Date: 5:37 PM ,12/29/2017   Intrapartum Procedures: Episiotomy: None [1]                                         Lacerations:  None [1]  Patient had delivery of a Viable infant.  Information for the patient's newborn:  Linard MillersJones, Girl Mayari [[id number]][[id number]]  Delivery Method: Vag-Spont   12/29/2017  Details of delivery can be found in separate delivery note.  Patient had a routine postpartum course. Patient is discharged home 12/31/17.  Physical exam  Vitals:   12/30/17 0330 12/30/17 1013 12/30/17 1439 12/30/17 2228  BP: 122/66 120/70 116/81 118/89  Pulse: 78 70 82 61  Resp: 17 18 18    Temp: 98.1 F (36.7 C) (!) 97.4 F (36.3 C) 99.1 F (37.3 C) (!) 97.5 F (36.4 C)  TempSrc: Oral Axillary Oral Oral  SpO2: 99%   100%  Weight:      Height:       General: alert, cooperative and no  distress Lochia: appropriate Uterine Fundus: firm Incision: N/A DVT Evaluation: No evidence of DVT seen on physical exam. Negative Homan's sign. No cords or calf tenderness. No significant calf/ankle edema.  Labs: Lab Results  Component Value Date   WBC 10.6 (H) 12/30/2017   HGB 8.5 (L) 12/30/2017   HCT 28.2 (L) 12/30/2017   MCV 79.4 12/30/2017   PLT 188 12/30/2017   CMP Latest Ref Rng & Units 12/29/2017  Glucose 65 - 99 mg/dL 67  BUN 6 - 20 mg/dL 12  Creatinine 7.820.44 - 9.561.00 mg/dL 2.130.54  Sodium 086135 - 578145 mmol/L 135  Potassium 3.5 - 5.1 mmol/L 3.9  Chloride 101 - 111 mmol/L 103  CO2 22 - 32 mmol/L 21(L)  Calcium 8.9 - 10.3 mg/dL 8.2(L)  Total Protein 6.5 - 8.1 g/dL 7.3  Total Bilirubin 0.3 - 1.2  mg/dL 1.6(X)  Alkaline Phos 38 - 126 U/L 187(H)  AST 15 - 41 U/L 30  ALT 14 - 54 U/L 30    Discharge instruction: per After Visit Summary and "Baby and Me Booklet". Postpartum depression and anxiety education provided.   After visit meds:  Allergies as of 12/31/2017   No Known Allergies     Medication List    STOP taking these medications   iron polysaccharides 150 MG capsule Commonly known as:  NIFEREX   metoCLOPramide 10 MG tablet Commonly known as:  REGLAN   oxyCODONE-acetaminophen 5-325 MG tablet Commonly known as:  PERCOCET/ROXICET     TAKE these medications   ibuprofen 600 MG tablet Commonly known as:  ADVIL,MOTRIN Take 1 tablet (600 mg total) by mouth every 6 (six) hours as needed for moderate pain or cramping.   prenatal multivitamin Tabs tablet Take 1 tablet by mouth daily at 12 noon.       Diet: routine diet  Activity: Advance as tolerated. Pelvic rest for 6 weeks.   Outpatient follow up:6 weeks, will need varicella immunization   Follow up Appt: Future Appointments  Date Time Provider Department Center  02/14/2018  1:30 PM [facility] 6 [facility] None  02/14/2018  2:00 PM Johney Maine, MD [facility] None   Follow up Visit:No  follow-ups on file.  Postpartum contraception: Contraceptive patch  Newborn Data: Live born female  Birth Weight: 6 lb 3.5 oz (2820 g) APGAR: 8, 9  Newborn Delivery   Birth date/time:  12/29/2017 20:17:00 Delivery type:  Vaginal, Spontaneous     Baby Feeding: Bottle Disposition:home with mother   12/31/2017 Janeece Riggers, CNM

## 2017-12-31 NOTE — Discharge Instructions (Signed)
Postpartum Care After Vaginal Delivery °The period of time right after you deliver your newborn is called the postpartum period. °What kind of medical care will I receive? °· You may continue to receive fluids and medicines through an IV tube inserted into one of your veins. °· If an incision was made near your vagina (episiotomy) or if you had some vaginal tearing during delivery, cold compresses may be placed on your episiotomy or your tear. This helps to reduce pain and swelling. °· You may be given a squirt bottle to use when you go to the bathroom. You may use this until you are comfortable wiping as usual. To use the squirt bottle, follow these steps: °? Before you urinate, fill the squirt bottle with warm water. Do not use hot water. °? After you urinate, while you are sitting on the toilet, use the squirt bottle to rinse the area around your urethra and vaginal opening. This rinses away any urine and blood. °? You may do this instead of wiping. As you start healing, you may use the squirt bottle before wiping yourself. Make sure to wipe gently. °? Fill the squirt bottle with clean water every time you use the bathroom. °· You will be given sanitary pads to wear. °How can I expect to feel? °· You may not feel the need to urinate for several hours after delivery. °· You will have some soreness and pain in your abdomen and vagina. °· If you are breastfeeding, you may have uterine contractions every time you breastfeed for up to several weeks postpartum. Uterine contractions help your uterus return to its normal size. °· It is normal to have vaginal bleeding (lochia) after delivery. The amount and appearance of lochia is often similar to a menstrual period in the first week after delivery. It will gradually decrease over the next few weeks to a dry, yellow-brown discharge. For most women, lochia stops completely by 6-8 weeks after delivery. Vaginal bleeding can vary from woman to woman. °· Within the first few  days after delivery, you may have breast engorgement. This is when your breasts feel heavy, full, and uncomfortable. Your breasts may also throb and feel hard, tightly stretched, warm, and tender. After this occurs, you may have milk leaking from your breasts. Your health care provider can help you relieve discomfort due to breast engorgement. Breast engorgement should go away within a few days. °· You may feel more sad or worried than normal due to hormonal changes after delivery. These feelings should not last more than a few days. If these feelings do not go away after several days, speak with your health care provider. °How should I care for myself? °· Tell your health care provider if you have pain or discomfort. °· Drink enough water to keep your urine clear or pale yellow. °· Wash your hands thoroughly with soap and water for at least 20 seconds after changing your sanitary pads, after using the toilet, and before holding or feeding your baby. °· If you are not breastfeeding, avoid touching your breasts a lot. Doing this can make your breasts produce more milk. °· If you become weak or lightheaded, or you feel like you might faint, ask for help before: °? Getting out of bed. °? Showering. °· Change your sanitary pads frequently. Watch for any changes in your flow, such as a sudden increase in volume, a change in color, the passing of large blood clots. If you pass a blood clot from your vagina, save it   to show to your health care provider. Do not flush blood clots down the toilet without having your health care provider look at them.  Make sure that all your vaccinations are up to date. This can help protect you and your baby from getting certain diseases. You may need to have immunizations done before you leave the hospital.  If desired, talk with your health care provider about methods of family planning or birth control (contraception). How can I start bonding with my baby? Spending as much time as  possible with your baby is very important. During this time, you and your baby can get to know each other and develop a bond. Having your baby stay with you in your room (rooming in) can give you time to get to know your baby. Rooming in can also help you become comfortable caring for your baby. Breastfeeding can also help you bond with your baby. How can I plan for returning home with my baby?  Make sure that you have a car seat installed in your vehicle. ? Your car seat should be checked by a certified car seat installer to make sure that it is installed safely. ? Make sure that your baby fits into the car seat safely.  Ask your health care provider any questions you have about caring for yourself or your baby. Make sure that you are able to contact your health care provider with any questions after leaving the hospital. This information is not intended to replace advice given to you by your health care provider. Make sure you discuss any questions you have with your health care provider. Document Released: 05/02/2007 Document Revised: 12/08/2015 Document Reviewed: 06/09/2015 Elsevier Interactive Patient Education  2018 [employer]   Postpartum Depression and Baby Blues The postpartum period begins right after the birth of a baby. During this time, there is often a great amount of joy and excitement. It is also a time of many changes in the life of the parents. Regardless of how many times a mother gives birth, each child brings new challenges and dynamics to the family. It is not unusual to have feelings of excitement along with confusing shifts in moods, emotions, and thoughts. All mothers are at risk of developing postpartum depression or the "baby blues." These mood changes can occur right after giving birth, or they may occur many months after giving birth. The baby blues or postpartum depression can be mild or severe. Additionally, postpartum depression can go away rather quickly, or it can  be a long-term condition. What are the causes? Raised hormone levels and the rapid drop in those levels are thought to be a main cause of postpartum depression and the baby blues. A number of hormones change during and after pregnancy. Estrogen and progesterone usually decrease right after the delivery of your baby. The levels of thyroid hormone and various cortisol steroids also rapidly drop. Other factors that play a role in these mood changes include major life events and genetics. What increases the risk? If you have any of the following risks for the baby blues or postpartum depression, know what symptoms to watch out for during the postpartum period. Risk factors that may increase the likelihood of getting the baby blues or postpartum depression include:  Having a personal or family history of depression.  Having depression while being pregnant.  Having premenstrual mood issues or mood issues related to oral contraceptives.  Having a lot of life stress.  Having marital conflict.  Lacking a social  support network.  Having a baby with special needs.  Having health problems, such as diabetes.  What are the signs or symptoms? Symptoms of baby blues include:  Brief changes in mood, such as going from extreme happiness to sadness.  Decreased concentration.  Difficulty sleeping.  Crying spells, tearfulness.  Irritability.  Anxiety.  Symptoms of postpartum depression typically begin within the first month after giving birth. These symptoms include:  Difficulty sleeping or excessive sleepiness.  Marked weight loss.  Agitation.  Feelings of worthlessness.  Lack of interest in activity or food.  Postpartum psychosis is a very serious condition and can be dangerous. Fortunately, it is rare. Displaying any of the following symptoms is cause for immediate medical attention. Symptoms of postpartum psychosis include:  Hallucinations and delusions.  Bizarre or disorganized  behavior.  Confusion or disorientation.  How is this diagnosed? A diagnosis is made by an evaluation of your symptoms. There are no medical or lab tests that lead to a diagnosis, but there are various questionnaires that a health care provider may use to identify those with the baby blues, postpartum depression, or psychosis. Often, a screening tool called the [place] Postnatal Depression Scale is used to diagnose depression in the postpartum period. How is this treated? The baby blues usually goes away on its own in 1-2 weeks. Social support is often all that is needed. You will be encouraged to get adequate sleep and rest. Occasionally, you may be given medicines to help you sleep. Postpartum depression requires treatment because it can last several months or longer if it is not treated. Treatment may include individual or group therapy, medicine, or both to address any social, physiological, and psychological factors that may play a role in the depression. Regular exercise, a healthy diet, rest, and social support may also be strongly recommended. Postpartum psychosis is more serious and needs treatment right away. Hospitalization is often needed. Follow these instructions at home:  Get as much rest as you can. Nap when the baby sleeps.  Exercise regularly. Some women find yoga and walking to be beneficial.  Eat a balanced and nourishing diet.  Do little things that you enjoy. Have a cup of tea, take a bubble bath, read your favorite magazine, or listen to your favorite music.  Avoid alcohol.  Ask for help with household chores, cooking, grocery shopping, or running errands as needed. Do not try to do everything.  Talk to people close to you about how you are feeling. Get support from your partner, family members, friends, or other new moms.  Try to stay positive in how you think. Think about the things you are grateful for.  Do not spend a lot of time alone.  Only take  over-the-counter or prescription medicine as directed by your health care provider.  Keep all your postpartum appointments.  Let your health care provider know if you have any concerns. Contact a health care provider if: You are having a reaction to or problems with your medicine. Get help right away if:  You have suicidal feelings.  You think you may harm the baby or someone else. This information is not intended to replace advice given to you by your health care provider. Make sure you discuss any questions you have with your health care provider. Document Released: 04/08/2004 Document Revised: 12/11/2015 Document Reviewed: 04/16/2013 Elsevier Interactive Patient Education  2017 [employer] Inc[employer]   Perinatal Depression When a woman feels excessive sadness, anger, or anxiety during pregnancy or during the first  12 months after she gives birth, she has a condition called perinatal depression. Depression can interfere with work, school, relationships, and other everyday activities. If it is not managed properly, it can also cause problems in the mother and her baby. Sometimes, perinatal depression is left untreated because symptoms are thought to be normal mood swings during and right after pregnancy. If you have symptoms of depression, it is important to talk with your health care provider. What are the causes? The exact cause of this condition is not known. Hormonal changes during and after pregnancy may play a role in causing perinatal depression. What increases the risk? You are more likely to develop this condition if:  You have a personal or family history of depression, anxiety, or mood disorders.  You experience a stressful life event during pregnancy, such as the death of a loved one.  You have a lot of regular life stress.  You do not have support from family members or loved ones, or you are in an abusive relationship.  What are the signs or symptoms? Symptoms of this  condition include:  Feeling sad or hopeless.  Feelings of guilt.  Feeling irritable or overwhelmed.  Changes in your appetite.  Lack of energy or motivation.  Sleep problems.  Difficulty concentrating or completing tasks.  Loss of interest in hobbies or relationships.  Headaches or stomach problems that do not go away.  How is this diagnosed? This condition is diagnosed based on a physical exam and mental evaluation. In some cases, your health care provider may use a depression screening tool. These tools include a list of questions that can help a health care provider diagnose depression. Your health care provider may refer you to a mental health expert who specializes in depression. How is this treated? This condition may be treated with:  Medicines. Your health care provider will only give you medicines that have been proven safe for pregnancy and breastfeeding.  Talk therapy with a mental health professional to help change your patterns of thinking (cognitive behavioral therapy).  Support groups.  Brain stimulation or light therapies.  Stress reduction therapies, such as mindfulness.  Follow these instructions at home: Lifestyle  Do not use any products that contain nicotine or tobacco, such as cigarettes and e-cigarettes. If you need help quitting, ask your health care provider.  Do not use alcohol when you are pregnant. After your baby is born, limit alcohol intake to no more than 1 drink a day. One drink equals 12 oz of beer, 5 oz of wine, or 1 oz of hard liquor.  Consider joining a support group for new mothers. Ask your health care provider for recommendations.  Take good care of yourself. Make sure you: ? Get plenty of sleep. If you are having trouble sleeping, talk with your health care provider. ? Eat a healthy diet. This includes plenty of fruits and vegetables, whole grains, and lean proteins. ? Exercise regularly, as told by your health care provider.  Ask your health care provider what exercises are safe for you. General instructions  Take over-the-counter and prescription medicines only as told by your health care provider.  Talk with your partner or family members about your feelings during pregnancy. Share any concerns or anxieties that you may have.  Ask for help with tasks or chores when you need it. Ask friends and family members to provide meals, watch your children, or help with cleaning.  Keep all follow-up visits as told by your health care provider.  This is important. Contact a health care provider if:  You (or people close to you) notice that you have any symptoms of depression.  You have depression and your symptoms get worse.  You experience side effects from medicines, such as nausea or sleep problems. Get help right away if:  You feel like hurting yourself, your baby, or someone else. If you ever feel like you may hurt yourself or others, or have thoughts about taking your own life, get help right away. You can go to your nearest emergency department or call:  Your local emergency services (911 in the [place]  A suicide crisis helpline, such as the National Suicide Prevention Lifeline at 775 831 5473. This is open 24 hours a day.  Summary  Perinatal depression is when a woman feels excessive sadness, anger, or anxiety during pregnancy or during the first 12 months after she gives birth.  If perinatal depression is not treated, it can lead to health problems for the mother and her baby.  This condition is treated with medicines, talk therapy, stress reduction therapies, or a combination of two or more treatments.  Talk with your partner or family members about your feelings. Do not be afraid to ask for help. This information is not intended to replace advice given to you by your health care provider. Make sure you discuss any questions you have with your health care provider. Document Released: 09/01/2016  Document Revised: 09/01/2016 Document Reviewed: 09/01/2016 Elsevier Interactive Patient Education  2018 Elsevier Inc.   Perinatal Anxiety When a woman feels excessive tension or worry (anxiety) during pregnancy or during the first 12 months after she gives birth, she has a condition called perinatal anxiety. Anxiety can interfere with work, school, relationships, and other everyday activities. If it is not managed properly, it can also cause problems in the mother and her baby.  If you are pregnant and you have symptoms of an anxiety disorder, it is important to talk with your health care provider. What are the causes? The exact cause of this condition is not known. Hormonal changes during and after pregnancy may play a role in causing perinatal anxiety. What increases the risk? You are more likely to develop this condition if:  You have a personal or family history of depression, anxiety, or mood disorders.  You experience a stressful life event during pregnancy, such as the death of a loved one.  You have a lot of regular life stress, such as being a single parent.  You have thyroid problems.  What are the signs or symptoms? Perinatal anxiety can be different for everyone. It may include:  Panic attacks (panic disorder). These are intense episodes of fear or discomfort that may also cause sweating, nausea, shortness of breath, or fear of dying. They usually last 5-15 minutes.  Reliving an upsetting (traumatic) event through distressing thoughts, dreams, or flashbacks (post-traumatic stress disorder, or PTSD).  Excessive worry about multiple problems (generalized anxiety disorder).  Fear and stress about leaving certain people or loved ones (separation anxiety).  Performing repetitive tasks (compulsions) to relieve stress or worry (obsessive compulsive disorder, or OCD).  Fear of certain objects or situations (phobias).  Excessive worrying, such as a constant feeling that  something bad is going to happen.  Inability to relax.  Difficulty concentrating.  Sleep problems.  Frequent nightmares or disturbing thoughts.  How is this diagnosed? This condition is diagnosed based on a physical exam and mental evaluation. In some cases, your health care provider may use an  anxiety screening tool. These tools include a list of questions that can help a health care provider diagnose anxiety. Your health care provider may refer you to a mental health expert who specializes in anxiety. How is this treated? This condition may be treated with:  Medicines. Your health care provider will only give you medicines that have been proven safe for pregnancy and breastfeeding.  Talk therapy with a mental health professional to help change your patterns of thinking (cognitive behavioral therapy).  Mindfulness-based stress reduction.  Other relaxation therapies, such as deep breathing or guided muscle relaxation.  Support groups.  Follow these instructions at home: Lifestyle  Do not use any products that contain nicotine or tobacco, such as cigarettes and e-cigarettes. If you need help quitting, ask your health care provider.  Do not use alcohol when you are pregnant. After your baby is born, limit alcohol intake to no more than 1 drink a day. One drink equals 12 oz of beer, 5 oz of wine, or 1 oz of hard liquor.  Consider joining a support group for new mothers. Ask your health care provider for recommendations.  Take good care of yourself. Make sure you: ? Get plenty of sleep. If you are having trouble sleeping, talk with your health care provider. ? Eat a healthy diet. This includes plenty of fruits and vegetables, whole grains, and lean proteins. ? Exercise regularly, as told by your health care provider. Ask your health care provider what exercises are safe for you. General instructions  Take over-the-counter and prescription medicines only as told by your health  care provider.  Talk with your partner or family members about your feelings during pregnancy. Share any concerns or fears that you may have.  Ask for help with tasks or chores when you need it. Ask friends and family members to provide meals, watch your children, or help with cleaning.  Keep all follow-up visits as told by your health care provider. This is important. Contact a health care provider if:  You (or people close to you) notice that you have any symptoms of anxiety or depression.  You have anxiety and your symptoms get worse.  You experience side effects from medicines, such as nausea or sleep problems. Get help right away if:  You feel like hurting yourself, your baby, or someone else. If you ever feel like you may hurt yourself or others, or have thoughts about taking your own life, get help right away. You can go to your nearest emergency department or call:  Your local emergency services (911 in the [place]  A suicide crisis helpline, such as the National Suicide Prevention Lifeline at 937 046 [address]5301-(336)661-5361. This is open 24 hours a day.  Summary  Perinatal anxiety is when a woman feels excessive tension or worry during pregnancy or during the first 12 months after she gives birth.  Perinatal anxiety may include panic attacks, post-traumatic stress disorder, separation anxiety, phobias, or generalized anxiety.  Perinatal anxiety can cause physical health problems in the mother and baby if not properly managed.  This condition is treated with medicines, talk therapy, stress reduction therapies, or a combination of two or more treatments.  Talk with your partner or family members about your concerns or fears. Do not be afraid to ask for help. This information is not intended to replace advice given to you by your health care provider. Make sure you discuss any questions you have with your health care provider. Document Released: 09/01/2016 Document Revised: 09/01/2016 Document  Reviewed:  09/01/2016 Elsevier Interactive Patient Education  [employer].

## 2017-12-31 NOTE — Progress Notes (Signed)
CSW received call from MD requesting that CSW reach out to patient to identify any current stressors or indicators that symptoms of anxiety or depression were present. CSW spoke with patient regarding her past anxiety and depression, patient stated that she had anxiety and depression two years ago whenever she was pregnant in 2017. Patient reports going to the [facility] for treatment, but was never placed on any psychotropic medications to address her symptoms. Patient stated that she has not been back to the [facility] for two years. Patient stated that she has a great support system and has had a stable mood throughout pregnancy and since delivery. CSW inquired about suicidal or homicidal thoughts, patient denied them. Patient stated that she didn't desire any medications or therapist at this time. CSW counseled patient on baby blues period versus postpartum depression. CSW encouraged patient to reach out to CSW department, her OB provider, or the [facility] if any symptoms return that are overwhelming or concerning, patient stated agreement and understanding.  Edwin Dadaarol Omolola Mittman, MSW, LCSW-A Clinical Social Worker [facility] [phone]

## 2017-12-31 NOTE — Consult Note (Signed)
Called by RN that patient is complaining of back pain. Patient delivered vaginally on 6/13 and had an epidural placed for labor. Patient describes pain as aching/sore in nature, moderate intensity, improved with tylenol and ibuprofen. She states the pain began after the epidural medication wore off. She has full sensory and motor recovery, able to stand and walk without difficulty. Mild tenderness to palpation along the lumbar spine, no induration or swelling noted. Patient most likely experiencing MSK pain related to the labor process, or possibly residual soreness from epidural placement. Either way, very low likelihood of any epidural/spinal bleeding, given lack of motor or sensory deficits. Instructed patient to continue with tylenol, motrin, and ice packs for back pain. Pain should subside within a few days. Patient was instructed to follow up in ED if she develops any sensory deficits or motor weakness. Patient expressed understanding.

## 2018-02-14 ENCOUNTER — Inpatient Hospital Stay: Payer: Medicaid Other | Attending: Hematology | Admitting: Hematology

## 2018-02-14 ENCOUNTER — Inpatient Hospital Stay: Payer: Medicaid Other

## 2018-03-31 ENCOUNTER — Ambulatory Visit: Payer: Self-pay | Admitting: General Surgery

## 2018-03-31 ENCOUNTER — Observation Stay (HOSPITAL_COMMUNITY)
Admission: AD | Admit: 2018-03-31 | Discharge: 2018-04-02 | Disposition: A | Discharge status: Home / Self Care | Payer: Medicaid Other | Source: Ambulatory Visit | Attending: General Surgery | Admitting: General Surgery

## 2018-03-31 DIAGNOSIS — I10 Essential (primary) hypertension: Secondary | ICD-10-CM | POA: Insufficient documentation

## 2018-03-31 DIAGNOSIS — K611 Rectal abscess: Principal | ICD-10-CM | POA: Insufficient documentation

## 2018-03-31 DIAGNOSIS — D649 Anemia, unspecified: Secondary | ICD-10-CM | POA: Diagnosis not present

## 2018-03-31 DIAGNOSIS — F329 Major depressive disorder, single episode, unspecified: Secondary | ICD-10-CM | POA: Insufficient documentation

## 2018-03-31 DIAGNOSIS — Z79899 Other long term (current) drug therapy: Secondary | ICD-10-CM | POA: Insufficient documentation

## 2018-03-31 LAB — CBC WITH DIFFERENTIAL/PLATELET
ABS IMMATURE GRANULOCYTES: 0.1 10*3/uL (ref 0.0–0.1)
BASOS ABS: 0 10*3/uL (ref 0.0–0.1)
Basophils Relative: 0 %
EOS ABS: 0 10*3/uL (ref 0.0–0.7)
Eosinophils Relative: 0 %
HCT: 31.9 % — ABNORMAL LOW (ref 36.0–46.0)
Hemoglobin: 9.8 g/dL — ABNORMAL LOW (ref 12.0–15.0)
Immature Granulocytes: 1 %
Lymphocytes Relative: 20 %
Lymphs Abs: 2.1 10*3/uL (ref 0.7–4.0)
MCH: 25.5 pg — AB (ref 26.0–34.0)
MCHC: 30.7 g/dL (ref 30.0–36.0)
MCV: 83.1 fL (ref 78.0–100.0)
MONO ABS: 0.2 10*3/uL (ref 0.1–1.0)
Monocytes Relative: 2 %
NEUTROS ABS: 7.8 10*3/uL — AB (ref 1.7–7.7)
NEUTROS PCT: 77 %
PLATELETS: 518 10*3/uL — AB (ref 150–400)
RBC: 3.84 MIL/uL — ABNORMAL LOW (ref 3.87–5.11)
RDW: 16.6 % — AB (ref 11.5–15.5)
WBC: 10.2 10*3/uL (ref 4.0–10.5)

## 2018-03-31 LAB — BASIC METABOLIC PANEL
ANION GAP: 10 (ref 5–15)
BUN: 9 mg/dL (ref 6–20)
CO2: 23 mmol/L (ref 22–32)
CREATININE: 0.58 mg/dL (ref 0.44–1.00)
Calcium: 9 mg/dL (ref 8.9–10.3)
Chloride: 102 mmol/L (ref 98–111)
GLUCOSE: 133 mg/dL — AB (ref 70–99)
Potassium: 3.7 mmol/L (ref 3.5–5.1)
Sodium: 135 mmol/L (ref 135–145)

## 2018-03-31 MED ORDER — PIPERACILLIN-TAZOBACTAM 3.375 G IVPB 30 MIN
3.3750 g | Freq: Once | INTRAVENOUS | Status: AC
  Administered 2018-03-31: 3.375 g via INTRAVENOUS
  Filled 2018-03-31: qty 50

## 2018-03-31 MED ORDER — ONDANSETRON HCL 4 MG/2ML IJ SOLN: 4.0000 mg | Freq: Four times a day (QID) | INTRAMUSCULAR | Status: DC | PRN

## 2018-03-31 MED ORDER — KCL IN DEXTROSE-NACL 20-5-0.9 MEQ/L-%-% IV SOLN
INTRAVENOUS | Status: DC
  Administered 2018-03-31: 20:00:00 via INTRAVENOUS
  Filled 2018-03-31 (×2): qty 1000

## 2018-03-31 MED ORDER — FAMOTIDINE IN NACL 20-0.9 MG/50ML-% IV SOLN
20.0000 mg | Freq: Two times a day (BID) | INTRAVENOUS | Status: DC
  Administered 2018-03-31: 20 mg via INTRAVENOUS
  Filled 2018-03-31: qty 50

## 2018-03-31 MED ORDER — ONDANSETRON 4 MG PO TBDP: 4.0000 mg | ORAL_TABLET | Freq: Four times a day (QID) | ORAL | Status: DC | PRN

## 2018-03-31 MED ORDER — DIPHENHYDRAMINE HCL 25 MG PO CAPS
25.0000 mg | ORAL_CAPSULE | Freq: Once | ORAL | Status: AC
  Administered 2018-03-31: 25 mg via ORAL
  Filled 2018-03-31: qty 1

## 2018-03-31 MED ORDER — PIPERACILLIN-TAZOBACTAM 3.375 G IVPB
3.3750 g | Freq: Three times a day (TID) | INTRAVENOUS | Status: DC
  Administered 2018-04-01 – 2018-04-02 (×4): 3.375 g via INTRAVENOUS
  Filled 2018-03-31 (×2): qty 50

## 2018-03-31 MED ORDER — MORPHINE SULFATE (PF) 2 MG/ML IV SOLN
1.0000 mg | INTRAVENOUS | Status: DC | PRN
  Administered 2018-03-31 – 2018-04-01 (×4): 2 mg via INTRAVENOUS
  Administered 2018-04-01 (×3): 4 mg via INTRAVENOUS
  Filled 2018-03-31 (×2): qty 1
  Filled 2018-03-31: qty 2
  Filled 2018-03-31: qty 1
  Filled 2018-03-31 (×2): qty 2
  Filled 2018-03-31: qty 1

## 2018-03-31 NOTE — H&P (Signed)
Kristin Bruce Documented: 03/31/2018 2:25 PM Location: [facility] Patient #: [id number] DOB: July 29, 1995 Single / Language: [place] / Race: Black or African American Female   History of Present Illness Kristin Bruce(Kristin Hult S. Kristin Edouardoth MD; 03/31/2018 2:51 PM) The patient is a 22 year old female who presents with a perirectal abscess. We are asked to see the patient in consultation by Dr. Henreitta LeberElmira Bruce to evaluate her for a pilonidal abscess. The patient is a 22 year old black female who has been having recurrent infections near her rectum for the last for 5 years. This area will periodically drain. She has significant pain associated with it. Because of the pain she has not been eating and has lost several pounds.   Allergies (Kristin Bruce, RMA; 03/31/2018 2:26 PM) No Known Drug Allergies [03/31/2018]: Allergies Reconciled   Medication History (Kristin Bruce, RMA; 03/31/2018 2:26 PM) Naproxen (500MG  Tablet, Oral) Active. Xulane (150-35MCG/24HR Patch Weekly, Transdermal) Active. Doxycycline Hyclate (100MG  Tablet, Oral) Active. Fluconazole (150MG  Tablet, Oral) Active. oxyCODONE-Acetaminophen (5-325MG  Tablet, Oral) Active. Medications Reconciled    Review of Systems Kristin Bruce(Kristin Bruce S. Kristin Edouardoth MD; 03/31/2018 2:52 PM) General Not Present- Appetite Loss, Chills, Fatigue, Fever, Night Sweats, Weight Gain and Weight Loss. Note: All other systems negative (unless as noted in HPI & included Review of Systems) Skin Not Present- Change in Wart/Mole, Dryness, Hives, Jaundice, New Lesions, Non-Healing Wounds, Rash and Ulcer. HEENT Not Present- Earache, Hearing Loss, Hoarseness, Nose Bleed, Oral Ulcers, Ringing in the Ears, Seasonal Allergies, Sinus Pain, Sore Throat, Visual Disturbances, Wears glasses/contact lenses and Yellow Eyes. Respiratory Not Present- Bloody sputum, Chronic Cough, Difficulty Breathing, Snoring and Wheezing. Breast Not Present- Breast Mass, Breast Pain, Nipple Discharge and Skin  Changes. Cardiovascular Not Present- Chest Pain, Difficulty Breathing Lying Down, Leg Cramps, Palpitations, Rapid Heart Rate, Shortness of Breath and Swelling of Extremities. Gastrointestinal Not Present- Abdominal Pain, Bloating, Bloody Stool, Change in Bowel Habits, Chronic diarrhea, Constipation, Difficulty Swallowing, Excessive gas, Gets full quickly at meals, Hemorrhoids, Indigestion, Nausea, Rectal Pain and Vomiting. Female Genitourinary Not Present- Frequency, Nocturia, Painful Urination, Pelvic Pain and Urgency. Musculoskeletal Not Present- Back Pain, Joint Pain, Joint Stiffness, Muscle Pain, Muscle Weakness and Swelling of Extremities. Neurological Not Present- Decreased Memory, Fainting, Headaches, Numbness, Seizures, Tingling, Tremor, Trouble walking and Weakness. Psychiatric Not Present- Anxiety, Bipolar, Change in Sleep Pattern, Depression, Fearful and Frequent crying. Endocrine Not Present- Cold Intolerance, Excessive Hunger, Hair Changes, Heat Intolerance, Hot flashes and New Diabetes. Hematology Not Present- Easy Bruising, Excessive bleeding, Gland problems, HIV and Persistent Infections.  Vitals (Kristin A. Brown RMA; 03/31/2018 2:26 PM) 03/31/2018 2:26 PM Weight: 108.6 lb Height: 60in Body Surface Area: 1.44 m Body Mass Index: 21.21 kg/m  Temp.: 98.92F  Pulse: 90 (Regular)  BP: 132/74 (Sitting, Left Arm, Standard)       Physical Exam Kristin Bruce(Kristin Bruce S. Kristin Edouardoth MD; 03/31/2018 2:52 PM) General Mental Status-Alert. General Appearance-Consistent with stated age. Hydration-Well hydrated. Voice-Normal.  Head and Neck Head-normocephalic, atraumatic with no lesions or palpable masses. Trachea-midline. Thyroid Gland Characteristics - normal size and consistency.  Eye Eyeball - Bilateral-Extraocular movements intact. Sclera/Conjunctiva - Bilateral-No scleral icterus.  Chest and Lung Exam Chest and lung exam reveals -quiet, even and easy respiratory  effort with no use of accessory muscles and on auscultation, normal breath sounds, no adventitious sounds and normal vocal resonance. Inspection Chest Wall - Normal. Back - normal.  Cardiovascular Cardiovascular examination reveals -normal heart sounds, regular rate and rhythm with no murmurs and normal pedal pulses bilaterally.  Abdomen Inspection Inspection  of the abdomen reveals - No Hernias. Skin - Scar - no surgical scars. Palpation/Percussion Palpation and Percussion of the abdomen reveal - Soft, Non Tender, No Rebound tenderness, No Rigidity (guarding) and No hepatosplenomegaly. Auscultation Auscultation of the abdomen reveals - Bowel sounds normal.  Rectal Note: There is an opening in the skin in the left perirectal space that is draining a large amount of pus. There is also a second opening posteriorly near the gluteal cleft.   Neurologic Neurologic evaluation reveals -alert and oriented x 3 with no impairment of recent or remote memory. Mental Status-Normal.  Musculoskeletal Normal Exam - Left-Upper Extremity Strength Normal and Lower Extremity Strength Normal. Normal Exam - Right-Upper Extremity Strength Normal and Lower Extremity Strength Normal.  Lymphatic Head & Neck  General Head & Neck Lymphatics: Bilateral - Description - Normal. Axillary  General Axillary Region: Bilateral - Description - Normal. Tenderness - Non Tender. Femoral & Inguinal  Generalized Femoral & Inguinal Lymphatics: Bilateral - Description - Normal. Tenderness - Non Tender.    Assessment & Plan Kristin Bruce S. Kristin Edouard MD; 03/31/2018 2:50 PM) PERIRECTAL ABSCESS (K61.1) Impression: The patient appears to have a large left-sided perirectal abscess. She is exquisitely tender and draining a lot of pus. Although it is draining spontaneously and I think she needs an exam under anesthesia with incision and drainage of the perirectal abscess. I have discussed this with her in detail including the  risks and benefits of surgery as well as some of the technical aspects and she understands. I will have her directly admitted to the hospital tonight for the weekend on call surgeons to do for the weekend

## 2018-03-31 NOTE — H&P (Deleted)
  The note originally documented on this encounter has been moved the the encounter in which it belongs.  

## 2018-03-31 NOTE — Progress Notes (Signed)
Pt new direct admit Dx perirectal abscess, alert and oriented ambulatory wants to take a shower prior to IV insertion, with family at the bedside.

## 2018-03-31 NOTE — Progress Notes (Signed)
Pharmacy Antibiotic Note  Kristin Bruce is a 22 y.o. female admitted on 03/31/2018 with Peri-rectal abscess.  Pharmacy has been consulted for Zosyn dosing. Plan for I&D.  Plan: Zosyn 3.375g IV q8h (4 hour infusion). Monitor clinical progress, cultures/sensitivities, renal function, abx plan  Height: 5' (152.4 cm) Weight: 109 lb 9.1 oz (49.7 kg) IBW/kg (Calculated) : 45.5  Temp (24hrs), Avg:97.9 F (36.6 C), Min:97.9 F (36.6 C), Max:97.9 F (36.6 C)  Recent Labs  Lab 03/31/18 1859  WBC 10.2  CREATININE 0.58    Estimated Creatinine Clearance: 79.2 mL/min (by C-G formula based on SCr of 0.58 mg/dL).    No Known Allergies  Antimicrobials this admission: 9/13 Zosyn >>   Microbiology results:  Thank you for allowing us to participate in this patients care.   Kristin Bruce, PharmD Please utilize [facility] (under [facility]) for appropriate number for your unit pharmacist. 03/31/2018 7:49 PM

## 2018-04-01 ENCOUNTER — Encounter (HOSPITAL_COMMUNITY): Admission: AD | Disposition: A | Discharge status: Home / Self Care | Payer: Self-pay | Source: Ambulatory Visit

## 2018-04-01 ENCOUNTER — Observation Stay (HOSPITAL_COMMUNITY): Payer: Medicaid Other | Admitting: Anesthesiology

## 2018-04-01 ENCOUNTER — Encounter (HOSPITAL_COMMUNITY): Payer: Self-pay | Admitting: Certified Registered"

## 2018-04-01 DIAGNOSIS — Z79899 Other long term (current) drug therapy: Secondary | ICD-10-CM | POA: Diagnosis not present

## 2018-04-01 DIAGNOSIS — K611 Rectal abscess: Secondary | ICD-10-CM | POA: Diagnosis not present

## 2018-04-01 DIAGNOSIS — D649 Anemia, unspecified: Secondary | ICD-10-CM | POA: Diagnosis not present

## 2018-04-01 DIAGNOSIS — I10 Essential (primary) hypertension: Secondary | ICD-10-CM | POA: Diagnosis not present

## 2018-04-01 HISTORY — PX: INCISION AND DRAINAGE PERIRECTAL ABSCESS: SHX1804

## 2018-04-01 LAB — SURGICAL PCR SCREEN
MRSA, PCR: NEGATIVE
STAPHYLOCOCCUS AUREUS: NEGATIVE

## 2018-04-01 LAB — PREGNANCY, URINE: PREG TEST UR: NEGATIVE

## 2018-04-01 SURGERY — INCISION AND DRAINAGE, ABSCESS, PERIRECTAL
Anesthesia: General

## 2018-04-01 MED ORDER — ACETAMINOPHEN 325 MG PO TABS
650.0000 mg | ORAL_TABLET | Freq: Four times a day (QID) | ORAL | Status: DC | PRN
  Administered 2018-04-01: 650 mg via ORAL
  Filled 2018-04-01: qty 2

## 2018-04-01 MED ORDER — FENTANYL CITRATE (PF) 250 MCG/5ML IJ SOLN
INTRAMUSCULAR | Status: AC
  Filled 2018-04-01: qty 5

## 2018-04-01 MED ORDER — BUPIVACAINE-EPINEPHRINE (PF) 0.25% -1:200000 IJ SOLN
INTRAMUSCULAR | Status: AC
  Filled 2018-04-01: qty 30

## 2018-04-01 MED ORDER — PROPOFOL 10 MG/ML IV BOLUS
INTRAVENOUS | Status: AC
  Filled 2018-04-01: qty 20

## 2018-04-01 MED ORDER — MIDAZOLAM HCL 2 MG/2ML IJ SOLN
INTRAMUSCULAR | Status: AC
  Filled 2018-04-01: qty 2

## 2018-04-01 MED ORDER — PROPOFOL 10 MG/ML IV BOLUS
INTRAVENOUS | Status: DC | PRN
  Administered 2018-04-01: 40 mg via INTRAVENOUS
  Administered 2018-04-01: 130 mg via INTRAVENOUS

## 2018-04-01 MED ORDER — MEPERIDINE HCL 50 MG/ML IJ SOLN: 6.2500 mg | INTRAMUSCULAR | Status: DC | PRN

## 2018-04-01 MED ORDER — DEXAMETHASONE SODIUM PHOSPHATE 4 MG/ML IJ SOLN
INTRAMUSCULAR | Status: DC | PRN
  Administered 2018-04-01: 8 mg via INTRAVENOUS

## 2018-04-01 MED ORDER — GLYCOPYRROLATE PF 0.2 MG/ML IJ SOSY
PREFILLED_SYRINGE | INTRAMUSCULAR | Status: AC
  Filled 2018-04-01: qty 1

## 2018-04-01 MED ORDER — GLYCOPYRROLATE PF 0.2 MG/ML IJ SOSY
PREFILLED_SYRINGE | INTRAMUSCULAR | Status: DC | PRN
  Administered 2018-04-01: .1 mg via INTRAVENOUS

## 2018-04-01 MED ORDER — MORPHINE SULFATE (PF) 4 MG/ML IV SOLN
4.0000 mg | INTRAVENOUS | Status: DC | PRN
  Administered 2018-04-01 – 2018-04-02 (×2): 4 mg via INTRAVENOUS
  Filled 2018-04-01 (×2): qty 1

## 2018-04-01 MED ORDER — ONDANSETRON HCL 4 MG/2ML IJ SOLN
INTRAMUSCULAR | Status: AC
  Filled 2018-04-01: qty 2

## 2018-04-01 MED ORDER — LACTATED RINGERS IV SOLN
INTRAVENOUS | Status: DC
  Administered 2018-04-01 – 2018-04-02 (×2): via INTRAVENOUS

## 2018-04-01 MED ORDER — HYDROMORPHONE HCL 1 MG/ML IJ SOLN
INTRAMUSCULAR | Status: AC
  Administered 2018-04-01: 0.25 mg via INTRAVENOUS
  Filled 2018-04-01: qty 1

## 2018-04-01 MED ORDER — PHENYLEPHRINE 40 MCG/ML (10ML) SYRINGE FOR IV PUSH (FOR BLOOD PRESSURE SUPPORT)
PREFILLED_SYRINGE | INTRAVENOUS | Status: AC
  Filled 2018-04-01: qty 10

## 2018-04-01 MED ORDER — MEPERIDINE HCL 50 MG/ML IJ SOLN
INTRAMUSCULAR | Status: AC
  Filled 2018-04-01: qty 1

## 2018-04-01 MED ORDER — HYDROMORPHONE HCL 1 MG/ML IJ SOLN
0.2500 mg | INTRAMUSCULAR | Status: DC | PRN
  Administered 2018-04-01 (×2): 0.25 mg via INTRAVENOUS

## 2018-04-01 MED ORDER — LIDOCAINE 2% (20 MG/ML) 5 ML SYRINGE
INTRAMUSCULAR | Status: AC
  Filled 2018-04-01: qty 5

## 2018-04-01 MED ORDER — BUPIVACAINE-EPINEPHRINE 0.25% -1:200000 IJ SOLN
INTRAMUSCULAR | Status: DC | PRN
  Administered 2018-04-01: 7 mL

## 2018-04-01 MED ORDER — ONDANSETRON HCL 4 MG/2ML IJ SOLN
INTRAMUSCULAR | Status: DC | PRN
  Administered 2018-04-01: 4 mg via INTRAVENOUS

## 2018-04-01 MED ORDER — LACTATED RINGERS IV SOLN
INTRAVENOUS | Status: DC | PRN
  Administered 2018-04-01: 08:00:00 via INTRAVENOUS

## 2018-04-01 MED ORDER — OXYCODONE HCL 5 MG PO TABS
5.0000 mg | ORAL_TABLET | ORAL | Status: DC | PRN
  Administered 2018-04-01 – 2018-04-02 (×5): 10 mg via ORAL
  Filled 2018-04-01 (×5): qty 2

## 2018-04-01 MED ORDER — MIDAZOLAM HCL 5 MG/5ML IJ SOLN
INTRAMUSCULAR | Status: DC | PRN
  Administered 2018-04-01: 2 mg via INTRAVENOUS

## 2018-04-01 MED ORDER — FENTANYL CITRATE (PF) 100 MCG/2ML IJ SOLN
INTRAMUSCULAR | Status: DC | PRN
  Administered 2018-04-01 (×2): 50 ug via INTRAVENOUS

## 2018-04-01 MED ORDER — DEXAMETHASONE SODIUM PHOSPHATE 10 MG/ML IJ SOLN
INTRAMUSCULAR | Status: AC
  Filled 2018-04-01: qty 1

## 2018-04-01 MED ORDER — ONDANSETRON HCL 4 MG/2ML IJ SOLN: 4.0000 mg | Freq: Once | INTRAMUSCULAR | Status: DC | PRN

## 2018-04-01 MED ORDER — 0.9 % SODIUM CHLORIDE (POUR BTL) OPTIME
TOPICAL | Status: DC | PRN
  Administered 2018-04-01: 1000 mL

## 2018-04-01 MED ORDER — LIDOCAINE 2% (20 MG/ML) 5 ML SYRINGE
INTRAMUSCULAR | Status: DC | PRN
  Administered 2018-04-01: 100 mg via INTRAVENOUS

## 2018-04-01 SURGICAL SUPPLY — 34 items
CANISTER SUCT 3000ML PPV (MISCELLANEOUS) ×3 IMPLANT
CLEANER TIP ELECTROSURG 2X2 (MISCELLANEOUS) ×2 IMPLANT
COVER SURGICAL LIGHT HANDLE (MISCELLANEOUS) ×3 IMPLANT
DRAPE UTILITY XL STRL (DRAPES) ×6 IMPLANT
DRSG PAD ABDOMINAL 8X10 ST (GAUZE/BANDAGES/DRESSINGS) ×3 IMPLANT
ELECT REM PT RETURN 9FT ADLT (ELECTROSURGICAL) ×3
ELECTRODE REM PT RTRN 9FT ADLT (ELECTROSURGICAL) IMPLANT
GAUZE 4X4 16PLY RFD (DISPOSABLE) ×3 IMPLANT
GAUZE PACKING IODOFORM 1 (PACKING) IMPLANT
GAUZE PACKING IODOFORM 1X5 (MISCELLANEOUS) ×2 IMPLANT
GAUZE SPONGE 4X4 12PLY STRL (GAUZE/BANDAGES/DRESSINGS) ×3 IMPLANT
GAUZE SPONGE 4X4 16PLY XRAY LF (GAUZE/BANDAGES/DRESSINGS) ×2 IMPLANT
GLOVE BIO SURGEON STRL SZ8 (GLOVE) ×3 IMPLANT
GLOVE BIOGEL PI IND STRL 8 (GLOVE) ×1 IMPLANT
GLOVE BIOGEL PI INDICATOR 8 (GLOVE) ×2
GOWN STRL REUS W/ TWL LRG LVL3 (GOWN DISPOSABLE) ×2 IMPLANT
GOWN STRL REUS W/ TWL XL LVL3 (GOWN DISPOSABLE) ×1 IMPLANT
GOWN STRL REUS W/TWL LRG LVL3 (GOWN DISPOSABLE) ×6
GOWN STRL REUS W/TWL XL LVL3 (GOWN DISPOSABLE) ×3
KIT BASIN OR (CUSTOM PROCEDURE TRAY) ×3 IMPLANT
KIT TURNOVER KIT B (KITS) ×3 IMPLANT
NS IRRIG 1000ML POUR BTL (IV SOLUTION) ×3 IMPLANT
PACK LITHOTOMY IV (CUSTOM PROCEDURE TRAY) ×3 IMPLANT
PAD ABD 8X10 STRL (GAUZE/BANDAGES/DRESSINGS) ×2 IMPLANT
PAD ARMBOARD 7.5X6 YLW CONV (MISCELLANEOUS) ×6 IMPLANT
PENCIL BUTTON HOLSTER BLD 10FT (ELECTRODE) ×2 IMPLANT
SWAB COLLECTION DEVICE MRSA (MISCELLANEOUS) ×3 IMPLANT
SWAB CULTURE ESWAB REG 1ML (MISCELLANEOUS) ×3 IMPLANT
TOWEL OR 17X24 6PK STRL BLUE (TOWEL DISPOSABLE) ×3 IMPLANT
TOWEL OR 17X26 10 PK STRL BLUE (TOWEL DISPOSABLE) ×3 IMPLANT
TUBE CONNECTING 12'X1/4 (SUCTIONS) ×1
TUBE CONNECTING 12X1/4 (SUCTIONS) ×2 IMPLANT
UNDERPAD 30X30 (UNDERPADS AND DIAPERS) ×3 IMPLANT
YANKAUER SUCT BULB TIP NO VENT (SUCTIONS) ×3 IMPLANT

## 2018-04-01 NOTE — Anesthesia Preprocedure Evaluation (Signed)
Anesthesia Evaluation  Patient identified by MRN, date of birth, ID band Patient awake    Reviewed: Allergy & Precautions, NPO status , Patient's Chart, lab work & pertinent test results  Airway Mallampati: I  TM Distance: >3 FB Neck ROM: Full    Dental   Pulmonary    Pulmonary exam normal        Cardiovascular hypertension, Pt. on medications Normal cardiovascular exam     Neuro/Psych Depression    GI/Hepatic   Endo/Other    Renal/GU      Musculoskeletal   Abdominal   Peds  Hematology   Anesthesia Other Findings   Reproductive/Obstetrics                             Anesthesia Physical Anesthesia Plan  ASA: II  Anesthesia Plan: General   Post-op Pain Management:    Induction: Intravenous  PONV Risk Score and Plan: 3 and Ondansetron, Midazolam and Treatment may vary due to age or medical condition  Airway Management Planned: Oral ETT  Additional Equipment:   Intra-op Plan:   Post-operative Plan: Extubation in OR  Informed Consent: I have reviewed the patients History and Physical, chart, labs and discussed the procedure including the risks, benefits and alternatives for the proposed anesthesia with the patient or authorized representative who has indicated his/her understanding and acceptance.     Plan Discussed with: CRNA and Surgeon  Anesthesia Plan Comments:         Anesthesia Quick Evaluation  

## 2018-04-01 NOTE — Progress Notes (Signed)
Day of Surgery   Subjective/Chief Complaint: Perirectal pain   Objective: Vital signs in last 24 hours: Temp:  [97.8 F (36.6 C)-98.3 F (36.8 C)] 97.8 F (36.6 C) (09/14 0732) Pulse Rate:  [64-80] 64 (09/14 0732) Resp:  [14-16] 16 (09/14 0732) BP: (98-112)/(66-79) 110/69 (09/14 0732) SpO2:  [100 %] 100 % (09/14 0732) Weight:  [49.7 kg] 49.7 kg (09/13 1833) Last BM Date: 03/31/18  Intake/Output from previous day: 09/13 0701 - 09/14 0700 In: 489.8 [I.V.:439.8; IV Piggyback:50] Out: -  Intake/Output this shift: No intake/output data recorded.  General appearance: alert and cooperative Resp: clear to auscultation bilaterally Cardio: regular rate and rhythm GI: soft, non-tender; bowel sounds normal; no masses,  no organomegaly perirectal induration and drainage L side and posterior  Lab Results:  Recent Labs    03/31/18 1859  WBC 10.2  HGB 9.8*  HCT 31.9*  PLT 518*   BMET Recent Labs    03/31/18 1859  NA 135  K 3.7  CL 102  CO2 23  GLUCOSE 133*  BUN 9  CREATININE 0.58  CALCIUM 9.0   PT/INR No results for input(s): LABPROT, INR in the last 72 hours. ABG No results for input(s): PHART, HCO3 in the last 72 hours.  Invalid input(s): PCO2, PO2  Studies/Results: No results found.  Anti-infectives: Anti-infectives (From admission, onward)   Start     Dose/Rate Route Frequency Ordered Stop   04/01/18 0200  piperacillin-tazobactam (ZOSYN) IVPB 3.375 g     3.375 g 12.5 mL/hr over 240 Minutes Intravenous Every 8 hours 03/31/18 1956     03/31/18 1900  piperacillin-tazobactam (ZOSYN) IVPB 3.375 g     3.375 g 100 mL/hr over 30 Minutes Intravenous  Once 03/31/18 1849 04/01/18 0251      Assessment/Plan: Perirectal abscess - to OR for incision and drainage. Procedure, risks, benefits, and expected post-op course discussed and she agrees.  ID - Zosyn  Chronic anemia  LOS: 1 day    Liz MaladyBurke E Ginia Rudell 04/01/2018

## 2018-04-01 NOTE — Op Note (Signed)
04/01/2018  9:05 AM  PATIENT:  Kristin OatsKennita L Seelbach  22 y.o. female  PRE-OPERATIVE DIAGNOSIS:  PERIRECTAL ABSCESS  POST-OPERATIVE DIAGNOSIS:  PERIRECTAL ABSCESS  PROCEDURE:  Procedure(s): INCISION AND DRAINAGE COMPLEX PERIRECTAL ABSCESS  SURGEON:  Surgeon(s): Violeta Gelinashompson, Cynia Abruzzo, MD  ASSISTANTS: none   ANESTHESIA:   local and general  EBL:  No intake/output data recorded.  BLOOD ADMINISTERED:none  DRAINS: none   SPECIMEN:  No Specimen  DISPOSITION OF SPECIMEN:  N/A  COUNTS:  YES  DICTATION: .Dragon Dictation Findings: Perirectal abscess from left side extending back to posterior midline with large cavity.  Chronic granulation tissue at left side drainage location.  Procedure in detail: [name] is brought for incision and drainage of perirectal abscess.  Informed consent was obtained.  She is on intravenous antibiotics.  She was brought to the operating room and general anesthesia was administered by the anesthesia staff.  She was placed in lithotomy position and her perirectal area was prepped and draped in sterile fashion.  We did a timeout procedure.  I injected local around her chronic drainage site on the left side of her perirectal area and then posteriorly where the second drainage site was along the midline.  Examination under anesthesia revealed no clear fistula.  I then excised the chronic granulation tissue around the left draining site using cautery.  I entered a chronic abscess cavity extending posteriorly to the other drain site and opened that connection.  I sent cultures from the purulent fluid.  I then made an elliptical incision excising the chronic drainage site on the posterior midline.  The abscess tracked between these 2 areas and then more posteriorly as well.  The whole area was cleaned out thoroughly.  Hemostasis was obtained with cautery and then each wound was packed with 1 inch iodoform gauze into the confluent area.  The area was covered with bulky sterile  gauze.  All counts were correct.  She tolerated the procedure without apparent complication and was taken recovery in stable condition. PATIENT DISPOSITION:  PACU - hemodynamically stable.   Delay start of Pharmacological VTE agent (>24hrs) due to surgical blood loss or risk of bleeding:  no  Violeta GelinasBurke Cici Rodriges, MD, MPH, FACS Pager: [phone]  9/14/20199:05 AM

## 2018-04-01 NOTE — Transfer of Care (Signed)
Immediate Anesthesia Transfer of Care Note  Patient: Kristin Bruce  Procedure(s) Performed: IRRIGATION AND DEBRIDEMENT PERIRECTAL ABSCESS (N/A )  Patient Location: PACU  Anesthesia Type:General  Level of Consciousness: drowsy and patient cooperative  Airway & Oxygen Therapy: Patient Spontanous Breathing and Patient connected to face mask oxygen  Post-op Assessment: Report given to RN and Post -op Vital signs reviewed and stable  Post vital signs: Reviewed and stable  Last Vitals:  Vitals Value Taken Time  BP 121/78 04/01/2018  9:15 AM  Temp    Pulse 108 04/01/2018  9:17 AM  Resp 17 04/01/2018  9:17 AM  SpO2 97 % 04/01/2018  9:17 AM  Vitals shown include unvalidated device data.  Last Pain:  Vitals:   04/01/18 0732  TempSrc: Oral  PainSc:          Complications: No apparent anesthesia complications

## 2018-04-01 NOTE — Anesthesia Procedure Notes (Signed)
Procedure Name: LMA Insertion Date/Time: 04/01/2018 8:37 AM Performed by: Julian ReilWelty, Dayvion Sans F, CRNA Pre-anesthesia Checklist: Patient identified, Emergency Drugs available, Suction available and Patient being monitored Patient Re-evaluated:Patient Re-evaluated prior to induction Oxygen Delivery Method: Circle system utilized Preoxygenation: Pre-oxygenation with 100% oxygen Induction Type: IV induction Ventilation: Mask ventilation without difficulty LMA: LMA inserted LMA Size: 4.0 Tube type: Oral Number of attempts: 1 Placement Confirmation: positive ETCO2 Tube secured with: Tape Dental Injury: Teeth and Oropharynx as per pre-operative assessment

## 2018-04-01 NOTE — Anesthesia Postprocedure Evaluation (Signed)
Anesthesia Post Note  Patient: Kristin Bruce  Procedure(s) Performed: IRRIGATION AND DEBRIDEMENT PERIRECTAL ABSCESS (N/A )     Patient location during evaluation: PACU Anesthesia Type: General Level of consciousness: awake and alert Pain management: pain level controlled Vital Signs Assessment: post-procedure vital signs reviewed and stable Respiratory status: spontaneous breathing, nonlabored ventilation, respiratory function stable and patient connected to nasal cannula oxygen Cardiovascular status: blood pressure returned to baseline and stable Postop Assessment: no apparent nausea or vomiting Anesthetic complications: no    Last Vitals:  Vitals:   04/01/18 1006 04/01/18 1024  BP:  106/77  Pulse: 65 64  Resp: 14   Temp: 36.9 C 36.6 C  SpO2: 100% 99%    Last Pain:  Vitals:   04/01/18 1024  TempSrc: Oral  PainSc:                  [name] DAVID

## 2018-04-02 ENCOUNTER — Encounter (HOSPITAL_COMMUNITY): Payer: Self-pay | Admitting: General Surgery

## 2018-04-02 DIAGNOSIS — K611 Rectal abscess: Secondary | ICD-10-CM | POA: Diagnosis not present

## 2018-04-02 MED ORDER — OXYCODONE HCL 5 MG PO TABS: 5.0000 mg | ORAL_TABLET | Freq: Four times a day (QID) | ORAL | Status: DC | PRN

## 2018-04-02 MED ORDER — ACETAMINOPHEN 500 MG PO TABS: ORAL_TABLET | ORAL | 0 refills | Status: DC

## 2018-04-02 MED ORDER — IBUPROFEN 200 MG PO TABS: ORAL_TABLET | ORAL | Status: DC

## 2018-04-02 MED ORDER — OXYCODONE HCL 5 MG PO TABS: 5.0000 mg | ORAL_TABLET | Freq: Four times a day (QID) | ORAL | 0 refills | Status: DC | PRN

## 2018-04-02 MED ORDER — IBUPROFEN 600 MG PO TABS: 600.0000 mg | ORAL_TABLET | Freq: Four times a day (QID) | ORAL | Status: DC | PRN

## 2018-04-02 MED ORDER — SACCHAROMYCES BOULARDII 250 MG PO CAPS
250.0000 mg | ORAL_CAPSULE | Freq: Two times a day (BID) | ORAL | Status: DC
  Administered 2018-04-02: 250 mg via ORAL
  Filled 2018-04-02: qty 1

## 2018-04-02 MED ORDER — SACCHAROMYCES BOULARDII 250 MG PO CAPS: ORAL_CAPSULE | ORAL | Status: DC

## 2018-04-02 MED ORDER — ACETAMINOPHEN 500 MG PO TABS: 1000.0000 mg | ORAL_TABLET | Freq: Four times a day (QID) | ORAL | Status: DC | PRN

## 2018-04-02 MED ORDER — AMOXICILLIN-POT CLAVULANATE 875-125 MG PO TABS
1.0000 | ORAL_TABLET | Freq: Two times a day (BID) | ORAL | Status: DC
  Administered 2018-04-02: 1 via ORAL
  Filled 2018-04-02: qty 1

## 2018-04-02 MED ORDER — AMOXICILLIN-POT CLAVULANATE 875-125 MG PO TABS: 1.0000 | ORAL_TABLET | Freq: Two times a day (BID) | ORAL | 0 refills | Status: AC

## 2018-04-02 NOTE — Progress Notes (Signed)
1 Day Post-Op    CC: Perirectal abscess  Subjective: Pt is still having a lot of pain.  She never let us pull out the packing completely.  Plan is for her to shower after pain medicine, and pulled her remaining packing.  Objective: Vital signs in last 24 hours: Temp:  [97.7 F (36.5 C)-98.4 F (36.9 C)] 97.7 F (36.5 C) (09/15 0551) Pulse Rate:  [61-114] 64 (09/15 0551) Resp:  [10-31] 16 (09/15 0551) BP: (103-121)/(73-84) 107/73 (09/15 0551) SpO2:  [95 %-100 %] 100 % (09/15 0551) Last BM Date: 03/31/18 200 p.o. 1700 IV 600 urine Afebrile vital signs are stable No labs Intake/Output from previous day: 09/14 0701 - 09/15 0700 In: 2000 [P.O.:200; I.V.:1700; IV Piggyback:100] Out: 600 [Urine:600] Intake/Output this shift: No intake/output data recorded.  General appearance: alert, cooperative and no distress Skin: Skin color, texture, turgor normal. No rashes or lesions or We removed a portion of the packing in each site.  She is going to take a shower and remove the rest of it after she has been in the shower.  Lab Results:  Recent Labs    03/31/18 1859  WBC 10.2  HGB 9.8*  HCT 31.9*  PLT 518*    BMET Recent Labs    03/31/18 1859  NA 135  K 3.7  CL 102  CO2 23  GLUCOSE 133*  BUN 9  CREATININE 0.58  CALCIUM 9.0   PT/INR No results for input(s): LABPROT, INR in the last 72 hours.  No results for input(s): AST, ALT, ALKPHOS, BILITOT, PROT, ALBUMIN in the last 168 hours.  Prior to Admission medications   Medication Sig Start Date End Date Taking? Authorizing Provider  doxycycline (VIBRA-TABS) 100 MG tablet Take 100 mg by mouth 2 (two) times daily. 03/22/18  Yes [provider]  ibuprofen (ADVIL,MOTRIN) 600 MG tablet Take 1 tablet (600 mg total) by mouth every 6 (six) hours as needed for moderate pain or cramping. 12/31/17  Yes Janeece RiggersGreer, Ellis K, CNM  naproxen (NAPROSYN) 500 MG tablet Take 500 mg by mouth 2 (two) times daily as needed.   Yes [provider]  oxyCODONE-acetaminophen (PERCOCET/ROXICET) 5-325 MG tablet Take 1 tablet by mouth as needed. 03/22/18  Yes [provider]  Burr MedicoXULANE 150-35 MCG/24HR transdermal patch Apply 1 patch topically once a week. 03/09/18  Yes [provider]    Lipase     Component Value Date/Time   LIPASE 21 01/07/2016 2252     Medications:   Assessment/Plan  Perirectal abscess I&D of perirectal abscess 04/01/2018, Dr. Violeta GelinasBurke Thompson  FEN: IV fluids/regular diet ID: Laqueta JeanZosyn 9/13 =>> day 3 DVT: SCDs Follow-up [facility]  Plan: We are going to discharge her home.  She will follow-up in the [facility] clinic in 2 to 3 weeks.  She is instructed to remove the packing, she can shower or do a sitz bath 2-3 times per day.  She was instructed to clean the sites with just plain soap and water.  Especially after bowel movement and soiling the site.  We recommended she use plain Tylenol and ibuprofen for pain.  We will also send her home with some oxycodone.  Our office will call with follow-up. I have personally reviewed the patients medication history on the [place] controlled substance database.   LOS: 1 day    Nuh Lipton 04/02/2018 [phone]

## 2018-04-02 NOTE — Plan of Care (Signed)

## 2018-04-02 NOTE — Discharge Instructions (Signed)
Perirectal Abscess  You had a perirectal abscess drained.  You need to work to keep the sites clean and dry.  You can shower or take a sitz bath 2-3 times a day especially after soiling with a bowel movement.  You can alternate Tylenol, ibuprofen, and as a last resort oxycodone for pain.  We will not refill the oxycodone. Our office should set you up with a follow-up appointment in 2 to 3 weeks.  Resume your home activities as tolerated.  If you have issues with constipation you can follow with the recommendations below.   An abscess is an infected area that contains a collection of pus. A perirectal abscess is an abscess that is near the opening of the anus or around the rectum. A perirectal abscess can cause a lot of pain, especially during bowel movements. What are the causes? This condition is almost always caused by an infection that starts in an anal gland. What increases the risk? This condition is more likely to develop in:  People with diabetes or inflammatory bowel disease.  People whose body defense system (immune system) is weak.  People who have anal sex.  People who have a sexually transmitted disease (STD).  People who have certain kinds of cancers, such as rectal carcinoma, leukemia, or lymphoma.  What are the signs or symptoms? The main symptom of this condition is pain. The pain may be a throbbing pain that gets worse during bowel movements. Other symptoms include:  Fever.  Swelling.  Redness.  Bleeding.  Constipation.  How is this diagnosed? The condition is diagnosed with a physical exam. If the abscess is not visible, a health care provider may need to place a finger inside the rectum to find the abscess. Sometimes, imaging tests are done to determine the size and location of the abscess. These tests may include:  An ultrasound.  An MRI.  A CT scan.  How is this treated? This condition is usually treated with incision and drainage surgery. Incision  and drainage surgery involves making an incision over the abscess to drain the pus. Treatment may also involve antibiotic medicine, pain medicine, stool softeners, or laxatives. Follow these instructions at home:  Take medicines only as directed by your health care provider.  If you were prescribed an antibiotic, finish all of it even if you start to feel better.  To relieve pain, try sitting: ? In a warm, shallow bath (sitz bath). ? On a heating pad with the setting on low. ? On an inflatable donut-shaped cushion.  Follow any diet instructions as directed by your health care provider.  Keep all follow-up visits as directed by your health care provider. This is important. Contact a health care provider if:  Your abscess is bleeding.  You have pain, swelling, or redness that is getting worse.  You are constipated.  You feel ill.  You have muscle aches or chills.  You have a fever.  Your symptoms return after the abscess has healed. This information is not intended to replace advice given to you by your health care provider. Make sure you discuss any questions you have with your health care provider. Document Released: 07/02/2000 Document Revised: 12/11/2015 Document Reviewed: 05/15/2014 Elsevier Interactive Patient Education  2018 [employer]   How to Take a [employer] Industries[employer]itz Bath A sitz bath is a warm water bath that is taken while you are sitting down. The water should only come up to your hips and should cover your buttocks. Your health care provider  may recommend a sitz bath to help you:  Clean the lower part of your body, including your genital area.  With itching.  With pain.  With sore muscles or muscles that tighten or spasm.  How to take a sitz bath Take 3-4 sitz baths per day or as told by your health care provider. 1. Partially fill a bathtub with warm water. You will only need the water to be deep enough to cover your hips and buttocks when you are sitting in  it. 2. If your health care provider told you to put medicine in the water, follow the directions exactly. 3. Sit in the water and open the tub drain a little. 4. Turn on the warm water again to keep the tub at the correct level. Keep the water running constantly. 5. Soak in the water for 15-20 minutes or as told by your health care provider. 6. After the sitz bath, pat the affected area dry first. Do not rub it. 7. Be careful when you stand up after the sitz bath because you may feel dizzy.  Contact a health care provider if:  Your symptoms get worse. Do not continue with sitz baths if your symptoms get worse.  You have new symptoms. Do not continue with sitz baths until you talk with your health care provider. This information is not intended to replace advice given to you by your health care provider. Make sure you discuss any questions you have with your health care provider. Document Released: 03/27/2004 Document Revised: 12/03/2015 Document Reviewed: 07/03/2014 Elsevier Interactive Patient Education  2018 [employer] Inc.  GETTING TO GOOD BOWEL HEALTH. Irregular bowel habits such as constipation and diarrhea can lead to many problems over time.  Having one soft bowel movement a day is the most important way to prevent further problems.  The anorectal canal is designed to handle stretching and feces to safely manage our ability to get rid of solid waste (feces, poop, stool) out of our body.  BUT, hard constipated stools can act like ripping concrete bricks and diarrhea can be a burning fire to this very sensitive area of our body, causing inflamed hemorrhoids, anal fissures, increasing risk is perirectal abscesses, abdominal pain/bloating, an making irritable bowel worse.     The goal: ONE SOFT BOWEL MOVEMENT A DAY!  To have soft, regular bowel movements:   Drink at least 8 tall glasses of water a day.    Take plenty of fiber.  Fiber is the undigested part of plant food that passes into the  colon, acting s natures broom to encourage bowel motility and movement.  Fiber can absorb and hold large amounts of water. This results in a larger, bulkier stool, which is soft and easier to pass. Work gradually over several weeks up to 6 servings a day of fiber (25g a day even more if needed) in the form of: o Vegetables -- Root (potatoes, carrots, turnips), leafy green (lettuce, salad greens, celery, spinach), or cooked high residue (cabbage, broccoli, etc) o Fruit -- Fresh (unpeeled skin & pulp), Dried (prunes, apricots, cherries, etc ),  or stewed ( applesauce)  o Whole grain breads, pasta, etc (whole wheat)  o Bran cereals   Bulking Agents -- This type of water-retaining fiber generally is easily obtained each day by one of the following:  o Psyllium bran -- The psyllium plant is remarkable because its ground seeds can retain so much water. This product is available as Metamucil, Konsyl, Effersyllium, Per Diem Fiber, or the  less expensive generic preparation in drug and health food stores. Although labeled a laxative, it really is not a laxative.  o Methylcellulose -- This is another fiber derived from wood which also retains water. It is available as Citrucel. o Polyethylene Glycol - and artificial fiber commonly called Miralax or Glycolax.  It is helpful for people with gassy or bloated feelings with regular fiber o Flax Seed - a less gassy fiber than psyllium  No reading or other relaxing activity while on the toilet. If bowel movements take longer than 5 minutes, you are too constipated  AVOID CONSTIPATION.  High fiber and water intake usually takes care of this.  Sometimes a laxative is needed to stimulate more frequent bowel movements, but   Laxatives are not a good long-term solution as it can wear the colon out. o Osmotics (Milk of Magnesia, Fleets phosphosoda, Magnesium citrate, MiraLax, GoLytely) are safer than  o Stimulants (Senokot, Castor Oil, Dulcolax, Ex Lax)    o Do not  take laxatives for more than 7days in a row.   IF SEVERELY CONSTIPATED, try a Bowel Retraining Program: o Do not use laxatives.  o Eat a diet high in roughage, such as bran cereals and leafy vegetables.  o Drink six (6) ounces of prune or apricot juice each morning.  o Eat two (2) large servings of stewed fruit each day.  o Take one (1) heaping tablespoon of a psyllium-based bulking agent twice a day. Use sugar-free sweetener when possible to avoid excessive calories.  o Eat a normal breakfast.  o Set aside 15 minutes after breakfast to sit on the toilet, but do not strain to have a bowel movement.  o If you do not have a bowel movement by the third day, use an enema and repeat the above steps.   Controlling diarrhea o Switch to liquids and simpler foods for a few days to avoid stressing your intestines further. o Avoid dairy products (especially milk & ice cream) for a short time.  The intestines often can lose the ability to digest lactose when stressed. o Avoid foods that cause gassiness or bloating.  Typical foods include beans and other legumes, cabbage, broccoli, and dairy foods.  Every person has some sensitivity to other foods, so listen to our body and avoid those foods that trigger problems for you. o Adding fiber (Citrucel, Metamucil, psyllium, Miralax) gradually can help thicken stools by absorbing excess fluid and retrain the intestines to act more normally.  Slowly increase the dose over a few weeks.  Too much fiber too soon can backfire and cause cramping & bloating. o Probiotics (such as active yogurt, Align, etc) may help repopulate the intestines and colon with normal bacteria and calm down a sensitive digestive tract.  Most studies show it to be of mild help, though, and such products can be costly. o Medicines: - Bismuth subsalicylate (ex. Kayopectate, Pepto Bismol) every 30 minutes for up to 6 doses can help control diarrhea.  Avoid if pregnant. - Loperamide (Immodium) can  slow down diarrhea.  Start with two tablets (4mg  total) first and then try one tablet every 6 hours.  Avoid if you are having fevers or severe pain.  If you are not better or start feeling worse, stop all medicines and call your doctor for advice o Call your doctor if you are getting worse or not better.  Sometimes further testing (cultures, endoscopy, X-ray studies, bloodwork, etc) may be needed to help diagnose and treat the cause of  the diarrhea.

## 2018-04-02 NOTE — Discharge Summary (Signed)
Physician Discharge Summary  Patient ID: Kristin Bruce MRN: [id number] DOB/AGE: 1996-03-26 22 y.o.  Admit date: 03/31/2018 Discharge date: 04/02/2018  Admission Diagnoses:  Perirectal abscess  Discharge Diagnoses:  Perirectal abscess Active Problems:   Perirectal abscess   PROCEDURES: Incision and drainage of perirectal abscess, 04/01/2018, Dr. Gloris Ham Course: Patient is a 22 year old female presented to the office with a perirectal abscess.  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  Needs her periodically drained.  She has a significant amount of pain associated with this.  She was admitted directly from our office for further treatment time.  She was started on IV Zosyn, and prepared for surgery the following day.  She was taken the operating room on 04/01/2018.  She underwent I&D as described above.  She tolerated this well.  Continue to have good deal of pain first postoperative day but she was mobilized.  We had her remove the dressing herself since she did not want [place] to do it.  This was done after showering.  We stressed showering, sitz baths, and keeping the area clean and dry.  It was Dr. Eliberto Ivory opinion she can go home without packing.  We sent her home on a week of antibiotics and gave her probiotics go with this.  Pain control was with plain Tylenol ibuprofen and some oxycodone.  Our office will call and arrange follow-up in the next 2 weeks.  Condition on discharge: Improving  CBC Latest Ref Rng & Units 03/31/2018 12/30/2017 12/29/2017  WBC 4.0 - 10.5 K/uL 10.2 10.6(H) 9.4  Hemoglobin 12.0 - 15.0 g/dL 4.0(J) 8.1(X) 9.1(Y)  Hematocrit 36.0 - 46.0 % 31.9(L) 28.2(L) 31.4(L)  Platelets 150 - 400 K/uL 518(H) 188 226   CMP Latest Ref Rng & Units 03/31/2018 12/29/2017 07/13/2017  Glucose 70 - 99 mg/dL 782(N) 67 69  BUN 6 - 20 mg/dL 9 12 16   Creatinine 0.44 - 1.00 mg/dL 5.62 1.30 8.65  Sodium 135 - 145 mmol/L 135 135 134(L)  Potassium  3.5 - 5.1 mmol/L 3.7 3.9 3.5  Chloride 98 - 111 mmol/L 102 103 105  CO2 22 - 32 mmol/L 23 21(L) 18(L)  Calcium 8.9 - 10.3 mg/dL 9.0 7.8(I) 6.9(G)  Total Protein 6.5 - 8.1 g/dL - 7.3 7.3  Total Bilirubin 0.3 - 1.2 mg/dL - 1.6(H) 2.0(H)  Alkaline Phos 38 - 126 U/L - 187(H) 52  AST 15 - 41 U/L - 30 16  ALT 14 - 54 U/L - 30 12(L)    Disposition: Discharge disposition: 01-Home or Self Care        Allergies as of 04/02/2018   No Known Allergies     Medication List    STOP taking these medications   doxycycline 100 MG tablet Commonly known as:  VIBRA-TABS   naproxen 500 MG tablet Commonly known as:  NAPROSYN   oxyCODONE-acetaminophen 5-325 MG tablet Commonly known as:  PERCOCET/ROXICET     TAKE these medications   acetaminophen 500 MG tablet Commonly known as:  TYLENOL You can take 1000 mg every 6 hours as needed for pain.  Use this is your primary pain control.  You can alternate this with the ibuprofen.  If he is continued to have pain you can use the oxycodone as a last resort. DO NOT TAKE MORE THAN 4000 MG OF TYLENOL PER DAY.  IT CAN HARM YOUR LIVER.   amoxicillin-clavulanate 875-125 MG tablet Commonly known as:  AUGMENTIN Take 1 tablet by  mouth every 12 (twelve) hours for 7 days.   ibuprofen 200 MG tablet Commonly known as:  ADVIL,MOTRIN You can take 2 to 3 tablets every 6 hours as needed for pain.  You can alternate this with plain Tylenol, or the oxycodone.  You can buy this over-the-counter without a prescription.  Do not exceed this amount it can cause kidney injury and ulcers. What changed:    medication strength  how much to take  how to take this  when to take this  reasons to take this  additional instructions   oxyCODONE 5 MG immediate release tablet Commonly known as:  Oxy IR/ROXICODONE Take 1 tablet (5 mg total) by mouth every 6 (six) hours as needed (Pain not relieved with plain Tylenol and ibuprofen).   saccharomyces boulardii 250 MG  capsule Commonly known as:  FLORASTOR This is a probiotic to use along with yogurt to help replace your normal bowel flora.  You can buy this over-the-counter at any drugstore.  Take for at least 1 week after you completed your antibiotics.  You need a prescription for this.   XULANE 150-35 MCG/24HR transdermal patch Generic drug:  norelgestromin-ethinyl estradiol Apply 1 patch topically once a week.      Follow-up Information    [facility] [place] [facility].   Specialty:  General Surgery Why:  Our office should call you next week with a follow-up in our doctor the week clinic in 2 to 3 weeks. Contact information: [address] [place] [id number] [phone]        Contact your primary care and let them know about your hospitalization and procedure. Follow up.           SignedSherrie George: Karima Carrell 04/02/2018, 12:28 PM

## 2018-04-04 LAB — AEROBIC/ANAEROBIC CULTURE (SURGICAL/DEEP WOUND)

## 2018-04-04 LAB — AEROBIC/ANAEROBIC CULTURE W GRAM STAIN (SURGICAL/DEEP WOUND): Culture: NORMAL

## 2018-05-12 ENCOUNTER — Other Ambulatory Visit: Payer: Self-pay

## 2018-05-12 ENCOUNTER — Observation Stay (HOSPITAL_COMMUNITY)
Admission: EM | Admit: 2018-05-12 | Discharge: 2018-05-15 | Disposition: A | Discharge status: Home / Self Care | Payer: Medicaid Other | Attending: General Surgery | Admitting: General Surgery

## 2018-05-12 ENCOUNTER — Encounter (HOSPITAL_COMMUNITY): Payer: Self-pay | Admitting: Emergency Medicine

## 2018-05-12 DIAGNOSIS — Z9889 Other specified postprocedural states: Secondary | ICD-10-CM | POA: Insufficient documentation

## 2018-05-12 DIAGNOSIS — L0501 Pilonidal cyst with abscess: Secondary | ICD-10-CM | POA: Diagnosis not present

## 2018-05-12 DIAGNOSIS — D649 Anemia, unspecified: Secondary | ICD-10-CM | POA: Diagnosis not present

## 2018-05-12 DIAGNOSIS — I10 Essential (primary) hypertension: Secondary | ICD-10-CM | POA: Insufficient documentation

## 2018-05-12 DIAGNOSIS — Z793 Long term (current) use of hormonal contraceptives: Secondary | ICD-10-CM | POA: Diagnosis not present

## 2018-05-12 DIAGNOSIS — L0231 Cutaneous abscess of buttock: Secondary | ICD-10-CM

## 2018-05-12 LAB — I-STAT BETA HCG BLOOD, ED (MC, WL, AP ONLY): I-stat hCG, quantitative: <5 m[IU]/mL (ref <5)

## 2018-05-12 LAB — COMPREHENSIVE METABOLIC PANEL
ALBUMIN: 3.5 g/dL (ref 3.5–5.0)
ALT: 14 U/L (ref 0–44)
AST: 17 U/L (ref 15–41)
Alkaline Phosphatase: 77 U/L (ref 38–126)
Anion gap: 12 (ref 5–15)
BUN: 9 mg/dL (ref 6–20)
CHLORIDE: 101 mmol/L (ref 98–111)
CO2: 23 mmol/L (ref 22–32)
CREATININE: 0.7 mg/dL (ref 0.44–1.00)
Calcium: 9.4 mg/dL (ref 8.9–10.3)
GFR calc Af Amer: >60 mL/min (ref >60)
GFR calc non Af Amer: >60 mL/min (ref >60)
Glucose, Bld: 95 mg/dL (ref 70–99)
Potassium: 3.9 mmol/L (ref 3.5–5.1)
SODIUM: 136 mmol/L (ref 135–145)
Total Bilirubin: 2.7 mg/dL — ABNORMAL HIGH (ref 0.3–1.2)
Total Protein: 7.9 g/dL (ref 6.5–8.1)

## 2018-05-12 LAB — URINALYSIS, ROUTINE W REFLEX MICROSCOPIC
Glucose, UA: NEGATIVE mg/dL
Hgb urine dipstick: NEGATIVE
KETONES UR: 20 mg/dL — AB
LEUKOCYTES UA: NEGATIVE
Nitrite: NEGATIVE
Protein, ur: 30 mg/dL — AB
SPECIFIC GRAVITY, URINE: 1.029 (ref 1.005–1.030)
pH: 6 (ref 5.0–8.0)

## 2018-05-12 LAB — CBC
HCT: 35.9 % — ABNORMAL LOW (ref 36.0–46.0)
Hemoglobin: 11 g/dL — ABNORMAL LOW (ref 12.0–15.0)
MCH: 26.5 pg (ref 26.0–34.0)
MCHC: 30.6 g/dL (ref 30.0–36.0)
MCV: 86.5 fL (ref 80.0–100.0)
NRBC: 0 % (ref 0.0–0.2)
Platelets: 391 10*3/uL (ref 150–400)
RBC: 4.15 MIL/uL (ref 3.87–5.11)
RDW: 14.4 % (ref 11.5–15.5)
WBC: 12.4 10*3/uL — ABNORMAL HIGH (ref 4.0–10.5)

## 2018-05-12 LAB — LIPASE, BLOOD: LIPASE: 25 U/L (ref 11–51)

## 2018-05-12 MED ORDER — ONDANSETRON 4 MG PO TBDP
4.0000 mg | ORAL_TABLET | Freq: Once | ORAL | Status: AC | PRN
  Administered 2018-05-12: 4 mg via ORAL
  Filled 2018-05-12: qty 1

## 2018-05-12 NOTE — ED Triage Notes (Signed)
Pt reports rectal abscess ongoing x1 month, reports she's been assessed and consulted for possible surgery. Has been taking doxycyline since Monday and started vomiting in the last two days, has not been able to keep it down.

## 2018-05-12 NOTE — ED Notes (Signed)
Results reviewed, no changes in acuity at this time 

## 2018-05-13 ENCOUNTER — Emergency Department (HOSPITAL_COMMUNITY): Payer: Medicaid Other

## 2018-05-13 ENCOUNTER — Emergency Department (HOSPITAL_COMMUNITY): Payer: Medicaid Other | Admitting: Certified Registered"

## 2018-05-13 ENCOUNTER — Other Ambulatory Visit: Payer: Self-pay

## 2018-05-13 ENCOUNTER — Encounter (HOSPITAL_COMMUNITY)
Admission: EM | Disposition: A | Discharge status: Home / Self Care | Payer: Self-pay | Source: Home / Self Care | Attending: Emergency Medicine

## 2018-05-13 ENCOUNTER — Encounter (HOSPITAL_COMMUNITY): Payer: Self-pay | Admitting: Certified Registered"

## 2018-05-13 DIAGNOSIS — Z9889 Other specified postprocedural states: Secondary | ICD-10-CM | POA: Diagnosis not present

## 2018-05-13 DIAGNOSIS — D649 Anemia, unspecified: Secondary | ICD-10-CM | POA: Diagnosis not present

## 2018-05-13 DIAGNOSIS — L0501 Pilonidal cyst with abscess: Secondary | ICD-10-CM | POA: Diagnosis present

## 2018-05-13 DIAGNOSIS — I10 Essential (primary) hypertension: Secondary | ICD-10-CM | POA: Diagnosis not present

## 2018-05-13 HISTORY — PX: INCISION AND DRAINAGE ABSCESS: SHX5864

## 2018-05-13 SURGERY — INCISION AND DRAINAGE, ABSCESS
Anesthesia: General

## 2018-05-13 MED ORDER — SUGAMMADEX SODIUM 200 MG/2ML IV SOLN
INTRAVENOUS | Status: DC | PRN
  Administered 2018-05-13: 200 mg via INTRAVENOUS

## 2018-05-13 MED ORDER — OXYCODONE HCL 5 MG PO TABS
5.0000 mg | ORAL_TABLET | Freq: Four times a day (QID) | ORAL | Status: DC | PRN
  Filled 2018-05-13: qty 1

## 2018-05-13 MED ORDER — MIDAZOLAM HCL 5 MG/5ML IJ SOLN
INTRAMUSCULAR | Status: DC | PRN
  Administered 2018-05-13: 2 mg via INTRAVENOUS

## 2018-05-13 MED ORDER — PROPOFOL 10 MG/ML IV BOLUS
INTRAVENOUS | Status: AC
  Filled 2018-05-13: qty 20

## 2018-05-13 MED ORDER — SODIUM CHLORIDE 0.9 % IV SOLN
1.0000 g | INTRAVENOUS | Status: DC
  Administered 2018-05-13 – 2018-05-15 (×3): 1 g via INTRAVENOUS
  Filled 2018-05-13 (×3): qty 10

## 2018-05-13 MED ORDER — HYDROMORPHONE HCL 1 MG/ML IJ SOLN
0.5000 mg | INTRAMUSCULAR | Status: DC | PRN
  Administered 2018-05-13 – 2018-05-15 (×9): 0.5 mg via INTRAVENOUS
  Filled 2018-05-13 (×9): qty 1

## 2018-05-13 MED ORDER — FENTANYL CITRATE (PF) 100 MCG/2ML IJ SOLN
50.0000 ug | Freq: Once | INTRAMUSCULAR | Status: AC
  Administered 2018-05-13: 50 ug via INTRAVENOUS
  Filled 2018-05-13: qty 2

## 2018-05-13 MED ORDER — KCL IN DEXTROSE-NACL 20-5-0.45 MEQ/L-%-% IV SOLN
INTRAVENOUS | Status: DC
  Filled 2018-05-13: qty 1000

## 2018-05-13 MED ORDER — HYDROMORPHONE HCL 1 MG/ML IJ SOLN
INTRAMUSCULAR | Status: AC
  Administered 2018-05-13: 0.25 mg via INTRAVENOUS
  Filled 2018-05-13: qty 1

## 2018-05-13 MED ORDER — PROPOFOL 10 MG/ML IV BOLUS
INTRAVENOUS | Status: DC | PRN
  Administered 2018-05-13: 140 mg via INTRAVENOUS

## 2018-05-13 MED ORDER — FENTANYL CITRATE (PF) 250 MCG/5ML IJ SOLN
INTRAMUSCULAR | Status: AC
  Filled 2018-05-13: qty 5

## 2018-05-13 MED ORDER — PIPERACILLIN-TAZOBACTAM 3.375 G IVPB 30 MIN
3.3750 g | Freq: Once | INTRAVENOUS | Status: AC
  Administered 2018-05-13: 3.375 g via INTRAVENOUS
  Filled 2018-05-13: qty 50

## 2018-05-13 MED ORDER — HYDROMORPHONE HCL 1 MG/ML IJ SOLN
0.2500 mg | INTRAMUSCULAR | Status: DC | PRN
  Administered 2018-05-13 (×4): 0.25 mg via INTRAVENOUS

## 2018-05-13 MED ORDER — ENOXAPARIN SODIUM 40 MG/0.4ML ~~LOC~~ SOLN
40.0000 mg | SUBCUTANEOUS | Status: DC
  Filled 2018-05-13 (×2): qty 0.4

## 2018-05-13 MED ORDER — KCL IN DEXTROSE-NACL 20-5-0.45 MEQ/L-%-% IV SOLN
INTRAVENOUS | Status: DC
  Administered 2018-05-13 – 2018-05-15 (×3): via INTRAVENOUS
  Filled 2018-05-13 (×3): qty 1000

## 2018-05-13 MED ORDER — LACTATED RINGERS IV SOLN: INTRAVENOUS | Status: DC

## 2018-05-13 MED ORDER — ONDANSETRON HCL 4 MG/2ML IJ SOLN
4.0000 mg | Freq: Four times a day (QID) | INTRAMUSCULAR | Status: DC | PRN
  Administered 2018-05-13 (×2): 4 mg via INTRAVENOUS
  Filled 2018-05-13 (×3): qty 2

## 2018-05-13 MED ORDER — ONDANSETRON HCL 4 MG/2ML IJ SOLN
4.0000 mg | Freq: Once | INTRAMUSCULAR | Status: AC
  Administered 2018-05-13: 4 mg via INTRAVENOUS
  Filled 2018-05-13: qty 2

## 2018-05-13 MED ORDER — NORELGESTROMIN-ETH ESTRADIOL 150-35 MCG/24HR TD PTWK: 1.0000 | MEDICATED_PATCH | TRANSDERMAL | Status: DC

## 2018-05-13 MED ORDER — DOCUSATE SODIUM 100 MG PO CAPS
100.0000 mg | ORAL_CAPSULE | Freq: Two times a day (BID) | ORAL | Status: DC
  Administered 2018-05-13 – 2018-05-15 (×4): 100 mg via ORAL
  Filled 2018-05-13 (×5): qty 1

## 2018-05-13 MED ORDER — GABAPENTIN 300 MG PO CAPS
300.0000 mg | ORAL_CAPSULE | Freq: Two times a day (BID) | ORAL | Status: DC
  Administered 2018-05-13 – 2018-05-15 (×4): 300 mg via ORAL
  Filled 2018-05-13 (×5): qty 1

## 2018-05-13 MED ORDER — SACCHAROMYCES BOULARDII 250 MG PO CAPS
250.0000 mg | ORAL_CAPSULE | Freq: Two times a day (BID) | ORAL | Status: DC
  Administered 2018-05-13 – 2018-05-15 (×4): 250 mg via ORAL
  Filled 2018-05-13 (×5): qty 1

## 2018-05-13 MED ORDER — MIDAZOLAM HCL 2 MG/2ML IJ SOLN
INTRAMUSCULAR | Status: AC
  Filled 2018-05-13: qty 2

## 2018-05-13 MED ORDER — 0.9 % SODIUM CHLORIDE (POUR BTL) OPTIME
TOPICAL | Status: DC | PRN
  Administered 2018-05-13: 1000 mL

## 2018-05-13 MED ORDER — LACTATED RINGERS IV SOLN
INTRAVENOUS | Status: DC
  Administered 2018-05-13 (×2): via INTRAVENOUS

## 2018-05-13 MED ORDER — FENTANYL CITRATE (PF) 250 MCG/5ML IJ SOLN
INTRAMUSCULAR | Status: DC | PRN
  Administered 2018-05-13: 50 ug via INTRAVENOUS
  Administered 2018-05-13: 100 ug via INTRAVENOUS
  Administered 2018-05-13: 50 ug via INTRAVENOUS

## 2018-05-13 MED ORDER — LIDOCAINE HCL (CARDIAC) PF 100 MG/5ML IV SOSY
PREFILLED_SYRINGE | INTRAVENOUS | Status: DC | PRN
  Administered 2018-05-13: 50 mg via INTRATRACHEAL

## 2018-05-13 MED ORDER — ONDANSETRON 4 MG PO TBDP
4.0000 mg | ORAL_TABLET | Freq: Four times a day (QID) | ORAL | Status: DC | PRN
  Filled 2018-05-13: qty 1

## 2018-05-13 MED ORDER — MEPERIDINE HCL 50 MG/ML IJ SOLN: 6.2500 mg | INTRAMUSCULAR | Status: DC | PRN

## 2018-05-13 MED ORDER — IOHEXOL 300 MG/ML  SOLN
100.0000 mL | Freq: Once | INTRAMUSCULAR | Status: AC | PRN
  Administered 2018-05-13: 100 mL via INTRAVENOUS

## 2018-05-13 MED ORDER — ROCURONIUM BROMIDE 100 MG/10ML IV SOLN
INTRAVENOUS | Status: DC | PRN
  Administered 2018-05-13: 50 mg via INTRAVENOUS

## 2018-05-13 MED ORDER — ONDANSETRON HCL 4 MG/2ML IJ SOLN
INTRAMUSCULAR | Status: DC | PRN
  Administered 2018-05-13: 4 mg via INTRAVENOUS

## 2018-05-13 MED ORDER — PROMETHAZINE HCL 25 MG/ML IJ SOLN: 6.2500 mg | INTRAMUSCULAR | Status: DC | PRN

## 2018-05-13 MED ORDER — SODIUM CHLORIDE 0.9 % IV BOLUS
1000.0000 mL | Freq: Once | INTRAVENOUS | Status: AC
  Administered 2018-05-13: 1000 mL via INTRAVENOUS

## 2018-05-13 SURGICAL SUPPLY — 28 items
BNDG GAUZE ELAST 4 BULKY (GAUZE/BANDAGES/DRESSINGS) IMPLANT
CANISTER SUCT 3000ML PPV (MISCELLANEOUS) ×3 IMPLANT
COVER SURGICAL LIGHT HANDLE (MISCELLANEOUS) ×3 IMPLANT
COVER WAND RF STERILE (DRAPES) ×1 IMPLANT
DRAPE LAPAROSCOPIC ABDOMINAL (DRAPES) ×3 IMPLANT
DRAPE UTILITY XL STRL (DRAPES) ×4 IMPLANT
DRSG PAD ABDOMINAL 8X10 ST (GAUZE/BANDAGES/DRESSINGS) ×3 IMPLANT
ELECT CAUTERY BLADE 6.4 (BLADE) ×3 IMPLANT
ELECT REM PT RETURN 9FT ADLT (ELECTROSURGICAL) ×3
ELECTRODE REM PT RTRN 9FT ADLT (ELECTROSURGICAL) ×1 IMPLANT
GAUZE PACKING IODOFORM 1/4X15 (GAUZE/BANDAGES/DRESSINGS) ×2 IMPLANT
GAUZE SPONGE 4X4 12PLY STRL (GAUZE/BANDAGES/DRESSINGS) ×3 IMPLANT
GLOVE BIOGEL PI IND STRL 8 (GLOVE) ×1 IMPLANT
GLOVE BIOGEL PI INDICATOR 8 (GLOVE) ×2
GLOVE ECLIPSE 7.5 STRL STRAW (GLOVE) ×3 IMPLANT
GOWN STRL REUS W/ TWL LRG LVL3 (GOWN DISPOSABLE) ×2 IMPLANT
GOWN STRL REUS W/TWL LRG LVL3 (GOWN DISPOSABLE) ×6
KIT BASIN OR (CUSTOM PROCEDURE TRAY) ×3 IMPLANT
KIT TURNOVER KIT B (KITS) ×3 IMPLANT
NS IRRIG 1000ML POUR BTL (IV SOLUTION) ×3 IMPLANT
PACK GENERAL/GYN (CUSTOM PROCEDURE TRAY) ×3 IMPLANT
PAD ABD 8X10 STRL (GAUZE/BANDAGES/DRESSINGS) ×2 IMPLANT
PAD ARMBOARD 7.5X6 YLW CONV (MISCELLANEOUS) ×3 IMPLANT
SWAB COLLECTION DEVICE MRSA (MISCELLANEOUS) ×2 IMPLANT
SWAB CULTURE ESWAB REG 1ML (MISCELLANEOUS) IMPLANT
TAPE CLOTH SURG 6X10 WHT LF (GAUZE/BANDAGES/DRESSINGS) ×2 IMPLANT
TOWEL OR 17X24 6PK STRL BLUE (TOWEL DISPOSABLE) ×3 IMPLANT
TOWEL OR 17X26 10 PK STRL BLUE (TOWEL DISPOSABLE) ×3 IMPLANT

## 2018-05-13 NOTE — H&P (Signed)
Kristin Bruce is an 22 y.o. female.   Chief Complaint: Tailbone pain HPI: 22 year old female comes to the emergency room because of worsening sacral pain.  She has a recent history of incision and drainage of a left-sided perirectal abscess by Dr. Grandville Silos about 5 weeks ago.  She completed oral antibiotics as an outpatient.  She followed up in our clinic earlier this week because the development of sacral discomfort.  She was found to have some tenderness and swelling and was started on oral antibiotics.  After starting the oral antibiotics on Thursday she developed persistent nausea vomiting.  She states that she has not been able to keep anything down.  She has worsening sacral discomfort so she came to the emergency room.  She denies any fevers or chills.  She reports some ongoing scant drainage from the perirectal abscess.  She denies any dysuria.  She denies any melena or hematochezia.  She denies any hematuria.  She unfortunately had withdrawal from college for the semester due to her absence due to her perirectal abscess  Past Medical History:  Diagnosis Date  . Anemia   . Asthma   . Depression   . Dislocation of symphysis pubis   . History of pre-eclampsia in prior pregnancy, currently pregnant   . HSV (herpes simplex virus) anogenital infection   . Mild preeclampsia 03/16/2016  . Positive GBS test 03/16/2016  . Pre-eclampsia, severe 03/17/2016  . Preeclampsia   . Pregnancy induced hypertension   . Vaginal delivery 03/17/2016    Past Surgical History:  Procedure Laterality Date  . INCISION AND DRAINAGE PERIRECTAL ABSCESS N/A 04/01/2018   Procedure: IRRIGATION AND DEBRIDEMENT PERIRECTAL ABSCESS;  Surgeon: Georganna Skeans, MD;  Location: [facility];  Service: General;  Laterality: N/A;  . NO PAST SURGERIES      Family History  Problem Relation Age of Onset  . Diabetes Mother   . Hypertension Mother   . Diabetes Maternal Grandfather    Social History:  reports that she has never  smoked. She has never used smokeless tobacco. She reports that she does not drink alcohol or use drugs.  Allergies: No Known Allergies   (Not in a hospital admission)  Results for orders placed or performed during the hospital encounter of 05/12/18 (from the past 48 hour(s))  Urinalysis, Routine w reflex microscopic     Status: Abnormal   Collection Time: 05/12/18  7:53 PM  Result Value Ref Range   Color, Urine AMBER (A) YELLOW    Comment: BIOCHEMICALS MAY BE AFFECTED BY COLOR   APPearance HAZY (A) CLEAR   Specific Gravity, Urine 1.029 1.005 - 1.030   pH 6.0 5.0 - 8.0   Glucose, UA NEGATIVE NEGATIVE mg/dL   Hgb urine dipstick NEGATIVE NEGATIVE   Bilirubin Urine SMALL (A) NEGATIVE   Ketones, ur 20 (A) NEGATIVE mg/dL   Protein, ur 30 (A) NEGATIVE mg/dL   Nitrite NEGATIVE NEGATIVE   Leukocytes, UA NEGATIVE NEGATIVE   RBC / HPF 0-5 0 - 5 RBC/hpf   WBC, UA 0-5 0 - 5 WBC/hpf   Bacteria, UA FEW (A) NONE SEEN   Squamous Epithelial / LPF 0-5 0 - 5   Mucus PRESENT     Comment: Performed at [facility], [address], [place], [place] [address]  Lipase, blood     Status: None   Collection Time: 05/12/18  8:06 PM  Result Value Ref Range   Lipase 25 11 - 51 U/L    Comment: Performed  at [facility], [place] [address] [place], [place] [address]  Comprehensive metabolic panel     Status: Abnormal   Collection Time: 05/12/18  8:06 PM  Result Value Ref Range   Sodium 136 135 - 145 mmol/L   Potassium 3.9 3.5 - 5.1 mmol/L   Chloride 101 98 - 111 mmol/L   CO2 23 22 - 32 mmol/L   Glucose, Bld 95 70 - 99 mg/dL   BUN 9 6 - 20 mg/dL   Creatinine, Ser 0.70 0.44 - 1.00 mg/dL   Calcium 9.4 8.9 - 10.3 mg/dL   Total Protein 7.9 6.5 - 8.1 g/dL   Albumin 3.5 3.5 - 5.0 g/dL   AST 17 15 - 41 U/L   ALT 14 0 - 44 U/L   Alkaline Phosphatase 77 38 - 126 U/L   Total Bilirubin 2.7 (H) 0.3 - 1.2 mg/dL   GFR calc non Af Amer >60 >60 mL/min   GFR calc Af Amer >60 >60 mL/min    Comment:  (NOTE) The eGFR has been calculated using the CKD EPI equation. This calculation has not been validated in all clinical situations. eGFR's persistently <60 mL/min signify possible Chronic Kidney Disease.    Anion gap 12 5 - 15    Comment: Performed at [facility] [address], [place], [place] [address]  CBC     Status: Abnormal   Collection Time: 05/12/18  8:06 PM  Result Value Ref Range   WBC 12.4 (H) 4.0 - 10.5 K/uL   RBC 4.15 3.87 - 5.11 MIL/uL   Hemoglobin 11.0 (L) 12.0 - 15.0 g/dL   HCT 35.9 (L) 36.0 - 46.0 %   MCV 86.5 80.0 - 100.0 fL   MCH 26.5 26.0 - 34.0 pg   MCHC 30.6 30.0 - 36.0 g/dL   RDW 14.4 11.5 - 15.5 %   Platelets 391 150 - 400 K/uL   nRBC 0.0 0.0 - 0.2 %    Comment: Performed at [facility], [place] [address], [place], [place] [address]  I-Stat beta hCG blood, ED     Status: None   Collection Time: 05/12/18  8:06 PM  Result Value Ref Range   I-stat hCG, quantitative <5.0 <5 mIU/mL   Comment 3            Comment:   GEST. AGE      CONC.  (mIU/mL)   <=1 WEEK        5 - 50     2 WEEKS       50 - 500     3 WEEKS       100 - 10,000     4 WEEKS     1,000 - 30,000        FEMALE AND NON-PREGNANT FEMALE:     LESS THAN 5 mIU/mL    Ct Pelvis W Contrast  Result Date: 05/13/2018 CLINICAL DATA:  22 year old female with perirectal abscess. EXAM: CT PELVIS WITH CONTRAST TECHNIQUE: Multidetector CT imaging of the pelvis was performed using the standard protocol following the bolus administration of intravenous contrast. CONTRAST:  132m OMNIPAQUE IOHEXOL 300 MG/ML  SOLN COMPARISON:  None. FINDINGS: Urinary Tract:  No abnormality visualized. Bowel:  Unremarkable visualized pelvic bowel loops. Vascular/Lymphatic: No pathologically enlarged lymph nodes. No significant vascular abnormality seen. Reproductive:  No mass or other significant abnormality Other:  Small fat containing umbilical hernia. Musculoskeletal: There is a 3.1 x 1.9 x 3.5 cm fluid collection with  surrounding inflammatory changes in the superficial soft tissues of the sacrococcygeal region extending to the intergluteal fold consistent with an abscess. IMPRESSION: Inflammatory fluid collection/abscess in the superficial soft tissues of the sacral coccyx. Electronically Signed   By: Anner Crete M.D.   On: 05/13/2018 05:29    Review of Systems  Constitutional: Positive for malaise/fatigue. Negative for weight loss.  HENT: Negative for nosebleeds.   Eyes: Negative for blurred vision.  Respiratory: Negative for shortness of breath.   Cardiovascular: Negative for chest pain, palpitations, orthopnea and PND.       Denies DOE  Gastrointestinal: Positive for nausea and vomiting.       Sacral pain  Genitourinary: Negative for dysuria and hematuria.  Musculoskeletal: Negative.   Skin: Negative for itching and rash.  Neurological: Negative for dizziness, focal weakness, seizures, loss of consciousness and headaches.       Denies TIAs, amaurosis fugax  Endo/Heme/Allergies: Does not bruise/bleed easily.  Psychiatric/Behavioral: The patient is not nervous/anxious.     Blood pressure 97/67, pulse 87, temperature 98.3 F (36.8 C), temperature source Oral, resp. rate 18, height 5' (1.524 m), weight 48.5 kg, last menstrual period 05/05/2018, SpO2 100 %, unknown if currently breastfeeding. Physical Exam  Vitals reviewed. Constitutional: She is oriented to person, place, and time. She appears well-developed and well-nourished. She appears ill (slightly ill appearance). No distress.  HENT:  Head: Normocephalic and atraumatic.  Right Ear: External ear normal.  Left Ear: External ear normal.  Eyes: Conjunctivae are normal. No scleral icterus.  Neck: Normal range of motion. Neck supple. No tracheal deviation present. No thyromegaly present.  Cardiovascular: Normal rate and normal heart sounds.  Respiratory: Effort normal and breath sounds normal. No stridor. No respiratory distress. She has no  wheezes.  Genitourinary:  Genitourinary Comments: Essentially healed L perirectal incision Classic pilonidal sinus in gluteal cleft with hairs with TTP/induration more on L side  Musculoskeletal: She exhibits no edema or tenderness.  Lymphadenopathy:    She has no cervical adenopathy.  Neurological: She is alert and oriented to person, place, and time. She exhibits normal muscle tone.  Skin: Skin is warm and dry. No rash noted. She is not diaphoretic. No erythema. No pallor.  Psychiatric: She has a normal mood and affect. Her behavior is normal. Judgment and thought content normal.     Assessment/Plan Pilonidal abscess Recent history of incision and drainage perirectal abscess  We discussed pilonidal disease. Since she has an abscess I recommended surgical incision and drainage.  Antibiotic therapy.  We discussed the risk and benefits of surgery including but not limited to bleeding, infection, scarring, recurrence, blood clot formation, need for additional procedures, typical postoperative course.  We discussed perioperative cardiac and pulmonary events.  All of her questions were asked and answered.  We will plan on taking her to the operating room later this morning with Dr. Darrick Penna. Redmond Pulling, MD, FACS General, Bariatric, & Minimally Invasive Surgery [facility], [place]   Greer Pickerel, MD 05/13/2018, 6:56 AM

## 2018-05-13 NOTE — Transfer of Care (Signed)
Immediate Anesthesia Transfer of Care Note  Patient: [name]  Procedure(s) Performed: INCISION AND DRAINAGE, PILONIDAL  ABSCESS (N/A )  Patient Location: PACU  Anesthesia Type:General  Level of Consciousness: awake, alert  and oriented  Airway & Oxygen Therapy: Patient Spontanous Breathing  Post-op Assessment: Report given to RN and Post -op Vital signs reviewed and stable  Post vital signs: Reviewed and stable  Last Vitals:  Vitals Value Taken Time  BP 108/80 05/13/2018  9:35 AM  Temp    Pulse    Resp 20 05/13/2018  9:37 AM  SpO2    Vitals shown include unvalidated device data.  Last Pain:  Vitals:   05/13/18 0549  TempSrc:   PainSc: 0-No pain         Complications: No apparent anesthesia complications

## 2018-05-13 NOTE — Op Note (Addendum)
OPERATIVE REPORT  DATE OF OPERATION: 05/13/2018  PATIENT:  Kristin Bruce  22 y.o. female  PRE-OPERATIVE DIAGNOSIS:  PILONIDAL ABSCESS  POST-OPERATIVE DIAGNOSIS:  Same  INDICATION(S) FOR OPERATION: History of a previously draining perirectal abscess, now with a recurrent pilonidal abscess.  FINDINGS: Fairly large pilonidal abscess with 2 punctum's included in the incision for drainage.  Pocket filled with an entire bottle of quarter inch iodoform Nu Gauze.  PROCEDURE:  Procedure(s): INCISION AND DRAINAGE, PILONIDAL  ABSCESS  SURGEON:  Surgeon(s): Jimmye Norman, MD  ASSISTANT: Clovis Riley, PA-S  ANESTHESIA:   general  COMPLICATIONS: None  EBL: 10 ml  BLOOD ADMINISTERED: none  DRAINS: The wound was packed with an entire bottle of quarter inch iodoform Nu Gauze.   SPECIMEN:  Source of Specimen:  Aerobic and anaerobic cultures were sent for microbiology.  COUNTS CORRECT:  YES  PROCEDURE DETAILS: The patient was taken to the operating room and placed on the table in supine position.  After an adequate general endotracheal anesthetic was administered, she was placed in the jackknife prone position and prepped and draped in usual sterile manner.  A proper timeout was performed identifying the patient and procedure to be performed.  The surgeon and assistant identified the opening of the pilonidal abscess and placed a Kelly clamp into the cavity opening up with a large amount of pus being extruded.  Using 15 blade and electrocautery to extend the incision proximally up through the punctum's that were noted preoperatively.  We irrigated the cavity with saline solution, broke up the loculations in the cavity using the surgeon's finger, then packed with an entire bottle of quarter inch iodoform Nu Gauze.  4 x 4's and ABD pad were used to sterile dressings.  All needle counts, sponge counts, and instrument counts were correct.  PATIENT DISPOSITION:  PACU - hemodynamically  stable.   Jimmye Norman 10/26/20199:32 AM

## 2018-05-13 NOTE — Plan of Care (Signed)

## 2018-05-13 NOTE — Anesthesia Preprocedure Evaluation (Addendum)
Anesthesia Evaluation  Patient identified by MRN, date of birth, ID band Patient awake    Reviewed: Allergy & Precautions, NPO status , Patient's Chart, lab work & pertinent test results  Airway Mallampati: I  TM Distance: >3 FB Neck ROM: Full    Dental  (+) Teeth Intact, Dental Advisory Given, Chipped,    Pulmonary asthma ,    breath sounds clear to auscultation       Cardiovascular hypertension,  Rhythm:Regular Rate:Normal     Neuro/Psych Depression    GI/Hepatic negative GI ROS, Neg liver ROS,   Endo/Other  negative endocrine ROS  Renal/GU negative Renal ROS  negative genitourinary   Musculoskeletal negative musculoskeletal ROS (+)   Abdominal Normal abdominal exam  (+)   Peds  Hematology negative hematology ROS (+)   Anesthesia Other Findings Bit lower lip while eating yesterday, still swollen.   Reproductive/Obstetrics                            Anesthesia Physical Anesthesia Plan  ASA: II  Anesthesia Plan: General   Post-op Pain Management:    Induction: Intravenous  PONV Risk Score and Plan: 4 or greater and Ondansetron, Dexamethasone, Midazolam and Scopolamine patch - Pre-op  Airway Management Planned: Oral ETT  Additional Equipment: None  Intra-op Plan:   Post-operative Plan: Extubation in OR  Informed Consent: I have reviewed the patients History and Physical, chart, labs and discussed the procedure including the risks, benefits and alternatives for the proposed anesthesia with the patient or authorized representative who has indicated his/her understanding and acceptance.   Dental advisory given  Plan Discussed with: CRNA  Anesthesia Plan Comments:        Anesthesia Quick Evaluation

## 2018-05-13 NOTE — Anesthesia Procedure Notes (Signed)
Procedure Name: Intubation Date/Time: 05/13/2018 8:38 AM Performed by: Babs Bertin, CRNA Pre-anesthesia Checklist: Patient identified, Emergency Drugs available, Suction available and Patient being monitored Patient Re-evaluated:Patient Re-evaluated prior to induction Oxygen Delivery Method: Circle System Utilized Preoxygenation: Pre-oxygenation with 100% oxygen Induction Type: IV induction Ventilation: Mask ventilation without difficulty Laryngoscope Size: Mac and 3 Tube type: Oral Tube size: 7.0 mm Number of attempts: 1 Airway Equipment and Method: Stylet and Oral airway Placement Confirmation: ETT inserted through vocal cords under direct vision,  positive ETCO2 and breath sounds checked- equal and bilateral Secured at: 20 cm Tube secured with: Tape Dental Injury: Teeth and Oropharynx as per pre-operative assessment

## 2018-05-13 NOTE — ED Notes (Signed)
Pt. To CT via stretcher. 

## 2018-05-13 NOTE — ED Notes (Signed)
Consent for surgery signed and placed at bedside. Patients valuables signed for by patient and locked up with security.

## 2018-05-13 NOTE — Interval H&P Note (Signed)
History and Physical Interval Note:  Very sore in the pilonidal area without much fluctuance.  Will review CT then be prepared to take to the OR in the prone position.  Marta Lamas. Gae Bon, MD, FACS [phone] [facility]  05/13/2018 8:15 AM  Kristin Bruce  has presented today for surgery, with the diagnosis of PILONIDAL ABSCESS  The various methods of treatment have been discussed with the patient and family. After consideration of risks, benefits and other options for treatment, the patient has consented to  Procedure(s): INCISION AND DRAINAGE, PILONIDAL  ABSCESS (N/A) as a surgical intervention .  The patient's history has been reviewed, patient examined, no change in status, stable for surgery.  I have reviewed the patient's chart and labs.  Questions were answered to the patient's satisfaction.     Jimmye Norman

## 2018-05-13 NOTE — ED Provider Notes (Signed)
[facility] EMERGENCY DEPARTMENT Provider Note   CSN: [id number] Arrival date & time: 05/12/18  1906     History   Chief Complaint Chief Complaint  Patient presents with  . Abdominal Pain  . Abscess    rectal    HPI Kristin Bruce is a 22 y.o. female.  Patient is a 22 year old female with rectal pain.  She was seen by [facility] surgery in September for a perirectal abscess.  She was admitted from the office for IV antibiotics and the next day had an I&D of a perirectal abscess.  She states that she noted some increased swelling and pain to her perirectal area over the last week and went back to [place] surgery 2 days ago.  At that time they noticed the tenderness and placed her on doxycycline.  She states that they were concerned about a pilonidal abscess but opted to treat with antibiotics prior to doing another I&D.  She states that she has not been able to keep down the doxycycline or her pain medication.  She denies any fevers.  No abdominal pain.  No drainage from her perirectal area.     Past Medical History:  Diagnosis Date  . Anemia   . Asthma   . Depression   . Dislocation of symphysis pubis   . History of pre-eclampsia in prior pregnancy, currently pregnant   . HSV (herpes simplex virus) anogenital infection   . Mild preeclampsia 03/16/2016  . Positive GBS test 03/16/2016  . Pre-eclampsia, severe 03/17/2016  . Preeclampsia   . Pregnancy induced hypertension   . Vaginal delivery 03/17/2016    Patient Active Problem List   Diagnosis Date Noted  . Perirectal abscess 03/31/2018  . Asthma affecting pregnancy, antepartum 03/16/2016  . Anemia of pregnancy 01/07/2016  . Adjustment disorder with depressed mood 11/22/2015    Past Surgical History:  Procedure Laterality Date  . INCISION AND DRAINAGE PERIRECTAL ABSCESS N/A 04/01/2018   Procedure: IRRIGATION AND DEBRIDEMENT PERIRECTAL ABSCESS;  Surgeon: Violeta Gelinas, MD;  Location:  [facility] OR;  Service: General;  Laterality: N/A;  . NO PAST SURGERIES       OB History    Gravida  2   Para  1   Term  1   Preterm      AB      Living  1     SAB      TAB      Ectopic      Multiple  0   Live Births  1            Home Medications    Prior to Admission medications   Medication Sig Start Date End Date Taking? Authorizing Provider  acetaminophen (TYLENOL) 500 MG tablet You can take 1000 mg every 6 hours as needed for pain.  Use this is your primary pain control.  You can alternate this with the ibuprofen.  If he is continued to have pain you can use the oxycodone as a last resort. DO NOT TAKE MORE THAN 4000 MG OF TYLENOL PER DAY.  IT CAN HARM YOUR LIVER. 04/02/18   Sherrie George, PA-C  ibuprofen (ADVIL,MOTRIN) 200 MG tablet You can take 2 to 3 tablets every 6 hours as needed for pain.  You can alternate this with plain Tylenol, or the oxycodone.  You can buy this over-the-counter without a prescription.  Do not exceed this amount it can cause kidney injury and ulcers. 04/02/18   Sherrie George,  PA-C  oxyCODONE (OXY IR/ROXICODONE) 5 MG immediate release tablet Take 1 tablet (5 mg total) by mouth every 6 (six) hours as needed (Pain not relieved with plain Tylenol and ibuprofen). 04/02/18   Sherrie George, PA-C  saccharomyces boulardii (FLORASTOR) 250 MG capsule This is a probiotic to use along with yogurt to help replace your normal bowel flora.  You can buy this over-the-counter at any drugstore.  Take for at least 1 week after you completed your antibiotics.  You need a prescription for this. 04/02/18   Sherrie George, PA-C  Burr Medico 150-35 MCG/24HR transdermal patch Apply 1 patch topically once a week. 03/09/18   [provider]    Family History Family History  Problem Relation Age of Onset  . Diabetes Mother   . Hypertension Mother   . Diabetes Maternal Grandfather     Social History Social History   Tobacco Use  . Smoking status:  Never Smoker  . Smokeless tobacco: Never Used  Substance Use Topics  . Alcohol use: No    Comment: not since pregnancy  . Drug use: No     Allergies   Patient has no known allergies.   Review of Systems Review of Systems  Constitutional: Negative for chills, diaphoresis, fatigue and fever.  HENT: Negative for congestion, rhinorrhea and sneezing.   Eyes: Negative.   Respiratory: Negative for cough, chest tightness and shortness of breath.   Cardiovascular: Negative for chest pain and leg swelling.  Gastrointestinal: Positive for nausea, rectal pain and vomiting. Negative for abdominal pain, blood in stool and diarrhea.  Genitourinary: Negative for difficulty urinating, flank pain, frequency and hematuria.  Musculoskeletal: Negative for arthralgias and back pain.  Skin: Negative for rash.  Neurological: Negative for dizziness, speech difficulty, weakness, numbness and headaches.     Physical Exam Updated Vital Signs BP 97/67 (BP Location: Right Arm)   Pulse 87   Temp 98.3 F (36.8 C) (Oral)   Resp 18   Ht 5' (1.524 m)   Wt 48.5 kg   LMP 05/05/2018 (Within Days)   SpO2 100%   BMI 20.90 kg/m   Physical Exam  Constitutional: She is oriented to person, place, and time. She appears well-developed and well-nourished.  HENT:  Head: Normocephalic and atraumatic.  Eyes: Pupils are equal, round, and reactive to light.  Neck: Normal range of motion. Neck supple.  Cardiovascular: Normal rate, regular rhythm and normal heart sounds.  Pulmonary/Chest: Effort normal and breath sounds normal. No respiratory distress. She has no wheezes. She has no rales. She exhibits no tenderness.  Abdominal: Soft. Bowel sounds are normal. There is no tenderness. There is no rebound and no guarding.  Genitourinary:  Genitourinary Comments: Patient has fullness and tenderness to the gluteal cleft.  There is no tenderness to the actual perirectal area.  No drainage is noted.  No fluctuant abscesses  palpated.  Musculoskeletal: Normal range of motion. She exhibits no edema.  Lymphadenopathy:    She has no cervical adenopathy.  Neurological: She is alert and oriented to person, place, and time.  Skin: Skin is warm and dry. No rash noted.  Psychiatric: She has a normal mood and affect.     ED Treatments / Results  Labs (all labs ordered are listed, but only abnormal results are displayed) Labs Reviewed  COMPREHENSIVE METABOLIC PANEL - Abnormal; Notable for the following components:      Result Value   Total Bilirubin 2.7 (*)    All other components within normal limits  CBC -  Abnormal; Notable for the following components:   WBC 12.4 (*)    Hemoglobin 11.0 (*)    HCT 35.9 (*)    All other components within normal limits  URINALYSIS, ROUTINE W REFLEX MICROSCOPIC - Abnormal; Notable for the following components:   Color, Urine AMBER (*)    APPearance HAZY (*)    Bilirubin Urine SMALL (*)    Ketones, ur 20 (*)    Protein, ur 30 (*)    Bacteria, UA FEW (*)    All other components within normal limits  LIPASE, BLOOD  I-STAT BETA HCG BLOOD, ED ([facility], [facility], [facility] ONLY)    EKG None  Radiology Ct Pelvis W Contrast  Result Date: 05/13/2018 CLINICAL DATA:  22 year old female with perirectal abscess. EXAM: CT PELVIS WITH CONTRAST TECHNIQUE: Multidetector CT imaging of the pelvis was performed using the standard protocol following the bolus administration of intravenous contrast. CONTRAST:  OMNIPAQUE IOHEXOL 300 MG/ML  SOLN COMPARISON:  None. FINDINGS: Urinary Tract:  No abnormality visualized. Bowel:  Unremarkable visualized pelvic bowel loops. Vascular/Lymphatic: No pathologically enlarged lymph nodes. No significant vascular abnormality seen. Reproductive:  No mass or other significant abnormality Other:  Small fat containing umbilical hernia. Musculoskeletal: There is a 3.1 x 1.9 x 3.5 cm fluid collection with surrounding inflammatory changes in the superficial soft tissues of the  sacrococcygeal region extending to the intergluteal fold consistent with an abscess. IMPRESSION: Inflammatory fluid collection/abscess in the superficial soft tissues of the sacral coccyx. Electronically Signed   By: Elgie Collard M.D.   On: 05/13/2018 05:29    Procedures Procedures (including critical care time)  Medications Ordered in ED Medications  ondansetron (ZOFRAN-ODT) disintegrating tablet 4 mg (4 mg Oral Given 05/12/18 1954)  fentaNYL (SUBLIMAZE) injection 50 mcg (50 mcg Intravenous Given 05/13/18 0428)  piperacillin-tazobactam (ZOSYN) IVPB 3.375 g (0 g Intravenous Stopped 05/13/18 0547)  sodium chloride 0.9 % bolus 1,000 mL (0 mLs Intravenous Stopped 05/13/18 0547)  ondansetron (ZOFRAN) injection 4 mg (4 mg Intravenous Given 05/13/18 0428)  iohexol (OMNIPAQUE) 300 MG/ML solution 100 mL (100 mLs Intravenous Contrast Given 05/13/18 0459)     Initial Impression / Assessment and Plan / ED Course  I have reviewed the triage vital signs and the nursing notes.  Pertinent labs & imaging results that were available during my care of the patient were reviewed by me and considered in my medical decision making (see chart for details).     Patient is a 22 year old female who presents with pain to her buttocks area at the gluteal cleft.  CT scan shows a 3 x 3 cm abscess over the sacrum.  She was given IV antibiotics.  Her white count is elevated.  I spoke with Dr. Andrey Campanile with general surgery who will see the patient.  Final Clinical Impressions(s) / ED Diagnoses   Final diagnoses:  Abscess of buttock    ED Discharge Orders    None       Rolan Bucco, MD 05/13/18 [phone]

## 2018-05-13 NOTE — Anesthesia Postprocedure Evaluation (Signed)
Anesthesia Post Note  Patient: Kristin Bruce  Procedure(s) Performed: INCISION AND DRAINAGE, PILONIDAL  ABSCESS (N/A )     Patient location during evaluation: PACU Anesthesia Type: General Level of consciousness: awake and alert Pain management: pain level controlled Vital Signs Assessment: post-procedure vital signs reviewed and stable Respiratory status: spontaneous breathing, nonlabored ventilation, respiratory function stable and patient connected to nasal cannula oxygen Cardiovascular status: blood pressure returned to baseline and stable Postop Assessment: no apparent nausea or vomiting Anesthetic complications: no    Last Vitals:  Vitals:   05/13/18 1050 05/13/18 1117  BP: 110/71 114/76  Pulse: (!) 104 97  Resp: 14 15  Temp: 37.1 C 36.8 C  SpO2: 98% 97%    Last Pain:  Vitals:   05/13/18 1117  TempSrc: Oral  PainSc:                  [name]

## 2018-05-14 ENCOUNTER — Encounter (HOSPITAL_COMMUNITY): Payer: Self-pay | Admitting: General Surgery

## 2018-05-14 NOTE — Plan of Care (Signed)
  Problem: Nutrition: Goal: Adequate nutrition will be maintained Outcome: Progressing   Problem: Elimination: Goal: Will not experience complications related to bowel motility Outcome: Progressing Goal: Will not experience complications related to urinary retention Outcome: Progressing   Problem: Safety: Goal: Ability to remain free from injury will improve Outcome: Progressing   

## 2018-05-14 NOTE — Progress Notes (Signed)
[facility] Office:  [phone] General Surgery Progress Note   LOS: 0 days  POD -  1 Day Post-Op  Chief Complaint: Pilonidal abscess  Assessment and Plan: 1.  INCISION AND DRAINAGE, PILONIDAL ABSCESS - 05/14/2018 - D. Joey Hudock  Rocephin  Doing okay - needs to ambulate  2.  Perirectal abscess drained by Dr. Janee Morn - 04/01/2018  2.  DVT prophylaxis - Lovenox   Active Problems:   Pilonidal abscess  Subjective:  Alert.  Hurts.    Kristin Bruce, in room with patient  Objective:   Vitals:   05/14/18 0243 05/14/18 0514  BP: 116/69 120/75  Pulse: 95 98  Resp: 17 18  Temp: 98.4 F (36.9 C) 98.2 F (36.8 C)  SpO2: 98% 99%     Intake/Output from previous day:  10/26 0701 - 10/27 0700 In: 3078.7 [P.O.:250; I.V.:2328.7; IV Piggyback:100] Out: 5 [Blood:5]  Intake/Output this shift:  No intake/output data recorded.   Physical Exam:   General: Thin young AA F who is alert and oriented.    HEENT: Normal. Pupils equal. .   Lungs: Clear   Abdomen: Soft   Wound: dressing intact and clean   Lab Results:    Recent Labs    05/12/18 2006  WBC 12.4*  HGB 11.0*  HCT 35.9*  PLT 391    BMET   Recent Labs    05/12/18 2006  NA 136  K 3.9  CL 101  CO2 23  GLUCOSE 95  BUN 9  CREATININE 0.70  CALCIUM 9.4    PT/INR  No results for input(s): LABPROT, INR in the last 72 hours.  ABG  No results for input(s): PHART, HCO3 in the last 72 hours.  Invalid input(s): PCO2, PO2   Studies/Results:  Ct Pelvis W Contrast  Result Date: 05/13/2018 CLINICAL DATA:  22 year old female with perirectal abscess. EXAM: CT PELVIS WITH CONTRAST TECHNIQUE: Multidetector CT imaging of the pelvis was performed using the standard protocol following the bolus administration of intravenous contrast. CONTRAST:  OMNIPAQUE IOHEXOL 300 MG/ML  SOLN COMPARISON:  None. FINDINGS: Urinary Tract:  No abnormality visualized. Bowel:  Unremarkable visualized pelvic bowel loops.  Vascular/Lymphatic: No pathologically enlarged lymph nodes. No significant vascular abnormality seen. Reproductive:  No mass or other significant abnormality Other:  Small fat containing umbilical hernia. Musculoskeletal: There is a 3.1 x 1.9 x 3.5 cm fluid collection with surrounding inflammatory changes in the superficial soft tissues of the sacrococcygeal region extending to the intergluteal fold consistent with an abscess. IMPRESSION: Inflammatory fluid collection/abscess in the superficial soft tissues of the sacral coccyx. Electronically Signed   By: Elgie Collard M.D.   On: 05/13/2018 05:29     Anti-infectives:   Anti-infectives (From admission, onward)   Start     Dose/Rate Route Frequency Ordered Stop   05/13/18 1000  cefTRIAXone (ROCEPHIN) 1 g in sodium chloride 0.9 % 100 mL IVPB     1 g 200 mL/hr over 30 Minutes Intravenous Every 24 hours 05/13/18 0953     05/13/18 0400  piperacillin-tazobactam (ZOSYN) IVPB 3.375 g     3.375 g 100 mL/hr over 30 Minutes Intravenous  Once 05/13/18 0359 05/13/18 0547      Ovidio Kin, MD, FACS Pager: [phone] [facility] Office: [phone] 05/14/2018

## 2018-05-15 MED ORDER — ACETAMINOPHEN 500 MG PO TABS
1000.0000 mg | ORAL_TABLET | Freq: Four times a day (QID) | ORAL | Status: DC
  Administered 2018-05-15: 1000 mg via ORAL
  Filled 2018-05-15: qty 2

## 2018-05-15 MED ORDER — TRAMADOL HCL 50 MG PO TABS
50.0000 mg | ORAL_TABLET | Freq: Four times a day (QID) | ORAL | Status: DC | PRN
  Administered 2018-05-15: 100 mg via ORAL
  Filled 2018-05-15: qty 2

## 2018-05-15 MED ORDER — IBUPROFEN 400 MG PO TABS
400.0000 mg | ORAL_TABLET | Freq: Four times a day (QID) | ORAL | Status: DC | PRN
  Administered 2018-05-15: 800 mg via ORAL
  Filled 2018-05-15: qty 2

## 2018-05-15 MED ORDER — TRAMADOL HCL 50 MG PO TABS: 50.0000 mg | ORAL_TABLET | Freq: Four times a day (QID) | ORAL | 0 refills | Status: DC | PRN

## 2018-05-15 NOTE — Discharge Summary (Signed)
[facility] Discharge Summary   Patient ID: Kristin Bruce MRN: [id number] DOB/AGE: 1995/10/06 22 y.o.  Admit date: 05/12/2018 Discharge date: 05/15/2018   Discharge Diagnosis Patient Active Problem List   Diagnosis Date Noted  . Pilonidal abscess 05/13/2018  . Perirectal abscess 03/31/2018  . Asthma affecting pregnancy, antepartum 03/16/2016  . Anemia of pregnancy 01/07/2016  . Adjustment disorder with depressed mood 11/22/2015    Consultants None   Procedures Dr. Jimmye Bruce (05/13/18) - I&D pilonidal abscess  Hospital Course:  22 year old female came to the emergency room because of worsening sacral pain.  She had a recent history of incision and drainage of a left-sided perirectal abscess by Dr. Janee Bruce about 5 weeks ago (04/01/18).  She completed oral antibiotics as an outpatient.  ER workup revealed pilonidal abscess, CT Abd/pelv performed by EDP showed 3.1 x 1.9 x 3.5 cm fluid collection over the sacral coccyx. Patient was taken for above procedure which she tolerated well. On the morning of POD#2 packing was removed from the wound. The patients pain was controlled on PO meds, she was tolerating PO, and mobilizing independently. She was stable for discharge home with instructions to perform sitz baths 3-4 times daily and to shower daily letting the warm, soapy water run through her wound. She will follow up in our office for a wound check in 7-10 days and will be seen by Kristin Bruce or one of his partners to discuss definitive tx of pilonidal cyst.    Physical Exam: General:  Alert, NAD, pleasant, comfortable Abd:  Soft, non-tender, non-distended GU: ~4 cm vertical incision over coccyx with some extension into proximal intergluteal cleft. Sanguinous and purulent drainage present but the wound bed is clean with no surrounding induration.  Allergies as of 05/15/2018   No Known Allergies     Medication List    STOP taking these medications   doxycycline 100 MG  tablet Commonly known as:  VIBRA-TABS   oxyCODONE 5 MG immediate release tablet Commonly known as:  Oxy IR/ROXICODONE     TAKE these medications   acetaminophen 500 MG tablet Commonly known as:  TYLENOL You can take 1000 mg every 6 hours as needed for pain.  Use this is your primary pain control.  You can alternate this with the ibuprofen.  If he is continued to have pain you can use the oxycodone as a last resort. DO NOT TAKE MORE THAN 4000 MG OF TYLENOL PER DAY.  IT CAN HARM YOUR LIVER.   ibuprofen 200 MG tablet Commonly known as:  ADVIL,MOTRIN You can take 2 to 3 tablets every 6 hours as needed for pain.  You can alternate this with plain Tylenol, or the oxycodone.  You can buy this over-the-counter without a prescription.  Do not exceed this amount it can cause kidney injury and ulcers.   saccharomyces boulardii 250 MG capsule Commonly known as:  FLORASTOR This is a probiotic to use along with yogurt to help replace your normal bowel flora.  You can buy this over-the-counter at any drugstore.  Take for at least 1 week after you completed your antibiotics.  You need a prescription for this.   traMADol 50 MG tablet Commonly known as:  ULTRAM Take 1 tablet (50 mg total) by mouth every 6 (six) hours as needed for severe pain.   XULANE 150-35 MCG/24HR transdermal patch Generic drug:  norelgestromin-ethinyl estradiol Apply 1 patch topically once a week.        Follow-up Information  Kristin Norman, MD Follow up.   Specialty:  General Surgery Contact information: [address] [place] [address] [phone]           Signed: [name], [facility] 05/15/2018, 9:48 AM

## 2018-05-15 NOTE — Discharge Instructions (Signed)
SHOWER DAILY WITH WOUND OPEN  SITZ BATHS 3-4 TIMES DAILY  BATHE IMMEDIATELY IF WOUND GETS CONTAMINATED WITH STOOL  ALTERNATE TYLENOL ([phone] mg every 6-8 hours) and ibuprofen (400mg  every 8 hours) for pain. Take Tramadol as needed if pain is not controlled with tylenol/ibuprofen.  Call our office with any questions or concerns, like we discussed (fever, chills, worsening redness/firm tissue around your wound, nausea/vomiting)

## 2018-05-15 NOTE — Progress Notes (Signed)
Pt discharge home with significant other.  Pt understands her discharge instructions and wound care Has her f/u appointments

## 2018-05-17 LAB — AEROBIC/ANAEROBIC CULTURE W GRAM STAIN (SURGICAL/DEEP WOUND): Gram Stain: NONE SEEN

## 2018-05-17 LAB — AEROBIC/ANAEROBIC CULTURE (SURGICAL/DEEP WOUND)

## 2018-06-08 ENCOUNTER — Ambulatory Visit: Payer: Self-pay | Admitting: Surgery

## 2018-06-08 NOTE — H&P (Signed)
CC: Consult by Dr. Lindie Spruce for pilonidal disease  HPI: Kristin Bruce is a very pleasant 22yoF with hx of pilonidal abscess which is undergone incision and drainage by my partner Dr. Lindie Spruce 05/13/18. She has recovered well from this and had previously been doing packing but the wound has ~closed. She denies any complaints today. Of note, she also had a perirectal abscess on the left side which was drained by my partner Dr. Janee Morn 04/01/18. This area has completely closed.  PMH: Perirectal abscess Pilnoidal abscess  PSH: I&D of perirectal abscess 04/01/18 I&D of separate pilonidal abscess 05/13/18  FHx: Denies FHx of malignancy  Social: Denies use of tobacco/EtOH/drugs  ROS: A comprehensive 10 system review of systems was completed with the patient and pertinent findings as noted above.  The patient is a 22 year old female.    Review of Systems Kristin Deer M. Lachlyn Vanderstelt MD; 06/06/2018 10:33 AM) General Present- Fatigue and Night Sweats. Not Present- Chills, Fever and Weight Gain. Skin Not Present- Change in Wart/Mole, Dryness, Hives, Jaundice, New Lesions, Non-Healing Wounds, Rash and Ulcer. HEENT Not Present- Earache, Hearing Loss, Hoarseness, Nose Bleed, Oral Ulcers, Ringing in the Ears, Seasonal Allergies, Sinus Pain, Sore Throat, Visual Disturbances, Wears glasses/contact lenses and Yellow Eyes. Respiratory Not Present- Bloody sputum, Chronic Cough, Difficulty Breathing, Snoring and Wheezing. Breast Not Present- Breast Mass, Breast Pain, Nipple Discharge and Skin Changes. Cardiovascular Present- Leg Cramps and Rapid Heart Rate. Not Present- Chest Pain, Palpitations, Shortness of Breath and Swelling of Extremities. Gastrointestinal Present- Constipation. Not Present- Abdominal Pain, Bloating, Bloody Stool, Change in Bowel Habits, Chronic diarrhea, Difficulty Swallowing, Excessive gas, Gets full quickly at meals, Hemorrhoids, Indigestion, Nausea, Rectal Pain and Vomiting. Female Genitourinary Not  Present- Frequency, Nocturia, Painful Urination, Pelvic Pain and Urgency. Musculoskeletal Present- Joint Stiffness, Muscle Pain and Muscle Weakness. Not Present- Back Pain, Joint Pain and Swelling of Extremities. Neurological Present- Trouble walking and Weakness. Not Present- Decreased Memory, Fainting, Headaches, Numbness, Seizures, Tingling and Tremor. Psychiatric Present- Anxiety, Change in Sleep Pattern and Frequent crying. Not Present- Bipolar, Depression and Fearful. Endocrine Present- Hot flashes. Not Present- Cold Intolerance, Excessive Hunger, Hair Changes, Heat Intolerance and New Diabetes. Hematology Not Present- Blood Thinners, Easy Bruising, Excessive bleeding, Gland problems, HIV and Persistent Infections.   Physical Exam Kristin Deer M. Nubia Ziesmer MD; 06/06/2018 10:34 AM) The physical exam findings are as follows: Note:Constitutional: No acute distress; conversant; no deformities Eyes: Moist conjunctiva; no lid lag; anicteric sclerae; pupils equal round and reactive to light Neck: Trachea midline; no palpable thyromegaly Lungs: Normal respiratory effort; no tactile fremitus CV: Regular rate and rhythm; no palpable thrill; no pitting edema GI: Abdomen soft, nontender, nondistended; no palpable hepatosplenomegaly Skin: Superior midline gluteal cleft-multiple midline pits with an almost completely healed pilonidal wound-1 x 1 cm area that remains open which is covered in granulation tissue. No active abscess or erythema. No fluctuance. No active drainage. No perianal/perirectal abscesses or disease noted. No open wounds. MSK: Normal gait; no clubbing/cyanosis Psychiatric: Appropriate affect; alert and oriented 3 Lymphatic: No palpable cervical or axillary lymphadenopathy **A chaperone, [name] RMA, was present for the entire physical exam    Assessment & Plan Kristin Deer M. Joahan Swatzell MD; 06/06/2018 10:37 AM) PILONIDAL DISEASE (L98.8) Story: [name] is a very pleasant 22yoF  with hx of perirectal abscess and what appears to be unrelated pilonidal disease which developed an abscess which was drained in the OR 05/13/18 Impression: -The anatomy and physiology of the gluteal cleft was discussed at length with the patient.  The pathophysiology of pilonidal disease was discussed at length with associated pictures and illustrations. -We discussed options for treatment of forward including further observation with the risks of recurrent pilonidal abscess and surgery. We discussed excision of the bile disease down to the fascia overlying the sacral bone with potentially marsupializing the wound edges in. We discussed that there would be an open wound following his procedure which would remain in place for up to 3-6 months while the tissue healed. -The procedure, material risks (including, but not limited to, pain, bleeding, infection, scarring, need for additional procedures, recurrence - in up to 30% of cases, pneumonia, heart attack, stroke, death) benefits and alternatives to surgery were discussed at length. I noted a good probability that the procedure would help improve her symptoms. The patient's questions were answered to her satisfaction, she voiced understanding and after considering all options, opted to proceed with surgery. Additionally, we discussed typical postoperative expectations and the recovery process.  Signed electronically by Kristin Meusehristopher M Kaidyn Javid, MD (06/06/2018 10:37 AM)

## 2018-07-06 ENCOUNTER — Encounter (HOSPITAL_BASED_OUTPATIENT_CLINIC_OR_DEPARTMENT_OTHER): Payer: Self-pay

## 2018-07-10 ENCOUNTER — Encounter (HOSPITAL_BASED_OUTPATIENT_CLINIC_OR_DEPARTMENT_OTHER): Payer: Self-pay

## 2018-07-10 ENCOUNTER — Other Ambulatory Visit: Payer: Self-pay

## 2018-07-10 NOTE — Patient Instructions (Signed)
Your procedure is scheduled on:  Thursday, Jan 2., 2019  [facility]  [facility]   Call this number if you have problems the morning of surgery:  [phone].   OUR ADDRESS IS [address].  WE ARE LOCATED IN [place]                                   [facility].                                     REMEMBER:  DO NOT EAT FOOD OR DRINK LIQUIDS AFTER MIDNIGHT .    BRUSH YOUR TEETH THE MORNING OF SURGERY.  TAKE THESE MEDICATIONS MORNING OF SURGERY WITH A SIP OF WATER:  None  DO NOT WEAR JEWERLY, MAKE UP, OR NAIL POLISH.  DO NOT WEAR LOTIONS, POWDERS, PERFUMES/COLOGNE OR DEODORANT.  DO NOT SHAVE FOR 24 HOURS PRIOR TO DAY OF SURGERY.  MEN MAY SHAVE FACE AND NECK.  CONTACTS, GLASSES, OR DENTURES MAY NOT BE WORN TO SURGERY.                                    [facility] IS NOT RESPONSIBLE  FOR ANY BELONGINGS.                                                                      [facility] - Preparing for Surgery Before surgery, you can play an important role.  Because skin is not sterile, your skin needs to be as free of germs as possible.  You can reduce the number of germs on your skin by washing with CHG (chlorahexidine gluconate) soap before surgery.  CHG is an antiseptic cleaner which kills germs and bonds with the skin to continue killing germs even after washing. Please DO NOT use if you have an allergy to CHG or antibacterial soaps.  If your skin becomes reddened/irritated stop using the CHG and inform your nurse when you arrive at Short Stay. Do not shave (including legs and underarms) for at least 48 hours prior to the first CHG shower.  You may shave your face/neck.  Please follow these instructions carefully:  1.  Shower with CHG Soap the night before surgery and the  morning of surgery.  2.  If you choose to wash your hair, wash your hair first as usual with your normal  shampoo.  3.  After you shampoo, rinse your hair and body  thoroughly to remove the shampoo.                             4.  Use CHG as you would any other liquid soap.  You can apply chg directly to the skin and wash.  Gently with a scrungie or clean washcloth.  5.  Apply the CHG Soap to your body ONLY FROM THE NECK DOWN.   Do   not use on face/ open  Wound or open sores. Avoid contact with eyes, ears mouth and   genitals (private parts).                       Wash face,  Genitals (private parts) with your normal soap.             6.  Wash thoroughly, paying special attention to the area where your    surgery  will be performed.  7.  Thoroughly rinse your body with warm water from the neck down.  8.  DO NOT shower/wash with your normal soap after using and rinsing off the CHG Soap.                9.  Pat yourself dry with a clean towel.            10.  Wear clean pajamas.            11.  Place clean sheets on your bed the night of your first shower and do not  sleep with pets. Day of Surgery : Do not apply any lotions/deodorants the morning of surgery.  Please wear clean clothes to the hospital/surgery center.  FAILURE TO FOLLOW THESE INSTRUCTIONS MAY RESULT IN THE CANCELLATION OF YOUR SURGERY  PATIENT SIGNATURE_________________________________  NURSE SIGNATURE__________________________________  ________________________________________________________________________

## 2018-07-10 NOTE — Progress Notes (Signed)
Spoke with: Nickayla NPO:  After Midnight, no gum, candy, or mints   Arrival time: [id number] Labs: (PT, PTT, CBC/diff, CMP drawn 07/14/2018 in epic) AM medications: None Pre op orders: Yes Ride home: Greggory BrandySalinda (mom) [phone] [place] shower and instructions given

## 2018-07-14 ENCOUNTER — Encounter (HOSPITAL_COMMUNITY)
Admission: RE | Admit: 2018-07-14 | Discharge: 2018-07-14 | Disposition: A | Discharge status: Home / Self Care | Payer: Medicaid Other | Source: Ambulatory Visit | Attending: Surgery | Admitting: Surgery

## 2018-07-14 DIAGNOSIS — R9431 Abnormal electrocardiogram [ECG] [EKG]: Secondary | ICD-10-CM | POA: Diagnosis not present

## 2018-07-14 DIAGNOSIS — Z01818 Encounter for other preprocedural examination: Secondary | ICD-10-CM | POA: Insufficient documentation

## 2018-07-14 LAB — COMPREHENSIVE METABOLIC PANEL
ALK PHOS: 57 U/L (ref 38–126)
ALT: 17 U/L (ref 0–44)
AST: 18 U/L (ref 15–41)
Albumin: 3.6 g/dL (ref 3.5–5.0)
Anion gap: 7 (ref 5–15)
BUN: 14 mg/dL (ref 6–20)
CALCIUM: 8.8 mg/dL — AB (ref 8.9–10.3)
CO2: 24 mmol/L (ref 22–32)
Chloride: 106 mmol/L (ref 98–111)
Creatinine, Ser: 0.61 mg/dL (ref 0.44–1.00)
GFR calc Af Amer: >60 mL/min (ref >60)
GFR calc non Af Amer: >60 mL/min (ref >60)
Glucose, Bld: 86 mg/dL (ref 70–99)
Potassium: 4.6 mmol/L (ref 3.5–5.1)
SODIUM: 137 mmol/L (ref 135–145)
Total Bilirubin: 1.2 mg/dL (ref 0.3–1.2)
Total Protein: 8.1 g/dL (ref 6.5–8.1)

## 2018-07-14 LAB — CBC WITH DIFFERENTIAL/PLATELET
Abs Immature Granulocytes: 0.01 10*3/uL (ref 0.00–0.07)
Basophils Absolute: 0 10*3/uL (ref 0.0–0.1)
Basophils Relative: 0 %
Eosinophils Absolute: 0.1 10*3/uL (ref 0.0–0.5)
Eosinophils Relative: 1 %
HCT: 36.7 % (ref 36.0–46.0)
Hemoglobin: 11 g/dL — ABNORMAL LOW (ref 12.0–15.0)
Immature Granulocytes: 0 %
Lymphocytes Relative: 43 %
Lymphs Abs: 2.4 10*3/uL (ref 0.7–4.0)
MCH: 26.7 pg (ref 26.0–34.0)
MCHC: 30 g/dL (ref 30.0–36.0)
MCV: 89.1 fL (ref 80.0–100.0)
Monocytes Absolute: 0.3 10*3/uL (ref 0.1–1.0)
Monocytes Relative: 5 %
Neutro Abs: 2.8 10*3/uL (ref 1.7–7.7)
Neutrophils Relative %: 51 %
Platelets: 418 10*3/uL — ABNORMAL HIGH (ref 150–400)
RBC: 4.12 MIL/uL (ref 3.87–5.11)
RDW: 14.1 % (ref 11.5–15.5)
WBC: 5.6 10*3/uL (ref 4.0–10.5)
nRBC: 0 % (ref 0.0–0.2)

## 2018-07-14 LAB — PROTIME-INR
INR: 1.05
Prothrombin Time: 13.6 seconds (ref 11.4–15.2)

## 2018-07-14 LAB — APTT: aPTT: 31 seconds (ref 24–36)

## 2018-07-17 NOTE — Progress Notes (Signed)
CBC/diff, CMP 07/14/2018 results sent to Dr. Cliffton AstersWhite via epic.

## 2018-07-20 ENCOUNTER — Ambulatory Visit (HOSPITAL_BASED_OUTPATIENT_CLINIC_OR_DEPARTMENT_OTHER)
Admission: RE | Admit: 2018-07-20 | Discharge: 2018-07-20 | Disposition: A | Discharge status: Home / Self Care | Payer: Medicaid Other | Attending: Surgery | Admitting: Surgery

## 2018-07-20 ENCOUNTER — Encounter (HOSPITAL_BASED_OUTPATIENT_CLINIC_OR_DEPARTMENT_OTHER)
Admission: RE | Disposition: A | Discharge status: Home / Self Care | Payer: Self-pay | Source: Home / Self Care | Attending: Surgery

## 2018-07-20 ENCOUNTER — Encounter (HOSPITAL_BASED_OUTPATIENT_CLINIC_OR_DEPARTMENT_OTHER): Payer: Self-pay

## 2018-07-20 ENCOUNTER — Ambulatory Visit (HOSPITAL_BASED_OUTPATIENT_CLINIC_OR_DEPARTMENT_OTHER): Payer: Medicaid Other | Admitting: Anesthesiology

## 2018-07-20 ENCOUNTER — Ambulatory Visit (HOSPITAL_BASED_OUTPATIENT_CLINIC_OR_DEPARTMENT_OTHER): Payer: Medicaid Other | Admitting: Physician Assistant

## 2018-07-20 DIAGNOSIS — L0591 Pilonidal cyst without abscess: Secondary | ICD-10-CM | POA: Diagnosis present

## 2018-07-20 DIAGNOSIS — I10 Essential (primary) hypertension: Secondary | ICD-10-CM | POA: Diagnosis not present

## 2018-07-20 HISTORY — PX: PILONIDAL CYST EXCISION: SHX744

## 2018-07-20 HISTORY — DX: Other specified disorders of the skin and subcutaneous tissue: L98.8

## 2018-07-20 HISTORY — DX: Presence of spectacles and contact lenses: Z97.3

## 2018-07-20 HISTORY — DX: Suicidal ideations: R45.851

## 2018-07-20 LAB — POCT PREGNANCY, URINE: PREG TEST UR: NEGATIVE

## 2018-07-20 SURGERY — EXCISION, SIMPLE PILONIDAL CYST
Anesthesia: General | Site: Coccyx

## 2018-07-20 MED ORDER — MIDAZOLAM HCL 2 MG/2ML IJ SOLN
INTRAMUSCULAR | Status: DC | PRN
  Administered 2018-07-20: 1 mg via INTRAVENOUS

## 2018-07-20 MED ORDER — CHLORHEXIDINE GLUCONATE CLOTH 2 % EX PADS
6.0000 | MEDICATED_PAD | Freq: Once | CUTANEOUS | Status: DC
  Filled 2018-07-20: qty 6

## 2018-07-20 MED ORDER — OXYCODONE HCL 5 MG PO TABS: 5.0000 mg | ORAL_TABLET | Freq: Four times a day (QID) | ORAL | 0 refills | Status: AC | PRN

## 2018-07-20 MED ORDER — MEPERIDINE HCL 25 MG/ML IJ SOLN
INTRAMUSCULAR | Status: AC
  Filled 2018-07-20: qty 1

## 2018-07-20 MED ORDER — SUCCINYLCHOLINE CHLORIDE 200 MG/10ML IV SOSY
PREFILLED_SYRINGE | INTRAVENOUS | Status: DC | PRN
  Administered 2018-07-20: 80 mg via INTRAVENOUS

## 2018-07-20 MED ORDER — CELECOXIB 200 MG PO CAPS
ORAL_CAPSULE | ORAL | Status: AC
  Filled 2018-07-20: qty 1

## 2018-07-20 MED ORDER — SUCCINYLCHOLINE CHLORIDE 200 MG/10ML IV SOSY
PREFILLED_SYRINGE | INTRAVENOUS | Status: AC
  Filled 2018-07-20: qty 10

## 2018-07-20 MED ORDER — LACTATED RINGERS IV SOLN
INTRAVENOUS | Status: DC
  Administered 2018-07-20: 10:00:00 via INTRAVENOUS
  Filled 2018-07-20: qty 1000

## 2018-07-20 MED ORDER — DEXAMETHASONE SODIUM PHOSPHATE 10 MG/ML IJ SOLN
INTRAMUSCULAR | Status: AC
  Filled 2018-07-20: qty 1

## 2018-07-20 MED ORDER — ONDANSETRON HCL 4 MG/2ML IJ SOLN
INTRAMUSCULAR | Status: AC
  Filled 2018-07-20: qty 2

## 2018-07-20 MED ORDER — DEXAMETHASONE SODIUM PHOSPHATE 10 MG/ML IJ SOLN
INTRAMUSCULAR | Status: DC | PRN
  Administered 2018-07-20: 5 mg via INTRAVENOUS

## 2018-07-20 MED ORDER — OXYCODONE HCL 5 MG/5ML PO SOLN
5.0000 mg | Freq: Once | ORAL | Status: DC | PRN
  Filled 2018-07-20: qty 5

## 2018-07-20 MED ORDER — MIDAZOLAM HCL 2 MG/2ML IJ SOLN
INTRAMUSCULAR | Status: AC
  Filled 2018-07-20: qty 2

## 2018-07-20 MED ORDER — GABAPENTIN 300 MG PO CAPS
300.0000 mg | ORAL_CAPSULE | ORAL | Status: AC
  Administered 2018-07-20: 300 mg via ORAL
  Filled 2018-07-20: qty 1

## 2018-07-20 MED ORDER — CEFAZOLIN SODIUM-DEXTROSE 2-4 GM/100ML-% IV SOLN
2.0000 g | INTRAVENOUS | Status: AC
  Administered 2018-07-20: 2 g via INTRAVENOUS
  Filled 2018-07-20: qty 100

## 2018-07-20 MED ORDER — CEFAZOLIN SODIUM-DEXTROSE 2-4 GM/100ML-% IV SOLN
INTRAVENOUS | Status: AC
  Filled 2018-07-20: qty 100

## 2018-07-20 MED ORDER — HYDROMORPHONE HCL 1 MG/ML IJ SOLN
0.2500 mg | INTRAMUSCULAR | Status: DC | PRN
  Filled 2018-07-20: qty 0.5

## 2018-07-20 MED ORDER — PROPOFOL 10 MG/ML IV BOLUS
INTRAVENOUS | Status: DC | PRN
  Administered 2018-07-20: 120 mg via INTRAVENOUS

## 2018-07-20 MED ORDER — ONDANSETRON HCL 4 MG/2ML IJ SOLN
INTRAMUSCULAR | Status: DC | PRN
  Administered 2018-07-20: 4 mg via INTRAVENOUS

## 2018-07-20 MED ORDER — GABAPENTIN 300 MG PO CAPS
ORAL_CAPSULE | ORAL | Status: AC
  Filled 2018-07-20: qty 1

## 2018-07-20 MED ORDER — BUPIVACAINE-EPINEPHRINE 0.25% -1:200000 IJ SOLN
INTRAMUSCULAR | Status: DC | PRN
  Administered 2018-07-20: 25 mL

## 2018-07-20 MED ORDER — MEPERIDINE HCL 25 MG/ML IJ SOLN
6.2500 mg | INTRAMUSCULAR | Status: DC | PRN
  Administered 2018-07-20: 6.25 mg via INTRAVENOUS
  Filled 2018-07-20: qty 1

## 2018-07-20 MED ORDER — FENTANYL CITRATE (PF) 100 MCG/2ML IJ SOLN
INTRAMUSCULAR | Status: DC | PRN
  Administered 2018-07-20 (×2): 50 ug via INTRAVENOUS

## 2018-07-20 MED ORDER — LIDOCAINE 2% (20 MG/ML) 5 ML SYRINGE
INTRAMUSCULAR | Status: AC
  Filled 2018-07-20: qty 5

## 2018-07-20 MED ORDER — ACETAMINOPHEN 500 MG PO TABS
1000.0000 mg | ORAL_TABLET | ORAL | Status: AC
  Administered 2018-07-20: 1000 mg via ORAL
  Filled 2018-07-20: qty 2

## 2018-07-20 MED ORDER — OXYCODONE HCL 5 MG PO TABS
5.0000 mg | ORAL_TABLET | Freq: Once | ORAL | Status: DC | PRN
  Filled 2018-07-20: qty 1

## 2018-07-20 MED ORDER — PROPOFOL 10 MG/ML IV BOLUS
INTRAVENOUS | Status: AC
  Filled 2018-07-20: qty 20

## 2018-07-20 MED ORDER — LACTATED RINGERS IV SOLN
INTRAVENOUS | Status: DC
  Administered 2018-07-20: 13:00:00 via INTRAVENOUS
  Filled 2018-07-20: qty 1000

## 2018-07-20 MED ORDER — ACETAMINOPHEN 500 MG PO TABS
ORAL_TABLET | ORAL | Status: AC
  Filled 2018-07-20: qty 2

## 2018-07-20 MED ORDER — CELECOXIB 200 MG PO CAPS
200.0000 mg | ORAL_CAPSULE | ORAL | Status: AC
  Administered 2018-07-20: 200 mg via ORAL
  Filled 2018-07-20: qty 1

## 2018-07-20 MED ORDER — LIDOCAINE 2% (20 MG/ML) 5 ML SYRINGE
INTRAMUSCULAR | Status: DC | PRN
  Administered 2018-07-20: 40 mg via INTRAVENOUS

## 2018-07-20 MED ORDER — FENTANYL CITRATE (PF) 100 MCG/2ML IJ SOLN
INTRAMUSCULAR | Status: AC
  Filled 2018-07-20: qty 2

## 2018-07-20 MED ORDER — PROMETHAZINE HCL 25 MG/ML IJ SOLN
6.2500 mg | INTRAMUSCULAR | Status: DC | PRN
  Filled 2018-07-20: qty 1

## 2018-07-20 MED ORDER — BUPIVACAINE LIPOSOME 1.3 % IJ SUSP
INTRAMUSCULAR | Status: DC | PRN
  Administered 2018-07-20: 15 mL

## 2018-07-20 MED ORDER — ARTIFICIAL TEARS OPHTHALMIC OINT
TOPICAL_OINTMENT | OPHTHALMIC | Status: AC
  Filled 2018-07-20: qty 3.5

## 2018-07-20 SURGICAL SUPPLY — 53 items
ADH SKN CLS APL DERMABOND .7 (GAUZE/BANDAGES/DRESSINGS) ×1
APL SKNCLS STERI-STRIP NONHPOA (GAUZE/BANDAGES/DRESSINGS) ×1
BENZOIN TINCTURE PRP APPL 2/3 (GAUZE/BANDAGES/DRESSINGS) ×3 IMPLANT
BLADE EXTENDED COATED 6.5IN (ELECTRODE) IMPLANT
BLADE HEX COATED 2.75 (ELECTRODE) ×3 IMPLANT
BLADE SURG 10 STRL SS (BLADE) IMPLANT
BLADE SURG 15 STRL LF DISP TIS (BLADE) ×1 IMPLANT
BLADE SURG 15 STRL SS (BLADE) ×3
BNDG GAUZE ELAST 4 BULKY (GAUZE/BANDAGES/DRESSINGS) ×2 IMPLANT
BRIEF STRETCH FOR OB PAD LRG (UNDERPADS AND DIAPERS) ×3 IMPLANT
CANISTER SUCT 3000ML PPV (MISCELLANEOUS) ×3 IMPLANT
COVER BACK TABLE 60X90IN (DRAPES) ×3 IMPLANT
COVER MAYO STAND STRL (DRAPES) ×3 IMPLANT
COVER WAND RF STERILE (DRAPES) ×4 IMPLANT
DECANTER SPIKE VIAL GLASS SM (MISCELLANEOUS) ×3 IMPLANT
DERMABOND ADVANCED (GAUZE/BANDAGES/DRESSINGS) ×2
DERMABOND ADVANCED .7 DNX12 (GAUZE/BANDAGES/DRESSINGS) ×1 IMPLANT
DRAPE LAPAROTOMY 100X72 PEDS (DRAPES) ×3 IMPLANT
DRAPE SHEET LG 3/4 BI-LAMINATE (DRAPES) IMPLANT
DRAPE UTILITY XL STRL (DRAPES) ×3 IMPLANT
ELECT REM PT RETURN 9FT ADLT (ELECTROSURGICAL) ×3
ELECTRODE REM PT RTRN 9FT ADLT (ELECTROSURGICAL) ×1 IMPLANT
GAUZE SPONGE 4X4 12PLY STRL (GAUZE/BANDAGES/DRESSINGS) ×2 IMPLANT
GAUZE SPONGE 4X4 16PLY NS LF (WOUND CARE) IMPLANT
GAUZE VASELINE 3X9 (GAUZE/BANDAGES/DRESSINGS) IMPLANT
GLOVE BIO SURGEON STRL SZ7.5 (GLOVE) ×3 IMPLANT
GLOVE INDICATOR 8.0 STRL GRN (GLOVE) ×3 IMPLANT
GOWN STRL REUS W/ TWL LRG LVL3 (GOWN DISPOSABLE) ×1 IMPLANT
GOWN STRL REUS W/TWL LRG LVL3 (GOWN DISPOSABLE) ×6
KIT TURNOVER CYSTO (KITS) ×3 IMPLANT
NDL SAFETY ECLIPSE 18X1.5 (NEEDLE) IMPLANT
NEEDLE HYPO 18GX1.5 SHARP (NEEDLE)
NEEDLE HYPO 22GX1.5 SAFETY (NEEDLE) ×3 IMPLANT
NS IRRIG 500ML POUR BTL (IV SOLUTION) ×3 IMPLANT
PACK BASIN DAY SURGERY FS (CUSTOM PROCEDURE TRAY) ×3 IMPLANT
PAD ABD 8X10 STRL (GAUZE/BANDAGES/DRESSINGS) ×3 IMPLANT
PAD ARMBOARD 7.5X6 YLW CONV (MISCELLANEOUS) ×3 IMPLANT
PENCIL BUTTON HOLSTER BLD 10FT (ELECTRODE) ×3 IMPLANT
SPONGE LAP 18X18 RF (DISPOSABLE) IMPLANT
SPONGE LAP 4X18 RFD (DISPOSABLE) IMPLANT
SPONGE SURGIFOAM ABS GEL 12-7 (HEMOSTASIS) IMPLANT
SUT ETHILON 2 0 PS N (SUTURE) ×3 IMPLANT
SUT MNCRL AB 3-0 PS2 18 (SUTURE) ×8 IMPLANT
SUT VIC AB 2-0 SH 27 (SUTURE) ×3
SUT VIC AB 2-0 SH 27XBRD (SUTURE) ×1 IMPLANT
SUT VIC AB 3-0 SH 18 (SUTURE) ×3 IMPLANT
SYR BULB IRRIGATION 50ML (SYRINGE) ×3 IMPLANT
SYR CONTROL 10ML LL (SYRINGE) ×3 IMPLANT
TOWEL OR 17X24 6PK STRL BLUE (TOWEL DISPOSABLE) ×6 IMPLANT
TRAY DSU PREP LF (CUSTOM PROCEDURE TRAY) ×3 IMPLANT
TUBE CONNECTING 12'X1/4 (SUCTIONS) ×1
TUBE CONNECTING 12X1/4 (SUCTIONS) ×2 IMPLANT
YANKAUER SUCT BULB TIP NO VENT (SUCTIONS) ×3 IMPLANT

## 2018-07-20 NOTE — Anesthesia Preprocedure Evaluation (Signed)
Anesthesia Evaluation  Patient identified by MRN, date of birth, ID band Patient awake    Reviewed: Allergy & Precautions, NPO status , Patient's Chart, lab work & pertinent test results  Airway Mallampati: I  TM Distance: >3 FB Neck ROM: Full    Dental no notable dental hx. (+) Teeth Intact, Chipped, Dental Advisory Given,    Pulmonary asthma ,    Pulmonary exam normal breath sounds clear to auscultation       Cardiovascular hypertension,  Rhythm:Regular Rate:Normal     Neuro/Psych Depression    GI/Hepatic negative GI ROS, Neg liver ROS,   Endo/Other  negative endocrine ROS  Renal/GU negative Renal ROS  negative genitourinary   Musculoskeletal negative musculoskeletal ROS (+)   Abdominal Normal abdominal exam  (+)   Peds  Hematology negative hematology ROS (+)   Anesthesia Other Findings   Reproductive/Obstetrics                            Anesthesia Physical  Anesthesia Plan  ASA: II  Anesthesia Plan: General   Post-op Pain Management:    Induction: Intravenous  PONV Risk Score and Plan: 4 or greater and Ondansetron, Dexamethasone, Midazolam and Scopolamine patch - Pre-op  Airway Management Planned: Oral ETT  Additional Equipment: None  Intra-op Plan:   Post-operative Plan: Extubation in OR  Informed Consent: I have reviewed the patients History and Physical, chart, labs and discussed the procedure including the risks, benefits and alternatives for the proposed anesthesia with the patient or authorized representative who has indicated his/her understanding and acceptance.   Dental advisory given  Plan Discussed with: CRNA  Anesthesia Plan Comments:         Anesthesia Quick Evaluation

## 2018-07-20 NOTE — Op Note (Addendum)
07/20/2018  11:48 AM  PATIENT:  Kristin Bruce  23 y.o. female  Patient Care Team: Patient, No Pcp Per as PCP - General (General Practice)  PRE-OPERATIVE DIAGNOSIS:  Pilonidal disease  POST-OPERATIVE DIAGNOSIS:  Same  PROCEDURE:  Excision of pilonidal disease  SURGEON:  Surgeon(s): Andria Meuse, MD  ASSISTANT: OR Staff   ANESTHESIA:   general  SPECIMEN:  Pilonidal disease - Short suture cephalad, long suture left side  DISPOSITION OF SPECIMEN:  PATHOLOGY  COUNTS:  Sponge, needle and instrument counts were reported correct x2  PLAN OF CARE: Discharge to home after PACU  PATIENT DISPOSITION:  PACU - hemodynamically stable.  INDICATION: Kristin Bruce is an 23 y.o. female who is here for excision of pilonidal disease. She was seen in the office 06/08/18 - with hx of pilonidal abscess which had undergone incision and drainage by my partner Dr. Lindie Spruce 05/13/18. She recovered well from this - had previously been doing packing but the wound closed. Of note, she also had a perirectal abscess on the left side which was drained by my partner Dr. Janee Morn 04/01/18. This area has completely closed as well. She was found to have multiple midline pits consistent with pilonidal disease - now in the setting of prior pilonidal abscess. No perianal/perirectal wounds/fistulas were identified - the prior perianal incision/drainage of her perirectal abscess had completely healed. Options were discussed with her going forward - she opted to undergo excision of pilonidal disease. Please refer to H&P for details regarding this discussion.  OR FINDINGS: Multiple midline pits overlying sacrum containing tufts of hair. The tracked cephalad and caudad from their location. The final wound measured 8 x 4 x 4 cm (L x W x depth). All pilonidal disease was excised, the specimen oriented and wound edges marsupialized.  DESCRIPTION: The patient was identified in the preoperative holding area and she was  taken to the OR. SCDs were placed.  General endotracheal anesthesia was induced without difficulty. She was then rolled onto the operating room table in the prone position. All pressure points were then padded. Benzoin was applied to the buttocks and they were then gently taped apart. Hair on the lower back, buttocks and perianal area was then clipped.  The patient was then prepped and draped in usual sterile fashion with betadine.  A surgical timeout was performed indicating the correct patient, procedure, positioning and need for preoperative antibiotics.  The perianal skin was inspected and completely normal.  A well lubricated digital rectal exam was performed given her history of perianal abscess in addition to pilonidal disease.  The digital rectal exam was normal.  There were no palpable abnormalities.  Multiple midline pits were noted overlying the sacrum.  These were gently probed with a tonsil and tufts of hair were removed.  The sinus tracts tracked cephalad and caudad to the extent of the midline pits.  After probing the pits, the lateral edges of excision were marked.  The skin was then incised, incorporating all disease.  Subcutaneous tissue was divided down to the level of the fascia overlying the sacrum using electrocautery.  All cavities contain disease were included in the excision as 1 unit.  The ellipse of skin containing the pilonidal disease was then oriented with a short suture cephalad and the long suture on the left side.  This was then passed off as a specimen.  The wound was then irrigated with sterile saline and hemostasis achieved with electrocautery.  The wound was inspected and palpated and  there were no other areas with palpable disease.  The final wound measured 8 x 4 x 4 cm (L x W x depth). The wound edges were then marsupialized using 3-0 Monocryl suture down to the fascia overlying the sacrum.  Local anesthetic consisting of a dilute mixture of Marcaine with Exparel was  infiltrated into the tissue around the site of excision.  Hemostasis was then again verified.  A moist 4 x 4 dressing was placed over the wound and covered with additional dry 4 x 4 gauze.  An ABD pad was then placed and secured with mesh underwear.  The patient was then transferred back to a stretcher, awakened from anesthesia, extubated, and transported to PACU in satisfactory condition  DISPOSITION: PACU in satisfactory condition.

## 2018-07-20 NOTE — H&P (Signed)
CC: Here today for surgery - pilonidal disease excision  HPI: Kristin OatsKennita L Gang is an 23 y.o. female who is here for excision of pilonidal disease. She was seen in the office 06/08/18 - with hx of pilonidal abscess which had undergone incision and drainage by my partner Dr. Lindie SpruceWyatt 05/13/18. She recovered well from this - had previously been doing packing but the wound closed. Of note, she also had a perirectal abscess on the left side which was drained by my partner Dr. Janee Mornhompson 04/01/18. This area has completely closed as well. Denies any complaints today. She states she is ready for her procedure.  Past Medical History:  Diagnosis Date  . Anemia   . Asthma   . Depression   . Dislocation of symphysis pubis   . History of pre-eclampsia in prior pregnancy, currently pregnant   . HSV (herpes simplex virus) anogenital infection   . Pilonidal disease   . Positive GBS test 03/16/2016  . Pre-eclampsia, severe 03/17/2016  . Pregnancy induced hypertension   . Suicidal thoughts    during pregnancy  . Vaginal delivery 03/17/2016  . Wears glasses     Past Surgical History:  Procedure Laterality Date  . INCISION AND DRAINAGE ABSCESS N/A 05/13/2018   Procedure: INCISION AND DRAINAGE, PILONIDAL  ABSCESS;  Surgeon: Jimmye NormanWyatt, James, MD;  Location: [facility] OR;  Service: General;  Laterality: N/A;  . INCISION AND DRAINAGE PERIRECTAL ABSCESS N/A 04/01/2018   Procedure: IRRIGATION AND DEBRIDEMENT PERIRECTAL ABSCESS;  Surgeon: Violeta Gelinashompson, Burke, MD;  [facility];  Service: General;  Laterality: N/A;  . NO PAST SURGERIES      Family History  Problem Relation Age of Onset  . Diabetes Mother   . Hypertension Mother   . Diabetes Maternal Grandfather     Social:  reports that she has never smoked. She has never used smokeless tobacco. She reports previous alcohol use. She reports that she does not use drugs.  Allergies: No Known Allergies  Medications: I have reviewed the patient's current medications.  No  results found for this or any previous visit (from the past 48 hour(s)).  No results found.  ROS - all of the below systems have been reviewed with the patient and positives are indicated with bold text General: chills, fever or night sweats Eyes: blurry vision or double vision ENT: epistaxis or sore throat Allergy/Immunology: itchy/watery eyes or nasal congestion Hematologic/Lymphatic: bleeding problems, blood clots or swollen lymph nodes Endocrine: temperature intolerance or unexpected weight changes Breast: new or changing breast lumps or nipple discharge Resp: cough, shortness of breath, or wheezing CV: chest pain or dyspnea on exertion GI: as per HPI GU: dysuria, trouble voiding, or hematuria MSK: joint pain or joint stiffness Neuro: TIA or stroke symptoms Derm: pruritus and skin lesion changes Psych: anxiety and depression  PE Height 5' (1.524 m), weight 48.5 kg, last menstrual period 07/05/2018, unknown if currently breastfeeding. Constitutional: NAD; conversant; no deformities Eyes: Moist conjunctiva; no lid lag; anicteric; PERRL Neck: Trachea midline; no thyromegaly Lungs: Normal respiratory effort; no tactile fremitus CV: RRR; no palpable thrills; no pitting edema GI: Abd soft, NT/ND; no palpable hepatosplenomegaly MSK: Normal gait; no clubbing/cyanosis Psychiatric: Appropriate affect; alert and oriented x3 Lymphatic: No palpable cervical or axillary lymphadenopathy  A/P: Kristin Bruce is an 23 y.o. female with hx of perirectal abscess and what appears to be unrelated pilonidal disease which developed an abscess which was drained in the OR 05/13/18 - here today for surgery for excision of pilonidal disease  -  The anatomy and physiology of the gluteal cleft was discussed at length with the patient again today. The pathophysiology of pilonidal disease was discussed at length as well. -We had previously discussed options for treatment going forward including further  observation with the risks of recurrent pilonidal abscess vs surgery. We discussed excision of the pilonidal disease down to the fascia overlying the sacral bone with potentially marsupializing the wound edges in. We discussed that there would be an open wound following his procedure which would remain in place and may take up to 3-6 months to close while the tissue healed. -The planned procedure, material risks (including, but not limited to, pain, bleeding, infection, scarring, need for additional procedures, recurrence - in up to 30% of cases, pneumonia, heart attack, stroke, death) benefits and alternatives to surgery were discussed at length. I noted a good probability that the procedure would help improve her symptoms. The patient's questions were answered to her satisfaction, she voiced understanding and after considering all options, has opted to proceed with surgery. Additionally, we discussed typical postoperative expectations and the recovery process.  Stephanie Couphristopher M. Cliffton AstersWhite, M.D. General and Colorectal Surgery [facility]

## 2018-07-20 NOTE — Transfer of Care (Signed)
Last Vitals:  Vitals Value Taken Time  BP 119/83 07/20/2018 12:06 PM  Temp    Pulse 128 07/20/2018 12:08 PM  Resp 24 07/20/2018 12:08 PM  SpO2 100 % 07/20/2018 12:08 PM  Vitals shown include unvalidated device data.  Last Pain:  Vitals:   07/20/18 0932  TempSrc: Oral  PainSc: 0-No pain      Patients Stated Pain Goal: 7 (07/20/18 0932) Immediate Anesthesia Transfer of Care Note  Patient: Kristin Bruce  Procedure(s) Performed: Procedure(s) (LRB): EXCISION OF PILONIDAL DISEASE (N/A)  Patient Location: PACU  Anesthesia Type: General  Level of Consciousness: awake, alert  and oriented  Airway & Oxygen Therapy: Patient Spontanous Breathing and Patient connected to nasal cannula  oxygen  Post-op Assessment: Report given to PACU RN and Post -op Vital signs reviewed and stable  Post vital signs: Reviewed and stable  Complications: No apparent anesthesia complications

## 2018-07-20 NOTE — Discharge Instructions (Addendum)
Post Anesthesia Home Care Instructions  Activity: Get plenty of rest for the remainder of the day. A responsible individual must stay with you for 24 hours following the procedure.  For the next 24 hours, DO NOT: -Drive a car -Advertising copywriterperate machinery -Drink alcoholic beverages -Take any medication unless instructed by your physician -Make any legal decisions or sign important papers.  Meals: Start with liquid foods such as gelatin or soup. Progress to regular foods as tolerated. Avoid greasy, spicy, heavy foods. If nausea and/or vomiting occur, drink only clear liquids until the nausea and/or vomiting subsides. Call your physician if vomiting continues.  Special Instructions/Symptoms: Your throat may feel dry or sore from the anesthesia or the breathing tube placed in your throat during surgery. If this causes discomfort, gargle with warm salt water. The discomfort should disappear within 24 hours.  If you had a scopolamine patch placed behind your ear for the management of post- operative nausea and/or vomiting:  1. The medication in the patch is effective for 72 hours, after which it should be removed.  Wrap patch in a tissue and discard in the trash. Wash hands thoroughly with soap and water. 2. You may remove the patch earlier than 72 hours if you experience unpleasant side effects which may include dry mouth, dizziness or visual disturbances. 3. Avoid touching the patch. Wash your hands with soap and water after contact with the patch.   ANORECTAL SURGERY: POST OP INSTRUCTIONS  1. DIET: Follow a light bland diet the first 24 hours after arrival home, such as soup, liquids, crackers, etc.  Be sure to include lots of fluids daily.  Avoid fast food or heavy meals as your are more likely to get nauseated.  Eat a low fat diet the next few days after surgery.    2. Take your usually prescribed home medications unless otherwise directed.  3. PAIN CONTROL: a. It is helpful to take an  over-the-counter pain medication regularly for the first few days/weeks.  Choose from the following that works best for you: i. Ibuprofen (Advil, etc) Three 200mg  tabs every 6 hours as needed. ii. Acetaminophen (Tylenol, etc) 500-650mg  every 6 hours as needed iii. NOTE: You may take both of these medications together - most patients find it most helpful when alternating between the two (i.e. Ibuprofen at 6am, tylenol at 9am, ibuprofen at 12pm ...) b. A  prescription for pain medication may have been prescribed for you at discharge.  Take your pain medication as prescribed.  i. If you are having problems/concerns with the prescription medicine, please call us for further advice.  4. Avoid getting constipated.  Between the surgery and the pain medications, it is common to experience some constipation.  Increasing fluid intake (64oz of water per day) and taking a fiber supplement (such as Metamucil, Citrucel, FiberCon) 1-2 times a day regularly will usually help prevent this problem from occurring.  Take Miralax (over the counter) 1-2x/day while taking a narcotic pain medication. If no bowel movement after 48hours, you may additionally take a laxative like a bottle of Milk of Magnesia which can be purchased over the counter. Avoid enemas if possible as these are often painful.   5. Watch out for diarrhea.  If you have many loose bowel movements, simplify your diet to bland foods.  Stop any stool softeners and decrease your fiber supplement. If this worsens or does not improve, please call us.  6. Wash / shower every day. You may remove the surgical dressing the day  after your surgery. You may shower normally, getting soap/water on your wound. At first you may need to change the dressing twice daily - as the amount of "saturation" of the dressing decreases, you may drop down to once daily dressing changes. This typically occurs 7-14 days out from the procedure. Place clean moist 4 x 4 gauze in the wound bed  and cover with dry gauze and some dressing tape. The 4x4 guaze and dressing tape may be obtained at the drug store. A small supply has additionally been provided to you to go home with today. The wound is open and will heal from the base out over the ensuing weeks. Some bleeding from the wound is normal, especially with dressing changes. This is usually well controlled by applying the new dressing. Applying some pressure to the area with your hand on the dressing for 5-10 minutes typically works well too. If bleeding persists or you have concerns, certainly let us know or come to the emergency room for further evaluation if you have any issues getting in contact with [place].  7. ACTIVITIES as tolerated:   a. You may resume regular (light) daily activities beginning the next day--such as daily self-care, walking, climbing stairs--gradually increasing activities as tolerated.  If you can walk 30 minutes without difficulty, it is safe to try more intense activity such as jogging, treadmill, bicycling, low-impact aerobics, etc. b. Refrain from any heavy lifting or straining for the first 2 weeks after your procedure. c. Avoid activities that make your pain worse d. You may drive when you are no longer taking prescription pain medication, you can comfortably wear a seatbelt, and you can safely maneuver your car and apply brakes.  8. FOLLOW UP in our office a. Please call [facility] at [phone] to set up an appointment to see your surgeon in the office for a follow-up appointment approximately 2 weeks after your surgery. b. Make sure that you call for this appointment the day you arrive home to insure a convenient appointment time.  9. If you have disability or family leave forms that need to be completed, you may have them completed by your primary care physician's office; for return to work instructions, please ask our office staff and they will be happy to assist you in obtaining this documentation   When to  call us [phone]: 1. Poor pain control 2. Reactions / problems with new medications (rash/itching, etc)  3. Fever over 101.5 F (38.5 C) 4. Inability to urinate 5. Nausea/vomiting 6. Worsening swelling or bruising 7. Continued bleeding from incision. 8. Increased pain, redness, or drainage from the incision  The clinic staff is available to answer your questions during regular business hours (8:30am-5pm).  Please dont hesitate to call and ask to speak to one of our nurses for clinical concerns.   A surgeon from [facility] is always on call at the hospitals   If you have a medical emergency, go to the nearest emergency room or call 911.   [facility] [address], [address], [place], [place]  [address] ? MAIN: [phone] FAX [phone] [email or web address]

## 2018-07-20 NOTE — Anesthesia Procedure Notes (Signed)
Procedure Name: Intubation Date/Time: 07/20/2018 11:00 AM Performed by: Mechele Claude, CRNA Pre-anesthesia Checklist: Patient identified, Emergency Drugs available, Suction available and Patient being monitored Patient Re-evaluated:Patient Re-evaluated prior to induction Oxygen Delivery Method: Circle system utilized Preoxygenation: Pre-oxygenation with 100% oxygen Induction Type: IV induction Ventilation: Mask ventilation without difficulty Laryngoscope Size: Mac and 3 Grade View: Grade I Tube type: Oral Tube size: 6.0 mm Number of attempts: 1 Airway Equipment and Method: Stylet and Oral airway Placement Confirmation: ETT inserted through vocal cords under direct vision,  positive ETCO2 and breath sounds checked- equal and bilateral Secured at: 21 cm Tube secured with: Tape Dental Injury: Teeth and Oropharynx as per pre-operative assessment

## 2018-07-20 NOTE — Anesthesia Postprocedure Evaluation (Signed)
Anesthesia Post Note  Patient: Kristin Bruce  Procedure(s) Performed: EXCISION OF PILONIDAL DISEASE (N/A Coccyx)     Patient location during evaluation: PACU Anesthesia Type: General Level of consciousness: awake and alert Pain management: pain level controlled Vital Signs Assessment: post-procedure vital signs reviewed and stable Respiratory status: spontaneous breathing, nonlabored ventilation, respiratory function stable and patient connected to nasal cannula oxygen Cardiovascular status: blood pressure returned to baseline and stable Postop Assessment: no apparent nausea or vomiting Anesthetic complications: no    Last Vitals:  Vitals:   07/20/18 1330 07/20/18 1345  BP: 114/80 (!) 128/94  Pulse: 81 90  Resp: 13 10  Temp: 36.8 C   SpO2: 100% 100%    Last Pain:  Vitals:   07/20/18 1330  TempSrc:   PainSc: 5                  Cecile Hearing

## 2018-07-21 ENCOUNTER — Encounter (HOSPITAL_BASED_OUTPATIENT_CLINIC_OR_DEPARTMENT_OTHER): Payer: Self-pay | Admitting: Surgery

## 2018-07-26 ENCOUNTER — Emergency Department (HOSPITAL_COMMUNITY)
Admission: EM | Admit: 2018-07-26 | Discharge: 2018-07-27 | Disposition: A | Discharge status: Home / Self Care | Payer: Medicaid Other | Attending: Emergency Medicine | Admitting: Emergency Medicine

## 2018-07-26 ENCOUNTER — Other Ambulatory Visit: Payer: Self-pay

## 2018-07-26 ENCOUNTER — Encounter (HOSPITAL_COMMUNITY): Payer: Self-pay | Admitting: Emergency Medicine

## 2018-07-26 DIAGNOSIS — Z79899 Other long term (current) drug therapy: Secondary | ICD-10-CM | POA: Diagnosis not present

## 2018-07-26 DIAGNOSIS — L02411 Cutaneous abscess of right axilla: Secondary | ICD-10-CM | POA: Diagnosis not present

## 2018-07-26 DIAGNOSIS — R222 Localized swelling, mass and lump, trunk: Secondary | ICD-10-CM | POA: Diagnosis present

## 2018-07-26 DIAGNOSIS — J45909 Unspecified asthma, uncomplicated: Secondary | ICD-10-CM | POA: Diagnosis not present

## 2018-07-26 MED ORDER — HYDROCODONE-ACETAMINOPHEN 5-325 MG PO TABS
2.0000 | ORAL_TABLET | Freq: Once | ORAL | Status: AC
  Administered 2018-07-26: 2 via ORAL
  Filled 2018-07-26: qty 2

## 2018-07-26 MED ORDER — LIDOCAINE-EPINEPHRINE (PF) 2 %-1:200000 IJ SOLN
10.0000 mL | Freq: Once | INTRAMUSCULAR | Status: AC
  Administered 2018-07-26: 10 mL
  Filled 2018-07-26: qty 20

## 2018-07-26 NOTE — ED Provider Notes (Signed)
[facility]-EMERGENCY DEPT Provider Note   CSN: [id number] Arrival date & time: 07/26/18  1945     History   Chief Complaint Chief Complaint  Patient presents with  . Abscess    right axilla    HPI Kristin Bruce is a 23 y.o. female.  Patient presents to the emergency department with a chief complaint of right axillary abscess.  She states the symptoms started 3 days ago.  She denies any fevers or chills.  She states pain is been gradually worsening.  It now hurts for her to lift her arm.  She has not taken anything for her symptoms.  The history is provided by the patient. No language interpreter was used.    Past Medical History:  Diagnosis Date  . Anemia   . Asthma   . Depression   . Dislocation of symphysis pubis   . History of pre-eclampsia in prior pregnancy, currently pregnant   . HSV (herpes simplex virus) anogenital infection   . Pilonidal disease   . Positive GBS test 03/16/2016  . Pre-eclampsia, severe 03/17/2016  . Pregnancy induced hypertension   . Suicidal thoughts    during pregnancy  . Vaginal delivery 03/17/2016  . Wears glasses     Patient Active Problem List   Diagnosis Date Noted  . Pilonidal abscess 05/13/2018  . Perirectal abscess 03/31/2018  . Asthma affecting pregnancy, antepartum 03/16/2016  . Anemia of pregnancy 01/07/2016  . Adjustment disorder with depressed mood 11/22/2015    Past Surgical History:  Procedure Laterality Date  . INCISION AND DRAINAGE ABSCESS N/A 05/13/2018   Procedure: INCISION AND DRAINAGE, PILONIDAL  ABSCESS;  Surgeon: Jimmye NormanWyatt, James, MD;  Location: [facility] OR;  Service: General;  Laterality: N/A;  . INCISION AND DRAINAGE PERIRECTAL ABSCESS N/A 04/01/2018   Procedure: IRRIGATION AND DEBRIDEMENT PERIRECTAL ABSCESS;  Surgeon: Violeta Gelinashompson, Burke, MD;  Location: [facility];  Service: General;  Laterality: N/A;  . NO PAST SURGERIES    . PILONIDAL CYST EXCISION N/A 07/20/2018   Procedure: EXCISION OF PILONIDAL  DISEASE;  Surgeon: Andria MeuseWhite, Christopher M, MD;  Location: [facility];  Service: General;  Laterality: N/A;     OB History    Gravida  2   Para  1   Term  1   Preterm      AB      Living  1     SAB      TAB      Ectopic      Multiple  0   Live Births  1            Home Medications    Prior to Admission medications   Medication Sig Start Date End Date Taking? Authorizing Provider  acetaminophen (TYLENOL) 500 MG tablet You can take 1000 mg every 6 hours as needed for pain.  Use this is your primary pain control.  You can alternate this with the ibuprofen.  If he is continued to have pain you can use the oxycodone as a last resort. DO NOT TAKE MORE THAN 4000 MG OF TYLENOL PER DAY.  IT CAN HARM YOUR LIVER. 04/02/18  Yes Sherrie GeorgeJennings, Willard, PA-C  ibuprofen (ADVIL,MOTRIN) 200 MG tablet You can take 2 to 3 tablets every 6 hours as needed for pain.  You can alternate this with plain Tylenol, or the oxycodone.  You can buy this over-the-counter without a prescription.  Do not exceed this amount it can cause kidney injury and ulcers. 04/02/18  Yes Sherrie George, PA-C  oxyCODONE (ROXICODONE) 5 MG immediate release tablet Take 1-2 tablets (5-10 mg total) by mouth every 6 (six) hours as needed for up to 7 days (acute postoperative pain not controlled with tylenol and ibuprofen). 07/20/18 07/27/18 Yes Andria Meuse, MD  [facility] 150-35 MCG/24HR transdermal patch Apply 1 patch topically once a week. 03/09/18  Yes [provider]    Family History Family History  Problem Relation Age of Onset  . Diabetes Mother   . Hypertension Mother   . Diabetes Maternal Grandfather     Social History Social History   Tobacco Use  . Smoking status: Never Smoker  . Smokeless tobacco: Never Used  Substance Use Topics  . Alcohol use: Not Currently    Comment: not since pregnancy  . Drug use: No     Allergies   Patient has no known allergies.   Review of  Systems Review of Systems  All other systems reviewed and are negative.    Physical Exam Updated Vital Signs BP 127/90 (BP Location: Left Arm)   Pulse (!) 117   Temp 98.5 F (36.9 C) (Oral)   Resp 17   LMP 07/05/2018 (Exact Date)   SpO2 100%   Physical Exam Vitals signs and nursing note reviewed.  Constitutional:      General: She is not in acute distress.    Appearance: She is not diaphoretic.  HENT:     Head: Normocephalic and atraumatic.  Eyes:     Conjunctiva/sclera: Conjunctivae normal.     Pupils: Pupils are equal, round, and reactive to light.  Neck:     Trachea: No tracheal deviation.  Cardiovascular:     Rate and Rhythm: Normal rate.  Pulmonary:     Effort: Pulmonary effort is normal. No respiratory distress.  Abdominal:     Palpations: Abdomen is soft.  Musculoskeletal: Normal range of motion.  Skin:    General: Skin is warm and dry.     Comments: 1 x 3 cm right axillary abscess without significant erythema or cellulitis  Neurological:     Mental Status: She is alert and oriented to person, place, and time.  Psychiatric:        Judgment: Judgment normal.      ED Treatments / Results  Labs (all labs ordered are listed, but only abnormal results are displayed) Labs Reviewed - No data to display  EKG None  Radiology No results found.  Procedures Procedures (including critical care time) INCISION AND DRAINAGE Performed by: Roxy Horseman Consent: Verbal consent obtained. Risks and benefits: risks, benefits and alternatives were discussed Type: abscess  Body area: right axilla  Anesthesia: local infiltration  Incision was made with a scalpel.  Local anesthetic: lidocaine 1% with epinephrine  Anesthetic total: 4 ml  Complexity: complex Blunt dissection to break up loculations  Drainage: purulent  Drainage amount: copious  Packing material: none  Patient tolerance: Patient tolerated the procedure well with no immediate  complications.    Medications Ordered in ED Medications  HYDROcodone-acetaminophen (NORCO/VICODIN) 5-325 MG per tablet 2 tablet (has no administration in time range)  lidocaine-EPINEPHrine (XYLOCAINE W/EPI) 2 %-1:200000 (PF) injection 10 mL (has no administration in time range)     Initial Impression / Assessment and Plan / ED Course  I have reviewed the triage vital signs and the nursing notes.  Pertinent labs & imaging results that were available during my care of the patient were reviewed by me and considered in my medical decision making (  see chart for details).     Patient with skin abscess amenable to incision and drainage.  Abscess was not large enough to warrant packing or drain,  wound recheck in 2 days. Encouraged home warm soaks and flushing.  Mild signs of cellulitis is surrounding skin.  Will d/c to home.   Final Clinical Impressions(s) / ED Diagnoses   Final diagnoses:  Abscess of axilla, right    ED Discharge Orders         Ordered    doxycycline (VIBRAMYCIN) 100 MG capsule  2 times daily     07/27/18 0022           Roxy Horseman, PA-C 07/27/18 0023    Palumbo, April, MD 07/27/18 [phone]

## 2018-07-26 NOTE — ED Triage Notes (Signed)
Pt report lump in right axilla onset 07/21/2018 very painful pt states unable to raise her right arm d/t pain

## 2018-07-27 MED ORDER — DOXYCYCLINE HYCLATE 100 MG PO CAPS: 100.0000 mg | ORAL_CAPSULE | Freq: Two times a day (BID) | ORAL | 0 refills | Status: DC

## 2018-07-27 NOTE — Discharge Instructions (Addendum)
Do NOT take Doxycycline if PREGNANT or BREASTFEEDING.

## 2018-07-27 NOTE — ED Notes (Signed)
Pt unable to sign for discharge due to downtime. Verbalized understanding of follow up instructions, wound care, and prescriptions.

## 2018-08-26 ENCOUNTER — Emergency Department (HOSPITAL_COMMUNITY)
Admission: EM | Admit: 2018-08-26 | Discharge: 2018-08-26 | Disposition: A | Discharge status: Home / Self Care | Payer: Medicaid Other | Attending: Emergency Medicine | Admitting: Emergency Medicine

## 2018-08-26 ENCOUNTER — Encounter (HOSPITAL_COMMUNITY): Payer: Self-pay

## 2018-08-26 DIAGNOSIS — L989 Disorder of the skin and subcutaneous tissue, unspecified: Secondary | ICD-10-CM | POA: Insufficient documentation

## 2018-08-26 DIAGNOSIS — J45909 Unspecified asthma, uncomplicated: Secondary | ICD-10-CM | POA: Insufficient documentation

## 2018-08-26 DIAGNOSIS — T148XXA Other injury of unspecified body region, initial encounter: Secondary | ICD-10-CM

## 2018-08-26 DIAGNOSIS — Z79899 Other long term (current) drug therapy: Secondary | ICD-10-CM | POA: Diagnosis not present

## 2018-08-26 MED ORDER — SULFAMETHOXAZOLE-TRIMETHOPRIM 800-160 MG PO TABS
1.0000 | ORAL_TABLET | Freq: Once | ORAL | Status: AC
  Administered 2018-08-26: 1 via ORAL
  Filled 2018-08-26: qty 1

## 2018-08-26 MED ORDER — TRAMADOL HCL 50 MG PO TABS
50.0000 mg | ORAL_TABLET | Freq: Once | ORAL | Status: AC
  Administered 2018-08-26: 50 mg via ORAL
  Filled 2018-08-26: qty 1

## 2018-08-26 MED ORDER — TRAMADOL HCL 50 MG PO TABS: 50.0000 mg | ORAL_TABLET | Freq: Four times a day (QID) | ORAL | 0 refills | Status: DC | PRN

## 2018-08-26 MED ORDER — SULFAMETHOXAZOLE-TRIMETHOPRIM 800-160 MG PO TABS: 1.0000 | ORAL_TABLET | Freq: Two times a day (BID) | ORAL | 0 refills | Status: AC

## 2018-08-26 NOTE — ED Notes (Signed)
Patient verbalizes understanding of discharge instructions. Opportunity for questioning and answers were provided. Armband removed by staff, pt discharged from ED.  

## 2018-08-26 NOTE — ED Triage Notes (Signed)
Pt had excision of Pilonidal cyct on 07-20-18.  Pt has been doing well until today.  Pt has been able to control pain with Ibuprofen until last night, pain is worse and bleeding small amount blood and yellow drainage.

## 2018-08-26 NOTE — ED Provider Notes (Signed)
[facility] EMERGENCY DEPARTMENT Provider Note   CSN: [id number] Arrival date & time: 08/26/18  1428     History   Chief Complaint Chief Complaint  Patient presents with  . Post-op Problem    HPI Kristin Bruce is a 23 y.o. female.  HPI Patient presents almost 1 month after elective resection of pilonidal cyst now with concern for pain, drainage about the surgery site. Onset was about 1 week ago, she states that despite of showering, she continues to have issues. She has not spoken with her surgeons. No fever, no nausea, no vomiting, no diarrhea, no abdominal pain, she states that she is otherwise well.    Past Medical History:  Diagnosis Date  . Anemia   . Asthma   . Depression   . Dislocation of symphysis pubis   . History of pre-eclampsia in prior pregnancy, currently pregnant   . HSV (herpes simplex virus) anogenital infection   . Pilonidal disease   . Positive GBS test 03/16/2016  . Pre-eclampsia, severe 03/17/2016  . Pregnancy induced hypertension   . Suicidal thoughts    during pregnancy  . Vaginal delivery 03/17/2016  . Wears glasses     Patient Active Problem List   Diagnosis Date Noted  . Pilonidal abscess 05/13/2018  . Perirectal abscess 03/31/2018  . Asthma affecting pregnancy, antepartum 03/16/2016  . Anemia of pregnancy 01/07/2016  . Adjustment disorder with depressed mood 11/22/2015    Past Surgical History:  Procedure Laterality Date  . INCISION AND DRAINAGE ABSCESS N/A 05/13/2018   Procedure: INCISION AND DRAINAGE, PILONIDAL  ABSCESS;  Surgeon: Jimmye Norman, MD;  Location: [facility] OR;  Service: General;  Laterality: N/A;  . INCISION AND DRAINAGE PERIRECTAL ABSCESS N/A 04/01/2018   Procedure: IRRIGATION AND DEBRIDEMENT PERIRECTAL ABSCESS;  Surgeon: Violeta Gelinas, MD;  Location: [facility] OR;  Service: General;  Laterality: N/A;  . NO PAST SURGERIES    . PILONIDAL CYST EXCISION N/A 07/20/2018   Procedure: EXCISION OF PILONIDAL DISEASE;   Surgeon: Andria Meuse, MD;  Location: [facility];  Service: General;  Laterality: N/A;     OB History    Gravida  2   Para  1   Term  1   Preterm      AB      Living  1     SAB      TAB      Ectopic      Multiple  0   Live Births  1            Home Medications    Prior to Admission medications   Medication Sig Start Date End Date Taking? Authorizing Provider  acetaminophen (TYLENOL) 500 MG tablet You can take 1000 mg every 6 hours as needed for pain.  Use this is your primary pain control.  You can alternate this with the ibuprofen.  If he is continued to have pain you can use the oxycodone as a last resort. DO NOT TAKE MORE THAN 4000 MG OF TYLENOL PER DAY.  IT CAN HARM YOUR LIVER. 04/02/18   Sherrie George, PA-C  doxycycline (VIBRAMYCIN) 100 MG capsule Take 1 capsule (100 mg total) by mouth 2 (two) times daily. 07/27/18   Roxy Horseman, PA-C  ibuprofen (ADVIL,MOTRIN) 200 MG tablet You can take 2 to 3 tablets every 6 hours as needed for pain.  You can alternate this with plain Tylenol, or the oxycodone.  You can buy this over-the-counter without a prescription.  Do not exceed this amount it can cause kidney injury and ulcers. 04/02/18   Sherrie GeorgeJennings, Willard, PA-C  sulfamethoxazole-trimethoprim (BACTRIM DS,SEPTRA DS) 800-160 MG tablet Take 1 tablet by mouth 2 (two) times daily for 7 days. 08/26/18 09/02/18  Gerhard MunchLockwood, Petina Muraski, MD  traMADol (ULTRAM) 50 MG tablet Take 1 tablet (50 mg total) by mouth every 6 (six) hours as needed. 08/26/18   Gerhard MunchLockwood, Roger Kettles, MD  [facility] MedicoXULANE 150-35 MCG/24HR transdermal patch Apply 1 patch topically once a week. 03/09/18   [provider]    Family History Family History  Problem Relation Age of Onset  . Diabetes Mother   . Hypertension Mother   . Diabetes Maternal Grandfather     Social History Social History   Tobacco Use  . Smoking status: Never Smoker  . Smokeless tobacco: Never Used  Substance Use Topics    . Alcohol use: Not Currently    Comment: not since pregnancy  . Drug use: No     Allergies   Patient has no known allergies.   Review of Systems Review of Systems  Constitutional:       Per HPI, otherwise negative  HENT:       Per HPI, otherwise negative  Respiratory:       Per HPI, otherwise negative  Cardiovascular:       Per HPI, otherwise negative  Gastrointestinal: Negative for vomiting.  Endocrine:       Negative aside from HPI  Genitourinary:       Neg aside from HPI   Musculoskeletal:       Per HPI, otherwise negative  Skin: Positive for wound.  Neurological: Negative for syncope.     Physical Exam Updated Vital Signs BP 100/75   Pulse (!) 112   Temp 98.7 F (37.1 C) (Oral)   Resp 20   LMP 08/06/2018   SpO2 98%   Physical Exam Vitals signs and nursing note reviewed.  Constitutional:      General: She is not in acute distress.    Appearance: She is well-developed.  HENT:     Head: Normocephalic and atraumatic.  Eyes:     Conjunctiva/sclera: Conjunctivae normal.  Cardiovascular:     Rate and Rhythm: Normal rate and regular rhythm.  Pulmonary:     Effort: Pulmonary effort is normal. No respiratory distress.     Breath sounds: Normal breath sounds. No stridor.  Abdominal:     General: There is no distension.  Genitourinary:   Skin:    General: Skin is warm and dry.  Neurological:     Mental Status: She is alert and oriented to person, place, and time.     Cranial Nerves: No cranial nerve deficit.      ED Treatments / Results   Procedures Procedures (including critical care time)  Medications Ordered in ED Medications  sulfamethoxazole-trimethoprim (BACTRIM DS,SEPTRA DS) 800-160 MG per tablet 1 tablet (1 tablet Oral Given 08/26/18 1630)  traMADol (ULTRAM) tablet 50 mg (50 mg Oral Given 08/26/18 1630)     Initial Impression / Assessment and Plan / ED Course  I have reviewed the triage vital signs and the nursing notes.  Pertinent  labs & imaging results that were available during my care of the patient were reviewed by me and considered in my medical decision making (see chart for details).  Female presents with open wound, with some mild purulence No evidence for bacteremia, sepsis, no abdominal pain, nausea, vomiting, suspicion for deep space infection. Patient was started on  antibiotics, and after discussion on additional home care, including bathing, cleaning, topical antibiotics, and a discussion on the importance of following up with her surgical team because she was discharged in stable condition.  Final Clinical Impressions(s) / ED Diagnoses   Final diagnoses:  Open wound    ED Discharge Orders         Ordered    sulfamethoxazole-trimethoprim (BACTRIM DS,SEPTRA DS) 800-160 MG tablet  2 times daily     08/26/18 1635    traMADol (ULTRAM) 50 MG tablet  Every 6 hours PRN     08/26/18 1635           Gerhard Munch, MD 08/27/18 [phone]

## 2018-08-26 NOTE — Discharge Instructions (Signed)
As discussed, today's evaluation has been generally reassuring. It is important that you take your antibiotics, as prescribed, and monitor your condition carefully. Please be sure to call your surgeon on Monday to discuss today's evaluation, as well as your progress over the weekend.  For the next days, please use Epson salt baths, 3 times daily, keep your wound clean, dry, dressed with topical antibiotic and gauze. Return here for any concerning changes in your condition.

## 2019-07-31 ENCOUNTER — Ambulatory Visit: Payer: Medicaid Other | Attending: Internal Medicine

## 2019-08-27 ENCOUNTER — Ambulatory Visit: Payer: Medicaid Other | Attending: Internal Medicine

## 2019-08-27 DIAGNOSIS — Z20822 Contact with and (suspected) exposure to covid-19: Secondary | ICD-10-CM

## 2019-08-28 LAB — NOVEL CORONAVIRUS, NAA: SARS-CoV-2, NAA: NOT DETECTED

## 2019-11-26 ENCOUNTER — Emergency Department (HOSPITAL_COMMUNITY): Payer: Worker's Compensation

## 2019-11-26 ENCOUNTER — Other Ambulatory Visit: Payer: Self-pay

## 2019-11-26 ENCOUNTER — Emergency Department (HOSPITAL_COMMUNITY)
Admission: EM | Admit: 2019-11-26 | Discharge: 2019-11-26 | Disposition: A | Discharge status: Home / Self Care | Payer: Worker's Compensation | Attending: Emergency Medicine | Admitting: Emergency Medicine

## 2019-11-26 ENCOUNTER — Encounter (HOSPITAL_COMMUNITY): Payer: Self-pay

## 2019-11-26 DIAGNOSIS — Y929 Unspecified place or not applicable: Secondary | ICD-10-CM | POA: Insufficient documentation

## 2019-11-26 DIAGNOSIS — Y939 Activity, unspecified: Secondary | ICD-10-CM | POA: Insufficient documentation

## 2019-11-26 DIAGNOSIS — W2209XA Striking against other stationary object, initial encounter: Secondary | ICD-10-CM | POA: Diagnosis not present

## 2019-11-26 DIAGNOSIS — S6991XA Unspecified injury of right wrist, hand and finger(s), initial encounter: Secondary | ICD-10-CM | POA: Diagnosis present

## 2019-11-26 DIAGNOSIS — Z79899 Other long term (current) drug therapy: Secondary | ICD-10-CM | POA: Insufficient documentation

## 2019-11-26 DIAGNOSIS — S6010XA Contusion of unspecified finger with damage to nail, initial encounter: Secondary | ICD-10-CM | POA: Diagnosis not present

## 2019-11-26 DIAGNOSIS — Y990 Civilian activity done for income or pay: Secondary | ICD-10-CM | POA: Insufficient documentation

## 2019-11-26 MED ORDER — IBUPROFEN 800 MG PO TABS
800.0000 mg | ORAL_TABLET | Freq: Once | ORAL | Status: AC
  Administered 2019-11-26: 800 mg via ORAL
  Filled 2019-11-26: qty 1

## 2019-11-26 NOTE — Discharge Instructions (Addendum)
As discussed, you may take over the counter ibuprofen or tylenol as needed for pain. Keep splint on as needed for comfort. If symptoms do not improve within the next week, please follow-up with PCP. Your x-ray was negative for any bony fractures. Return to the ER for new or worsening symptoms.

## 2019-11-26 NOTE — ED Triage Notes (Signed)
Patient states she was trying to get a bolt out of a truck and her right hand slipped hitting the truck Patient c/o right pinky pain and right hand pain.  Nail is black.

## 2019-11-26 NOTE — ED Provider Notes (Signed)
[facility]-EMERGENCY DEPT Provider Note   CSN: [id number] Arrival date & time: 11/26/19  1810     History Chief Complaint  Patient presents with  . Finger Injury    Kristin Bruce is a 24 y.o. female with a past medical history significant for anemia, asthma, depression, and hypertension who presents to the ED due to right hand pain that started just prior to arrival.  Patient states she was removing a bolt from a truck where her hand slipped and hit against the vehicle. She admits to pain in her right 3rd-5th finger, worse with movement. No treatment prior to arrival. Notes blood under her 5th fingernail. Denies numbness/tingling. Denies right wrist pain.   History obtained from patient and past medical records. No interpreter used during encounter.      Past Medical History:  Diagnosis Date  . Anemia   . Asthma   . Depression   . Dislocation of symphysis pubis   . History of pre-eclampsia in prior pregnancy, currently pregnant   . HSV (herpes simplex virus) anogenital infection   . Pilonidal disease   . Positive GBS test 03/16/2016  . Pre-eclampsia, severe 03/17/2016  . Pregnancy induced hypertension   . Suicidal thoughts    during pregnancy  . Vaginal delivery 03/17/2016  . Wears glasses     Patient Active Problem List   Diagnosis Date Noted  . Pilonidal abscess 05/13/2018  . Perirectal abscess 03/31/2018  . Asthma affecting pregnancy, antepartum 03/16/2016  . Anemia of pregnancy 01/07/2016  . Adjustment disorder with depressed mood 11/22/2015    Past Surgical History:  Procedure Laterality Date  . INCISION AND DRAINAGE ABSCESS N/A 05/13/2018   Procedure: INCISION AND DRAINAGE, PILONIDAL  ABSCESS;  Surgeon: Jimmye Norman, MD;  Location: [facility] OR;  Service: General;  Laterality: N/A;  . INCISION AND DRAINAGE PERIRECTAL ABSCESS N/A 04/01/2018   Procedure: IRRIGATION AND DEBRIDEMENT PERIRECTAL ABSCESS;  Surgeon: Violeta Gelinas, MD;  Location: [facility] OR;   Service: General;  Laterality: N/A;  . NO PAST SURGERIES    . PILONIDAL CYST EXCISION N/A 07/20/2018   Procedure: EXCISION OF PILONIDAL DISEASE;  Surgeon: Andria Meuse, MD;  Location: [facility];  Service: General;  Laterality: N/A;     OB History    Gravida  2   Para  1   Term  1   Preterm      AB      Living  1     SAB      TAB      Ectopic      Multiple  0   Live Births  1           Family History  Problem Relation Age of Onset  . Diabetes Mother   . Hypertension Mother   . Diabetes Maternal Grandfather     Social History   Tobacco Use  . Smoking status: Never Smoker  . Smokeless tobacco: Never Used  Substance Use Topics  . Alcohol use: Not Currently    Comment: not since pregnancy  . Drug use: No    Home Medications Prior to Admission medications   Medication Sig Start Date End Date Taking? Authorizing Provider  acetaminophen (TYLENOL) 500 MG tablet You can take 1000 mg every 6 hours as needed for pain.  Use this is your primary pain control.  You can alternate this with the ibuprofen.  If he is continued to have pain you can use the oxycodone as a  last resort. DO NOT TAKE MORE THAN 4000 MG OF TYLENOL PER DAY.  IT CAN HARM YOUR LIVER. 04/02/18   Sherrie George, PA-C  doxycycline (VIBRAMYCIN) 100 MG capsule Take 1 capsule (100 mg total) by mouth 2 (two) times daily. 07/27/18   Roxy Horseman, PA-C  ibuprofen (ADVIL,MOTRIN) 200 MG tablet You can take 2 to 3 tablets every 6 hours as needed for pain.  You can alternate this with plain Tylenol, or the oxycodone.  You can buy this over-the-counter without a prescription.  Do not exceed this amount it can cause kidney injury and ulcers. 04/02/18   Sherrie George, PA-C  traMADol (ULTRAM) 50 MG tablet Take 1 tablet (50 mg total) by mouth every 6 (six) hours as needed. 08/26/18   Gerhard Munch, MD  [facility] 150-35 MCG/24HR transdermal patch Apply 1 patch topically once a week. 03/09/18    [provider]    Allergies    Patient has no known allergies.  Review of Systems   Review of Systems  Musculoskeletal: Positive for arthralgias. Negative for joint swelling.  Skin: Positive for color change and wound.  Neurological: Negative for numbness.    Physical Exam Updated Vital Signs BP 130/80 (BP Location: Left Arm)   Pulse 78   Temp 99.2 F (37.3 C) (Oral)   Resp 16   Ht 5' (1.524 m)   Wt 48.5 kg   LMP 11/26/2019   SpO2 100%   BMI 20.90 kg/m   Physical Exam Vitals and nursing note reviewed.  Constitutional:      General: She is not in acute distress.    Appearance: She is not ill-appearing.  HENT:     Head: Normocephalic.  Eyes:     Pupils: Pupils are equal, round, and reactive to light.  Cardiovascular:     Rate and Rhythm: Normal rate and regular rhythm.     Pulses: Normal pulses.     Heart sounds: Normal heart sounds. No murmur. No friction rub. No gallop.   Pulmonary:     Effort: Pulmonary effort is normal.     Breath sounds: Normal breath sounds.  Abdominal:     General: Abdomen is flat. There is no distension.     Palpations: Abdomen is soft.     Tenderness: There is no abdominal tenderness. There is no guarding or rebound.  Musculoskeletal:     Cervical back: Neck supple.     Comments: Tenderness to palpation over dorsal aspect of entire fingers 3-5 on right hand. Limited ROM of right 5th finger due to pain. Full ROM of right wrist. Radial pulse intact. Soft compartments. No surrounding erythema, edema, or warmth.   Skin:    Comments: Subungual hematoma under right 5th fingernail.   Neurological:     General: No focal deficit present.     Mental Status: She is alert.  Psychiatric:        Mood and Affect: Mood normal.        Behavior: Behavior normal.     ED Results / Procedures / Treatments   Labs (all labs ordered are listed, but only abnormal results are displayed) Labs Reviewed - No data to  display  EKG None  Radiology DG Hand Complete Right  Result Date: 11/26/2019 CLINICAL DATA:  Right hand injury. EXAM: RIGHT HAND - COMPLETE 3+ VIEW COMPARISON:  None. FINDINGS: There is no evidence of fracture or dislocation. There is no evidence of arthropathy or other focal bone abnormality. Soft tissues are unremarkable. IMPRESSION: Negative. Electronically  Signed   By: Constance Holster M.D.   On: 11/26/2019 19:17    Procedures Procedures (including critical care time)  Medications Ordered in ED Medications  ibuprofen (ADVIL) tablet 800 mg (800 mg Oral Given 11/26/19 2146)    ED Course  I have reviewed the triage vital signs and the nursing notes.  Pertinent labs & imaging results that were available during my care of the patient were reviewed by me and considered in my medical decision making (see chart for details).    MDM Rules/Calculators/A&P                     24 year old female presents to the ED due to right hand/finger pain after hitting it against a car just prior to arrival. Vitals all within normal limits. Patient in no acute distress and non-ill appearing. RUE neurovascularly intact. Full ROM of right wrist. Limited ROM of right 5th finger due to pain with subungual hematoma. X-ray ordered at triage which was personally reviewed and negative for any bony fractures. Trephination performed with success. Finger placed in splint for comfort. Ibuprofen given for pain management. Instructed patient to take over the counter ibuprofen or tylenol as needed for pain. Advised patient to follow-up with PCP if symptoms do not improve within the next week. Strict ED precautions discussed with patient. Patient states understanding and agrees to plan. Patient discharged home in no acute distress and stable vitals  Final Clinical Impression(s) / ED Diagnoses Final diagnoses:  Injury of finger of right hand, initial encounter  Subungual hematoma of digit of hand, initial encounter     Rx / DC Orders ED Discharge Orders    None       Karie Kirks 11/26/19 2207    Tegeler, Gwenyth Allegra, MD 11/26/19 2348

## 2020-01-20 ENCOUNTER — Emergency Department (HOSPITAL_COMMUNITY)
Admission: EM | Admit: 2020-01-20 | Discharge: 2020-01-20 | Disposition: A | Discharge status: Home / Self Care | Payer: Medicaid Other | Attending: Emergency Medicine | Admitting: Emergency Medicine

## 2020-01-20 ENCOUNTER — Encounter (HOSPITAL_COMMUNITY): Payer: Self-pay

## 2020-01-20 ENCOUNTER — Other Ambulatory Visit: Payer: Self-pay

## 2020-01-20 DIAGNOSIS — R109 Unspecified abdominal pain: Secondary | ICD-10-CM | POA: Insufficient documentation

## 2020-01-20 DIAGNOSIS — R197 Diarrhea, unspecified: Secondary | ICD-10-CM | POA: Diagnosis not present

## 2020-01-20 DIAGNOSIS — R112 Nausea with vomiting, unspecified: Secondary | ICD-10-CM | POA: Diagnosis not present

## 2020-01-20 NOTE — ED Notes (Signed)
Pt called for blood work with no answer. x1

## 2020-01-20 NOTE — ED Triage Notes (Signed)
Pt BIB EMS from home. Pt reports recent change in birth control. Pt states she started her period about an hour ago and now has severe abdominal cramping and N/V/D.

## 2020-02-11 ENCOUNTER — Other Ambulatory Visit: Payer: Self-pay

## 2020-02-11 ENCOUNTER — Emergency Department (HOSPITAL_COMMUNITY)
Admission: EM | Admit: 2020-02-11 | Discharge: 2020-02-12 | Disposition: A | Discharge status: Home / Self Care | Payer: Medicaid Other | Attending: Emergency Medicine | Admitting: Emergency Medicine

## 2020-02-11 ENCOUNTER — Encounter (HOSPITAL_COMMUNITY): Payer: Self-pay | Admitting: *Deleted

## 2020-02-11 DIAGNOSIS — R300 Dysuria: Secondary | ICD-10-CM | POA: Diagnosis not present

## 2020-02-11 DIAGNOSIS — R1030 Lower abdominal pain, unspecified: Secondary | ICD-10-CM | POA: Insufficient documentation

## 2020-02-11 DIAGNOSIS — R109 Unspecified abdominal pain: Secondary | ICD-10-CM | POA: Diagnosis present

## 2020-02-11 DIAGNOSIS — N3001 Acute cystitis with hematuria: Secondary | ICD-10-CM | POA: Insufficient documentation

## 2020-02-11 DIAGNOSIS — J45909 Unspecified asthma, uncomplicated: Secondary | ICD-10-CM | POA: Diagnosis not present

## 2020-02-11 DIAGNOSIS — N83201 Unspecified ovarian cyst, right side: Secondary | ICD-10-CM | POA: Diagnosis not present

## 2020-02-11 DIAGNOSIS — R102 Pelvic and perineal pain: Secondary | ICD-10-CM

## 2020-02-11 LAB — URINALYSIS, ROUTINE W REFLEX MICROSCOPIC
Bilirubin Urine: NEGATIVE
Glucose, UA: NEGATIVE mg/dL
Ketones, ur: 20 mg/dL — AB
Nitrite: NEGATIVE
Protein, ur: 30 mg/dL — AB
RBC / HPF: >50 RBC/hpf — ABNORMAL HIGH (ref 0–5)
Specific Gravity, Urine: 1.019 (ref 1.005–1.030)
WBC, UA: >50 WBC/hpf — ABNORMAL HIGH (ref 0–5)
pH: 7 (ref 5.0–8.0)

## 2020-02-11 LAB — I-STAT BETA HCG BLOOD, ED (MC, WL, AP ONLY): I-stat hCG, quantitative: <5 m[IU]/mL (ref <5)

## 2020-02-11 NOTE — ED Triage Notes (Signed)
Friday went drinking and felt fine.  Then Saturday had hard time urinating and noted blood clots in her urine. LMP 2weeks ago.  Pt denies vaginal bleeding.  Unsure if pregnant.

## 2020-02-12 ENCOUNTER — Emergency Department (HOSPITAL_COMMUNITY): Payer: Medicaid Other

## 2020-02-12 LAB — BASIC METABOLIC PANEL
Anion gap: 10 (ref 5–15)
BUN: 11 mg/dL (ref 6–20)
CO2: 22 mmol/L (ref 22–32)
Calcium: 8.9 mg/dL (ref 8.9–10.3)
Chloride: 105 mmol/L (ref 98–111)
Creatinine, Ser: 0.77 mg/dL (ref 0.44–1.00)
GFR calc Af Amer: >60 mL/min (ref >60)
GFR calc non Af Amer: >60 mL/min (ref >60)
Glucose, Bld: 96 mg/dL (ref 70–99)
Potassium: 3.5 mmol/L (ref 3.5–5.1)
Sodium: 137 mmol/L (ref 135–145)

## 2020-02-12 LAB — CBC WITH DIFFERENTIAL/PLATELET
Abs Immature Granulocytes: 0.02 10*3/uL (ref 0.00–0.07)
Basophils Absolute: 0 10*3/uL (ref 0.0–0.1)
Basophils Relative: 0 %
Eosinophils Absolute: 0.1 10*3/uL (ref 0.0–0.5)
Eosinophils Relative: 1 %
HCT: 37.7 % (ref 36.0–46.0)
Hemoglobin: 11.5 g/dL — ABNORMAL LOW (ref 12.0–15.0)
Immature Granulocytes: 0 %
Lymphocytes Relative: 32 %
Lymphs Abs: 2.9 10*3/uL (ref 0.7–4.0)
MCH: 26.9 pg (ref 26.0–34.0)
MCHC: 30.5 g/dL (ref 30.0–36.0)
MCV: 88.3 fL (ref 80.0–100.0)
Monocytes Absolute: 0.4 10*3/uL (ref 0.1–1.0)
Monocytes Relative: 5 %
Neutro Abs: 5.6 10*3/uL (ref 1.7–7.7)
Neutrophils Relative %: 62 %
Platelets: 357 10*3/uL (ref 150–400)
RBC: 4.27 MIL/uL (ref 3.87–5.11)
RDW: 13.9 % (ref 11.5–15.5)
WBC: 9 10*3/uL (ref 4.0–10.5)
nRBC: 0 % (ref 0.0–0.2)

## 2020-02-12 LAB — WET PREP, GENITAL
Sperm: NONE SEEN
Trich, Wet Prep: NONE SEEN
Yeast Wet Prep HPF POC: NONE SEEN

## 2020-02-12 MED ORDER — CEPHALEXIN 500 MG PO CAPS
500.0000 mg | ORAL_CAPSULE | Freq: Four times a day (QID) | ORAL | 0 refills | Status: AC
Start: 2020-02-12 — End: 2020-02-19

## 2020-02-12 MED ORDER — FENTANYL CITRATE (PF) 100 MCG/2ML IJ SOLN
50.0000 ug | Freq: Once | INTRAMUSCULAR | Status: AC
  Administered 2020-02-12: 50 ug via INTRAVENOUS
  Filled 2020-02-12: qty 2

## 2020-02-12 MED ORDER — SODIUM CHLORIDE 0.9 % IV BOLUS
1000.0000 mL | Freq: Once | INTRAVENOUS | Status: AC
  Administered 2020-02-12: 1000 mL via INTRAVENOUS

## 2020-02-12 MED ORDER — ONDANSETRON HCL 4 MG/2ML IJ SOLN
4.0000 mg | Freq: Once | INTRAMUSCULAR | Status: AC
  Administered 2020-02-12: 4 mg via INTRAVENOUS
  Filled 2020-02-12: qty 2

## 2020-02-12 MED ORDER — KETOROLAC TROMETHAMINE 30 MG/ML IJ SOLN
30.0000 mg | Freq: Once | INTRAMUSCULAR | Status: AC
  Administered 2020-02-12: 30 mg via INTRAVENOUS
  Filled 2020-02-12: qty 1

## 2020-02-12 MED ORDER — NAPROXEN 500 MG PO TABS
500.0000 mg | ORAL_TABLET | Freq: Two times a day (BID) | ORAL | 0 refills | Status: DC | PRN
Start: 2020-02-12 — End: 2020-07-29

## 2020-02-12 MED ORDER — ONDANSETRON 4 MG PO TBDP
4.0000 mg | ORAL_TABLET | Freq: Three times a day (TID) | ORAL | 0 refills | Status: DC | PRN
Start: 2020-02-12 — End: 2020-09-30

## 2020-02-12 MED ORDER — SODIUM CHLORIDE 0.9 % IV SOLN
1.0000 g | Freq: Once | INTRAVENOUS | Status: AC
  Administered 2020-02-12: 1 g via INTRAVENOUS
  Filled 2020-02-12: qty 10

## 2020-02-12 NOTE — ED Provider Notes (Signed)
07:00: Assumed care of patient from Layden PA-C at change of shift pending pelvic ultrasound.  Please see prior provider note for full H&P.  Briefly patient is a 24 year old female who presented to the emergency department with complaints of abdominal pain and hematuria for the past 2 days.   Physical Exam  BP 109/76 (BP Location: Right Arm)    Pulse 63    Temp 98.2 F (36.8 C) (Oral)    Resp 20    LMP 01/20/2020    SpO2 98%   Physical Exam Vitals and nursing note reviewed.  Constitutional:      General: She is not in acute distress.    Appearance: She is well-developed.  HENT:     Head: Normocephalic and atraumatic.  Eyes:     General:        Right eye: No discharge.        Left eye: No discharge.     Conjunctiva/sclera: Conjunctivae normal.  Neurological:     Mental Status: She is alert.     Comments: Clear speech.   Psychiatric:        Behavior: Behavior normal.        Thought Content: Thought content normal.     ED Course/Procedures     Results for orders placed or performed during the hospital encounter of 02/11/20  Wet prep, genital   Specimen: PATH Cytology Cervicovaginal Ancillary Only  Result Value Ref Range   Yeast Wet Prep HPF POC NONE SEEN NONE SEEN   Trich, Wet Prep NONE SEEN NONE SEEN   Clue Cells Wet Prep HPF POC PRESENT (A) NONE SEEN   WBC, Wet Prep HPF POC MODERATE (A) NONE SEEN   Sperm NONE SEEN   Urinalysis, Routine w reflex microscopic- may I&O cath if menses  Result Value Ref Range   Color, Urine YELLOW YELLOW   APPearance HAZY (A) CLEAR   Specific Gravity, Urine 1.019 1.005 - 1.030   pH 7.0 5.0 - 8.0   Glucose, UA NEGATIVE NEGATIVE mg/dL   Hgb urine dipstick LARGE (A) NEGATIVE   Bilirubin Urine NEGATIVE NEGATIVE   Ketones, ur 20 (A) NEGATIVE mg/dL   Protein, ur 30 (A) NEGATIVE mg/dL   Nitrite NEGATIVE NEGATIVE   Leukocytes,Ua MODERATE (A) NEGATIVE   RBC / HPF >50 (H) 0 - 5 RBC/hpf   WBC, UA >50 (H) 0 - 5 WBC/hpf   Bacteria, UA RARE (A)  NONE SEEN   Squamous Epithelial / LPF 0-5 0 - 5  Basic metabolic panel  Result Value Ref Range   Sodium 137 135 - 145 mmol/L   Potassium 3.5 3.5 - 5.1 mmol/L   Chloride 105 98 - 111 mmol/L   CO2 22 22 - 32 mmol/L   Glucose, Bld 96 70 - 99 mg/dL   BUN 11 6 - 20 mg/dL   Creatinine, Ser 7.82 0.44 - 1.00 mg/dL   Calcium 8.9 8.9 - 95.6 mg/dL   GFR calc non Af Amer >60 >60 mL/min   GFR calc Af Amer >60 >60 mL/min   Anion gap 10 5 - 15  CBC with Differential  Result Value Ref Range   WBC 9.0 4.0 - 10.5 K/uL   RBC 4.27 3.87 - 5.11 MIL/uL   Hemoglobin 11.5 (L) 12.0 - 15.0 g/dL   HCT 21.3 36 - 46 %   MCV 88.3 80.0 - 100.0 fL   MCH 26.9 26.0 - 34.0 pg   MCHC 30.5 30.0 - 36.0 g/dL   RDW 13.9  11.5 - 15.5 %   Platelets 357 150 - 400 K/uL   nRBC 0.0 0.0 - 0.2 %   Neutrophils Relative % 62 %   Neutro Abs 5.6 1.7 - 7.7 K/uL   Lymphocytes Relative 32 %   Lymphs Abs 2.9 0.7 - 4.0 K/uL   Monocytes Relative 5 %   Monocytes Absolute 0.4 0 - 1 K/uL   Eosinophils Relative 1 %   Eosinophils Absolute 0.1 0 - 0 K/uL   Basophils Relative 0 %   Basophils Absolute 0.0 0 - 0 K/uL   Immature Granulocytes 0 %   Abs Immature Granulocytes 0.02 0.00 - 0.07 K/uL  I-Stat beta hCG blood, ED  Result Value Ref Range   I-stat hCG, quantitative <5.0 <5 mIU/mL   Comment 3           CT Renal Stone Study  Result Date: 02/12/2020 CLINICAL DATA:  Lower abdominal pain and hematuria EXAM: CT ABDOMEN AND PELVIS WITHOUT CONTRAST TECHNIQUE: Multidetector CT imaging of the abdomen and pelvis was performed following the standard protocol without oral or IV contrast. COMPARISON:  CT pelvis May 13, 2018 FINDINGS: Lower chest: Lung bases are clear. Hepatobiliary: No focal liver lesions are appreciable on this noncontrast enhanced study. Gallbladder wall is not appreciably thickened. There is no biliary duct dilatation. Pancreas: There is no pancreatic mass or inflammatory focus. Spleen: No evident splenic lesions.  Adrenals/Urinary Tract: Adrenals bilaterally appear normal. Kidneys bilaterally show no evident mass or hydronephrosis on either side. No renal or ureteral calculus is appreciable on either side. The urinary bladder is midline with wall thickness borderline prominent. Stomach/Bowel: There is no appreciable bowel wall or mesenteric thickening. There is no evident bowel obstruction. Terminal ileum appears within normal limits. There is no free air or portal venous air. Vascular/Lymphatic: No abdominal aortic aneurysm. No vascular lesion evident on this noncontrast enhanced study. Reproductive: Uterus is anteverted. There is a cyst focal area of decreased attenuation in the right adnexa measuring 2.1 x 1.7 cm which has attenuation values slightly higher than is expected with a simple cyst. No other pelvic mass evident. Other: The appendix appears normal. There is no evident abscess or ascites in the abdomen or pelvis. Musculoskeletal: There is a degree of lumbar scoliosis. No blastic or lytic bone lesions. No intramuscular or abdominal wall lesions are evident. IMPRESSION: 1. Suspect small hemorrhagic cyst right ovary. Pelvic ultrasound correlation with Doppler assessment advised in this regard. No other pelvic mass appreciable on CT. 2. No demonstrable renal or ureteral calculus. No hydronephrosis. Urinary bladder wall thickness is borderline prominent. There potentially may be a degree of underlying cystitis. Correlation with urinalysis advised in this regard. 3. No bowel wall thickening or bowel obstruction. No abscess in the abdomen or pelvis. Appendix appears normal. Electronically Signed   By: Bretta Bang III M.D.   On: 02/12/2020 06:37   US PELVIC COMPLETE W TRANSVAGINAL AND TORSION R/O  Result Date: 02/12/2020 CLINICAL DATA:  Pelvic pain EXAM: TRANSABDOMINAL AND TRANSVAGINAL ULTRASOUND OF PELVIS DOPPLER ULTRASOUND OF OVARIES TECHNIQUE: Study was performed transabdominally to optimize pelvic field of  view evaluation and transvaginally to optimize internal visceral architecture evaluation. Color and duplex Doppler ultrasound was utilized to evaluate blood flow to the ovaries. COMPARISON:  CT abdomen and pelvis February 12, 2020 FINDINGS: Uterus Measurements: 9.7 x 4.0 x 5.2 cm = volume: 106.8 mL. No fibroids or other mass visualized. Endometrium Thickness: 10 mm.  No focal abnormality visualized. Right ovary Measurements: 3.8 x 2.7  x 3.1 cm = volume: 16.7 mL. There is a slightly lobulated apparent cyst arising from the right ovary measuring 3.2 x 1.7 x 2.1 cm. No other extrauterine pelvic mass. Left ovary Measurements: 1.9 x 1.2 x 1.1 cm = volume: 1.3 mL. Normal appearance/no adnexal mass. Pulsed Doppler evaluation of both ovaries demonstrates normal low-resistance arterial and venous waveforms. Other findings Trace free fluid. IMPRESSION: 1. Apparent simple cyst arising from the right ovary measuring 3.2 x 1.7 x 2.1 cm. No other extrauterine pelvic mass. 2. Low resistance Doppler waveform in each ovary. No findings suggesting ovarian torsion on either side. 3. Trace free pelvic fluid may be physiologic or potentially could represent recent ovarian cyst leakage. 4.  Study otherwise unremarkable. Electronically Signed   By: Bretta Bang III M.D.   On: 02/12/2020 09:17   Procedures  MDM   Work-up has been reviewed: CBC: Mild anemia similar to prior.  No leukocytosis. BMP: No electrolyte derangement, renal function preserved. Urinalysis: Concerning for infection. Pregnancy test: Negative Wet prep: Clue cells present, patient without atypical discharge for her, therefore do not feel that she needs treatment for BV at this time.  CT renal stone study with suspected small hemorrhagic right ovarian cyst, recommended Doppler ultrasound. No demonstrable renal or ureteral calculus. No hydronephrosis. Urinary bladder wall thickness is borderline prominent. There potentially may be a degree of underlying  cystitis. Correlation with urinalysis advised in this regard.  Ultrasound with apparent simple cyst arising from the right ovary, no evidence of torsion.   On reassessment patient is feeling much better in the ED.  She feels ready to go home.  She has received analgesics for pain, Zofran for nausea, as well as Rocephin for UTI.  Prior provider has sent in Keflex for her urinary tract infection, we will also provide naproxen and Zofran for symptomatic care regards to her ovarian cyst.  OB/GYN follow-up. I discussed results, treatment plan, need for follow-up, and return precautions with the patient. Provided opportunity for questions, patient confirmed understanding and is in agreement with plan.        Cherly Anderson, PA-C 02/12/20 1010    Virgina Norfolk, DO 02/12/20 1148

## 2020-02-12 NOTE — ED Notes (Signed)
Pt transported to [facility]

## 2020-02-12 NOTE — Discharge Instructions (Addendum)
As we discussed your CT scan does not show any signs of kidney stone. There is mention of a right ovarian cyst which was confirmed on your ultrasound.  We are sending home with naproxen for pain and Zofran for nausea  - Naproxen is a nonsteroidal anti-inflammatory medication that will help with pain and swelling. Be sure to take this medication as prescribed with food, 1 pill every 12 hours,  It should be taken with food, as it can cause stomach upset, and more seriously, stomach bleeding. Do not take other nonsteroidal anti-inflammatory medications with this such as Advil, Motrin, Aleve, Mobic, Goodie Powder, or Motrin.    -Zofran: This is a nausea medication you may take every 8 hours as needed for nausea and vomiting.  Your urine showed findings of a urinary tract infection therefore you have been sent in Keflex as an antibiotic.  Take antibiotics as directed. Please take all of your antibiotics until finished.  We have prescribed you new medication(s) today. Discuss the medications prescribed today with your pharmacist as they can have adverse effects and interactions with your other medicines including over the counter and prescribed medications. Seek medical evaluation if you start to experience new or abnormal symptoms after taking one of these medicines, seek care immediately if you start to experience difficulty breathing, feeling of your throat closing, facial swelling, or rash as these could be indications of a more serious allergic reaction  Make sure you are drinking plenty of fluids and staying hydrated.   Please follow up with obgyn or primary care within 1 week.   Return to the Emergency Dept for any worsening pain in your abdomen, fever, vomiting/inability to eat or drink or any other worsening or concerning symptoms.

## 2020-02-12 NOTE — ED Provider Notes (Addendum)
[facility] EMERGENCY DEPARTMENT Provider Note   CSN: [id number] Arrival date & time: 02/11/20  1518     History Chief Complaint  Patient presents with  . Hematuria  . Back Pain    lower    Kristin Bruce is a 24 y.o. female with PMH/o anemia, depression, HSV, who presents for evaluation of abdominal pain, hematuria that has been ongoing for last 2 days.  She reports that about 2 days ago, she started noticing that whenever she urinated, she would have pressure and pain in her lower abdomen.  She has occasionally had some lower abdominal pain that radiates to her back.  She states that she has also noticed hematuria.  She states that it does not vaginal bleeding and she is not having any other bleeding when she is not urinating.  She has not noted any nausea/vomiting.  She has not measured any fever.  She denies any chest pain, difficulty breathing, vaginal discharge.  She is currently sexually active with 1 partner.  They do not use protection.  The history is provided by the patient.       Past Medical History:  Diagnosis Date  . Anemia   . Asthma   . Depression   . Dislocation of symphysis pubis   . History of pre-eclampsia in prior pregnancy, currently pregnant   . HSV (herpes simplex virus) anogenital infection   . Pilonidal disease   . Positive GBS test 03/16/2016  . Pre-eclampsia, severe 03/17/2016  . Pregnancy induced hypertension   . Suicidal thoughts    during pregnancy  . Vaginal delivery 03/17/2016  . Wears glasses     Patient Active Problem List   Diagnosis Date Noted  . Pilonidal abscess 05/13/2018  . Perirectal abscess 03/31/2018  . Asthma affecting pregnancy, antepartum 03/16/2016  . Anemia of pregnancy 01/07/2016  . Adjustment disorder with depressed mood 11/22/2015    Past Surgical History:  Procedure Laterality Date  . INCISION AND DRAINAGE ABSCESS N/A 05/13/2018   Procedure: INCISION AND DRAINAGE, PILONIDAL  ABSCESS;  Surgeon:  Jimmye Norman, MD;  Location: [facility] OR;  Service: General;  Laterality: N/A;  . INCISION AND DRAINAGE PERIRECTAL ABSCESS N/A 04/01/2018   Procedure: IRRIGATION AND DEBRIDEMENT PERIRECTAL ABSCESS;  Surgeon: Violeta Gelinas, MD;  Location: [facility] OR;  Service: General;  Laterality: N/A;  . NO PAST SURGERIES    . PILONIDAL CYST EXCISION N/A 07/20/2018   Procedure: EXCISION OF PILONIDAL DISEASE;  Surgeon: Andria Meuse, MD;  Location: [facility];  Service: General;  Laterality: N/A;     OB History    Gravida  2   Para  1   Term  1   Preterm      AB      Living  1     SAB      TAB      Ectopic      Multiple  0   Live Births  1           Family History  Problem Relation Age of Onset  . Diabetes Mother   . Hypertension Mother   . Diabetes Maternal Grandfather     Social History   Tobacco Use  . Smoking status: Never Smoker  . Smokeless tobacco: Never Used  Vaping Use  . Vaping Use: Never used  Substance Use Topics  . Alcohol use: Not Currently    Comment: not since pregnancy  . Drug use: Yes    Types: Marijuana  Home Medications Prior to Admission medications   Medication Sig Start Date End Date Taking? Authorizing Provider  Burr Medico 150-35 MCG/24HR transdermal patch Apply 1 patch topically once a week. Tuesdays 03/09/18  Yes [provider]  acetaminophen (TYLENOL) 500 MG tablet You can take 1000 mg every 6 hours as needed for pain.  Use this is your primary pain control.  You can alternate this with the ibuprofen.  If he is continued to have pain you can use the oxycodone as a last resort. DO NOT TAKE MORE THAN 4000 MG OF TYLENOL PER DAY.  IT CAN HARM YOUR LIVER. Patient not taking: Reported on 02/11/2020 04/02/18   Sherrie George, PA-C  cephALEXin (KEFLEX) 500 MG capsule Take 1 capsule (500 mg total) by mouth 4 (four) times daily for 7 days. 02/12/20 02/19/20  Maxwell Caul, PA-C  ibuprofen (ADVIL,MOTRIN) 200 MG tablet You can take 2  to 3 tablets every 6 hours as needed for pain.  You can alternate this with plain Tylenol, or the oxycodone.  You can buy this over-the-counter without a prescription.  Do not exceed this amount it can cause kidney injury and ulcers. Patient not taking: Reported on 02/11/2020 04/02/18   Sherrie George, PA-C  traMADol (ULTRAM) 50 MG tablet Take 1 tablet (50 mg total) by mouth every 6 (six) hours as needed. Patient not taking: Reported on 02/11/2020 08/26/18   Gerhard Munch, MD    Allergies    Patient has no known allergies.  Review of Systems   Review of Systems  Constitutional: Negative for fever.  Respiratory: Negative for cough and shortness of breath.   Cardiovascular: Negative for chest pain.  Gastrointestinal: Positive for abdominal pain. Negative for nausea and vomiting.  Genitourinary: Positive for dysuria and hematuria.  Neurological: Negative for headaches.  All other systems reviewed and are negative.   Physical Exam Updated Vital Signs BP (!) 123/98   Pulse 70   Temp 98.2 F (36.8 C) (Oral)   Resp 18   Ht 5' (1.524 m)   Wt 46.3 kg   LMP 01/20/2020   SpO2 100%   BMI 19.92 kg/m   Physical Exam Vitals and nursing note reviewed. Exam conducted with a chaperone present.  Constitutional:      Appearance: Normal appearance. She is well-developed.  HENT:     Head: Normocephalic and atraumatic.  Eyes:     General: Lids are normal.     Conjunctiva/sclera: Conjunctivae normal.     Pupils: Pupils are equal, round, and reactive to light.  Cardiovascular:     Rate and Rhythm: Normal rate and regular rhythm.     Pulses: Normal pulses.     Heart sounds: Normal heart sounds. No murmur heard.  No friction rub. No gallop.   Pulmonary:     Effort: Pulmonary effort is normal.     Breath sounds: Normal breath sounds.  Abdominal:     Palpations: Abdomen is soft. Abdomen is not rigid.     Tenderness: There is abdominal tenderness in the suprapubic area. There is no right CVA  tenderness, left CVA tenderness or guarding.     Comments: Abdomen soft, nondistended.  Tenderness palpation in the suprapubic region.  No rigidity, guarding.  No CVA tenderness noted on exam.  Genitourinary:    Vagina: Vaginal discharge present.     Cervix: No friability or erythema.     Adnexa:        Right: No mass or tenderness.  Left: No mass or tenderness.       Comments: The exam was performed with a chaperone present. Normal external female genitalia. No lesions, rash, or sores.  Thick white discharge in vaginal vault.  No obvious discharge from the cervix.  No cervical erythema.  No CMT.  No adnexal mass or tenderness noted bilaterally. Musculoskeletal:        General: Normal range of motion.     Cervical back: Full passive range of motion without pain.  Skin:    General: Skin is warm and dry.     Capillary Refill: Capillary refill takes less than 2 seconds.  Neurological:     Mental Status: She is alert and oriented to person, place, and time.  Psychiatric:        Speech: Speech normal.     ED Results / Procedures / Treatments   Labs (all labs ordered are listed, but only abnormal results are displayed) Labs Reviewed  URINALYSIS, ROUTINE W REFLEX MICROSCOPIC - Abnormal; Notable for the following components:      Result Value   APPearance HAZY (*)    Hgb urine dipstick LARGE (*)    Ketones, ur 20 (*)    Protein, ur 30 (*)    Leukocytes,Ua MODERATE (*)    RBC / HPF >50 (*)    WBC, UA >50 (*)    Bacteria, UA RARE (*)    All other components within normal limits  CBC WITH DIFFERENTIAL/PLATELET - Abnormal; Notable for the following components:   Hemoglobin 11.5 (*)    All other components within normal limits  URINE CULTURE  WET PREP, GENITAL  BASIC METABOLIC PANEL  I-STAT BETA HCG BLOOD, ED ([facility], [facility], [facility] ONLY)  GC/CHLAMYDIA PROBE AMP ([place]) NOT AT [facility]    EKG None  Radiology CT Renal Stone Study  Result Date: 02/12/2020 CLINICAL DATA:  Lower  abdominal pain and hematuria EXAM: CT ABDOMEN AND PELVIS WITHOUT CONTRAST TECHNIQUE: Multidetector CT imaging of the abdomen and pelvis was performed following the standard protocol without oral or IV contrast. COMPARISON:  CT pelvis May 13, 2018 FINDINGS: Lower chest: Lung bases are clear. Hepatobiliary: No focal liver lesions are appreciable on this noncontrast enhanced study. Gallbladder wall is not appreciably thickened. There is no biliary duct dilatation. Pancreas: There is no pancreatic mass or inflammatory focus. Spleen: No evident splenic lesions. Adrenals/Urinary Tract: Adrenals bilaterally appear normal. Kidneys bilaterally show no evident mass or hydronephrosis on either side. No renal or ureteral calculus is appreciable on either side. The urinary bladder is midline with wall thickness borderline prominent. Stomach/Bowel: There is no appreciable bowel wall or mesenteric thickening. There is no evident bowel obstruction. Terminal ileum appears within normal limits. There is no free air or portal venous air. Vascular/Lymphatic: No abdominal aortic aneurysm. No vascular lesion evident on this noncontrast enhanced study. Reproductive: Uterus is anteverted. There is a cyst focal area of decreased attenuation in the right adnexa measuring 2.1 x 1.7 cm which has attenuation values slightly higher than is expected with a simple cyst. No other pelvic mass evident. Other: The appendix appears normal. There is no evident abscess or ascites in the abdomen or pelvis. Musculoskeletal: There is a degree of lumbar scoliosis. No blastic or lytic bone lesions. No intramuscular or abdominal wall lesions are evident. IMPRESSION: 1. Suspect small hemorrhagic cyst right ovary. Pelvic ultrasound correlation with Doppler assessment advised in this regard. No other pelvic mass appreciable on CT. 2. No demonstrable renal or ureteral calculus. No  hydronephrosis. Urinary bladder wall thickness is borderline prominent. There  potentially may be a degree of underlying cystitis. Correlation with urinalysis advised in this regard. 3. No bowel wall thickening or bowel obstruction. No abscess in the abdomen or pelvis. Appendix appears normal. Electronically Signed   By: Bretta BangWilliam  Woodruff III M.D.   On: 02/12/2020 06:37    Procedures Procedures (including critical care time)  Medications Ordered in ED Medications  sodium chloride 0.9 % bolus 1,000 mL (0 mLs Intravenous Stopped 02/12/20 0707)  ondansetron (ZOFRAN) injection 4 mg (4 mg Intravenous Given 02/12/20 0508)  fentaNYL (SUBLIMAZE) injection 50 mcg (50 mcg Intravenous Given 02/12/20 0509)  cefTRIAXone (ROCEPHIN) 1 g in sodium chloride 0.9 % 100 mL IVPB (0 g Intravenous Stopped 02/12/20 0707)  ketorolac (TORADOL) 30 MG/ML injection 30 mg (30 mg Intravenous Given 02/12/20 0715)    ED Course  I have reviewed the triage vital signs and the nursing notes.  Pertinent labs & imaging results that were available during my care of the patient were reviewed by me and considered in my medical decision making (see chart for details).    MDM Rules/Calculators/A&P                          24 year old female who presents for evaluation of abdominal pain, hematuria this been ongoing for last 2 days.  She also reports some lower back pain.  No associated fevers, vomiting.  She is not having any vaginal bleeding.  On initial ED arrival, she is afebrile, nontoxic-appearing.  Vital signs are stable.  On exam, she has tenderness noted to suprapubic region.  Urine ordered at triage.  Consider GI etiology versus infectious etiology.  I-STAT beta negative.  UA shows large hemoglobin, moderate leukocytes, pyuria, bacteria.  Concern for UTI.  We will plan to treat.  Patient with no known drug allergies.  Given that she has had abdominal tenderness and flank pain, concern for pyelonephritis versus infected stone.  We will plan to check basic labs, CT renal study.  CBC shows no leukocytosis.  Hgb is 11.5. BMP shows normal BUN and Cr.   CT scan shows no evidence of kidney stone or hydronephrosis.  There is urinary or wall thickness which could be a degree of underlying cystitis.  There is mention of small hemorrhagic cyst noted in the right ovary.  Recommend pelvic ultrasound with Doppler assessment for evaluation of torsion.  Reevaluation.  Patient is resting comfortably in bed with no signs of distress.  She reports improvement in pain.  Discussed with her regarding CT findings.  Patient amenable to ultrasound here in the ED.  Pelvic as documented above.  No CMT that would concerning for PID.  No adnexal mass or tenderness noted.  She does have some thick white discharge in the vaginal vault.  [facility] pending U/S. If negative, patient discharged home with antibiotics for UTI.   Portions of this note were generated with Scientist, clinical (histocompatibility and immunogenetics)Dragon dictation software. Dictation errors may occur despite best attempts at proofreading.   Final Clinical Impression(s) / ED Diagnoses Final diagnoses:  Acute cystitis with hematuria  Right ovarian cyst    Rx / DC Orders ED Discharge Orders         Ordered    cephALEXin (KEFLEX) 500 MG capsule  4 times daily     Discontinue  Reprint     02/12/20 0610  Maxwell Caul, PA-C 02/12/20 0708    Maxwell Caul, PA-C 02/12/20 7579    Glynn Octave, MD 02/12/20 [phone]

## 2020-02-13 LAB — GC/CHLAMYDIA PROBE AMP (~~LOC~~) NOT AT ARMC
Chlamydia: NEGATIVE
Comment: NEGATIVE
Comment: NORMAL
Neisseria Gonorrhea: NEGATIVE

## 2020-02-14 LAB — URINE CULTURE: Culture: >=100000 — AB

## 2020-02-15 ENCOUNTER — Telehealth: Payer: Self-pay

## 2020-02-15 NOTE — Telephone Encounter (Signed)
Post ED Visit - Positive Culture Follow-up  Culture report reviewed by antimicrobial stewardship pharmacist: [facility] []  , Pharm.D. []  Enzo Bi, Pharm.D., BCPS AQ-ID []  , Pharm.D., BCPS []  Celedonio Miyamoto, .D., BCPS []  [place], .D., BCPS, AAHIVP []  Georgina Pillion, Pharm.D., BCPS, AAHIVP [x]  [address], PharmD, BCPS []  , PharmD, BCPS []  [place], PharmD, BCPS []  [address], PharmD []  , PharmD, BCPS []  Estella Husk, PharmD  Pharmacy Team []  Lysle Pearl, PharmD []  , PharmD []  Phillips Climes, PharmD []  , Rph []  Agapito Games) , PharmD []  Verlan Friends, PharmD []  , PharmD []  Mervyn Gay, PharmD []  , PharmD []  Vinnie Level, PharmD []  [facility], PharmD []  , PharmD []  Len Childs, PharmD   Positive urine culture Treated with Cephalexin, organism sensitive to the same and no further patient follow-up is required at this time.  02/15/2020, 9:23 AM

## 2020-07-19 NOTE — L&D Delivery Note (Addendum)
Delivery Note Kristin Bruce is a 25 y.o. F1T0211 at [redacted]w[redacted]d admitted for PPROM.   GBS Status: negative NEGATIVE/-- (10/12 0621) Maximum Maternal Temperature: 98.4  Labor course: Initial SVE: 1.5/50/-2. Augmentation with: Pitocin. She then progressed to complete.  ROM: 9h 81m with clear/pink-tinged fluid  Birth: At 1426 a viable female was delivered via spontaneous vaginal delivery (Presentation: cephlaic; LOA). Nuchal cord present: No. Shoulders and body delivered in usual fashion. Infant placed directly on mom's abdomen for bonding/skin-to-skin, baby dried and stimulated. Cord clamped x 2 after 1 minute and cut by FOB-Jaylen. Cord blood collected. The placenta separated spontaneously and delivered via gentle cord traction.  Pitocin infused rapidly IV per protocol. Given admission Hgb was 8.4 IV TXA was also given for prophylaxis. Fundus firm with massage.  Placenta inspected and appears to be intact with a 3 VC.  Placenta/Cord with the following complications: none.  Cord pH: n/a Sponge and instrument count were correct x2.  Intrapartum complications:  None Anesthesia:  epidural Episiotomy: none Lacerations:  none Suture Repair:  n/a EBL (mL): 50 ml   Infant: APGAR (1 MIN): 8  APGAR (5 MINS): 9  Infant weight: pending  Mom to postpartum.  Baby to Couplet care / Skin to Skin. Placenta to L&D   Plans to Bottlefeed Contraception:  Patch Circumcision: N/A  Note sent to [facility]: [facility] for pp visit.    Brand Males, CNM 04/29/2021 2:41 PM

## 2020-07-29 ENCOUNTER — Other Ambulatory Visit: Payer: Self-pay

## 2020-07-29 ENCOUNTER — Inpatient Hospital Stay (HOSPITAL_COMMUNITY)
Admission: AD | Admit: 2020-07-29 | Discharge: 2020-07-29 | Disposition: A | Discharge status: Home / Self Care | Payer: Medicaid Other | Attending: Family Medicine | Admitting: Family Medicine

## 2020-07-29 ENCOUNTER — Encounter (HOSPITAL_COMMUNITY): Payer: Self-pay | Admitting: Family Medicine

## 2020-07-29 DIAGNOSIS — N898 Other specified noninflammatory disorders of vagina: Secondary | ICD-10-CM | POA: Diagnosis not present

## 2020-07-29 DIAGNOSIS — Z3A00 Weeks of gestation of pregnancy not specified: Secondary | ICD-10-CM

## 2020-07-29 DIAGNOSIS — O26891 Other specified pregnancy related conditions, first trimester: Secondary | ICD-10-CM | POA: Insufficient documentation

## 2020-07-29 DIAGNOSIS — O3680X0 Pregnancy with inconclusive fetal viability, not applicable or unspecified: Secondary | ICD-10-CM

## 2020-07-29 DIAGNOSIS — B9689 Other specified bacterial agents as the cause of diseases classified elsewhere: Secondary | ICD-10-CM

## 2020-07-29 DIAGNOSIS — O26899 Other specified pregnancy related conditions, unspecified trimester: Secondary | ICD-10-CM | POA: Diagnosis not present

## 2020-07-29 DIAGNOSIS — Z3A01 Less than 8 weeks gestation of pregnancy: Secondary | ICD-10-CM | POA: Diagnosis not present

## 2020-07-29 LAB — URINALYSIS, ROUTINE W REFLEX MICROSCOPIC
Bacteria, UA: NONE SEEN
Bilirubin Urine: NEGATIVE
Glucose, UA: NEGATIVE mg/dL
Hgb urine dipstick: NEGATIVE
Ketones, ur: 5 mg/dL — AB
Leukocytes,Ua: NEGATIVE
Nitrite: NEGATIVE
Protein, ur: 30 mg/dL — AB
Specific Gravity, Urine: 1.028 (ref 1.005–1.030)
pH: 5 (ref 5.0–8.0)

## 2020-07-29 LAB — HCG, QUANTITATIVE, PREGNANCY: hCG, Beta Chain, Quant, S: 5376 m[IU]/mL — ABNORMAL HIGH (ref <5)

## 2020-07-29 LAB — WET PREP, GENITAL
Sperm: NONE SEEN
Trich, Wet Prep: NONE SEEN
Yeast Wet Prep HPF POC: NONE SEEN

## 2020-07-29 LAB — POCT PREGNANCY, URINE: Preg Test, Ur: POSITIVE — AB

## 2020-07-29 MED ORDER — METRONIDAZOLE 500 MG PO TABS
500.0000 mg | ORAL_TABLET | Freq: Two times a day (BID) | ORAL | 0 refills | Status: DC
Start: 2020-07-29 — End: 2020-08-12

## 2020-07-29 NOTE — MAU Provider Note (Signed)
Event Date/Time   First Provider Initiated Contact with Patient 07/29/20 1024      S Ms. Kristin Bruce is a 25 y.o. G3P1001 patient who presents to MAU today with complaint of resolved abdominal cramping and vaginal discharge.   Patient states she was having cramping a few days ago but none currently.  She reports she thinks she may be pregnant, but has not taken a pregnancy test.  She also reports vaginal discharge and thinks she may have a yeast infection.  Patient states she was suppose to receive care at [facility], but they will not take her [employer] insurance.   O BP 117/89 (BP Location: Right Arm)   Pulse 90   Temp 99.2 F (37.3 C) (Oral)   Resp 20   Ht 5' (1.524 m)   Wt 49.3 kg   LMP 06/25/2020   SpO2 100%   BMI 21.23 kg/m    Results for orders placed or performed during the hospital encounter of 07/29/20 (from the past 24 hour(s))  hCG, quantitative, pregnancy     Status: Abnormal   Collection Time: 07/29/20 10:13 AM  Result Value Ref Range   hCG, Beta Chain, Quant, S 5,376 (H) <5 mIU/mL  Urinalysis, Routine w reflex microscopic Urine, Clean Catch     Status: Abnormal   Collection Time: 07/29/20 10:17 AM  Result Value Ref Range   Color, Urine YELLOW YELLOW   APPearance HAZY (A) CLEAR   Specific Gravity, Urine 1.028 1.005 - 1.030   pH 5.0 5.0 - 8.0   Glucose, UA NEGATIVE NEGATIVE mg/dL   Hgb urine dipstick NEGATIVE NEGATIVE   Bilirubin Urine NEGATIVE NEGATIVE   Ketones, ur 5 (A) NEGATIVE mg/dL   Protein, ur 30 (A) NEGATIVE mg/dL   Nitrite NEGATIVE NEGATIVE   Leukocytes,Ua NEGATIVE NEGATIVE   RBC / HPF 0-5 0 - 5 RBC/hpf   WBC, UA 0-5 0 - 5 WBC/hpf   Bacteria, UA NONE SEEN NONE SEEN   Squamous Epithelial / LPF 0-5 0 - 5   Mucus PRESENT   Wet prep, genital     Status: Abnormal   Collection Time: 07/29/20 10:17 AM   Specimen: PATH Cytology Cervicovaginal Ancillary Only  Result Value Ref Range   Yeast Wet Prep HPF POC NONE SEEN NONE SEEN   Trich, Wet Prep NONE SEEN  NONE SEEN   Clue Cells Wet Prep HPF POC PRESENT (A) NONE SEEN   WBC, Wet Prep HPF POC FEW (A) NONE SEEN   Sperm NONE SEEN   Pregnancy, urine POC     Status: Abnormal   Collection Time: 07/29/20 10:28 AM  Result Value Ref Range   Preg Test, Ur POSITIVE (A) NEGATIVE    Physical Exam Constitutional:      Appearance: Normal appearance.  HENT:     Head: Normocephalic and atraumatic.  Eyes:     Conjunctiva/sclera: Conjunctivae normal.  Cardiovascular:     Rate and Rhythm: Normal rate.  Pulmonary:     Effort: Pulmonary effort is normal. No respiratory distress.  Musculoskeletal:        General: Normal range of motion.     Cervical back: Normal range of motion.  Neurological:     Mental Status: She is alert and oriented to person, place, and time.  Psychiatric:        Mood and Affect: Mood normal.        Behavior: Behavior normal.        Thought Content: Thought content normal.  A Medical screening exam complete Vaginal Discharge + UPT  P Informed of positive pregnancy test today. Discussed testing including hCG and STD/I. Reviewed follow up and treatment once results return. Patient agreeable to plan of care. Will send mychart message.  Discharge from MAU in stable condition  Gerrit Heck, [place] 07/29/2020 10:25 AM   Addendum 2:31 PM Results return as above. Rx for Metronidazole sent to pharmacy on file.  Will have patient return to MAU in 48 hours for repeat hCG and [place]. Patient called and POC reviewed.  Patient agreeable and without questions or concerns.  Cherre Robins MSN, CNM Advanced Practice Provider, [facility] [employer]

## 2020-07-29 NOTE — Discharge Instructions (Signed)
Human Chorionic Gonadotropin Test Why am I having this test? A human chorionic gonadotropin (hCG) test is done to determine whether you are pregnant. It can also be used:  To diagnose an abnormal pregnancy.  To determine whether you have had a miscarriage or are at risk of one. What is being tested? This test checks the level of the human chorionic gonadotropin (hCG) hormone in the blood. This hormone is produced during pregnancy by the cells that form the placenta. The placenta is the organ that grows inside your uterus to nourish a developing baby. When you are pregnant, hCG can be detected in your blood or urine 7 to 8 days before your missed period. The amount of hCG continues to increase for the first 8-10 weeks of pregnancy. The presence of hCG in your blood can be measured with different types of tests. You may have:  A urine test. ? A urine test only shows whether there is hCG in your urine. It does not measure how much.  A qualitative blood test. ? This blood test only shows whether there is hCG in your blood. It does not measure how much.  A quantitative blood test. ? This type of blood test measures the amount of hCG in your blood. ? You may have this test to:  Diagnose an abnormal pregnancy.  Check whether you have had a miscarriage.  Determine whether you are at risk of a miscarriage.  Determine if treatment of an ectopic pregnancy is successful. What kind of sample is taken? Two kinds of samples may be collected to test for the hCG hormone.  Blood. It is usually collected by inserting a needle into a blood vessel.  Urine. It is usually collected by urinating into a germ-free (sterile) specimen cup.      How do I prepare for this test? No preparation is needed for a blood test.  Some preparation is needed for a urine test:  For best results, collect the sample the first time you urinate in the morning. That is when the concentration of hCG is highest.  Do not  drink too much fluid. Drink as you normally would, or as directed by your health care provider. Tell a health care provider about:  All medicines you are taking, including vitamins, herbs, eye drops, creams, and over-the-counter medicines.  Any blood in your urine. This may interfere with the result. How are the results reported? Depending on the type of test that you have, your test results may be reported as values. Your health care provider will compare your results to normal ranges that were established after testing a large group of people (reference ranges). Reference ranges may vary among labs and hospitals. For this test, common reference ranges that show absence of pregnancy are:  Quantitative hCG blood levels: less than 5 IU/L. Other results will be reported as either positive or negative. For this test, normal results (meaning the absence of pregnancy) are:  Negative for hCG in the urine test.  Negative for hCG in the qualitative blood test. What do the results mean? Urine and qualitative blood test  A negative result could mean: ? That you are not pregnant. ? That the test was done too early in your pregnancy to detect hCG in your blood or urine. If you still have other signs of pregnancy, the test will be repeated.  A positive result means: ? That you are most likely pregnant. Your health care provider may confirm your pregnancy with an ultrasound of   your uterus, if needed. Quantitative blood test Results of the quantitative hCG blood test will be reported as values. These values will be interpreted by your health care provider along with your medical history and symptoms you are experiencing. Results outside of expected ranges could mean that:  You are pregnant with twins.  You have abnormal growths in your uterus.  You have an ectopic pregnancy.  You may be experiencing a miscarriage. Talk with your health care provider about what your results mean. Questions to ask  your health care provider Ask your health care provider, or the department that is doing the test:  When will my results be ready?  How will I get my results?  What are my treatment options?  What other tests do I need?  What are my next steps? Summary  A human chorionic gonadotropin (hCG) test is done to determine whether you are pregnant.  When you are pregnant, hCG can be detected in your blood or urine 7 to 8 days before your missed period. HCG levels continue to go up for the first 8-10 weeks of pregnancy.  Your hCG level can be measured with different types of tests. You may have a urine test, a qualitative blood test, or a quantitative blood test.  Talk with your health care provider about what your test results mean. This information is not intended to replace advice given to you by your health care provider. Make sure you discuss any questions you have with your health care provider. Document Revised: 04/07/2020 Document Reviewed: 04/07/2020 Elsevier Patient Education  2021 [employer].  

## 2020-07-29 NOTE — MAU Note (Addendum)
Presents with c/o abdominal cramping, missed period, and possible yeast infection.  Reports currently not cramping.  No VB.  LMP 06/25/2020.

## 2020-07-31 LAB — GC/CHLAMYDIA PROBE AMP (~~LOC~~) NOT AT ARMC
Chlamydia: NEGATIVE
Comment: NEGATIVE
Comment: NORMAL
Neisseria Gonorrhea: NEGATIVE

## 2020-08-12 ENCOUNTER — Other Ambulatory Visit: Payer: Self-pay

## 2020-08-12 ENCOUNTER — Inpatient Hospital Stay (HOSPITAL_COMMUNITY): Payer: Medicaid Other

## 2020-08-12 ENCOUNTER — Inpatient Hospital Stay (HOSPITAL_COMMUNITY)
Admission: AD | Admit: 2020-08-12 | Discharge: 2020-08-12 | Disposition: A | Discharge status: Home / Self Care | Payer: Medicaid Other | Attending: Obstetrics & Gynecology | Admitting: Obstetrics & Gynecology

## 2020-08-12 ENCOUNTER — Encounter (HOSPITAL_COMMUNITY): Payer: Self-pay | Admitting: Obstetrics & Gynecology

## 2020-08-12 DIAGNOSIS — M549 Dorsalgia, unspecified: Secondary | ICD-10-CM | POA: Diagnosis not present

## 2020-08-12 DIAGNOSIS — Z3A01 Less than 8 weeks gestation of pregnancy: Secondary | ICD-10-CM | POA: Diagnosis not present

## 2020-08-12 DIAGNOSIS — O4691 Antepartum hemorrhage, unspecified, first trimester: Secondary | ICD-10-CM

## 2020-08-12 DIAGNOSIS — O200 Threatened abortion: Secondary | ICD-10-CM | POA: Diagnosis not present

## 2020-08-12 DIAGNOSIS — R109 Unspecified abdominal pain: Secondary | ICD-10-CM | POA: Insufficient documentation

## 2020-08-12 DIAGNOSIS — O26891 Other specified pregnancy related conditions, first trimester: Secondary | ICD-10-CM

## 2020-08-12 DIAGNOSIS — O26851 Spotting complicating pregnancy, first trimester: Secondary | ICD-10-CM | POA: Insufficient documentation

## 2020-08-12 DIAGNOSIS — O4690 Antepartum hemorrhage, unspecified, unspecified trimester: Secondary | ICD-10-CM

## 2020-08-12 LAB — URINALYSIS, ROUTINE W REFLEX MICROSCOPIC
Bilirubin Urine: NEGATIVE
Glucose, UA: NEGATIVE mg/dL
Ketones, ur: NEGATIVE mg/dL
Leukocytes,Ua: NEGATIVE
Nitrite: NEGATIVE
Protein, ur: 30 mg/dL — AB
Specific Gravity, Urine: 1.026 (ref 1.005–1.030)
pH: 6 (ref 5.0–8.0)

## 2020-08-12 LAB — CBC
HCT: 32.9 % — ABNORMAL LOW (ref 36.0–46.0)
Hemoglobin: 10.7 g/dL — ABNORMAL LOW (ref 12.0–15.0)
MCH: 28.2 pg (ref 26.0–34.0)
MCHC: 32.5 g/dL (ref 30.0–36.0)
MCV: 86.8 fL (ref 80.0–100.0)
Platelets: 305 10*3/uL (ref 150–400)
RBC: 3.79 MIL/uL — ABNORMAL LOW (ref 3.87–5.11)
RDW: 14.1 % (ref 11.5–15.5)
WBC: 6.2 10*3/uL (ref 4.0–10.5)
nRBC: 0 % (ref 0.0–0.2)

## 2020-08-12 LAB — HCG, QUANTITATIVE, PREGNANCY: hCG, Beta Chain, Quant, S: 11260 m[IU]/mL — ABNORMAL HIGH (ref <5)

## 2020-08-12 MED ORDER — METRONIDAZOLE 500 MG PO TABS: 500.0000 mg | ORAL_TABLET | Freq: Two times a day (BID) | ORAL | 0 refills | Status: DC

## 2020-08-12 MED ORDER — CYCLOBENZAPRINE HCL 5 MG PO TABS
10.0000 mg | ORAL_TABLET | Freq: Once | ORAL | Status: AC
  Administered 2020-08-12: 10 mg via ORAL
  Filled 2020-08-12: qty 2

## 2020-08-12 NOTE — MAU Note (Signed)
Patient reports she started having vaginal bleeding since yesterday, has started seeing blood clots today.  Also having some abdominal cramping and back pain since yesterday.  Patient was supposed to return after last visit for hcg/ultrasound but was unable to return and they haven't called her to reschedule.

## 2020-08-12 NOTE — Discharge Instructions (Signed)
Threatened Miscarriage °A threatened miscarriage occurs when a woman has vaginal bleeding during the first 20 weeks of pregnancy but the pregnancy has not ended. If vaginal bleeding occurs during this time, the health care provider will do tests to make sure the woman is still pregnant. The woman's condition may be considered a threatened miscarriage if the tests show: °· That she is still pregnant. °· That the embryo or unborn baby (fetus) inside the uterus is still growing. °A threatened miscarriage does not mean your pregnancy will end, but it does increase the risk of losing your pregnancy (miscarriage). °What are the causes? °The cause of this condition is usually not known. °What increases the risk? °The following factors may make a pregnant woman more likely to have a miscarriage: °Certain medical conditions °· Conditions that affect the hormone balance in the body, such as thyroid disease or polycystic ovary syndrome. °· Diabetes. °· Autoimmune disorders. °· Infections. °· Bleeding disorders. °· Obesity. °Lifestyle factors °· Using products with tobacco or nicotine or being exposed to tobacco smoke. °· Having alcohol. °· Having large amounts of caffeine. °· Recreational drug use. °Problems with reproductive organs or structures °· Cervical insufficiency. This is when the the lowest part of the uterus (cervix) opens and thins before pregnancy is at term. °· Having a condition called Asherman syndrome, which causes scarring in the uterus or causes the uterus to be abnormal in structure. °· Fibrous growths, called fibroids, in the uterus. °· Congenital abnormalities. These problems are present at birth. °· Infection of the cervix or uterus. °Personal or medical history °· Injury (trauma). °· Having had a miscarriage before. °· Being younger than age 18 or older than age 35. °· Exposure to harmful substances in the environment. This may include radiation or heavy metals, such as lead. °· Using certain  medicines. °What are the signs or symptoms? °Symptoms of this condition include: °· Vaginal bleeding or spotting, with or without cramps or pain. °· Mild pain or cramps in your abdomen. °How is this diagnosed? °You may have tests to check whether you are still pregnant. These tests will be done if you have bleeding, with or without pain, in your abdomen before the 20th week of pregnancy. These tests include: °· Ultrasound. °· A physical exam. °· Measurement of your baby's heart rate. °· Lab tests, such as blood tests, urine tests, or swabs for infection. °You may be diagnosed with a threatened miscarriage if: °· Ultrasound testing shows that you are still pregnant. °· Your baby's heart rate is strong. °· A physical exam shows that your cervix is closed. °· Blood tests confirm that you are still pregnant.   °How is this treated? °No treatments have been shown to prevent a threatened miscarriage from going on to a complete miscarriage. However, the right home care is important. °Follow these instructions at home: °· Get plenty of rest. °· Do not have sex, douche, or put anything in your vagina, such as tampons, until your health care provider says it is okay. °· Do not smoke or use recreational drugs. °· Do not drink alcohol. °· Avoid caffeine. °· Keep all follow-up prenatal visits. This is important. °Contact a health care provider if: °· You have light vaginal bleeding or spotting while pregnant. °· You have pain or cramping in your abdomen. °· You have a fever. °Get help right away if: °· Heavy bleeding soaks through 2 large sanitary pads an hour for more than 2 hours. °· Blood clots come out of   your vagina. °· Tissue comes out of your vagina. °· You leak fluid, or you have a gush of fluid from your vagina. °· You have severe low back pain or cramps in your abdomen. °· You have a fever, chills, and severe pain in the abdomen. °Summary °· A threatened miscarriage occurs when a woman bleeds from the vagina during the  first 20 weeks of pregnancy but the pregnancy has not ended. °· The cause of a threatened miscarriage is usually not known. °· Symptoms of this condition may include vaginal bleeding and mild pain or cramps in your abdomen. °· No treatments have been shown to prevent a threatened miscarriage from going on to a complete miscarriage. °· Keep all follow-up prenatal visits. This is important. °This information is not intended to replace advice given to you by your health care provider. Make sure you discuss any questions you have with your health care provider. °Document Revised: 01/04/2020 Document Reviewed: 01/04/2020 °Elsevier Patient Education © 2021 [employer] ° °

## 2020-08-12 NOTE — MAU Provider Note (Signed)
History     CSN: [id number]  Arrival date and time: 08/12/20 1204   Event Date/Time   First Provider Initiated Contact with Patient 08/12/20 1300      Chief Complaint  Patient presents with  . Vaginal Bleeding  . Abdominal Pain   Kristin Bruce is a 25 y.o. G3P1001 at [redacted]w[redacted]d who has not established PNC.  She presents today for Vaginal Bleeding and Abdominal Pain.  She states she started bleeding yesterday.  She reports it was initially "spotty" and she noticed a blood clot last night and this morning.  She describes the blood clots as "smaller than a pea."  She states she came in today with the onset of back pain which also started yesterday, but is constant.  She reports taking tylenol with some relief of symptoms.  Patient denies aggravating factors regarding the pain.  She also endorses abdominal pain and states it is intermittent in nature.  She reports it is "not like a period, but my back pain is."  She reports sexual activity 2 days ago that was without pain or discomfort.    OB History    Gravida  3   Para  1   Term  1   Preterm      AB      Living  1     SAB      IAB      Ectopic      Multiple  0   Live Births  1           Past Medical History:  Diagnosis Date  . Anemia   . Asthma   . Depression   . Dislocation of symphysis pubis   . History of pre-eclampsia in prior pregnancy, currently pregnant   . HSV (herpes simplex virus) anogenital infection   . Pilonidal disease   . Positive GBS test 03/16/2016  . Pre-eclampsia, severe 03/17/2016  . Pregnancy induced hypertension   . Suicidal thoughts    during pregnancy  . Vaginal delivery 03/17/2016  . Wears glasses     Past Surgical History:  Procedure Laterality Date  . INCISION AND DRAINAGE ABSCESS N/A 05/13/2018   Procedure: INCISION AND DRAINAGE, PILONIDAL  ABSCESS;  Surgeon: Jimmye Norman, MD;  Location: [facility] OR;  Service: General;  Laterality: N/A;  . INCISION AND DRAINAGE PERIRECTAL ABSCESS N/A  04/01/2018   Procedure: IRRIGATION AND DEBRIDEMENT PERIRECTAL ABSCESS;  Surgeon: Violeta Gelinas, MD;  Location: [facility] OR;  Service: General;  Laterality: N/A;  . NO PAST SURGERIES    . PILONIDAL CYST EXCISION N/A 07/20/2018   Procedure: EXCISION OF PILONIDAL DISEASE;  Surgeon: Andria Meuse, MD;  Location: [facility];  Service: General;  Laterality: N/A;    Family History  Problem Relation Age of Onset  . Diabetes Mother   . Hypertension Mother   . Diabetes Maternal Grandfather     Social History   Tobacco Use  . Smoking status: Never Smoker  . Smokeless tobacco: Never Used  Vaping Use  . Vaping Use: Never used  Substance Use Topics  . Alcohol use: Not Currently    Comment: not since pregnancy  . Drug use: Yes    Types: Marijuana    Allergies: No Known Allergies  Medications Prior to Admission  Medication Sig Dispense Refill Last Dose  . acetaminophen (TYLENOL) 500 MG tablet Take 1,000 mg by mouth every 6 (six) hours as needed.   08/12/2020 at 0900  . metroNIDAZOLE (FLAGYL) 500 MG  tablet Take 1 tablet (500 mg total) by mouth 2 (two) times daily. 14 tablet 0   . ondansetron (ZOFRAN ODT) 4 MG disintegrating tablet Take 1 tablet (4 mg total) by mouth every 8 (eight) hours as needed for nausea or vomiting. 5 tablet 0     Review of Systems  Gastrointestinal: Negative for constipation and diarrhea.  Genitourinary: Positive for vaginal bleeding. Negative for difficulty urinating, dysuria and vaginal discharge.  Musculoskeletal: Positive for back pain.   Physical Exam   Blood pressure 107/70, pulse 92, temperature 98.5 F (36.9 C), resp. rate 17, weight 50.5 kg, last menstrual period 06/25/2020, unknown if currently breastfeeding.  Physical Exam Vitals reviewed. Exam conducted with a chaperone present.  Constitutional:      Appearance: Normal appearance. She is well-developed.  HENT:     Head: Normocephalic and atraumatic.  Eyes:     Conjunctiva/sclera:  Conjunctivae normal.  Cardiovascular:     Rate and Rhythm: Normal rate and regular rhythm.  Pulmonary:     Effort: Pulmonary effort is normal.     Breath sounds: Normal breath sounds.  Abdominal:     General: Abdomen is flat.     Palpations: Abdomen is soft.     Tenderness: There is no abdominal tenderness.  Genitourinary:    Comments: Speculum Exam: -Normal External Genitalia: Non tender, no apparent discharge, but blood noted at introitus.  -Vaginal Vault: Pink mucosa with good rugae. Mod amt blood noted and removed with faux swab x4.  Grape sized clot removed with ring forceps from vault and placed in specimen cup. -Cervix:Pink, no lesions, cysts, or polyps.  Appears closed. Scant bleeding from os -Bimanual Exam: Deferred  Musculoskeletal:        General: Normal range of motion.  Skin:    General: Skin is warm and dry.  Neurological:     Mental Status: She is alert and oriented to person, place, and time.  Psychiatric:        Mood and Affect: Mood normal.        Behavior: Behavior normal.        Thought Content: Thought content normal.     MAU Course  Procedures Results for orders placed or performed during the hospital encounter of 08/12/20 (from the past 24 hour(s))  Urinalysis, Routine w reflex microscopic Urine, Clean Catch     Status: Abnormal   Collection Time: 08/12/20 12:31 PM  Result Value Ref Range   Color, Urine YELLOW YELLOW   APPearance HAZY (A) CLEAR   Specific Gravity, Urine 1.026 1.005 - 1.030   pH 6.0 5.0 - 8.0   Glucose, UA NEGATIVE NEGATIVE mg/dL   Hgb urine dipstick LARGE (A) NEGATIVE   Bilirubin Urine NEGATIVE NEGATIVE   Ketones, ur NEGATIVE NEGATIVE mg/dL   Protein, ur 30 (A) NEGATIVE mg/dL   Nitrite NEGATIVE NEGATIVE   Leukocytes,Ua NEGATIVE NEGATIVE   RBC / HPF 21-50 0 - 5 RBC/hpf   WBC, UA 0-5 0 - 5 WBC/hpf   Bacteria, UA RARE (A) NONE SEEN   Squamous Epithelial / LPF 11-20 0 - 5   Mucus PRESENT   CBC     Status: Abnormal   Collection  Time: 08/12/20  1:22 PM  Result Value Ref Range   WBC 6.2 4.0 - 10.5 K/uL   RBC 3.79 (L) 3.87 - 5.11 MIL/uL   Hemoglobin 10.7 (L) 12.0 - 15.0 g/dL   HCT 42.8 (L) 76.8 - 11.5 %   MCV 86.8 80.0 - 100.0 fL  MCH 28.2 26.0 - 34.0 pg   MCHC 32.5 30.0 - 36.0 g/dL   RDW 40.9 81.1 - 91.4 %   Platelets 305 150 - 400 K/uL   nRBC 0.0 0.0 - 0.2 %  ABO/Rh     Status: None   Collection Time: 08/12/20  1:22 PM  Result Value Ref Range   ABO/RH(D)      O POS Performed at [facility], [address], [place], [place] [address]   hCG, quantitative, pregnancy     Status: Abnormal   Collection Time: 08/12/20  2:32 PM  Result Value Ref Range   hCG, Beta Chain, Quant, S 11,260 (H) <5 mIU/mL    MDM Pelvic Exam Labs: UA, UPT, CBC, hCG, ABO Ultrasound Pain Medication Consult Assessment and Plan  25 year old G3P1001 at 6.6 weeks Vaginal Bleeding Back Pain  -POC Reviewed. -Exam performed and findings discussed. -Cultures deferred d/t recent testing. -Informed that vaginal contents are suspicious for POCs and will be sent to pathology for further assessment.  -Patient offered and accepts pain medication. -Will give flexeril for c/o back pain. -Labs ordered. -Will send for [place] and await results.   Cherre Robins 08/12/2020, 1:00 PM   Reassessment (2:40 PM) -[place] returns with no identifiable IUP. -Provider to bedside to discuss results.  -hCG pending and patient given option of discharge with provider call regarding POC. -Patient cautioned that findings are highly suspicious for AB, but no definitive diagnosis could be given. -Bleeding Precautions given. -Patient reports no relief with flexeril dosing, but declines other medications. -Encouraged to call or return to MAU if symptoms worsen or with the onset of new symptoms. -Discharged to home.  Cherre Robins MSN, CNM Advanced Practice Provider, Center for [facility]   Addendum (4:20 PM)  -hCG returns at 11,260 -Dr. Manus Rudd consulted and informed of patient status, results, findings, and plan for repeat hCG in 48 hours.  Agrees with plan without modifications.  -Patient called regarding results and need for follow up. -Instructed to return to MAU on Thursday Jan 27th at 1430. -Patient questions if pathology results have returned and informed that these can take up to one week. -Patient without further questions or concerns.  Cherre Robins MSN, CNM Advanced Practice Provider, Center for [employer]

## 2020-08-13 LAB — ABO/RH: ABO/RH(D): O POS

## 2020-08-14 LAB — SURGICAL PATHOLOGY

## 2020-08-17 ENCOUNTER — Telehealth: Payer: Self-pay | Admitting: Women's Health

## 2020-08-17 DIAGNOSIS — O039 Complete or unspecified spontaneous abortion without complication: Secondary | ICD-10-CM

## 2020-08-17 NOTE — Telephone Encounter (Signed)
Patient called and identified by two identifiers. Patient called because she was to come for repeat hCG on 08/14/2020, but did not come for repeat hCG at MAU that day. Patient reports she did not come because once her surgical pathology results arrived and showed products of conception, she was told she did not need to come to the hospital for another blood draw. No note seen in patient's chart to reflect this conversation. Patient reports she does not have f/u scheduled with OB/GYN and she was previously being seen at [facility], but they no longer take her insurance. Patient scheduled for non-stat hCG on 08/19/2020 at [facility]. Appointment scheduled with patient while on phone to ensure good time for patient. Message also sent to [facility] to schedule patient around 08/26/2020 for provider visit and repeat hCG for SAB f/u. Patient asked if she has any questions. Patient reports that she is taking the miscarriage "really hard." Patient denies SI/HI at this time and reports good support at home. Patient would like to speak with someone regarding the miscarriage to process her feelings. Patient does not have any additional questions at this time. Patient verbalizes understanding of next appointments and patient understands she can schedule appointments at [facility] when she is there on Tuesday for her blood draw if not contacted prior.  Marylen Ponto, NP  10:04 AM 08/17/2020

## 2020-08-19 ENCOUNTER — Other Ambulatory Visit: Payer: Self-pay | Admitting: General Practice

## 2020-08-19 ENCOUNTER — Other Ambulatory Visit: Payer: Medicaid Other

## 2020-08-19 DIAGNOSIS — O039 Complete or unspecified spontaneous abortion without complication: Secondary | ICD-10-CM

## 2020-08-26 NOTE — BH Specialist Note (Deleted)
Pt did not arrive to video visit and did not answer the phone; Unable to leave voice message; left MyChart message for patient.   

## 2020-09-02 ENCOUNTER — Encounter: Payer: Medicaid Other | Admitting: Clinical

## 2020-09-13 ENCOUNTER — Other Ambulatory Visit: Payer: Self-pay

## 2020-09-13 ENCOUNTER — Inpatient Hospital Stay (HOSPITAL_COMMUNITY)
Admission: AD | Admit: 2020-09-13 | Discharge: 2020-09-13 | Disposition: A | Discharge status: Home / Self Care | Payer: Medicaid Other | Attending: Obstetrics and Gynecology | Admitting: Obstetrics and Gynecology

## 2020-09-13 DIAGNOSIS — O039 Complete or unspecified spontaneous abortion without complication: Secondary | ICD-10-CM | POA: Insufficient documentation

## 2020-09-13 LAB — HCG, QUANTITATIVE, PREGNANCY: hCG, Beta Chain, Quant, S: 1306 m[IU]/mL — ABNORMAL HIGH (ref <5)

## 2020-09-13 NOTE — MAU Provider Note (Signed)
History     CSN: [id number]  Arrival date and time: 09/13/20 1159   None     Chief Complaint  Patient presents with  . HCG Level   HPI  Patient is a G3P1011 who presents for repeat HCG level. Patient was seen back in January for vaginal bleeding. At that time she had a work up including an HCG level of 11,000. Speculum exam demonstrated what appeared to be POC in vaginal vault - this was sent to pathology and was confirmed to be a miscarriage. She did not follow up for a repeat HCG level after pathology resulted.   Presents today for repeat HCG. Feeling fine. No symptoms, no vaginal bleeding, has not resumed periods. She has been sexually active.   OB History    Gravida  3   Para  1   Term  1   Preterm      AB      Living  1     SAB      IAB      Ectopic      Multiple  0   Live Births  1           Past Medical History:  Diagnosis Date  . Anemia   . Asthma   . Depression   . Dislocation of symphysis pubis   . History of pre-eclampsia in prior pregnancy, currently pregnant   . HSV (herpes simplex virus) anogenital infection   . Pilonidal disease   . Positive GBS test 03/16/2016  . Pre-eclampsia, severe 03/17/2016  . Pregnancy induced hypertension   . Suicidal thoughts    during pregnancy  . Vaginal delivery 03/17/2016  . Wears glasses     Past Surgical History:  Procedure Laterality Date  . INCISION AND DRAINAGE ABSCESS N/A 05/13/2018   Procedure: INCISION AND DRAINAGE, PILONIDAL  ABSCESS;  Surgeon: Jimmye Norman, MD;  Location: [facility] OR;  Service: General;  Laterality: N/A;  . INCISION AND DRAINAGE PERIRECTAL ABSCESS N/A 04/01/2018   Procedure: IRRIGATION AND DEBRIDEMENT PERIRECTAL ABSCESS;  Surgeon: Violeta Gelinas, MD;  Location: [facility] OR;  Service: General;  Laterality: N/A;  . NO PAST SURGERIES    . PILONIDAL CYST EXCISION N/A 07/20/2018   Procedure: EXCISION OF PILONIDAL DISEASE;  Surgeon: Andria Meuse, MD;  [facility];  Service: General;  Laterality: N/A;    Family History  Problem Relation Age of Onset  . Diabetes Mother   . Hypertension Mother   . Diabetes Maternal Grandfather     Social History   Tobacco Use  . Smoking status: Never Smoker  . Smokeless tobacco: Never Used  Vaping Use  . Vaping Use: Never used  Substance Use Topics  . Alcohol use: Not Currently    Comment: not since pregnancy  . Drug use: Yes    Types: Marijuana    Allergies: No Known Allergies  Medications Prior to Admission  Medication Sig Dispense Refill Last Dose  . acetaminophen (TYLENOL) 500 MG tablet Take 1,000 mg by mouth every 6 (six) hours as needed.     . metroNIDAZOLE (FLAGYL) 500 MG tablet Take 1 tablet (500 mg total) by mouth 2 (two) times daily. 14 tablet 0   . ondansetron (ZOFRAN ODT) 4 MG disintegrating tablet Take 1 tablet (4 mg total) by mouth every 8 (eight) hours as needed for nausea or vomiting. 5 tablet 0     Review of Systems  Constitutional: Negative.   Gastrointestinal: Negative for constipation and  diarrhea.  Genitourinary: Negative for difficulty urinating, dyspareunia and dysuria.  Neurological: Negative for dizziness.   Physical Exam   Blood pressure 105/63, pulse 81, temperature 98.6 F (37 C), temperature source Oral, resp. rate 18, last menstrual period 06/25/2020, SpO2 100 %, unknown if currently breastfeeding.  Physical Exam Vitals and nursing note reviewed.  Constitutional:      Appearance: Normal appearance.  Cardiovascular:     Rate and Rhythm: Normal rate.  Pulmonary:     Effort: Pulmonary effort is normal.  Abdominal:     General: Bowel sounds are normal.     Palpations: Abdomen is soft.  Neurological:     Mental Status: She is alert.  Psychiatric:        Mood and Affect: Mood normal.        Behavior: Behavior normal.     MAU Course  Procedures  MDM Pt evaluated at bedside HCG level 1306 (had been 11,260 on 1/25) Counseled on return precautions,  will follow up for [facility] home   Assessment and Plan   Complete Abortion  -continue trending HCG, follow up in one week. Message sent for scheduling. -stable for DC home   Gita Kudo 09/13/2020, 1:07 PM

## 2020-09-13 NOTE — Discharge Instructions (Signed)
Please follow up at the [facility], located at [address] [place], [place] [address]  They will call you for a follow up.

## 2020-09-13 NOTE — MAU Note (Signed)
Presents for HCG level.  States was supposed to f/u 2 weeks ago but didn't have any transportation, came when she could.

## 2020-09-15 ENCOUNTER — Telehealth: Payer: Self-pay | Admitting: Family Medicine

## 2020-09-15 NOTE — Telephone Encounter (Signed)
LVM for pt to call about lab appts and to discuss [employer] ins[employer]

## 2020-09-19 ENCOUNTER — Other Ambulatory Visit: Payer: Medicaid Other

## 2020-09-19 ENCOUNTER — Other Ambulatory Visit: Payer: Self-pay

## 2020-09-19 DIAGNOSIS — O039 Complete or unspecified spontaneous abortion without complication: Secondary | ICD-10-CM

## 2020-09-20 LAB — BETA HCG QUANT (REF LAB): hCG Quant: 11646 m[IU]/mL

## 2020-09-21 ENCOUNTER — Telehealth: Payer: Medicaid Other | Admitting: Family

## 2020-09-21 ENCOUNTER — Encounter: Payer: Self-pay | Admitting: Family

## 2020-09-21 DIAGNOSIS — E349 Endocrine disorder, unspecified: Secondary | ICD-10-CM

## 2020-09-21 NOTE — Progress Notes (Signed)
   Virtual Visit via telephone Note Due to COVID-19 pandemic this visit was conducted virtually. This visit type was conducted due to national recommendations for restrictions regarding the COVID-19 Pandemic (e.g. social distancing, sheltering in place) in an effort to limit this patient's exposure and mitigate transmission in our community. All issues noted in this document were discussed and addressed.  A physical exam was not performed with this format.  I connected with [name] on 09/21/20 at 9:19 AM  by telephone and verified that I am speaking with the correct person using two identifiers. [name] is currently located at home  and no one is currently with her during visit. The provider, Jannifer Rodney, FNP is located in their office at time of visit.  I discussed the limitations, risks, security and privacy concerns of performing an evaluation and management service by telephone and the availability of in person appointments. I also discussed with the patient that there may be a patient responsible charge related to this service. The patient expressed understanding and agreed to proceed.   History and Present Illness:  HPI  Pt presents to take with questions about her miscarriage. She states she went to the ED 08/12/20 with vaginal bleeding and was diagnosed with a miscarriage. She reports she has 1-2 weeks of bleeding. She has her hCG levels drawn on 09/13/20 and her levels were 1,306. This was thought to be still from her miscarriage and told to repeat levels. She repeated these on 09/19/20 and her hCG was 11,646. She is not having any bleeding or abdominal pain. Denies any fever.   She has an appointment with GYN on 09/26/20.   She reports she has unprotected sex on 09/01/20.  Review of Systems  All other systems reviewed and are negative.    Observations/Objective: No SOB or distress   Assessment and Plan: 1. Elevated serum hCG Pt to call GYN office on Monday and  make appointment  Recommend her to start taking prenatal vitamins  Avoid alcohol, OTC medications Safe sex     I discussed the assessment and treatment plan with the patient. The patient was provided an opportunity to ask questions and all were answered. The patient agreed with the plan and demonstrated an understanding of the instructions.   The patient was advised to call back or seek an in-person evaluation if the symptoms worsen or if the condition fails to improve as anticipated.  The above assessment and management plan was discussed with the patient. The patient verbalized understanding of and has agreed to the management plan. Patient is aware to call the clinic if symptoms persist or worsen. Patient is aware when to return to the clinic for a follow-up visit. Patient educated on when it is appropriate to go to the emergency department.   Time call ended:  9:30 AM   I provided 11 minutes of -face-to-face time during this encounter.    Jannifer Rodney, FNP

## 2020-09-23 ENCOUNTER — Other Ambulatory Visit: Payer: Self-pay | Admitting: Lactation Services

## 2020-09-23 ENCOUNTER — Telehealth: Payer: Self-pay

## 2020-09-23 DIAGNOSIS — O3680X0 Pregnancy with inconclusive fetal viability, not applicable or unspecified: Secondary | ICD-10-CM

## 2020-09-23 DIAGNOSIS — O039 Complete or unspecified spontaneous abortion without complication: Secondary | ICD-10-CM

## 2020-09-23 NOTE — Telephone Encounter (Signed)
Per Gerrit Heck, CNM if pt can be called about results and to determine if possibly this is a new pregnancy as pt was to f/u from a SAB in January and did not show.  Reviewed chart with Dr. Crissie Reese who recommends that pt is scheduled for an U/S at the earliest appt available.  Called pt and informed pt that her recent results showed that her pregnancy hormone level has increased and that this could potentially be a new pregnancy.  I informed her that the provider is recommending an U/S which has been scheduled for 09/30/20 @ 0900.  Pt asked if she needed to still come in on Friday for the lab draw.  I advised pt to continue to come in on Friday for her beta draw so that we can compare the two results and then she will have her U/S on 09/30/20.  Pt verbalized understanding with no further questions.   Addison Naegeli, RN  09/23/20

## 2020-09-26 ENCOUNTER — Other Ambulatory Visit: Payer: Self-pay

## 2020-09-26 ENCOUNTER — Other Ambulatory Visit: Payer: Medicaid Other

## 2020-09-30 ENCOUNTER — Ambulatory Visit (INDEPENDENT_AMBULATORY_CARE_PROVIDER_SITE_OTHER): Payer: Medicaid Other | Admitting: Advanced Practice Midwife

## 2020-09-30 ENCOUNTER — Ambulatory Visit
Admission: RE | Admit: 2020-09-30 | Discharge: 2020-09-30 | Disposition: A | Discharge status: Home / Self Care | Payer: Medicaid Other | Source: Ambulatory Visit | Attending: Family Medicine | Admitting: Family Medicine

## 2020-09-30 ENCOUNTER — Other Ambulatory Visit: Payer: Self-pay

## 2020-09-30 VITALS — BP 114/76 | HR 78 | Wt 114.2 lb

## 2020-09-30 DIAGNOSIS — O3680X0 Pregnancy with inconclusive fetal viability, not applicable or unspecified: Secondary | ICD-10-CM | POA: Diagnosis not present

## 2020-09-30 DIAGNOSIS — Z3A01 Less than 8 weeks gestation of pregnancy: Secondary | ICD-10-CM | POA: Diagnosis not present

## 2020-09-30 DIAGNOSIS — O468X1 Other antepartum hemorrhage, first trimester: Secondary | ICD-10-CM | POA: Diagnosis not present

## 2020-09-30 DIAGNOSIS — O418X10 Other specified disorders of amniotic fluid and membranes, first trimester, not applicable or unspecified: Secondary | ICD-10-CM

## 2020-09-30 DIAGNOSIS — Z8759 Personal history of other complications of pregnancy, childbirth and the puerperium: Secondary | ICD-10-CM | POA: Insufficient documentation

## 2020-09-30 DIAGNOSIS — Z3490 Encounter for supervision of normal pregnancy, unspecified, unspecified trimester: Secondary | ICD-10-CM

## 2020-09-30 NOTE — Patient Instructions (Signed)
Subchorionic Hematoma  A hematoma is a collection of blood outside of the blood vessels. A subchorionic hematoma is a collection of blood between the outer wall of the embryo (chorion) and the inner wall of the uterus. This condition can cause vaginal bleeding. Early small hematomas usually shrink on their own and do not affect your baby or pregnancy. When bleeding starts later in pregnancy, or if the hematoma is larger or occurs in older pregnant women, the condition may be more serious. Larger hematomas increase the chances of miscarriage. This condition also increases the risk of:  Premature separation of the placenta from the uterus.  Premature (preterm) labor.  Stillbirth. What are the causes? The exact cause of this condition is not known. It occurs when blood is trapped between the placenta and the uterine wall because the placenta has separated from the original site of implantation. What increases the risk? You are more likely to develop this condition if:  You were treated with fertility medicines.  You became pregnant through in vitro fertilization (IVF). What are the signs or symptoms? Symptoms of this condition include:  Vaginal spotting or bleeding.  Abdominal pain. This is rare. Sometimes you may have no symptoms and the bleeding may only be seen when ultrasound images are taken (transvaginal ultrasound). How is this diagnosed? This condition is diagnosed based on a physical exam. This includes a pelvic exam. You may also have other tests, including:  Blood tests.  Urine tests.  Ultrasound of the abdomen. How is this treated? Treatment for this condition can vary. Treatment may include:  Watchful waiting. You will be monitored closely for any changes in bleeding.  Medicines.  Activity restriction. This may be needed until the bleeding stops.  A medicine called Rh immunoglobulin. This is given if you have an Rh-negative blood type. It prevents Rh  sensitization. Follow these instructions at home:  Stay on bed rest if told to do so by your health care provider.  Do not lift anything that is heavier than 10 lb (4.5 kg), or the limit that you are told by your health care provider.  Track and write down the number of pads you use each day and how soaked (saturated) they are.  Do not use tampons.  Keep all follow-up visits. This is important. Your health care provider may ask you to have follow-up blood tests or ultrasound tests or both. Contact a health care provider if:  You have any vaginal bleeding.  You have a fever. Get help right away if:  You have severe cramps in your stomach, back, abdomen, or pelvis.  You pass large clots or tissue. Save any tissue for your health care provider to look at.  You faint.  You become light-headed or weak. Summary  A subchorionic hematoma is a collection of blood between the outer wall of the embryo (chorion) and the inner wall of the uterus.  This condition can cause vaginal bleeding.  Sometimes you may have no symptoms and the bleeding may only be seen when ultrasound images are taken.  Treatment may include watchful waiting, medicines, or activity restriction.  Keep all follow-up visits. Get help right away if you have severe cramps or heavy vaginal bleeding. This information is not intended to replace advice given to you by your health care provider. Make sure you discuss any questions you have with your health care provider. Document Revised: 03/31/2020 Document Reviewed: 03/31/2020 Elsevier Patient Education  2021 Elsevier Inc.   

## 2020-09-30 NOTE — Progress Notes (Signed)
Ultrasounds Results Note  SUBJECTIVE HPI:  Ms. Kristin Bruce is a 25 y.o. B5M0802 at [redacted]w[redacted]d by LMP who presents to the [facility] for followup ultrasound results. The patient denies abdominal pain or vaginal bleeding. Upon review of the patient's records, viable IUP at [redacted]w[redacted]d.  Last seen in MAU on 09/13/2020. BHCG was 11,646 on 09/19/2020.  Repeat ultrasound was performed earlier today.   Past Medical History:  Diagnosis Date  . Anemia   . Asthma   . Depression   . Dislocation of symphysis pubis   . History of pre-eclampsia in prior pregnancy, currently pregnant   . HSV (herpes simplex virus) anogenital infection   . Pilonidal disease   . Positive GBS test 03/16/2016  . Pre-eclampsia, severe 03/17/2016  . Pregnancy induced hypertension   . Suicidal thoughts    during pregnancy  . Vaginal delivery 03/17/2016  . Wears glasses    Past Surgical History:  Procedure Laterality Date  . INCISION AND DRAINAGE ABSCESS N/A 05/13/2018   Procedure: INCISION AND DRAINAGE, PILONIDAL  ABSCESS;  Surgeon: Jimmye Norman, MD;  Location: [facility] OR;  Service: General;  Laterality: N/A;  . INCISION AND DRAINAGE PERIRECTAL ABSCESS N/A 04/01/2018   Procedure: IRRIGATION AND DEBRIDEMENT PERIRECTAL ABSCESS;  Surgeon: Violeta Gelinas, MD;  Location: [facility] OR;  Service: General;  Laterality: N/A;  . NO PAST SURGERIES    . PILONIDAL CYST EXCISION N/A 07/20/2018   Procedure: EXCISION OF PILONIDAL DISEASE;  Surgeon: Andria Meuse, MD;  Location: [facility];  Service: General;  Laterality: N/A;   Social History   Socioeconomic History  . Marital status: Single    Spouse name: Not on file  . Number of children: 2  . Years of education: Not on file  . Highest education level: Not on file  Occupational History  . Not on file  Tobacco Use  . Smoking status: Never Smoker  . Smokeless tobacco: Never Used  Vaping Use  . Vaping Use: Never used  Substance and Sexual Activity  . Alcohol  use: Not Currently    Comment: not since pregnancy  . Drug use: Yes    Types: Marijuana  . Sexual activity: Yes    Birth control/protection: Patch  Other Topics Concern  . Not on file  Social History Narrative  . Not on file   Social Determinants of Health   Financial Resource Strain: Not on file  Food Insecurity: Not on file  Transportation Needs: Not on file  Physical Activity: Not on file  Stress: Not on file  Social Connections: Not on file  Intimate Partner Violence: Not on file   Current Outpatient Medications on File Prior to Visit  Medication Sig Dispense Refill  . acetaminophen (TYLENOL) 500 MG tablet Take 1,000 mg by mouth every 6 (six) hours as needed.     No current facility-administered medications on file prior to visit.   No Known Allergies  I have reviewed patient's Past Medical Hx, Surgical Hx, Family Hx, Social Hx, medications and allergies.   Review of Systems Review of Systems  Constitutional: Negative for fever and chills.  Gastrointestinal: Negative for nausea, vomiting, abdominal pain, diarrhea and constipation.  Genitourinary: Negative for dysuria.  Musculoskeletal: Negative for back pain.  Neurological: Negative for dizziness and weakness.    Physical Exam  BP 114/76   Pulse 78   Wt 114 lb 3.2 oz (51.8 kg)   LMP 06/25/2020   Breastfeeding No   BMI 22.30 kg/m   GENERAL:  Well-developed, well-nourished female in no acute distress.  HEENT: Normocephalic, atraumatic.   LUNGS: Effort normal ABDOMEN: soft, non-tender HEART: Regular rate  SKIN: Warm, dry and without erythema PSYCH: Normal mood and affect NEURO: Alert and oriented x 4  LAB RESULTS No results found for this or any previous visit (from the past 24 hour(s)).  IMAGING US OB Transvaginal  Result Date: 09/30/2020 CLINICAL DATA:  Positive pregnancy test. EXAM: TRANSVAGINAL OB ULTRASOUND TECHNIQUE: Transvaginal ultrasound was performed for complete evaluation of the gestation as  well as the maternal uterus, adnexal regions, and pelvic cul-de-sac. COMPARISON:  None. FINDINGS: Intrauterine gestational sac: Single Yolk sac:  Visualized. Embryo:  Visualized. Cardiac Activity: Visualized. Heart Rate: 121 bpm CRL:   5.5 mm   6 w 2 d                  [place] EDC: 05/24/2021 Subchorionic hemorrhage: Moderate-large subchorionic hemorrhages along the superior and inferior margins of the gestational sac. Maternal uterus/adnexae: Probable right ovarian corpus luteal cyst. Bilateral ovaries otherwise within normal limits. No free fluid within the pelvis. IMPRESSION: 1. Single live intrauterine gestation measuring 6 weeks 2 days by crown-rump length. 2. Active embryonic heart tones at 121 bpm. 3. Moderate-large volume subchorionic hemorrhage. Attention on follow-up. Electronically Signed   By: Duanne Guess D.O.   On: 09/30/2020 09:49    ASSESSMENT 1. Intrauterine pregnancy   2. [redacted] weeks gestation of pregnancy   3. Subchorionic hematoma in first trimester, single or unspecified fetus     PLAN Discharge home in stable condition Subchorionic Hematoma, pelvic rest until cleared by [facility] Provider Bleeding precautions, indications for evaluation in MAU reviewed with patient Patient advised to continue taking prenatal vitamins Pregnancy confirmation letter given Patient advised to start prenatal care with [facility] provider of choice as soon as possible  Clayton Bibles, CNM 09/30/2020  11:51 AM

## 2020-10-06 ENCOUNTER — Other Ambulatory Visit: Payer: Self-pay | Admitting: Lactation Services

## 2020-10-06 MED ORDER — PROMETHAZINE HCL 25 MG PO TABS: 25.0000 mg | ORAL_TABLET | Freq: Four times a day (QID) | ORAL | 0 refills | Status: DC | PRN

## 2020-10-06 NOTE — Progress Notes (Signed)
Phenergan sent to Pharmacy for patients c/o nausea in early pregnancy per standing order.

## 2020-10-30 ENCOUNTER — Telehealth (INDEPENDENT_AMBULATORY_CARE_PROVIDER_SITE_OTHER): Payer: Medicaid Other

## 2020-10-30 DIAGNOSIS — G43909 Migraine, unspecified, not intractable, without status migrainosus: Secondary | ICD-10-CM | POA: Insufficient documentation

## 2020-10-30 DIAGNOSIS — Z3490 Encounter for supervision of normal pregnancy, unspecified, unspecified trimester: Secondary | ICD-10-CM

## 2020-10-30 DIAGNOSIS — Z3A00 Weeks of gestation of pregnancy not specified: Secondary | ICD-10-CM

## 2020-10-30 DIAGNOSIS — Z3480 Encounter for supervision of other normal pregnancy, unspecified trimester: Secondary | ICD-10-CM

## 2020-10-30 DIAGNOSIS — O0990 Supervision of high risk pregnancy, unspecified, unspecified trimester: Secondary | ICD-10-CM | POA: Insufficient documentation

## 2020-10-30 HISTORY — DX: Migraine, unspecified, not intractable, without status migrainosus: G43.909

## 2020-10-30 MED ORDER — BLOOD PRESSURE KIT DEVI: 1.0000 | 0 refills | Status: DC | PRN

## 2020-10-30 MED ORDER — PROMETHAZINE HCL 25 MG PO TABS: 25.0000 mg | ORAL_TABLET | Freq: Four times a day (QID) | ORAL | 0 refills | Status: DC | PRN

## 2020-10-30 NOTE — Progress Notes (Signed)
New OB Intake  I connected with  Kristin Bruce on 10/30/20 at  9:15 AM EDT by MyChart and verified that I am speaking with the correct person using two identifiers. Nurse is located at [facility] and pt is located at home.  I discussed the limitations, risks, security and privacy concerns of performing an evaluation and management service by telephone and the availability of in person appointments. I also discussed with the patient that there may be a patient responsible charge related to this service. The patient expressed understanding and agreed to proceed.  I explained I am completing New OB Intake today. We discussed her EDD of 05/24/21 that is based on LMP of 06/25/20. Pt is G4P2. I reviewed her allergies, medications, Medical/Surgical/OB history, and appropriate screenings. I informed her of [facility] services. Based on history, this is a/an uncomplicated pregnancy.  Patient Active Problem List   Diagnosis Date Noted  . History of pre-eclampsia 09/30/2020  . Pilonidal abscess 05/13/2018  . Perirectal abscess 03/31/2018  . Herpes simplex type 1 infection 06/09/2017  . Rubella non-immune status, antepartum 05/19/2017  . Asthma affecting pregnancy, antepartum 03/16/2016  . Anemia of pregnancy 01/07/2016  . Adjustment disorder with depressed mood 11/22/2015    Concerns addressed today  Delivery Plans:  Plans to deliver at [facility].   MyChart/Babyscripts MyChart access verified. I explained pt will have some visits in office and some virtually. Babyscripts instructions given. Account successfully created and app downloaded.  Blood Pressure Cuff Blood pressure cuff ordered for patient to pick-up from [employer]. Explained after first prenatal appt pt will check weekly and document in Babyscripts.  Anatomy US Explained first scheduled [place] will be around 19 weeks. Anatomy US scheduled for June 13th at 0900. Pt notified to arrive at 0845.  Labs Discussed Kristin Bruce genetic screening with  patient. Would like both Panorama and Horizon drawn at new OB visit. Routine prenatal labs needed.  Covid Vaccine Patient has not covid vaccine.   [facility] Referral Patient is interested in referral to [facility].   Pregnancy Navigator Informed patient a pregnancy navigator will see her at her first ob visit with provider.   [place] appointments Childcare: Informed patient when she has Ultrasound appointments she will not be able to bring child/ children with her to Ultrasound appointments. Asked if childcare would be an issue.   First visit review I reviewed new OB appt with pt. I explained she will have a pelvic exam, ob bloodwork with genetic screening, and PAP smear. Explained pt will be seen by Dr. Alvester Morin at first visit; encounter routed to appropriate provider. If new patient offered monthly Zoom meeting  Ralene Bathe, RN 10/30/2020  9:23 AM

## 2020-11-10 ENCOUNTER — Encounter: Payer: Medicaid Other | Admitting: Family Medicine

## 2020-11-20 ENCOUNTER — Other Ambulatory Visit (HOSPITAL_COMMUNITY)
Admission: RE | Admit: 2020-11-20 | Discharge: 2020-11-20 | Disposition: A | Discharge status: Home / Self Care | Payer: Medicaid Other | Source: Ambulatory Visit | Attending: Family Medicine | Admitting: Family Medicine

## 2020-11-20 ENCOUNTER — Encounter: Payer: Self-pay | Admitting: Family Medicine

## 2020-11-20 ENCOUNTER — Other Ambulatory Visit: Payer: Self-pay

## 2020-11-20 ENCOUNTER — Ambulatory Visit (INDEPENDENT_AMBULATORY_CARE_PROVIDER_SITE_OTHER): Payer: Medicaid Other | Admitting: Family Medicine

## 2020-11-20 VITALS — BP 109/72 | HR 85 | Wt 119.3 lb

## 2020-11-20 DIAGNOSIS — Z8759 Personal history of other complications of pregnancy, childbirth and the puerperium: Secondary | ICD-10-CM

## 2020-11-20 DIAGNOSIS — O0990 Supervision of high risk pregnancy, unspecified, unspecified trimester: Secondary | ICD-10-CM | POA: Insufficient documentation

## 2020-11-20 DIAGNOSIS — O99519 Diseases of the respiratory system complicating pregnancy, unspecified trimester: Secondary | ICD-10-CM

## 2020-11-20 DIAGNOSIS — J45909 Unspecified asthma, uncomplicated: Secondary | ICD-10-CM

## 2020-11-20 DIAGNOSIS — B009 Herpesviral infection, unspecified: Secondary | ICD-10-CM

## 2020-11-20 DIAGNOSIS — F4321 Adjustment disorder with depressed mood: Secondary | ICD-10-CM

## 2020-11-20 MED ORDER — ASPIRIN 81 MG PO CHEW: 81.0000 mg | CHEWABLE_TABLET | Freq: Every day | ORAL | Status: DC

## 2020-11-20 NOTE — Progress Notes (Signed)
Patient complains of pelvic cramps, said they feel like "period cramps"

## 2020-11-20 NOTE — Patient Instructions (Signed)
[email or web address] to review contraception -start baby aspirin, 81 mg daily -continue prenatal vitamins  Second Trimester of Pregnancy  The second trimester of pregnancy is from week 13 through week 27. This is months 4 through 6 of pregnancy. The second trimester is often a time when you feel your best. Your body has adjusted to being pregnant, and you begin to feel better physically. During the second trimester:  Morning sickness has lessened or stopped completely.  You may have more energy.  You may have an increase in appetite. The second trimester is also a time when the unborn baby (fetus) is growing rapidly. At the end of the sixth month, the fetus may be up to 12 inches long and weigh about 1 pounds. You will likely begin to feel the baby move (quickening) between 16 and 20 weeks of pregnancy. Body changes during your second trimester Your body continues to go through many changes during your second trimester. The changes vary and generally return to normal after the baby is born. Physical changes  Your weight will continue to increase. You will notice your lower abdomen bulging out.  You may begin to get stretch marks on your hips, abdomen, and breasts.  Your breasts will continue to grow and to become tender.  Dark spots or blotches (chloasma or mask of pregnancy) may develop on your face.  A dark line from your belly button to the pubic area (linea nigra) may appear.  You may have changes in your hair. These can include thickening of your hair, rapid growth, and changes in texture. Some people also have hair loss during or after pregnancy, or hair that feels dry or thin. Health changes  You may develop headaches.  You may have heartburn.  You may develop constipation.  You may develop hemorrhoids or swollen, bulging veins (varicose veins).  Your gums may bleed and may be sensitive to brushing and flossing.  You may urinate more often because the fetus is pressing on  your bladder.  You may have back pain. This is caused by: ? Weight gain. ? Pregnancy hormones that are relaxing the joints in your pelvis. ? A shift in weight and the muscles that support your balance. Follow these instructions at home: Medicines  Follow your health care provider's instructions regarding medicine use. Specific medicines may be either safe or unsafe to take during pregnancy. Do not take any medicines unless approved by your health care provider.  Take a prenatal vitamin that contains at least 600 micrograms (mcg) of folic acid. Eating and drinking  Eat a healthy diet that includes fresh fruits and vegetables, whole grains, good sources of protein such as meat, eggs, or tofu, and low-fat dairy products.  Avoid raw meat and unpasteurized juice, milk, and cheese. These carry germs that can harm you and your baby.  You may need to take these actions to prevent or treat constipation: ? Drink enough fluid to keep your urine pale yellow. ? Eat foods that are high in fiber, such as beans, whole grains, and fresh fruits and vegetables. ? Limit foods that are high in fat and processed sugars, such as fried or sweet foods. Activity  Exercise only as directed by your health care provider. Most people can continue their usual exercise routine during pregnancy. Try to exercise for 30 minutes at least 5 days a week. Stop exercising if you develop contractions in your uterus.  Stop exercising if you develop pain or cramping in the lower abdomen or lower back.  Avoid exercising if it is very hot or humid or if you are at a high altitude.  Avoid heavy lifting.  If you choose to, you may have sex unless your health care provider tells you not to. Relieving pain and discomfort  Wear a supportive bra to prevent discomfort from breast tenderness.  Take warm sitz baths to soothe any pain or discomfort caused by hemorrhoids. Use hemorrhoid cream if your health care provider  approves.  Rest with your legs raised (elevated) if you have leg cramps or low back pain.  If you develop varicose veins: ? Wear support hose as told by your health care provider. ? Elevate your feet for 15 minutes, 3-4 times a day. ? Limit salt in your diet. Safety  Wear your seat belt at all times when driving or riding in a car.  Talk with your health care provider if someone is verbally or physically abusive to you. Lifestyle  Do not use hot tubs, steam rooms, or saunas.  Do not douche. Do not use tampons or scented sanitary pads.  Avoid cat litter boxes and soil used by cats. These carry germs that can cause birth defects in the baby and possibly loss of the fetus by miscarriage or stillbirth.  Do not use herbal remedies, alcohol, illegal drugs, or medicines that are not approved by your health care provider. Chemicals in these products can harm your baby.  Do not use any products that contain nicotine or tobacco, such as cigarettes, e-cigarettes, and chewing tobacco. If you need help quitting, ask your health care provider. General instructions  During a routine prenatal visit, your health care provider will do a physical exam and other tests. He or she will also discuss your overall health. Keep all follow-up visits. This is important.  Ask your health care provider for a referral to a local prenatal education class.  Ask for help if you have counseling or nutritional needs during pregnancy. Your health care provider can offer advice or refer you to specialists for help with various needs. Where to find more information  American Pregnancy Association: americanpregnancy.org  [employer]necologists: https://www.todd-brady.net/  Office on [employer] [facility]: MightyReward.co.nz Contact a health care provider if you have:  A headache that does not go away when you take medicine.  Vision changes or you see spots in front of your  eyes.  Mild pelvic cramps, pelvic pressure, or nagging pain in the abdominal area.  Persistent nausea, vomiting, or diarrhea.  A bad-smelling vaginal discharge or foul-smelling urine.  Pain when you urinate.  Sudden or extreme swelling of your face, hands, ankles, feet, or legs.  A fever. Get help right away if you:  Have fluid leaking from your vagina.  Have spotting or bleeding from your vagina.  Have severe abdominal cramping or pain.  Have difficulty breathing.  Have chest pain.  Have fainting spells.  Have not felt your baby move for the time period told by your health care provider.  Have new or increased pain, swelling, or redness in an arm or leg. Summary  The second trimester of pregnancy is from week 13 through week 27 (months 4 through 6).  Do not use herbal remedies, alcohol, illegal drugs, or medicines that are not approved by your health care provider. Chemicals in these products can harm your baby.  Exercise only as directed by your health care provider. Most people can continue their usual exercise routine during pregnancy.  Keep all follow-up visits. This is  important. This information is not intended to replace advice given to you by your health care provider. Make sure you discuss any questions you have with your health care provider. Document Revised: 12/12/2019 Document Reviewed: 10/18/2019 Elsevier Patient Education  2021 [employer].

## 2020-11-20 NOTE — Progress Notes (Signed)
History:   Kristin Bruce is a 25 y.o. [id number] at 62w4dby early ultrasound being seen today for her first obstetrical visit.  Her obstetrical history is significant for pre-eclampsia in prior pregnancy. Patient does not intend to breast feed. Pregnancy history fully reviewed.  Patient reports no complaints.      HISTORY: OB History  Gravida Para Term Preterm AB Living  '4 2 1 1 1 2  ' SAB IAB Ectopic Multiple Live Births  1 0 0 0 2    # Outcome Date GA Lbr Len/2nd Weight Sex Delivery Anes PTL Lv  4 Current           3 SAB 08/12/20 683w6d       2 Preterm 12/29/17 3658w6d lb 7 oz (2.92 kg) F Vag-Spont EPI N LIV  1 Term 03/17/16 37w40w1d28 / 03:09 7 lb 2.3 oz (3.24 kg) F Vag-Spont EPI  LIV     Name: XXXJBRYLYNN, HANSSEN Apgar1: 9  Apgar5: 9    Last pap smear was done unsure and was normal, no history of abnormal pap smear.   Past Medical History:  Diagnosis Date  . Anemia   . Depression   . HSV (herpes simplex virus) anogenital infection   . Suicidal thoughts    during pregnancy  . Vaginal delivery 03/17/2016  . Wears glasses    Past Surgical History:  Procedure Laterality Date  . INCISION AND DRAINAGE ABSCESS N/A 05/13/2018   Procedure: INCISION AND DRAINAGE, PILONIDAL  ABSCESS;  Surgeon: WyatJudeth Horn;  Location: [facility]ice: General;  Laterality: N/A;  . INCISION AND DRAINAGE PERIRECTAL ABSCESS N/A 04/01/2018   Procedure: IRRIGATION AND DEBRIDEMENT PERIRECTAL ABSCESS;  Surgeon: ThomGeorganna Skeans;  Location: [facility] O[facility] Hallervice: General;  Laterality: N/A;  . NO PAST SURGERIES    . PILONIDAL CYST EXCISION N/A 07/20/2018   Procedure: EXCISION OF PILONIDAL DISEASE;  Surgeon: WhitIleana Roup;  Location: [facility]ESLBedfordervice: General;  Laterality: N/A;   Family History  Problem Relation Age of Onset  . Diabetes Mother   . Hypertension Mother   . Heart failure Mother   . Diabetes Maternal Grandfather    Social History   Tobacco Use  .  Smoking status: Never Smoker  . Smokeless tobacco: Never Used  Vaping Use  . Vaping Use: Never used  Substance Use Topics  . Alcohol use: Not Currently    Comment: not since pregnancy  . Drug use: Not Currently    Types: Marijuana    Comment: last use was about 10 weeks ago   No Known Allergies Current Outpatient Medications on File Prior to Visit  Medication Sig Dispense Refill  . acetaminophen (TYLENOL) 500 MG tablet Take 1,000 mg by mouth every 6 (six) hours as needed.    . Blood Pressure Monitoring (BLOOD PRESSURE KIT) DEVI 1 Device by Does not apply route as needed. 1 each 0  . Prenatal Vit-Fe Fumarate-FA (PREPLUS) 27-1 MG TABS Take 1 tablet by mouth daily.    . promethazine (PHENERGAN) 25 MG tablet Take 1 tablet (25 mg total) by mouth every 6 (six) hours as needed for nausea or vomiting. (Patient not taking: Reported on 11/20/2020) 30 tablet 0   No current facility-administered medications on file prior to visit.    Review of Systems Pertinent items noted in HPI and remainder of comprehensive ROS otherwise negative. Physical Exam:   Vitals:   11/20/20  1422  BP: 109/72  Pulse: 85  Weight: 119 lb 4.8 oz (54.1 kg)   Fetal Heart Rate (bpm): 155  Uterus:     Pelvic Exam: Perineum: no hemorrhoids, normal perineum   Vulva: normal external genitalia, no lesions   Vagina:  normal mucosa, normal discharge   Cervix: no lesions and normal, pap smear done.    Adnexa: normal adnexa and no mass, fullness, tenderness   Bony Pelvis: average  System: General: well-developed, well-nourished female in no acute distress   Breasts:  normal appearance, no masses or tenderness bilaterally   Skin: normal coloration and turgor, no rashes   Neurologic: oriented, normal, negative, normal mood   Extremities: normal strength, tone, and muscle mass, ROM of all joints is normal   HEENT PERRLA, extraocular movement intact and sclera clear, anicteric   Mouth/Teeth mucous membranes moist, pharynx  normal without lesions and dental hygiene good   Neck supple and no masses   Cardiovascular: regular rate and rhythm   Respiratory:  no respiratory distress, normal breath sounds   Abdomen: soft, non-tender; bowel sounds normal; no masses,  no organomegaly    Assessment:    Pregnancy: [id number] Patient Active Problem List   Diagnosis Date Noted  . Supervision of high risk pregnancy, antepartum 10/30/2020  . Migraine 10/30/2020  . History of pre-eclampsia 09/30/2020  . Herpes simplex type 1 infection 06/09/2017  . Rubella non-immune status, antepartum 05/19/2017  . Asthma affecting pregnancy, antepartum 03/16/2016  . Anemia of pregnancy 01/07/2016  . Adjustment disorder with depressed mood 11/22/2015     Plan:    25 yo [id number] presents for initial OB visit at [redacted]w[redacted]d Supervision of high risk pregnancy, antepartum -Doing well without complaints, VSS -Discussed contraception, unsure at this time, previously on the patch but unsure if she would like to breastfeed. Discussed risk of decreased milk supply with estrogen containing contraception. Referred to [email or web address] -Taking PNV, recommend bASA daily given history of severe preE prior pregnancy -Initial prenatal labs, genetic testing, pap smear obtained today -wet prep also obtained as patient endorses discharge with history of recurrent BV, previously on boric acid daily supression.  Herpes simplex type 1 infection Patient denies any genital outbreak of HSV, unsure why this is noted on problem list. States she has had cold sores in the past. No need for prophylaxis.  Asthma affecting pregnancy, antepartum  History of pre-eclampsia  Adjustment disorder with depressed mood Patient would like BTrippreferral, continue to discuss. Discussed medication management should patient desire.    Initial labs drawn. Continue prenatal vitamins. Problem list reviewed and updated. Genetic Screening discussed, Quad screen and NIPS:  ordered. Ultrasound discussed; fetal anatomic survey: ordered. Anticipatory guidance about prenatal visits given including labs, ultrasounds, and testing. Discussed usage of Babyscripts and virtual visits as additional source of managing and completing prenatal visits in midst of coronavirus and pandemic.   Encouraged to complete MyChart Registration for her ability to review results, send requests, and have questions addressed.  The nature [facility] with multiple MDs and Advanced Practice Providers was explained to patient; also emphasized that residents, students are part of our team. Routine obstetric precautions reviewed. Encouraged to seek out care at office or [facility] for urgent and/or emergent concerns. Return in about 4 weeks (around 12/18/2020) for HROB; in person.     AArrie Senate MD OB Fellow, FDelcofor WHowe5/11/2020 2:47 PM

## 2020-11-21 LAB — CBC
Hematocrit: 33.4 % — ABNORMAL LOW (ref 34.0–46.6)
Hemoglobin: 10.3 g/dL — ABNORMAL LOW (ref 11.1–15.9)
MCH: 27.6 pg (ref 26.6–33.0)
MCHC: 30.8 g/dL — ABNORMAL LOW (ref 31.5–35.7)
MCV: 90 fL (ref 79–97)
Platelets: 296 10*3/uL (ref 150–450)
RBC: 3.73 x10E6/uL — ABNORMAL LOW (ref 3.77–5.28)
RDW: 13.2 % (ref 11.7–15.4)
WBC: 7.3 10*3/uL (ref 3.4–10.8)

## 2020-11-21 LAB — COMPREHENSIVE METABOLIC PANEL
ALT: 9 IU/L (ref 0–32)
AST: 12 IU/L (ref 0–40)
Albumin/Globulin Ratio: 1.5 (ref 1.2–2.2)
Albumin: 4.1 g/dL (ref 3.9–5.0)
Alkaline Phosphatase: 45 IU/L (ref 44–121)
BUN/Creatinine Ratio: 19 (ref 9–23)
BUN: 10 mg/dL (ref 6–20)
Bilirubin Total: 0.6 mg/dL (ref 0.0–1.2)
CO2: 20 mmol/L (ref 20–29)
Calcium: 9 mg/dL (ref 8.7–10.2)
Chloride: 104 mmol/L (ref 96–106)
Creatinine, Ser: 0.53 mg/dL — ABNORMAL LOW (ref 0.57–1.00)
Globulin, Total: 2.8 g/dL (ref 1.5–4.5)
Glucose: 78 mg/dL (ref 65–99)
Potassium: 3.9 mmol/L (ref 3.5–5.2)
Sodium: 137 mmol/L (ref 134–144)
Total Protein: 6.9 g/dL (ref 6.0–8.5)
eGFR: 132 mL/min/{1.73_m2} (ref >59)

## 2020-11-21 LAB — PROTEIN / CREATININE RATIO, URINE
Creatinine, Urine: 130.5 mg/dL
Protein, Ur: 10.2 mg/dL
Protein/Creat Ratio: 78 mg/g creat (ref 0–200)

## 2020-11-21 LAB — CERVICOVAGINAL ANCILLARY ONLY
Bacterial Vaginitis (gardnerella): POSITIVE — AB
Candida Glabrata: NEGATIVE
Candida Vaginitis: NEGATIVE
Chlamydia: NEGATIVE
Comment: NEGATIVE
Comment: NEGATIVE
Comment: NEGATIVE
Comment: NEGATIVE
Comment: NEGATIVE
Comment: NORMAL
Neisseria Gonorrhea: NEGATIVE
Trichomonas: NEGATIVE

## 2020-11-21 LAB — HEMOGLOBIN A1C
Est. average glucose Bld gHb Est-mCnc: 82 mg/dL
Hgb A1c MFr Bld: 4.5 % — ABNORMAL LOW (ref 4.8–5.6)

## 2020-11-21 LAB — CYTOLOGY - PAP: Diagnosis: NEGATIVE

## 2020-11-21 LAB — HIV ANTIBODY (ROUTINE TESTING W REFLEX): HIV Screen 4th Generation wRfx: NONREACTIVE

## 2020-11-21 LAB — RPR: RPR Ser Ql: NONREACTIVE

## 2020-11-22 LAB — URINE CULTURE, OB REFLEX

## 2020-11-22 LAB — CULTURE, OB URINE

## 2020-12-01 ENCOUNTER — Encounter (HOSPITAL_COMMUNITY): Payer: Self-pay | Admitting: Obstetrics & Gynecology

## 2020-12-01 ENCOUNTER — Inpatient Hospital Stay (HOSPITAL_COMMUNITY)
Admission: AD | Admit: 2020-12-01 | Discharge: 2020-12-01 | Disposition: A | Discharge status: Home / Self Care | Payer: Medicaid Other | Attending: Obstetrics & Gynecology | Admitting: Obstetrics & Gynecology

## 2020-12-01 ENCOUNTER — Other Ambulatory Visit: Payer: Self-pay

## 2020-12-01 ENCOUNTER — Inpatient Hospital Stay (HOSPITAL_BASED_OUTPATIENT_CLINIC_OR_DEPARTMENT_OTHER): Payer: Medicaid Other

## 2020-12-01 DIAGNOSIS — B9689 Other specified bacterial agents as the cause of diseases classified elsewhere: Secondary | ICD-10-CM | POA: Diagnosis not present

## 2020-12-01 DIAGNOSIS — B373 Candidiasis of vulva and vagina: Secondary | ICD-10-CM | POA: Diagnosis not present

## 2020-12-01 DIAGNOSIS — O23592 Infection of other part of genital tract in pregnancy, second trimester: Secondary | ICD-10-CM | POA: Diagnosis not present

## 2020-12-01 DIAGNOSIS — Z3492 Encounter for supervision of normal pregnancy, unspecified, second trimester: Secondary | ICD-10-CM

## 2020-12-01 DIAGNOSIS — O4692 Antepartum hemorrhage, unspecified, second trimester: Secondary | ICD-10-CM | POA: Diagnosis present

## 2020-12-01 DIAGNOSIS — R102 Pelvic and perineal pain: Secondary | ICD-10-CM

## 2020-12-01 DIAGNOSIS — N939 Abnormal uterine and vaginal bleeding, unspecified: Secondary | ICD-10-CM

## 2020-12-01 DIAGNOSIS — O26852 Spotting complicating pregnancy, second trimester: Secondary | ICD-10-CM | POA: Diagnosis not present

## 2020-12-01 DIAGNOSIS — Z3A15 15 weeks gestation of pregnancy: Secondary | ICD-10-CM | POA: Diagnosis not present

## 2020-12-01 DIAGNOSIS — O23593 Infection of other part of genital tract in pregnancy, third trimester: Secondary | ICD-10-CM

## 2020-12-01 LAB — URINALYSIS, ROUTINE W REFLEX MICROSCOPIC
Bilirubin Urine: NEGATIVE
Glucose, UA: NEGATIVE mg/dL
Hgb urine dipstick: NEGATIVE
Ketones, ur: NEGATIVE mg/dL
Leukocytes,Ua: NEGATIVE
Nitrite: NEGATIVE
Protein, ur: NEGATIVE mg/dL
Specific Gravity, Urine: 1.023 (ref 1.005–1.030)
pH: 6 (ref 5.0–8.0)

## 2020-12-01 NOTE — MAU Note (Signed)
Pt reports some spotting today. Has had cramping with walking  for a few days was told she may need a support belt for the cramping.  Spotting just started today last intercourse 3 days ago.

## 2020-12-01 NOTE — Progress Notes (Signed)
Attestation of Attending Supervision of clinical support staff: I agree with the care provided to this patient and was available for any consultation.  I have reviewed the RN's note and chart. I was available for consult and to see the patient if needed.   Jermarion Poffenberger Niles Gifford Ballon, MD, MPH, ABFM Attending Physician Faculty Practice- [facility]  

## 2020-12-01 NOTE — MAU Provider Note (Signed)
Event Date/Time   First Provider Initiated Contact with Patient 12/01/20 2122     Kristin Bruce is a 25 y.o. [phone] pregnant female at 67w2dwho presents to MAU today with complaint of very light vaginal spotting (light pink only when wiping) and pelvic pain. 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 Has not started using a pregnancy belt. Was seen in MAU for vaginal bleeding in March and was told she had a subchorionic hematoma and to be on pelvic rest. Last IC three days ago. Was just tested for STIs and vaginal infections on 11/20/20 and states they all came back normal. Had a miscarriage in January and is concerned about the viability of this pregnancy. No other physical complaints.  [facility]  Pertinent items noted in HPI and remainder of comprehensive ROS otherwise negative.  O BP 120/80   Pulse 98   Temp 99 F (37.2 C)   Resp 18   Ht 5' (1.524 m)   Wt 120 lb (54.4 kg)   LMP 06/25/2020   BMI 23.44 kg/m  Physical Exam Vitals and nursing note reviewed.  Constitutional:      General: She is not in acute distress.    Appearance: Normal appearance. She is not ill-appearing.  HENT:     Head: Normocephalic and atraumatic.     Mouth/Throat:     Mouth: Mucous membranes are moist.  Eyes:     Pupils: Pupils are equal, round, and reactive to light.  Cardiovascular:     Rate and Rhythm: Normal rate.     Pulses: Normal pulses.  Pulmonary:     Effort: Pulmonary effort is normal.  Musculoskeletal:        General: Normal range of motion.  Skin:    General: Skin is warm and dry.     Capillary Refill: Capillary refill takes less than 2 seconds.  Neurological:     Mental Status: She is alert and oriented to person, place, and time.  Psychiatric:        Mood and Affect: Mood normal.        Behavior: Behavior normal.        Thought Content: Thought content normal.        Judgment:  Judgment normal.    Upon review of prenatal records, wet prep collected on 11/20/20 was + for BV, no treatment on file. Will send metrogel to pharmacy.  FHR: 157 (pt given chance to get heartbeat on video to help ease her anxiety at home)  Follow [facility] given to patient who verbalized decrease in anxiety.  A Vaginal spotting in second trimester Bacterial vaginosis Fetal heart tones present, second trimester  P Discharge from MAU in stable condition with preterm labor precautions - Referral made for pelvic floor physical therapy - Metrogel sent to pharmacy  [place] [facility] [facility]. Go to.   Specialty: Obstetrics and Gynecology Why: as scheduled for ongoing prenatal care Contact information: [address] [place] [phone]            Allergies as of 12/01/2020   No Known Allergies     Medication List    TAKE these medications   acetaminophen 500 MG tablet Commonly known as: TYLENOL Take 1,000 mg  by mouth every 6 (six) hours as needed.   aspirin 81 MG chewable tablet Chew 1 tablet (81 mg total) by mouth daily.   Blood Pressure Kit Devi 1 Device by Does not apply route as needed.   PrePLUS 27-1 MG Tabs Take 1 tablet by mouth daily.   promethazine 25 MG tablet Commonly known as: PHENERGAN Take 1 tablet (25 mg total) by mouth every 6 (six) hours as needed for nausea or vomiting.      Gabriel Carina, [place] 12/01/2020 9:46 PM

## 2020-12-02 ENCOUNTER — Other Ambulatory Visit: Payer: Self-pay | Admitting: Certified Nurse Midwife

## 2020-12-02 DIAGNOSIS — O4692 Antepartum hemorrhage, unspecified, second trimester: Secondary | ICD-10-CM

## 2020-12-02 DIAGNOSIS — O26892 Other specified pregnancy related conditions, second trimester: Secondary | ICD-10-CM

## 2020-12-02 DIAGNOSIS — B9689 Other specified bacterial agents as the cause of diseases classified elsewhere: Secondary | ICD-10-CM

## 2020-12-02 DIAGNOSIS — N760 Acute vaginitis: Secondary | ICD-10-CM

## 2020-12-02 DIAGNOSIS — Z3A15 15 weeks gestation of pregnancy: Secondary | ICD-10-CM

## 2020-12-02 DIAGNOSIS — R102 Pelvic and perineal pain: Secondary | ICD-10-CM

## 2020-12-02 MED ORDER — METRONIDAZOLE 0.75 % VA GEL: 1.0000 | Freq: Two times a day (BID) | VAGINAL | 0 refills | Status: DC

## 2020-12-02 NOTE — Addendum Note (Signed)
Addended by: Edd Arbour on: 12/02/2020 02:41 AM   Modules accepted: Orders

## 2020-12-02 NOTE — Progress Notes (Addendum)
Referral placed for pelvic floor physical therapy, pt dealing with pubic symphysis pain as she did in her last pregnancy. Metrogel sent to pharmacy as her testing was + for BV on 11/20/20. Edd Arbour, CNM, MSN, IBCLC Certified Nurse Midwife, [facility]

## 2020-12-04 ENCOUNTER — Encounter: Payer: Self-pay | Admitting: *Deleted

## 2020-12-04 NOTE — BH Specialist Note (Signed)
Integrated Behavioral Health via Telemedicine Visit  12/04/2020 PAYETON GERMANI [id number]  Number of Integrated Behavioral Health visits: 1 Session Start time: 10:18  Session End time: 10:34 Total time: 16  Referring Provider: Donia Ast, NP Patient/Family location: Home [place] [facility] Provider location: [facility]  All persons participating in visit: Patient Kristin Bruce and [facility]   Types of Service: Individual psychotherapy and Video visit  I connected with [name] and/or Valaria Good L Wyka's n/a via  Telephone or Video Enabled Telemedicine Application  (Video is Caregility application) and verified that I am speaking with the correct person using two identifiers. Discussed confidentiality: Yes   I discussed the limitations of telemedicine and the availability of in person appointments.  Discussed there is a possibility of technology failure and discussed alternative modes of communication if that failure occurs.  I discussed that engaging in this telemedicine visit, they consent to the provision of behavioral healthcare and the services will be billed under their insurance.  Patient and/or legal guardian expressed understanding and consented to Telemedicine visit: Yes   Presenting Concerns: Patient and/or family reports the following symptoms/concerns: Pt states her primary goal is to prevent depression as experienced in a previous pregnancy; is sleeping and eating well, has good support at home;  concern is pelvic pain (seeing PT for this) low iron, attributing to fatigue, and continued nausea.  Duration of problem: Current pregnancy; Severity of problem: mild  Patient and/or Family's Strengths/Protective Factors: Social connections and Sense of purpose  Goals Addressed: Patient will: 1.  Maintain reduction of symptoms of: depression  2.  Increase knowledge and/or ability of: healthy habits  3.  Demonstrate  ability to: Increase healthy adjustment to current life circumstances  Progress towards Goals: Ongoing  Interventions: Interventions utilized:  Solution-Focused Strategies and Psychoeducation and/or Health Education Standardized Assessments completed: Not Needed  Patient and/or Family Response: Pt agrees to treatment plan  Assessment: Patient currently experiencing At risk for depression.   Patient may benefit from psychoeducation and brief therapeutic interventions regarding maintaining reduction of depressive symptoms .  Plan: 1. Follow up with behavioral health clinician on : Call Asher Muir as needed at [phone] 2. Behavioral recommendations:  -Continue taking prenatal vitamin with iron in it daily; consider pairing up iron-rich foods with vitamin c foods remainder of pregnancy -Consider viewing video tour of [facility] at [email or web address]; register for childbirth education class of choice -Continue with plan to attend physical therapy appointment 02/05/21 -Continue taking anti-nausea meds for as long as needed (discuss with ob/gyn provider at 12/29/20 visit) -Consider reading through Postpartum Planner (on After Visit Summary) 3. Referral(s): Integrated [employer] (In Clinic)  I discussed the assessment and treatment plan with the patient and/or parent/guardian. They were provided an opportunity to ask questions and all were answered. They agreed with the plan and demonstrated an understanding of the instructions.   They were advised to call back or seek an in-person evaluation if the symptoms worsen or if the condition fails to improve as anticipated.  Rae Lips, LCSW   Depression screen [facility] 2/9 11/20/2020  Decreased Interest 0  Down, Depressed, Hopeless 0  PHQ - 2 Score 0  Altered sleeping 0  Tired, decreased energy 0  Change in appetite 0  Feeling bad or failure about yourself  0  Trouble concentrating 0  Moving slowly or  fidgety/restless 0  Suicidal thoughts 0  PHQ-9 Score 0   GAD 7 : Generalized  Anxiety Score 11/20/2020  Nervous, Anxious, on Edge 0  Control/stop worrying 0  Worry too much - different things 0  Trouble relaxing 0  Restless 0  Easily annoyed or irritable 0  Afraid - awful might happen 0  Total GAD 7 Score 0

## 2020-12-08 ENCOUNTER — Telehealth: Payer: Self-pay | Admitting: Lactation Services

## 2020-12-08 NOTE — Telephone Encounter (Signed)
Called patient with results of [employer] Genetic Screening. Results show patient is silent carrier for Alpha Thalassemia.   Patient did not answer. Mailbox full and cannot accept messages. My Chart Message sent.

## 2020-12-09 ENCOUNTER — Encounter: Payer: Self-pay | Admitting: Family Medicine

## 2020-12-09 DIAGNOSIS — D563 Thalassemia minor: Secondary | ICD-10-CM | POA: Insufficient documentation

## 2020-12-10 ENCOUNTER — Encounter: Payer: Self-pay | Admitting: *Deleted

## 2020-12-17 ENCOUNTER — Ambulatory Visit (INDEPENDENT_AMBULATORY_CARE_PROVIDER_SITE_OTHER): Payer: Medicaid Other | Admitting: Clinical

## 2020-12-17 DIAGNOSIS — F4321 Adjustment disorder with depressed mood: Secondary | ICD-10-CM

## 2020-12-17 DIAGNOSIS — Z3A00 Weeks of gestation of pregnancy not specified: Secondary | ICD-10-CM

## 2020-12-17 DIAGNOSIS — O99340 Other mental disorders complicating pregnancy, unspecified trimester: Secondary | ICD-10-CM

## 2020-12-17 DIAGNOSIS — Z9189 Other specified personal risk factors, not elsewhere classified: Secondary | ICD-10-CM

## 2020-12-17 NOTE — Patient Instructions (Signed)
[facility] for [facility] [place], [place] [address] [phone] (main office) [phone] ([place] office)     BRAINSTORMING  Develop a Plan Goals: . Provide a way to start conversation about your new life with a baby . Assist parents in recognizing and using resources within their reach . Help pave the way before birth for an easier period of transition afterwards.  Make a list of the following information to keep in a central location: . Full name of Mom and Partner: _____________________________________________ . 80 full name and Date of Birth: ___________________________________________ . Home Address: ___________________________________________________________ ________________________________________________________________________ . Home Phone: ____________________________________________________________ . Parents' cell numbers: _____________________________________________________ ________________________________________________________________________ . Name and contact info for OB: ______________________________________________ . Name and contact info for Pediatrician:________________________________________ . Contact info for Lactation Consultants: ________________________________________  REST and SLEEP *You each need at least 4-5 hours of uninterrupted sleep every day. Write specific names and contact information.* . How are you going to rest in the postpartum period? While partner's home? When partner returns to work? When you both return to work? Marland Kitchen Where will your baby sleep? Marland Kitchen Who is available to help during the day? Evening? Night? . Who could move in for a period to help support you? Marland Kitchen What are some ideas to help you get enough  sleep? __________________________________________________________________________________________________________________________________________________________________________________________________________________________________________ NUTRITIOUS FOOD AND DRINK *Plan for meals before your baby is born so you can have healthy food to eat during the immediate postpartum period.* . Who will look after breakfast? Lunch? Dinner? List names and contact information. Brainstorm quick, healthy ideas for each meal. . What can you do before baby is born to prepare meals for the postpartum period? . How can others help you with meals? Marland Kitchen Which grocery stores provide online shopping and delivery? Marland Kitchen Which restaurants offer take-out or delivery options? ______________________________________________________________________________________________________________________________________________________________________________________________________________________________________________________________________________________________________________________________________________________________________________________________________  CARE FOR MOM *It's important that mom is cared for and pampered in the postpartum period. Remember, the most important ways new mothers need care are: sleep, nutrition, gentle exercise, and time off.* . Who can come take care of mom during this period? Make a list of people with their contact information. . List some activities that make you feel cared for, rested, and energized? Who can make sure you have opportunities to do these things? . Does mom have a space of her very own within your home that's just for her? Make a "[facility]" where she can be comfortable, rest, and renew herself  daily. ______________________________________________________________________________________________________________________________________________________________________________________________________________________________________________________________________________________________________________________________________________________________________________________________________    CARE FOR AND FEEDING BABY *Knowledgeable and encouraging people will offer the best support with regard to feeding your baby.* . Educate yourself and choose the best feeding option for your baby. . Make a list of people who will guide, support, and be a resource for you as your care for and feed your baby. (Friends that have breastfed or are currently breastfeeding, lactation consultants, breastfeeding support groups, etc.) . Consider a postpartum doula. (These websites can give you information: dona.org & BuyingShow.es) . Seek out local breastfeeding resources like the breastfeeding support group at [employer] or [employer]. ______________________________________________________________________________________________________________________________________________________________________________________________________________________________________________________________________________________________________________________________________________________________________________________________________  Verner Chol AND ERRANDS . Who can help with a thorough cleaning before baby is born? . Make a list of people who will help with housekeeping and chores, like laundry, light cleaning, dishes, bathrooms, etc. . Who can run some errands for you? Marland Kitchen What can you do to make sure you are stocked with basic supplies before baby is born? . Who is going to do the  shopping? ______________________________________________________________________________________________________________________________________________________________________________________________________________________________________________________________________________________________________________________________________________________________________________________________________     Family Adjustment *Nurture yourselves.it helps parents be more loving and allows for better bonding with their child.* . What sorts of things do you and partner enjoy doing together? Which activities help you to connect and strengthen your relationship? Make a list of those things. Make a list of people whom you trust to care for your baby so you can have some time together as a couple. . What types of things help partner feel connected to Mom? Make a list. . What needs will partner have in order to bond with baby? . Other children? Who will care for them when you go into labor and while you are in the hospital? . Think about what the needs of your older children might be. Who can help you meet those needs? In what ways are you helping them prepare for bringing baby home? List some specific strategies you have for family adjustment. _______________________________________________________________________________________________________________________________________________________________________________________________________________________________________________________________________________________________________________________________________________  SUPPORT *Someone who can empathize with experiences normalizes your problems and makes them more bearable.* . Make a list of other friends, neighbors, and/or co-workers you know with infants (and small children, if applicable) with whom you can connect. . Make a list of local or online support groups, mom groups, etc. in which you can be  involved. ______________________________________________________________________________________________________________________________________________________________________________________________________________________________________________________________________________________________________________________________________________________________________________________________________  Childcare Plans . Investigate and plan for childcare if mom is returning to work. . Talk about mom's concerns about her transition back to work. . Talk about partner's concerns regarding this transition.  Mental Health *Your mental health is one of the highest priorities for a pregnant or postpartum mom.* . 1 in 5 women experience anxiety and/or depression from the time of conception through the first year after birth. . Postpartum Mood Disorders are the #1 complication of pregnancy and childbirth and the suffering experienced by these mothers is not necessary! These illnesses are temporary and respond well to treatment, which often includes self-care, social support, talk therapy, and medication when needed. . Women experiencing anxiety and depression often say things like: "I'm supposed to be happy.why do I feel so sad?", "Why can't I snap out of it?", "I'm having thoughts that scare me." . There is no need to be embarrassed if you are feeling these symptoms: o Overwhelmed, anxious, angry, sad, guilty, irritable, hopeless, exhausted but can't sleep o You are NOT alone. You are NOT to blame. With help, you WILL be well. . Where can I find help? Medical professionals such as your OB, midwife, gynecologist, family practitioner, primary care provider, pediatrician, or mental health providers; [facility] support groups: Feelings After Birth, Breastfeeding Support Group, Baby and Me Group, and Fit 4 Two exercise classes. . You have permission to ask for help. It will confirm your feelings, validate your  experiences, share/learn coping strategies, and gain support and encouragement as you heal. You are important! BRAINSTORM . Make a list of local resources, including resources for mom and for partner. . Identify support groups. . Identify people to call late at night - include names and contact info. . Talk with partner about perinatal mood and anxiety disorders. . Talk with your OB, midwife, and doula about baby blues and about perinatal mood and anxiety disorders. . Talk with your pediatrician about perinatal mood and anxiety disorders.   Support & Sanity Savers   What do you really need?  . Basics . In preparing for a new baby, many expectant parents spend hours shopping for baby clothes, decorating the nursery, and deciding which car seat to  buy. Yet most don't think much about what the reality of parenting a newborn will be like, and what they need to make it through that. So, here is the advice of experienced parents. We know you'll read this, and think "they're exaggerating, I don't really need that." Just trust [place] on these, OK? Plan for all of this, and if it turns out you don't need it, come back and teach [place] how you did it!  Satira Anis (Once baby's survival needs are met, make sure you attend to your own survival needs!) . Sleep . An average newborn sleeps 16-18 hours per day, over 6-7 sleep periods, rarely more than three hours at a time. It is normal and healthy for a newborn to wake throughout the night... but really hard on parents!! . Naps. Prioritize sleep above any responsibilities like: cleaning house, visiting friends, running errands, etc.  Sleep whenever baby sleeps. If you can't nap, at least have restful times when baby eats. The more rest you get, the more patient you will be, the more emotionally stable, and better at solving problems.  . Food . You may not have realized it would be difficult to eat when you have a newborn. Yet, when we talk to . countless new  parents, they say things like "it may be 2:00 pm when I realize I haven't had breakfast yet." Or "every time we sit down to dinner, baby needs to eat, and my food gets cold, so I don't bother to eat it." . Finger food. Before your baby is born, stock up with one months' worth of food that: 1) you can eat with one hand while holding a baby, 2) doesn't need to be prepped, 3) is good hot or cold, 4) doesn't spoil when left out for a few hours, and 5) you like to eat. Think about: nuts, dried fruit, Clif bars, pretzels, jerky, gogurt, baby carrots, apples, bananas, crackers, cheez-n-crackers, string cheese, hot pockets or frozen burritos to microwave, garden burgers and breakfast pastries to put in the toaster, yogurt drinks, etc. . Restaurant Menus. Make lists of your favorite restaurants & menu items. When family/friends want to help, you can give specific information without much thought. They can either bring you the food or send gift cards for just the right meals. Rosaura Carpenter Meals.  Take some time to make a few meals to put in the freezer ahead of time.  Easy to freeze meals can be anything such as soup, lasagna, chicken pie, or spaghetti sauce. . Set up a Meal Schedule.  Ask friends and family to sign up to bring you meals during the first few weeks of being home. (It can be passed around at baby showers!) You have no idea how helpful this will be until you are in the throes of parenting.  https://hamilton-woodard.com/ is a great website to check out. . Emotional Support . Know who to call when you're stressed out. Parenting a newborn is very challenging work. There are times when it totally overwhelms your normal coping abilities. EVERY NEW PARENT NEEDS TO HAVE A PLAN FOR WHO TO CALL WHEN THEY JUST CAN'T COPE ANY MORE. (And it has to be someone other than the baby's other parent!) Before your baby is born, come up with at least one person you can call for support - write their phone number down and post it on the  refrigerator. Marland Kitchen Anxiety & Sadness. Baby blues are normal after pregnancy; however, there are more severe types of anxiety &  sadness which can occur and should not be ignored.  They are always treatable, but you have to take the first step by reaching out for help. [facility] offers a "Mom Talk" group which meets every Tuesday from 10 am - 11 am.  This group is for new moms who need support and connection after their babies are born.  Call [phone].  Marland Kitchen Really, Really Helpful (Plan for them! Make sure these happen often!!) . Physical Support with Taking Care of Yourselves . Asking friends and family. Before your baby is born, set up a schedule of people who can come and visit and help out (or ask a friend to schedule for you). Any time someone says "let me know what I can do to help," sign them up for a day. When they get there, their job is not to take care of the baby (that's your job and your joy). Their job is to take care of you!  . Postpartum doulas. If you don't have anyone you can call on for support, look into postpartum doulas:  professionals at helping parents with caring for baby, caring for themselves, getting breastfeeding started, and helping with household tasks. www.padanc.org is a helpful website for learning about doulas in our area. . Peer Support / Parent Groups . Why: One of the greatest ideas for new parents is to be around other new parents. Parent groups give you a chance to share and listen to others who are going through the same season of life, get a sense of what is normal infant development by watching several babies learn and grow, share your stories of triumph and struggles with empathetic ears, and forgive your own mistakes when you realize all parents are learning by trial and error. . Where to find: There are many places you can meet other new parents throughout our community.  [facility] offers the following classes for new moms and their little ones:  Baby  and Me (Birth to [place]) and Breastfeeding Support Group. Go to www.conehealthybaby.com or call [phone] for more information. . Time for your Relationship . It's easy to get so caught up in meeting baby's immediate needs that it's hard to find time to connect with your partner, and meet the needs of your relationship. It's also easy to forget what "quality time with your partner" actually looks like. If you take your baby on a date, you'd be amazed how much of your couple time is spent feeding the baby, diapering the baby, admiring the baby, and talking about the baby. . Dating: Try to take time for just the two of you. Babysitter tip: Sometimes when moms are breastfeeding a newborn, they find it hard to figure out how to schedule outings around baby's unpredictable feeding schedules. Have the babysitter come for a three hour period. When she comes over, if baby has just eaten, you can leave right away, and come back in two hours. If baby hasn't fed recently, you start the date at home. Once baby gets hungry and gets a good feeding in, you can head out for the rest of your date time. . Date Nights at Home: If you can't get out, at least set aside one evening a week to prioritize your relationship: whenever baby dozes off or doesn't have any immediate needs, spend a little time focusing on each other. . Potential conflicts: The main relationship conflicts that come up for new parents are: issues related to sexuality, financial stresses, a feeling of an unfair division  of household tasks, and conflicts in parenting styles. The more you can work on these issues before baby arrives, the better!  . Fun and Frills (Don't forget these. and don't feel guilty for indulging in them!) . Everyone has something in life that is a fun little treat that they do just for themselves. It may be: reading the morning paper, or going for a daily jog, or having coffee with a friend once a week, or going to a movie on Friday  nights, or fine chocolates, or bubble baths, or curling up with a good book. . Unless you do fun things for yourself every now and then, it's hard to have the energy for fun with your baby. Whatever your "special" treats are, make sure you find a way to continue to indulge in them after your baby is born. These special moments can recharge you, and allow you to return to baby with a new joy   PERINATAL MOOD DISORDERS: MATERNAL MENTAL HEALTH FROM CONCEPTION THROUGH THE POSTPARTUM PERIOD   Emergency and Crisis Resources:  If you are an imminent risk to self or others, are experiencing intense personal distress, and/or have noticed significant changes in activities of daily living, call:  . 911 . [facility]: [phone] . Mobile Crisis: 877-626-1772 . National Suicide Hotline: 1-800-273-8255 Or visit the following crisis centers: . Local Emergency Departments . [facility]: [address], [place] [phone]. Hours: 8:30AM-5PM. Insurance Accepted: Medicaid, Medicare, and Uninsured.  . [facility]  [address], [place] Mon-Friday 8am-3pm  [phone]                                                                                    Non-Crisis Resources: To identify specific providers that are covered by your insurance, contact your insurance company or local agencies: [facility]: 1-800-256-2452 [facility]: 888-581-9988 [employer]: 1-800-939-5911 Postpartum Support International- [place] 1-800-944-4773                                                      Outpatient therapy and medication management providers:  [facility] [phone] Hours: 9AM-5PM  Insurance Accepted: AARP, Aetna, BCBS, Cigna, [employer], Humana, Medicare  [facility] [phone] Hours: 8AM-5PM  nsurance Accepted: All insurances EXCEPT AARP, Aetna, [employer], and  [employer] [facility]: [phone]             Hours: 8AM-8PM Insurance Accepted: Aetna, BCBS, Cigna, [employer], Medicaid, Medicare, Uninsured [facility]: [phone] [facility]: [phone] Hours: 8:30AM-7PM Insurance Accepted: Aetna, BCBS, Medicaid, Medicare, Tricare, [employer] [facility]:  [phone]              Hours:9AM-5PM Insurance Accepted:  Aetna, BCBS, [facility], Medicaid, United Health Care  [facility] [phone] Hours: 9AM-5:30PM Insurance Accepted: AARP, Aetna, BCBS, Cigna, and Medicaid, Medicare, United Health Care [facility]:  [phone] Hours: 9am-5pm Insurance Accepted: BCBS; they do not accept Medicaid/Medicare   [facility]: [phone] Hours: 9am-9pm Insurance Accepted: All major insurance including Medicaid and Medicare [facility]: [phone] Hours: 9AM-5:30PM Insurance Accepted: All insurances EXCEPT Medicaid and Medicare. [facility]: [place]: [phone] [place]:  [place] (support for children in the NICU and/or with special needs), [place] [facility]: [phone]                                                                                     Online Resources: Postpartum Support International: http://Deblanc-berg.[email or web address]/  800-944-4PPD 2Moms Supporting Moms:  www.momssupportingmoms.net

## 2020-12-29 ENCOUNTER — Encounter: Payer: Self-pay | Admitting: *Deleted

## 2020-12-29 ENCOUNTER — Ambulatory Visit (INDEPENDENT_AMBULATORY_CARE_PROVIDER_SITE_OTHER): Payer: Medicaid Other | Admitting: Obstetrics and Gynecology

## 2020-12-29 ENCOUNTER — Other Ambulatory Visit: Payer: Self-pay

## 2020-12-29 ENCOUNTER — Ambulatory Visit: Payer: Medicaid Other | Attending: Family Medicine

## 2020-12-29 ENCOUNTER — Encounter: Payer: Self-pay | Admitting: Family Medicine

## 2020-12-29 ENCOUNTER — Ambulatory Visit: Payer: Medicaid Other | Admitting: *Deleted

## 2020-12-29 VITALS — BP 115/75 | HR 89

## 2020-12-29 VITALS — BP 111/77 | HR 81 | Wt 121.6 lb

## 2020-12-29 DIAGNOSIS — O0990 Supervision of high risk pregnancy, unspecified, unspecified trimester: Secondary | ICD-10-CM

## 2020-12-29 DIAGNOSIS — D563 Thalassemia minor: Secondary | ICD-10-CM

## 2020-12-29 DIAGNOSIS — Z3490 Encounter for supervision of normal pregnancy, unspecified, unspecified trimester: Secondary | ICD-10-CM

## 2020-12-29 DIAGNOSIS — Z2839 Other underimmunization status: Secondary | ICD-10-CM

## 2020-12-29 DIAGNOSIS — Z8759 Personal history of other complications of pregnancy, childbirth and the puerperium: Secondary | ICD-10-CM

## 2020-12-29 DIAGNOSIS — B009 Herpesviral infection, unspecified: Secondary | ICD-10-CM

## 2020-12-29 DIAGNOSIS — Z3A19 19 weeks gestation of pregnancy: Secondary | ICD-10-CM | POA: Insufficient documentation

## 2020-12-29 DIAGNOSIS — O99012 Anemia complicating pregnancy, second trimester: Secondary | ICD-10-CM

## 2020-12-29 NOTE — Progress Notes (Signed)
   PRENATAL VISIT NOTE  Subjective:  Kristin Bruce is a 25 y.o. [id number] at [redacted]w[redacted]d being seen today for ongoing prenatal care.  She is currently monitored for the following issues for this high-risk pregnancy and has Adjustment disorder with depressed mood; Anemia of pregnancy; Rubella non-immune status, antepartum; Herpes simplex type 1 infection; History of pre-eclampsia; Supervision of high risk pregnancy, antepartum; Migraine; Alpha thalassemia silent carrier; and [redacted] weeks gestation of pregnancy on their problem list.  Patient doing well with no acute concerns today. She reports no complaints.  Contractions: Not present. Vag. Bleeding: None.  Movement: Present. Denies leaking of fluid.   The following portions of the patient's history were reviewed and updated as appropriate: allergies, current medications, past family history, past medical history, past social history, past surgical history and problem list. Problem list updated.  Objective:   Vitals:   12/29/20 1103  BP: 111/77  Pulse: 81  Weight: 121 lb 9.6 oz (55.2 kg)    Fetal Status: Fetal Heart Rate (bpm): 157 Fundal Height: 19 cm Movement: Present     General:  Alert, oriented and cooperative. Patient is in no acute distress.  Skin: Skin is warm and dry. No rash noted.   Cardiovascular: Normal heart rate noted  Respiratory: Normal respiratory effort, no problems with respiration noted  Abdomen: Soft, gravid, appropriate for gestational age.  Pain/Pressure: Absent     Pelvic: Cervical exam deferred        Extremities: Normal range of motion.  Edema: None  Mental Status:  Normal mood and affect. Normal behavior. Normal judgment and thought content.   Assessment and Plan:  Pregnancy: [id number] at [redacted]w[redacted]d  1. Supervision of high risk pregnancy, antepartum Continue routine care - AFP, Serum, Open Spina Bifida  2. [redacted] weeks gestation of pregnancy   3. Anemia during pregnancy in second trimester   4. Rubella non-immune  status, antepartum Treat after delivery  5. History of pre-eclampsia No s/sx of elevated BP  6. Alpha thalassemia silent carrier   Preterm labor symptoms and general obstetric precautions including but not limited to vaginal bleeding, contractions, leaking of fluid and fetal movement were reviewed in detail with the patient.  Please refer to After Visit Summary for other counseling recommendations.   Return in about 4 weeks (around 01/26/2021) for [facility], in person.   Mariel Aloe, MD Faculty Attending [facility]

## 2020-12-31 LAB — AFP, SERUM, OPEN SPINA BIFIDA
AFP MoM: 1.48
AFP Value: 98.8 ng/mL
Gest. Age on Collection Date: 19.1 weeks
Maternal Age At EDD: 25.6 yr
OSBR Risk 1 IN: 5772
Test Results:: NEGATIVE
Weight: 122 [lb_av]

## 2021-01-26 ENCOUNTER — Encounter: Payer: Medicaid Other | Admitting: Obstetrics and Gynecology

## 2021-02-01 ENCOUNTER — Other Ambulatory Visit: Payer: Self-pay

## 2021-02-01 DIAGNOSIS — B9689 Other specified bacterial agents as the cause of diseases classified elsewhere: Secondary | ICD-10-CM

## 2021-02-05 ENCOUNTER — Ambulatory Visit: Payer: Medicaid Other | Attending: Certified Nurse Midwife | Admitting: Physical Therapy

## 2021-02-06 ENCOUNTER — Inpatient Hospital Stay (HOSPITAL_COMMUNITY)
Admission: AD | Admit: 2021-02-06 | Discharge: 2021-02-06 | Disposition: A | Discharge status: Home / Self Care | Payer: Medicaid Other | Attending: Obstetrics & Gynecology | Admitting: Obstetrics & Gynecology

## 2021-02-06 ENCOUNTER — Other Ambulatory Visit: Payer: Self-pay

## 2021-02-06 ENCOUNTER — Encounter (HOSPITAL_COMMUNITY): Payer: Self-pay | Admitting: Obstetrics & Gynecology

## 2021-02-06 DIAGNOSIS — O99012 Anemia complicating pregnancy, second trimester: Secondary | ICD-10-CM | POA: Diagnosis not present

## 2021-02-06 DIAGNOSIS — O368120 Decreased fetal movements, second trimester, not applicable or unspecified: Secondary | ICD-10-CM | POA: Insufficient documentation

## 2021-02-06 DIAGNOSIS — R519 Headache, unspecified: Secondary | ICD-10-CM | POA: Diagnosis not present

## 2021-02-06 DIAGNOSIS — O99891 Other specified diseases and conditions complicating pregnancy: Secondary | ICD-10-CM | POA: Diagnosis not present

## 2021-02-06 DIAGNOSIS — O26892 Other specified pregnancy related conditions, second trimester: Secondary | ICD-10-CM | POA: Diagnosis not present

## 2021-02-06 DIAGNOSIS — M549 Dorsalgia, unspecified: Secondary | ICD-10-CM | POA: Diagnosis not present

## 2021-02-06 DIAGNOSIS — Z7982 Long term (current) use of aspirin: Secondary | ICD-10-CM | POA: Diagnosis not present

## 2021-02-06 DIAGNOSIS — D649 Anemia, unspecified: Secondary | ICD-10-CM

## 2021-02-06 DIAGNOSIS — Z3A24 24 weeks gestation of pregnancy: Secondary | ICD-10-CM | POA: Diagnosis not present

## 2021-02-06 DIAGNOSIS — Z3689 Encounter for other specified antenatal screening: Secondary | ICD-10-CM

## 2021-02-06 LAB — URINALYSIS, ROUTINE W REFLEX MICROSCOPIC
Bilirubin Urine: NEGATIVE
Glucose, UA: NEGATIVE mg/dL
Hgb urine dipstick: NEGATIVE
Ketones, ur: NEGATIVE mg/dL
Nitrite: NEGATIVE
Protein, ur: NEGATIVE mg/dL
Specific Gravity, Urine: 1.012 (ref 1.005–1.030)
pH: 7 (ref 5.0–8.0)

## 2021-02-06 LAB — CBC WITH DIFFERENTIAL/PLATELET
Abs Immature Granulocytes: 0.06 10*3/uL (ref 0.00–0.07)
Basophils Absolute: 0 10*3/uL (ref 0.0–0.1)
Basophils Relative: 0 %
Eosinophils Absolute: 0 10*3/uL (ref 0.0–0.5)
Eosinophils Relative: 0 %
HCT: 27.6 % — ABNORMAL LOW (ref 36.0–46.0)
Hemoglobin: 8.8 g/dL — ABNORMAL LOW (ref 12.0–15.0)
Immature Granulocytes: 1 %
Lymphocytes Relative: 17 %
Lymphs Abs: 1.5 10*3/uL (ref 0.7–4.0)
MCH: 28.9 pg (ref 26.0–34.0)
MCHC: 31.9 g/dL (ref 30.0–36.0)
MCV: 90.8 fL (ref 80.0–100.0)
Monocytes Absolute: 0.4 10*3/uL (ref 0.1–1.0)
Monocytes Relative: 5 %
Neutro Abs: 6.9 10*3/uL (ref 1.7–7.7)
Neutrophils Relative %: 77 %
Platelets: 259 10*3/uL (ref 150–400)
RBC: 3.04 MIL/uL — ABNORMAL LOW (ref 3.87–5.11)
RDW: 13.7 % (ref 11.5–15.5)
WBC: 8.9 10*3/uL (ref 4.0–10.5)
nRBC: 0 % (ref 0.0–0.2)

## 2021-02-06 LAB — HEPATITIS B SURFACE ANTIGEN: Hepatitis B Surface Ag: NONREACTIVE

## 2021-02-06 LAB — COMPREHENSIVE METABOLIC PANEL
ALT: 11 U/L (ref 0–44)
AST: 15 U/L (ref 15–41)
Albumin: 2.8 g/dL — ABNORMAL LOW (ref 3.5–5.0)
Alkaline Phosphatase: 55 U/L (ref 38–126)
Anion gap: 8 (ref 5–15)
BUN: 7 mg/dL (ref 6–20)
CO2: 22 mmol/L (ref 22–32)
Calcium: 8.4 mg/dL — ABNORMAL LOW (ref 8.9–10.3)
Chloride: 104 mmol/L (ref 98–111)
Creatinine, Ser: 0.51 mg/dL (ref 0.44–1.00)
GFR, Estimated: >60 mL/min (ref >60)
Glucose, Bld: 73 mg/dL (ref 70–99)
Potassium: 3.7 mmol/L (ref 3.5–5.1)
Sodium: 134 mmol/L — ABNORMAL LOW (ref 135–145)
Total Bilirubin: 0.8 mg/dL (ref 0.3–1.2)
Total Protein: 6.5 g/dL (ref 6.5–8.1)

## 2021-02-06 LAB — ABO/RH: ABO/RH(D): O POS

## 2021-02-06 LAB — PROTEIN / CREATININE RATIO, URINE
Creatinine, Urine: 108.94 mg/dL
Protein Creatinine Ratio: 0.11 mg/mg{Cre} (ref 0.00–0.15)
Total Protein, Urine: 12 mg/dL

## 2021-02-06 LAB — HEPATITIS C ANTIBODY: HCV Ab: NONREACTIVE

## 2021-02-06 MED ORDER — CYCLOBENZAPRINE HCL 10 MG PO TABS: 10.0000 mg | ORAL_TABLET | Freq: Three times a day (TID) | ORAL | 0 refills | Status: DC | PRN

## 2021-02-06 MED ORDER — CYCLOBENZAPRINE HCL 5 MG PO TABS
10.0000 mg | ORAL_TABLET | Freq: Once | ORAL | Status: AC
  Administered 2021-02-06: 10 mg via ORAL
  Filled 2021-02-06: qty 2

## 2021-02-06 MED ORDER — FERROUS SULFATE 325 (65 FE) MG PO TABS: 325.0000 mg | ORAL_TABLET | ORAL | 3 refills | Status: DC

## 2021-02-06 MED ORDER — ACETAMINOPHEN 500 MG PO TABS
1000.0000 mg | ORAL_TABLET | Freq: Once | ORAL | Status: AC
  Administered 2021-02-06: 1000 mg via ORAL
  Filled 2021-02-06: qty 2

## 2021-02-06 NOTE — Discharge Instructions (Signed)

## 2021-02-06 NOTE — MAU Note (Signed)
Pt stated she has not felt well today ha been fatigued, has a headache and back pain and has had energy to do much. Also reports she has not felt baby move much all day.

## 2021-02-06 NOTE — MAU Provider Note (Signed)
History     CSN: [id number]  Arrival date and time: 02/06/21 5809   Event Date/Time   First Provider Initiated Contact with Patient 02/06/21 1946      Chief Complaint  Patient presents with   Fatigue   Back Pain   Headache   Decreased Fetal Movement   Kristin Bruce is a 25 y.o. [id number] at 60w5dwho presents to MAU for feeling decreased fetal movement beginning earlier this morning. Patient reports she has not been feeling well today because she has been crying all day because of difficulties at home. Patient reports return of normal fetal movement on arrival to MAU.  Patient also endorses 8/10 HA at this time, has not taken anything for it.  Pt denies VB, LOF, ctx, vaginal discharge/odor/itching.  Allergies? NKDA Prenatal care provider/next appt? Pt reports she missed appointment yesterday, message sent to [facility] to help patient get scheduled for next appt   OB History     Gravida  4   Para  2   Term  1   Preterm  1   AB  1   Living  2      SAB  1   IAB  0   Ectopic  0   Multiple  0   Live Births  2           Past Medical History:  Diagnosis Date   Anemia    Depression    HSV (herpes simplex virus) anogenital infection    Suicidal thoughts    during pregnancy   Wears glasses     Past Surgical History:  Procedure Laterality Date   INCISION AND DRAINAGE ABSCESS N/A 05/13/2018   Procedure: INCISION AND DRAINAGE, PILONIDAL  ABSCESS;  Surgeon: WJudeth Horn MD;  [place] [facility]  Service: General;  Laterality: N/A;   INCISION AND DRAINAGE PERIRECTAL ABSCESS N/A 04/01/2018   Procedure: IRRIGATION AND DEBRIDEMENT PERIRECTAL ABSCESS;  Surgeon: TGeorganna Skeans MD;  [place] [facility]  Service: General;  Laterality: N/A;   NO PAST SURGERIES     PILONIDAL CYST EXCISION N/A 07/20/2018   Procedure: EXCISION OF PILONIDAL DISEASE;  Surgeon: WIleana Roup MD;  [place] [facility]  Service: General;  Laterality: N/A;     Family History  Problem Relation Age of Onset   Diabetes Mother    Hypertension Mother    Heart failure Mother    Diabetes Maternal Grandfather     Social History   Tobacco Use   Smoking status: Never   Smokeless tobacco: Never  Vaping Use   Vaping Use: Never used  Substance Use Topics   Alcohol use: Not Currently    Comment: not since pregnancy   Drug use: Not Currently    Types: Marijuana    Comment: last use was about 10 weeks ago    Allergies: No Known Allergies  Medications Prior to Admission  Medication Sig Dispense Refill Last Dose   aspirin 81 MG chewable tablet Chew 1 tablet (81 mg total) by mouth daily.   02/05/2021   Prenatal Vit-Fe Fumarate-FA (PREPLUS) 27-1 MG TABS Take 1 tablet by mouth daily.   Past Week   promethazine (PHENERGAN) 25 MG tablet Take 1 tablet (25 mg total) by mouth every 6 (six) hours as needed for nausea or vomiting. 30 tablet 0 Past Week   acetaminophen (TYLENOL) 500 MG tablet Take 1,000 mg by mouth every 6 (six) hours as needed.      Blood Pressure Monitoring (BLOOD  PRESSURE KIT) DEVI 1 Device by Does not apply route as needed. 1 each 0    metroNIDAZOLE (METROGEL VAGINAL) 0.75 % vaginal gel Place 1 Applicatorful vaginally 2 (two) times daily. 70 g 0     Review of Systems  Constitutional:  Negative for chills, diaphoresis, fatigue and fever.  Eyes:  Negative for visual disturbance.  Respiratory:  Negative for shortness of breath.   Cardiovascular:  Negative for chest pain.  Gastrointestinal:  Negative for abdominal pain, constipation, diarrhea, nausea and vomiting.  Genitourinary:  Negative for dysuria, flank pain, frequency, pelvic pain, urgency, vaginal bleeding and vaginal discharge.  Neurological:  Positive for headaches. Negative for dizziness, weakness and light-headedness.   Physical Exam   Blood pressure 125/74, pulse (!) 102, temperature 98 F (36.7 C), resp. rate 18, height 5' (1.524 m), weight 56.2 kg, last menstrual  period 06/25/2020.  Patient Vitals for the past 24 hrs:  BP Temp Pulse Resp Height Weight  02/06/21 1858 125/74 98 F (36.7 C) (!) 102 18 5' (1.524 m) 56.2 kg   Physical Exam Vitals and nursing note reviewed.  Constitutional:      General: She is not in acute distress.    Appearance: Normal appearance. She is not ill-appearing, toxic-appearing or diaphoretic.  HENT:     Head: Normocephalic and atraumatic.  Pulmonary:     Effort: Pulmonary effort is normal.  Neurological:     Mental Status: She is alert and oriented to person, place, and time.  Psychiatric:        Mood and Affect: Mood normal.        Behavior: Behavior normal.        Thought Content: Thought content normal.        Judgment: Judgment normal.   Results for orders placed or performed during the hospital encounter of 02/06/21 (from the past 24 hour(s))  Urinalysis, Routine w reflex microscopic Urine, Clean Catch     Status: Abnormal   Collection Time: 02/06/21  7:01 PM  Result Value Ref Range   Color, Urine YELLOW YELLOW   APPearance HAZY (A) CLEAR   Specific Gravity, Urine 1.012 1.005 - 1.030   pH 7.0 5.0 - 8.0   Glucose, UA NEGATIVE NEGATIVE mg/dL   Hgb urine dipstick NEGATIVE NEGATIVE   Bilirubin Urine NEGATIVE NEGATIVE   Ketones, ur NEGATIVE NEGATIVE mg/dL   Protein, ur NEGATIVE NEGATIVE mg/dL   Nitrite NEGATIVE NEGATIVE   Leukocytes,Ua TRACE (A) NEGATIVE   RBC / HPF 0-5 0 - 5 RBC/hpf   WBC, UA 6-10 0 - 5 WBC/hpf   Bacteria, UA RARE (A) NONE SEEN   Squamous Epithelial / LPF 6-10 0 - 5   Mucus PRESENT   ABO/Rh     Status: None   Collection Time: 02/06/21  8:09 PM  Result Value Ref Range   ABO/RH(D) O POS    No rh immune globuloin      NOT A RH IMMUNE GLOBULIN CANDIDATE, PT RH POSITIVE Performed at [facility], [address] [place], [place] [address]   CBC with Differential/Platelet     Status: Abnormal   Collection Time: 02/06/21  8:09 PM  Result Value Ref Range   WBC 8.9 4.0 - 10.5 K/uL    RBC 3.04 (L) 3.87 - 5.11 MIL/uL   Hemoglobin 8.8 (L) 12.0 - 15.0 g/dL   HCT 27.6 (L) 36.0 - 46.0 %   MCV 90.8 80.0 - 100.0 fL   MCH 28.9 26.0 - 34.0 pg  MCHC 31.9 30.0 - 36.0 g/dL   RDW 13.7 11.5 - 15.5 %   Platelets 259 150 - 400 K/uL   nRBC 0.0 0.0 - 0.2 %   Neutrophils Relative % 77 %   Neutro Abs 6.9 1.7 - 7.7 K/uL   Lymphocytes Relative 17 %   Lymphs Abs 1.5 0.7 - 4.0 K/uL   Monocytes Relative 5 %   Monocytes Absolute 0.4 0.1 - 1.0 K/uL   Eosinophils Relative 0 %   Eosinophils Absolute 0.0 0.0 - 0.5 K/uL   Basophils Relative 0 %   Basophils Absolute 0.0 0.0 - 0.1 K/uL   Immature Granulocytes 1 %   Abs Immature Granulocytes 0.06 0.00 - 0.07 K/uL  Comprehensive metabolic panel     Status: Abnormal   Collection Time: 02/06/21  8:09 PM  Result Value Ref Range   Sodium 134 (L) 135 - 145 mmol/L   Potassium 3.7 3.5 - 5.1 mmol/L   Chloride 104 98 - 111 mmol/L   CO2 22 22 - 32 mmol/L   Glucose, Bld 73 70 - 99 mg/dL   BUN 7 6 - 20 mg/dL   Creatinine, Ser 0.51 0.44 - 1.00 mg/dL   Calcium 8.4 (L) 8.9 - 10.3 mg/dL   Total Protein 6.5 6.5 - 8.1 g/dL   Albumin 2.8 (L) 3.5 - 5.0 g/dL   AST 15 15 - 41 U/L   ALT 11 0 - 44 U/L   Alkaline Phosphatase 55 38 - 126 U/L   Total Bilirubin 0.8 0.3 - 1.2 mg/dL   GFR, Estimated >60 >60 mL/min   Anion gap 8 5 - 15   No results found.  MAU Course  Procedures  MDM -DFM since this morning -return of normal movement upon coming to MAU -clicker given to patient to record fetal movement, patient pressed button 17 times in 36 minutes -EFM: reactive       -baseline: 150       -variability: moderate       -accels: present, 10x10       -decels: absent       -TOCO: quiet -Flexeril 44m and Tylenol 10095mgiven for HA, after administration, pt reports HA now 2/10 down from 7/10 and back pain that was reported to RN but not to provider is resolved -needed prenatal labs drawn per sticky note -UA: hazy/trace leuks/rare bacteria, sending urine  for culture -CBC: H/H 8.8/27.6 -CMP: WNL -consulted with Dr. EuElonda Huskypt does not need iron infusion at this time, will try oral iron at home first -pt confirms she is not currently on oral iron -pt discharged to home in stable condition  Orders Placed This Encounter  Procedures   Urinalysis, Routine w reflex microscopic Urine, Clean Catch    Standing Status:   Standing    Number of Occurrences:   1   Rubella screen    Standing Status:   Standing    Number of Occurrences:   1   Hepatitis B surface antigen    Standing Status:   Standing    Number of Occurrences:   1   Hepatitis C antibody    Standing Status:   Standing    Number of Occurrences:   1   CBC with Differential/Platelet    Standing Status:   Standing    Number of Occurrences:   1   Comprehensive metabolic panel    Standing Status:   Standing    Number of Occurrences:   1   Protein /  creatinine ratio, urine    Standing Status:   Standing    Number of Occurrences:   1   ABO/Rh    Standing Status:   Standing    Number of Occurrences:   1   Discharge patient    Order Specific Question:   Discharge disposition    Answer:   01-Home or Self Care [1]    Order Specific Question:   Discharge patient date    Answer:   02/06/2021   Meds ordered this encounter  Medications   cyclobenzaprine (FLEXERIL) tablet 10 mg   acetaminophen (TYLENOL) tablet 1,000 mg   ferrous sulfate 325 (65 FE) MG tablet    Sig: Take 1 tablet (325 mg total) by mouth every other day.    Dispense:  30 tablet    Refill:  3    Order Specific Question:   Supervising Provider    Answer:   Janyth Pupa [[id number]]   cyclobenzaprine (FLEXERIL) 10 MG tablet    Sig: Take 1 tablet (10 mg total) by mouth 3 (three) times daily as needed.    Dispense:  30 tablet    Refill:  0    Order Specific Question:   Supervising Provider    Answer:   Janyth Pupa [[id number]]   Assessment and Plan   1. Pregnancy headache in second trimester   2. Anemia during  pregnancy in second trimester   3. [redacted] weeks gestation of pregnancy   4. NST (non-stress test) reactive   5. Decreased fetal movements in second trimester, single or unspecified fetus    Allergies as of 02/06/2021   No Known Allergies      Medication List     TAKE these medications    acetaminophen 500 MG tablet Commonly known as: TYLENOL Take 1,000 mg by mouth every 6 (six) hours as needed.   aspirin 81 MG chewable tablet Chew 1 tablet (81 mg total) by mouth daily.   Blood Pressure Kit Devi 1 Device by Does not apply route as needed.   cyclobenzaprine 10 MG tablet Commonly known as: FLEXERIL Take 1 tablet (10 mg total) by mouth 3 (three) times daily as needed.   ferrous sulfate 325 (65 FE) MG tablet Take 1 tablet (325 mg total) by mouth every other day.   metroNIDAZOLE 0.75 % vaginal gel Commonly known as: METROGEL VAGINAL Place 1 Applicatorful vaginally 2 (two) times daily.   PrePLUS 27-1 MG Tabs Take 1 tablet by mouth daily.   promethazine 25 MG tablet Commonly known as: PHENERGAN Take 1 tablet (25 mg total) by mouth every 6 (six) hours as needed for nausea or vomiting.        -RX ferrous sulfate -RX Flexeril per pt request -return MAU precautions given -pt discharged to home in stable condition  Gerrie Nordmann Langley Flatley 02/06/2021, 9:38 PM

## 2021-02-09 LAB — RUBELLA SCREEN: Rubella: 3.21 index (ref >0.99)

## 2021-02-11 ENCOUNTER — Ambulatory Visit: Payer: Medicaid Other | Admitting: Physical Therapy

## 2021-02-11 ENCOUNTER — Other Ambulatory Visit: Payer: Self-pay | Admitting: Lactation Services

## 2021-02-11 MED ORDER — PROMETHAZINE HCL 25 MG PO TABS: 25.0000 mg | ORAL_TABLET | Freq: Four times a day (QID) | ORAL | 0 refills | Status: DC | PRN

## 2021-03-03 ENCOUNTER — Ambulatory Visit (INDEPENDENT_AMBULATORY_CARE_PROVIDER_SITE_OTHER): Payer: Medicaid Other | Admitting: Family Medicine

## 2021-03-03 ENCOUNTER — Other Ambulatory Visit: Payer: Self-pay

## 2021-03-03 VITALS — BP 116/82 | HR 87 | Wt 128.6 lb

## 2021-03-03 DIAGNOSIS — O0990 Supervision of high risk pregnancy, unspecified, unspecified trimester: Secondary | ICD-10-CM

## 2021-03-03 DIAGNOSIS — Z8759 Personal history of other complications of pregnancy, childbirth and the puerperium: Secondary | ICD-10-CM

## 2021-03-03 DIAGNOSIS — O99019 Anemia complicating pregnancy, unspecified trimester: Secondary | ICD-10-CM

## 2021-03-03 MED ORDER — ASPIRIN 81 MG PO CHEW: 81.0000 mg | CHEWABLE_TABLET | Freq: Every day | ORAL | Status: DC

## 2021-03-03 MED ORDER — PROMETHAZINE HCL 25 MG PO TABS: 25.0000 mg | ORAL_TABLET | Freq: Four times a day (QID) | ORAL | 0 refills | Status: DC | PRN

## 2021-03-03 MED ORDER — PREPLUS 27-1 MG PO TABS: 1.0000 | ORAL_TABLET | Freq: Every day | ORAL | 2 refills | Status: DC

## 2021-03-03 NOTE — Progress Notes (Signed)
Patient stated that she has been having pelvic pain and pressure for the past couple of weeks. Patient wants to know if she could get a "note for work". Also stated that she is in need of a refill on baby Asprin, prenatals and phenergan.   [name], CMA    03/03/21

## 2021-03-03 NOTE — Progress Notes (Signed)
   Subjective:  Kristin Bruce is a 25 y.o. [phone] at [redacted]w[redacted]d being seen today for ongoing prenatal care.  She is currently monitored for the following issues for this low-risk pregnancy and has Adjustment disorder with depressed mood; Anemia of pregnancy; Herpes simplex type 1 infection; History of pre-eclampsia; Supervision of high risk pregnancy, antepartum; Migraine; and Alpha thalassemia silent carrier on their problem list.  Patient reports fatigue, no bleeding, no contractions, no cramping, and no leaking.  Contractions: Not present. Vag. Bleeding: None.  Movement: Present. Denies leaking of fluid.   The following portions of the patient's history were reviewed and updated as appropriate: allergies, current medications, past family history, past medical history, past social history, past surgical history and problem list. Problem list updated.  Objective:   Vitals:   03/03/21 1416  BP: 116/82  Pulse: 87  Weight: 128 lb 9.6 oz (58.3 kg)    Fetal Status: Fetal Heart Rate (bpm): 133 Fundal Height: 28 cm Movement: Present     General:  Alert, oriented and cooperative. Patient is in no acute distress.  Skin: Skin is warm and dry. No rash noted.   Cardiovascular: Normal heart rate noted  Respiratory: Normal respiratory effort, no problems with respiration noted  Abdomen: Soft, gravid, appropriate for gestational age. Pain/Pressure: Present     Pelvic: Vag. Bleeding: None     Cervical exam deferred        Extremities: Normal range of motion.  Edema: None  Mental Status: Normal mood and affect. Normal behavior. Normal judgment and thought content.    Assessment and Plan:  Pregnancy: [id number] at [redacted]w[redacted]d  1. Supervision of high risk pregnancy, antepartum Doing well, no obstetric concerns at this time. BP normotensive, heart tones normal, fundal height appropriate. Will come back for GTT, RPR, HIV when fasting. Would like refills of phenergan for nausea (only needing occasionally) -  Glucose Tolerance, 2 Hours w/1 Hour; Future - RPR; Future - HIV Antibody (routine testing w rflx); Future - aspirin 81 MG chewable tablet; Chew 1 tablet (81 mg total) by mouth daily. - Phenergan refilled - Patient deferred Tdap to next visit  2. History of pre-eclampsia No signs of elevated BP at this time. Would like ASA refills  - aspirin 81 MG chewable tablet; Chew 1 tablet (81 mg total) by mouth daily.  3. Antepartum anemia H/H 8.8/27.6, offered IV Venofer and patient would like to proceed - IV Venofer orders placed and CMA to schedule appt  Preterm labor symptoms and general obstetric precautions including but not limited to vaginal bleeding, contractions, leaking of fluid and fetal movement were reviewed in detail with the patient. Please refer to After Visit Summary for other counseling recommendations.  Return in about 1 week (around 03/10/2021) for Lab visit, 2 week F/up LROB, any provider.   Warner Mccreedy, MD, MPH OB Fellow, Faculty Practice

## 2021-03-11 ENCOUNTER — Other Ambulatory Visit: Payer: Self-pay

## 2021-03-11 ENCOUNTER — Other Ambulatory Visit: Payer: Medicaid Other

## 2021-03-11 DIAGNOSIS — O0990 Supervision of high risk pregnancy, unspecified, unspecified trimester: Secondary | ICD-10-CM

## 2021-03-12 LAB — GLUCOSE TOLERANCE, 2 HOURS W/ 1HR
Glucose, 1 hour: 145 mg/dL (ref 65–179)
Glucose, 2 hour: 110 mg/dL (ref 65–152)
Glucose, Fasting: 75 mg/dL (ref 65–91)

## 2021-03-12 LAB — RPR: RPR Ser Ql: NONREACTIVE

## 2021-03-12 LAB — HIV ANTIBODY (ROUTINE TESTING W REFLEX): HIV Screen 4th Generation wRfx: NONREACTIVE

## 2021-03-13 ENCOUNTER — Other Ambulatory Visit: Payer: Self-pay | Admitting: Family Medicine

## 2021-03-13 DIAGNOSIS — O0990 Supervision of high risk pregnancy, unspecified, unspecified trimester: Secondary | ICD-10-CM

## 2021-03-18 ENCOUNTER — Ambulatory Visit (INDEPENDENT_AMBULATORY_CARE_PROVIDER_SITE_OTHER): Payer: Medicaid Other | Admitting: Certified Nurse Midwife

## 2021-03-18 ENCOUNTER — Other Ambulatory Visit: Payer: Self-pay

## 2021-03-18 VITALS — BP 112/79 | HR 100 | Wt 132.0 lb

## 2021-03-18 DIAGNOSIS — O0993 Supervision of high risk pregnancy, unspecified, third trimester: Secondary | ICD-10-CM

## 2021-03-18 DIAGNOSIS — D509 Iron deficiency anemia, unspecified: Secondary | ICD-10-CM

## 2021-03-18 DIAGNOSIS — O99019 Anemia complicating pregnancy, unspecified trimester: Secondary | ICD-10-CM

## 2021-03-18 DIAGNOSIS — Z23 Encounter for immunization: Secondary | ICD-10-CM | POA: Diagnosis not present

## 2021-03-18 DIAGNOSIS — Z3A30 30 weeks gestation of pregnancy: Secondary | ICD-10-CM

## 2021-03-19 ENCOUNTER — Other Ambulatory Visit (HOSPITAL_COMMUNITY): Payer: Self-pay | Admitting: *Deleted

## 2021-03-20 NOTE — Progress Notes (Signed)
   PRENATAL VISIT NOTE  Subjective:  Kristin Bruce is a 25 y.o. [phone] at [redacted]w[redacted]d being seen today for ongoing prenatal care.  She is currently monitored for the following issues for this low-risk pregnancy and has Adjustment disorder with depressed mood; Anemia of pregnancy; Herpes simplex type 1 infection; History of pre-eclampsia; Supervision of high risk pregnancy, antepartum; Migraine; and Alpha thalassemia silent carrier on their problem list.  Patient reports no complaints.  Contractions: Not present. Vag. Bleeding: None.  Movement: Present. Denies leaking of fluid.   The following portions of the patient's history were reviewed and updated as appropriate: allergies, current medications, past family history, past medical history, past social history, past surgical history and problem list.   Objective:   Vitals:   03/18/21 1605  BP: 112/79  Pulse: 100  Weight: 132 lb (59.9 kg)    Fetal Status: Fetal Heart Rate (bpm): 142 Fundal Height: 30 cm Movement: Present     General:  Alert, oriented and cooperative. Patient is in no acute distress.  Skin: Skin is warm and dry. No rash noted.   Cardiovascular: Normal heart rate noted  Respiratory: Normal respiratory effort, no problems with respiration noted  Abdomen: Soft, gravid, appropriate for gestational age.  Pain/Pressure: Present     Pelvic: Cervical exam deferred        Extremities: Normal range of motion.  Edema: None  Mental Status: Normal mood and affect. Normal behavior. Normal judgment and thought content.   Assessment and Plan:  Pregnancy: [id number] at [redacted]w[redacted]d 1. Supervision of high risk pregnancy in third trimester - Doing well, feeling regular and vigorous fetal movement  2. [redacted] weeks gestation of pregnancy - Routine OB care - Reviewed 3rd trimester labs - Tdap vaccine greater than or equal to 7yo IM  3. Iron deficiency anemia during pregnancy - Venofer infusion scheduled  Preterm labor symptoms and general  obstetric precautions including but not limited to vaginal bleeding, contractions, leaking of fluid and fetal movement were reviewed in detail with the patient. Please refer to After Visit Summary for other counseling recommendations.   Return in about 2 weeks (around 04/01/2021) for IN-PERSON, LOB.  Future Appointments  Date Time Provider Department Center  03/26/2021  8:00 AM [id number] None  04/06/2021  3:35 PM Judeth Horn, NP [facility]    Bernerd Limbo, CNM

## 2021-03-26 ENCOUNTER — Other Ambulatory Visit: Payer: Self-pay

## 2021-03-26 ENCOUNTER — Ambulatory Visit (HOSPITAL_COMMUNITY)
Admission: RE | Admit: 2021-03-26 | Discharge: 2021-03-26 | Disposition: A | Discharge status: Home / Self Care | Payer: Medicaid Other | Source: Ambulatory Visit | Attending: Family Medicine | Admitting: Family Medicine

## 2021-03-26 DIAGNOSIS — D509 Iron deficiency anemia, unspecified: Secondary | ICD-10-CM | POA: Diagnosis not present

## 2021-03-26 MED ORDER — EPINEPHRINE PF 1 MG/ML IJ SOLN: 0.3000 mg | Freq: Once | INTRAMUSCULAR | Status: DC | PRN

## 2021-03-26 MED ORDER — SODIUM CHLORIDE 0.9 % IV BOLUS: 500.0000 mL | Freq: Once | INTRAVENOUS | Status: DC | PRN

## 2021-03-26 MED ORDER — DIPHENHYDRAMINE HCL 50 MG/ML IJ SOLN: 25.0000 mg | Freq: Once | INTRAMUSCULAR | Status: DC | PRN

## 2021-03-26 MED ORDER — SODIUM CHLORIDE 0.9 % IV SOLN: INTRAVENOUS | Status: DC | PRN

## 2021-03-26 MED ORDER — ALBUTEROL SULFATE (2.5 MG/3ML) 0.083% IN NEBU: 2.5000 mg | INHALATION_SOLUTION | Freq: Once | RESPIRATORY_TRACT | Status: DC | PRN

## 2021-03-26 MED ORDER — SODIUM CHLORIDE 0.9 % IV SOLN
500.0000 mg | Freq: Once | INTRAVENOUS | Status: AC
  Administered 2021-03-26: 500 mg via INTRAVENOUS
  Filled 2021-03-26: qty 25

## 2021-03-26 MED ORDER — METHYLPREDNISOLONE SODIUM SUCC 125 MG IJ SOLR: 125.0000 mg | Freq: Once | INTRAMUSCULAR | Status: DC | PRN

## 2021-04-06 ENCOUNTER — Telehealth (INDEPENDENT_AMBULATORY_CARE_PROVIDER_SITE_OTHER): Payer: Medicaid Other | Admitting: Student

## 2021-04-06 VITALS — BP 113/67 | HR 101 | Wt 138.0 lb

## 2021-04-06 DIAGNOSIS — B009 Herpesviral infection, unspecified: Secondary | ICD-10-CM

## 2021-04-06 DIAGNOSIS — D649 Anemia, unspecified: Secondary | ICD-10-CM

## 2021-04-06 DIAGNOSIS — O0990 Supervision of high risk pregnancy, unspecified, unspecified trimester: Secondary | ICD-10-CM

## 2021-04-06 DIAGNOSIS — O99013 Anemia complicating pregnancy, third trimester: Secondary | ICD-10-CM

## 2021-04-06 DIAGNOSIS — O98513 Other viral diseases complicating pregnancy, third trimester: Secondary | ICD-10-CM

## 2021-04-06 DIAGNOSIS — Z3A33 33 weeks gestation of pregnancy: Secondary | ICD-10-CM

## 2021-04-06 MED ORDER — VALACYCLOVIR HCL 500 MG PO TABS: 500.0000 mg | ORAL_TABLET | Freq: Two times a day (BID) | ORAL | 2 refills | Status: DC

## 2021-04-06 NOTE — Progress Notes (Signed)
I connected with Kristin Bruce 04/06/21 at  3:35 PM EDT by: MyChart video and verified that I am speaking with the correct person using two identifiers.  Patient is located at home and provider is located at [facility].     The purpose of this virtual visit is to provide medical care while limiting exposure to the novel coronavirus. I discussed the limitations, risks, security and privacy concerns of performing an evaluation and management service by MyChart video and the availability of in person appointments. I also discussed with the patient that there may be a patient responsible charge related to this service. By engaging in this virtual visit, you consent to the provision of healthcare.  Additionally, you authorize for your insurance to be billed for the services provided during this visit.  The patient expressed understanding and agreed to proceed.  The following staff members participated in the virtual visit:  Zwaye Banton CMA    PRENATAL VISIT NOTE  Subjective:  Kristin Bruce is a 25 y.o. [id number] at [redacted]w[redacted]d  for phone visit for ongoing prenatal care.  She is currently monitored for the following issues for this high-risk pregnancy and has Adjustment disorder with depressed mood; Anemia of pregnancy; Herpes simplex type 1 infection; History of pre-eclampsia; Supervision of high risk pregnancy, antepartum; Migraine; and Alpha thalassemia silent carrier on their problem list.  Patient reports no complaints.  Contractions: Not present. Vag. Bleeding: None.  Movement: Present. Denies leaking of fluid.   The following portions of the patient's history were reviewed and updated as appropriate: allergies, current medications, past family history, past medical history, past social history, past surgical history and problem list.   Objective:   Vitals:   04/06/21 1557  BP: 113/67  Pulse: (!) 101  Weight: 138 lb (62.6 kg)   Self-Obtained  Fetal Status:     Movement: Present     Assessment  and Plan:  Pregnancy: [id number] at [redacted]w[redacted]d 1. Supervision of high risk pregnancy, antepartum -doing well. Discussed upcoming appointments.   2. Herpes simplex type 1 infection -denies ever having vaginal outbreak but is reportedly on her chart. She is agreeable to suppressive therapy. Will start suppressive therapy now due to history of a 36 week delivery.   - valACYclovir (VALTREX) 500 MG tablet; Take 1 tablet (500 mg total) by mouth 2 (two) times daily.  Dispense: 60 tablet; Refill: 2  3. Anemia during pregnancy in third trimester -patient had iron infusion on 9/8. Will recheck hemoglobin at next visit  4. [redacted] weeks gestation of pregnancy   Preterm labor symptoms and general obstetric precautions including but not limited to vaginal bleeding, contractions, leaking of fluid and fetal movement were reviewed in detail with the patient.  No follow-ups on file.  No future appointments.   Time spent on virtual visit: 6 minutes  Judeth Horn, NP

## 2021-04-06 NOTE — Progress Notes (Signed)
I connected with  Kristin Bruce on 04/06/21 at  3:35 PM EDT by MyChart virtual video visit and verified that I am speaking with the correct person using two identifiers.   I discussed the limitations, risks, security and privacy concerns of performing an evaluation and management service by telephone and the availability of in person appointments. I also discussed with the patient that there may be a patient responsible charge related to this service. The patient expressed understanding and agreed to proceed.  Guy Begin, CMA 04/06/2021  3:55 PM

## 2021-04-23 ENCOUNTER — Ambulatory Visit (INDEPENDENT_AMBULATORY_CARE_PROVIDER_SITE_OTHER): Payer: Medicaid Other | Admitting: Obstetrics & Gynecology

## 2021-04-23 ENCOUNTER — Other Ambulatory Visit: Payer: Self-pay

## 2021-04-23 VITALS — BP 125/89 | HR 91 | Wt 137.9 lb

## 2021-04-23 DIAGNOSIS — Z5941 Food insecurity: Secondary | ICD-10-CM

## 2021-04-23 DIAGNOSIS — O99013 Anemia complicating pregnancy, third trimester: Secondary | ICD-10-CM

## 2021-04-23 DIAGNOSIS — Z3A35 35 weeks gestation of pregnancy: Secondary | ICD-10-CM

## 2021-04-23 DIAGNOSIS — O0990 Supervision of high risk pregnancy, unspecified, unspecified trimester: Secondary | ICD-10-CM

## 2021-04-23 DIAGNOSIS — Z8759 Personal history of other complications of pregnancy, childbirth and the puerperium: Secondary | ICD-10-CM

## 2021-04-23 LAB — CBC
Hematocrit: 28.9 % — ABNORMAL LOW (ref 34.0–46.6)
Hemoglobin: 9 g/dL — ABNORMAL LOW (ref 11.1–15.9)
MCH: 25.6 pg — ABNORMAL LOW (ref 26.6–33.0)
MCHC: 31.1 g/dL — ABNORMAL LOW (ref 31.5–35.7)
MCV: 82 fL (ref 79–97)
Platelets: 236 10*3/uL (ref 150–450)
RBC: 3.51 x10E6/uL — ABNORMAL LOW (ref 3.77–5.28)
RDW: 15 % (ref 11.7–15.4)
WBC: 5.1 10*3/uL (ref 3.4–10.8)

## 2021-04-23 NOTE — Progress Notes (Signed)
   PRENATAL VISIT NOTE  Subjective:  Kristin Bruce is a 25 y.o. [phone] at [redacted]w[redacted]d being seen today for ongoing prenatal care.  She is currently monitored for the following issues for this high-risk pregnancy and has Adjustment disorder with depressed mood; Anemia of pregnancy; Herpes simplex type 1 infection; History of pre-eclampsia; Supervision of high risk pregnancy, antepartum; Migraine; and Alpha thalassemia silent carrier on their problem list.  Patient reports no complaints.  Contractions: Not present. Vag. Bleeding: None.  Movement: Present. Denies leaking of fluid.   The following portions of the patient's history were reviewed and updated as appropriate: allergies, current medications, past family history, past medical history, past social history, past surgical history and problem list.   Objective:   Vitals:   04/23/21 0833  BP: 125/89  Pulse: 91  Weight: 137 lb 14.4 oz (62.6 kg)    Fetal Status: Fetal Heart Rate (bpm): 160   Movement: Present     General:  Alert, oriented and cooperative. Patient is in no acute distress.  Skin: Skin is warm and dry. No rash noted.   Cardiovascular: Normal heart rate noted  Respiratory: Normal respiratory effort, no problems with respiration noted  Abdomen: Soft, gravid, appropriate for gestational age.  Pain/Pressure: Present     Pelvic: Cervical exam deferred        Extremities: Normal range of motion.  Edema: None  Mental Status: Normal mood and affect. Normal behavior. Normal judgment and thought content.   Assessment and Plan:  Pregnancy: [id number] at [redacted]w[redacted]d 1. Food insecurity  - AMBULATORY REFERRAL TO [facility]  2. Anemia during pregnancy in third trimester S/p Fe infusion - CBC  3. Supervision of high risk pregnancy, antepartum H/o preeclampsia  4. History of pre-eclampsia   Preterm labor symptoms and general obstetric precautions including but not limited to vaginal bleeding, contractions, leaking of fluid and  fetal movement were reviewed in detail with the patient. Please refer to After Visit Summary for other counseling recommendations.   Return in about 1 week (around 04/30/2021).  No future appointments.  Scheryl Darter, MD

## 2021-04-27 ENCOUNTER — Other Ambulatory Visit (HOSPITAL_COMMUNITY)
Admission: RE | Admit: 2021-04-27 | Discharge: 2021-04-27 | Disposition: A | Discharge status: Home / Self Care | Payer: Medicaid Other | Source: Ambulatory Visit | Attending: Family Medicine | Admitting: Family Medicine

## 2021-04-27 ENCOUNTER — Encounter: Payer: Self-pay | Admitting: Family Medicine

## 2021-04-27 ENCOUNTER — Ambulatory Visit (INDEPENDENT_AMBULATORY_CARE_PROVIDER_SITE_OTHER): Payer: Medicaid Other | Admitting: Family Medicine

## 2021-04-27 ENCOUNTER — Other Ambulatory Visit: Payer: Self-pay

## 2021-04-27 VITALS — BP 136/88 | HR 93 | Wt 142.7 lb

## 2021-04-27 DIAGNOSIS — O0990 Supervision of high risk pregnancy, unspecified, unspecified trimester: Secondary | ICD-10-CM | POA: Diagnosis not present

## 2021-04-27 DIAGNOSIS — Z3A36 36 weeks gestation of pregnancy: Secondary | ICD-10-CM

## 2021-04-27 DIAGNOSIS — D563 Thalassemia minor: Secondary | ICD-10-CM

## 2021-04-27 NOTE — Progress Notes (Signed)
    PRENATAL VISIT NOTE  Subjective:  Kristin Bruce is a 25 y.o. [phone] at [redacted]w[redacted]d being seen today for ongoing prenatal care.  She is currently monitored for the following issues for this low-risk pregnancy and has Adjustment disorder with depressed mood; Anemia of pregnancy; Herpes simplex type 1 infection; History of pre-eclampsia; Supervision of high risk pregnancy, antepartum; Migraine; and Alpha thalassemia silent carrier on their problem list.  Patient reports no complaints.  Contractions: Not present. Vag. Bleeding: None.  Movement: Present. Denies leaking of fluid.   The following portions of the patient's history were reviewed and updated as appropriate: allergies, current medications, past family history, past medical history, past social history, past surgical history and problem list.   Objective:   Vitals:   04/27/21 1535  BP: 136/88  Pulse: 93  Weight: 142 lb 11.2 oz (64.7 kg)    Fetal Status: Fetal Heart Rate (bpm): 144 Fundal Height: 34 cm Movement: Present  Presentation: Vertex  General:  Alert, oriented and cooperative. Patient is in no acute distress.  Skin: Skin is warm and dry. No rash noted.   Cardiovascular: Normal heart rate noted  Respiratory: Normal respiratory effort, no problems with respiration noted  Abdomen: Soft, gravid, appropriate for gestational age.  Pain/Pressure: Present     Pelvic: Cervical exam performed in the presence of a chaperone Dilation: 1.5 Effacement (%): 50 Station: -2  Extremities: Normal range of motion.  Edema: None  Mental Status: Normal mood and affect. Normal behavior. Normal judgment and thought content.   Assessment and Plan:  Pregnancy: [id number] at [redacted]w[redacted]d 1. Supervision of high risk pregnancy, antepartum Cultures today - GC/Chlamydia probe amp ([place])not at [facility] - Culture, beta strep (group b only)  2. Alpha thalassemia silent carrier   Preterm labor symptoms and general obstetric precautions including but not  limited to vaginal bleeding, contractions, leaking of fluid and fetal movement were reviewed in detail with the patient. Please refer to After Visit Summary for other counseling recommendations.   Return in 1 week (on 05/04/2021).  Future Appointments  Date Time Provider Department Center  05/04/2021  9:15 AM Marylene Land, CNM [facility]  05/11/2021  9:15 AM Warden Fillers, MD [facility]    Reva Bores, MD

## 2021-04-28 LAB — GC/CHLAMYDIA PROBE AMP (~~LOC~~) NOT AT ARMC
Chlamydia: NEGATIVE
Comment: NEGATIVE
Comment: NORMAL
Neisseria Gonorrhea: NEGATIVE

## 2021-04-29 ENCOUNTER — Other Ambulatory Visit: Payer: Self-pay

## 2021-04-29 ENCOUNTER — Inpatient Hospital Stay (HOSPITAL_COMMUNITY): Payer: Medicaid Other | Admitting: Anesthesiology

## 2021-04-29 ENCOUNTER — Encounter (HOSPITAL_COMMUNITY): Payer: Self-pay | Admitting: Obstetrics and Gynecology

## 2021-04-29 ENCOUNTER — Inpatient Hospital Stay (HOSPITAL_COMMUNITY)
Admission: AD | Admit: 2021-04-29 | Discharge: 2021-05-01 | DRG: 806 | Disposition: A | Discharge status: Home / Self Care | Payer: Medicaid Other | Attending: Family Medicine | Admitting: Family Medicine

## 2021-04-29 DIAGNOSIS — A6000 Herpesviral infection of urogenital system, unspecified: Secondary | ICD-10-CM | POA: Diagnosis present

## 2021-04-29 DIAGNOSIS — O9832 Other infections with a predominantly sexual mode of transmission complicating childbirth: Secondary | ICD-10-CM | POA: Diagnosis present

## 2021-04-29 DIAGNOSIS — Z20822 Contact with and (suspected) exposure to covid-19: Secondary | ICD-10-CM | POA: Diagnosis present

## 2021-04-29 DIAGNOSIS — O9081 Anemia of the puerperium: Secondary | ICD-10-CM | POA: Diagnosis not present

## 2021-04-29 DIAGNOSIS — O42913 Preterm premature rupture of membranes, unspecified as to length of time between rupture and onset of labor, third trimester: Secondary | ICD-10-CM | POA: Diagnosis present

## 2021-04-29 DIAGNOSIS — O4290 Premature rupture of membranes, unspecified as to length of time between rupture and onset of labor, unspecified weeks of gestation: Secondary | ICD-10-CM | POA: Diagnosis present

## 2021-04-29 DIAGNOSIS — D62 Acute posthemorrhagic anemia: Secondary | ICD-10-CM | POA: Diagnosis not present

## 2021-04-29 DIAGNOSIS — Z3A36 36 weeks gestation of pregnancy: Secondary | ICD-10-CM

## 2021-04-29 DIAGNOSIS — O26893 Other specified pregnancy related conditions, third trimester: Secondary | ICD-10-CM | POA: Diagnosis present

## 2021-04-29 DIAGNOSIS — O42013 Preterm premature rupture of membranes, onset of labor within 24 hours of rupture, third trimester: Secondary | ICD-10-CM | POA: Diagnosis not present

## 2021-04-29 DIAGNOSIS — D563 Thalassemia minor: Secondary | ICD-10-CM | POA: Diagnosis present

## 2021-04-29 LAB — TYPE AND SCREEN
ABO/RH(D): O POS
Antibody Screen: NEGATIVE

## 2021-04-29 LAB — CBC
HCT: 27.3 % — ABNORMAL LOW (ref 36.0–46.0)
Hemoglobin: 8.4 g/dL — ABNORMAL LOW (ref 12.0–15.0)
MCH: 26.2 pg (ref 26.0–34.0)
MCHC: 30.8 g/dL (ref 30.0–36.0)
MCV: 85 fL (ref 80.0–100.0)
Platelets: 203 10*3/uL (ref 150–400)
RBC: 3.21 MIL/uL — ABNORMAL LOW (ref 3.87–5.11)
RDW: 16.6 % — ABNORMAL HIGH (ref 11.5–15.5)
WBC: 5.3 10*3/uL (ref 4.0–10.5)
nRBC: 0 % (ref 0.0–0.2)

## 2021-04-29 LAB — RESP PANEL BY RT-PCR (FLU A&B, COVID) ARPGX2
Influenza A by PCR: NEGATIVE
Influenza B by PCR: NEGATIVE
SARS Coronavirus 2 by RT PCR: NEGATIVE

## 2021-04-29 LAB — RPR: RPR Ser Ql: NONREACTIVE

## 2021-04-29 LAB — GROUP B STREP BY PCR: Group B strep by PCR: NEGATIVE

## 2021-04-29 LAB — POCT FERN TEST: POCT Fern Test: POSITIVE

## 2021-04-29 MED ORDER — EPHEDRINE 5 MG/ML INJ: 10.0000 mg | INTRAVENOUS | Status: DC | PRN

## 2021-04-29 MED ORDER — ACETAMINOPHEN 500 MG PO TABS
1000.0000 mg | ORAL_TABLET | Freq: Once | ORAL | Status: AC
  Administered 2021-04-29: 1000 mg via ORAL
  Filled 2021-04-29: qty 2

## 2021-04-29 MED ORDER — ONDANSETRON HCL 4 MG PO TABS
4.0000 mg | ORAL_TABLET | ORAL | Status: DC | PRN
  Administered 2021-04-30: 4 mg via ORAL
  Filled 2021-04-29: qty 1

## 2021-04-29 MED ORDER — SIMETHICONE 80 MG PO CHEW: 80.0000 mg | CHEWABLE_TABLET | ORAL | Status: DC | PRN

## 2021-04-29 MED ORDER — TETANUS-DIPHTH-ACELL PERTUSSIS 5-2.5-18.5 LF-MCG/0.5 IM SUSY: 0.5000 mL | PREFILLED_SYRINGE | Freq: Once | INTRAMUSCULAR | Status: DC

## 2021-04-29 MED ORDER — TERBUTALINE SULFATE 1 MG/ML IJ SOLN: 0.2500 mg | Freq: Once | INTRAMUSCULAR | Status: DC | PRN

## 2021-04-29 MED ORDER — BENZOCAINE-MENTHOL 20-0.5 % EX AERO
1.0000 "application " | INHALATION_SPRAY | CUTANEOUS | Status: DC | PRN
  Administered 2021-04-30: 1 via TOPICAL
  Filled 2021-04-29: qty 56

## 2021-04-29 MED ORDER — SENNOSIDES-DOCUSATE SODIUM 8.6-50 MG PO TABS
2.0000 | ORAL_TABLET | Freq: Every day | ORAL | Status: DC
  Administered 2021-04-30 – 2021-05-01 (×2): 2 via ORAL
  Filled 2021-04-29 (×2): qty 2

## 2021-04-29 MED ORDER — OXYTOCIN-SODIUM CHLORIDE 30-0.9 UT/500ML-% IV SOLN
1.0000 m[IU]/min | INTRAVENOUS | Status: DC
  Administered 2021-04-29: 2 m[IU]/min via INTRAVENOUS

## 2021-04-29 MED ORDER — PRENATAL MULTIVITAMIN CH
1.0000 | ORAL_TABLET | Freq: Every day | ORAL | Status: DC
  Administered 2021-04-30 – 2021-05-01 (×2): 1 via ORAL
  Filled 2021-04-29 (×3): qty 1

## 2021-04-29 MED ORDER — DIBUCAINE (PERIANAL) 1 % EX OINT: 1.0000 "application " | TOPICAL_OINTMENT | CUTANEOUS | Status: DC | PRN

## 2021-04-29 MED ORDER — OXYTOCIN-SODIUM CHLORIDE 30-0.9 UT/500ML-% IV SOLN
2.5000 [IU]/h | INTRAVENOUS | Status: DC
  Filled 2021-04-29: qty 500

## 2021-04-29 MED ORDER — SOD CITRATE-CITRIC ACID 500-334 MG/5ML PO SOLN: 30.0000 mL | ORAL | Status: DC | PRN

## 2021-04-29 MED ORDER — LACTATED RINGERS IV SOLN
500.0000 mL | Freq: Once | INTRAVENOUS | Status: AC
  Administered 2021-04-29: 500 mL via INTRAVENOUS

## 2021-04-29 MED ORDER — FENTANYL CITRATE (PF) 100 MCG/2ML IJ SOLN
100.0000 ug | Freq: Once | INTRAMUSCULAR | Status: AC
  Administered 2021-04-29: 100 ug via INTRAVENOUS
  Filled 2021-04-29: qty 2

## 2021-04-29 MED ORDER — DIPHENHYDRAMINE HCL 50 MG/ML IJ SOLN
12.5000 mg | INTRAMUSCULAR | Status: DC | PRN
Start: 2021-04-29 — End: 2021-04-29

## 2021-04-29 MED ORDER — ONDANSETRON HCL 4 MG/2ML IJ SOLN: 4.0000 mg | INTRAMUSCULAR | Status: DC | PRN

## 2021-04-29 MED ORDER — ACETAMINOPHEN 325 MG PO TABS
650.0000 mg | ORAL_TABLET | ORAL | Status: DC | PRN
  Administered 2021-04-30 – 2021-05-01 (×3): 650 mg via ORAL
  Filled 2021-04-29 (×4): qty 2

## 2021-04-29 MED ORDER — LACTATED RINGERS IV SOLN: INTRAVENOUS | Status: DC

## 2021-04-29 MED ORDER — OXYCODONE-ACETAMINOPHEN 5-325 MG PO TABS: 1.0000 | ORAL_TABLET | ORAL | Status: DC | PRN

## 2021-04-29 MED ORDER — FENTANYL-BUPIVACAINE-NACL 0.5-0.125-0.9 MG/250ML-% EP SOLN
12.0000 mL/h | EPIDURAL | Status: DC | PRN
  Administered 2021-04-29: 12 mL/h via EPIDURAL
  Filled 2021-04-29: qty 250

## 2021-04-29 MED ORDER — ONDANSETRON HCL 4 MG/2ML IJ SOLN
4.0000 mg | Freq: Four times a day (QID) | INTRAMUSCULAR | Status: DC | PRN
  Administered 2021-04-29: 4 mg via INTRAVENOUS
  Filled 2021-04-29: qty 2

## 2021-04-29 MED ORDER — IBUPROFEN 600 MG PO TABS
600.0000 mg | ORAL_TABLET | Freq: Four times a day (QID) | ORAL | Status: DC
  Administered 2021-04-29 – 2021-05-01 (×8): 600 mg via ORAL
  Filled 2021-04-29 (×10): qty 1

## 2021-04-29 MED ORDER — LACTATED RINGERS IV SOLN: 500.0000 mL | INTRAVENOUS | Status: DC | PRN

## 2021-04-29 MED ORDER — PHENYLEPHRINE 40 MCG/ML (10ML) SYRINGE FOR IV PUSH (FOR BLOOD PRESSURE SUPPORT)
80.0000 ug | PREFILLED_SYRINGE | INTRAVENOUS | Status: DC | PRN
  Filled 2021-04-29: qty 10

## 2021-04-29 MED ORDER — COCONUT OIL OIL: 1.0000 "application " | TOPICAL_OIL | Status: DC | PRN

## 2021-04-29 MED ORDER — FLEET ENEMA 7-19 GM/118ML RE ENEM: 1.0000 | ENEMA | RECTAL | Status: DC | PRN

## 2021-04-29 MED ORDER — LIDOCAINE-EPINEPHRINE (PF) 2 %-1:200000 IJ SOLN
INTRAMUSCULAR | Status: DC | PRN
  Administered 2021-04-29: 5 mL via EPIDURAL

## 2021-04-29 MED ORDER — OXYCODONE-ACETAMINOPHEN 5-325 MG PO TABS: 2.0000 | ORAL_TABLET | ORAL | Status: DC | PRN

## 2021-04-29 MED ORDER — TRANEXAMIC ACID-NACL 1000-0.7 MG/100ML-% IV SOLN
1000.0000 mg | Freq: Once | INTRAVENOUS | Status: AC
  Administered 2021-04-29: 1000 mg via INTRAVENOUS
  Filled 2021-04-29: qty 100

## 2021-04-29 MED ORDER — IBUPROFEN 600 MG PO TABS: 600.0000 mg | ORAL_TABLET | Freq: Once | ORAL | Status: DC

## 2021-04-29 MED ORDER — ACETAMINOPHEN 325 MG PO TABS: 650.0000 mg | ORAL_TABLET | ORAL | Status: DC | PRN

## 2021-04-29 MED ORDER — PHENYLEPHRINE 40 MCG/ML (10ML) SYRINGE FOR IV PUSH (FOR BLOOD PRESSURE SUPPORT): 80.0000 ug | PREFILLED_SYRINGE | INTRAVENOUS | Status: DC | PRN

## 2021-04-29 MED ORDER — LIDOCAINE HCL (PF) 1 % IJ SOLN: 30.0000 mL | INTRAMUSCULAR | Status: DC | PRN

## 2021-04-29 MED ORDER — SODIUM CHLORIDE 0.9 % IV SOLN: 5.0000 10*6.[IU] | Freq: Once | INTRAVENOUS | Status: DC

## 2021-04-29 MED ORDER — ZOLPIDEM TARTRATE 5 MG PO TABS: 5.0000 mg | ORAL_TABLET | Freq: Every evening | ORAL | Status: DC | PRN

## 2021-04-29 MED ORDER — DIPHENHYDRAMINE HCL 25 MG PO CAPS: 25.0000 mg | ORAL_CAPSULE | Freq: Four times a day (QID) | ORAL | Status: DC | PRN

## 2021-04-29 MED ORDER — OXYTOCIN BOLUS FROM INFUSION
333.0000 mL | Freq: Once | INTRAVENOUS | Status: AC
  Administered 2021-04-29: 333 mL/h via INTRAVENOUS

## 2021-04-29 MED ORDER — PENICILLIN G POT IN DEXTROSE 60000 UNIT/ML IV SOLN: 3.0000 10*6.[IU] | INTRAVENOUS | Status: DC

## 2021-04-29 MED ORDER — WITCH HAZEL-GLYCERIN EX PADS: 1.0000 "application " | MEDICATED_PAD | CUTANEOUS | Status: DC | PRN

## 2021-04-29 NOTE — MAU Note (Signed)
Received 25 y/o, [id number], 36 3/7 weeks, pt reports leakage of fluid 445 am 04/29/21, clear fluid noted, reports ctx pain 4/10, reports + FM, denies any headache, dizziness, blurry vision or right upper quadrant pain, support person at bedside

## 2021-04-29 NOTE — Discharge Summary (Addendum)
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 yo [id number] [id number] admitted in Latent Labor on 04/29/2021. Patient had an uncomplicated labor course as follows:  Membrane Rupture Time/Date: 4:50 AM ,04/29/2021   Delivery Method:Vaginal, Spontaneous  Episiotomy: None  Lacerations:  None  Patient was started on PO iron postpartum for Hgb 8.4. Otherwise, she an uncomplicated postpartum course. She is ambulating, tolerating a regular diet, passing flatus, and urinating well. No headached, vision changes, RUQ pain, chest pain, SOB, or pedal edema. No weakness or dizziness. Patient is discharged home in stable condition on 05/01/21.  Newborn Data: Birth date:04/29/2021  Birth time:2:26 PM  Gender:Female  Living status:Living  Apgars:8 ,9  Weight:2671 g   Magnesium Sulfate received: No BMZ received: No Rhophylac:N/A MMR:No T-DaP:Given prenatally Flu: No Transfusion:No  Physical exam  Vitals:   04/30/21 0648 04/30/21 1555 04/30/21 2030 05/01/21 0514  BP: 114/74 129/83 124/83 114/78  Pulse: 60 72 63 64  Resp:  18 17   Temp: 97.8 F (36.6 C) 97.7 F (36.5 C) 98.5 F (36.9 C) 98.3 F (36.8 C)  TempSrc: Oral Axillary  Oral Oral  SpO2: 100% 100% 100% 100%   General: alert, cooperative, and no distress Lochia: appropriate Uterine Fundus: firm Incision: N/A DVT Evaluation: No evidence of DVT seen on physical exam. No significant calf/ankle edema. Labs: Lab Results  Component Value Date   WBC 5.3 04/29/2021   HGB 8.4 (L) 04/29/2021   HCT 27.3 (L) 04/29/2021   MCV 85.0 04/29/2021   PLT 203 04/29/2021   CMP Latest Ref Rng & Units 02/06/2021  Glucose 70 - 99 mg/dL 73  BUN 6 - 20 mg/dL 7  Creatinine 0.44 - 1.00 mg/dL 0.51  Sodium 135 - 145 mmol/L 134(L)  Potassium 3.5 - 5.1 mmol/L 3.7  Chloride 98 - 111 mmol/L 104  CO2 22 - 32 mmol/L 22  Calcium 8.9 - 10.3 mg/dL 8.4(L)  Total Protein 6.5 - 8.1 g/dL 6.5  Total Bilirubin 0.3 - 1.2 mg/dL 0.8  Alkaline Phos 38 - 126 U/L 55  AST 15 - 41 U/L 15  ALT 0 - 44 U/L 11   Edinburgh Score: Edinburgh Postnatal Depression Scale Screening Tool 04/30/2021  I have been able to laugh and see the funny side of things. 0  I have looked forward with enjoyment to things. 0  I have blamed myself unnecessarily when things went wrong. 0  I have been anxious or worried for no good reason. 0  I have felt scared or panicky for no good reason. 0  Things have  been getting on top of me. 0  I have been so unhappy that I have had difficulty sleeping. 0  I have felt sad or miserable. 0  I have been so unhappy that I have been crying. 0  The thought of harming myself has occurred to me. 0  Edinburgh Postnatal Depression Scale Total 0     After visit meds:  Allergies as of 05/01/2021   No Known Allergies      Medication List     STOP taking these medications    aspirin 81 MG chewable tablet   cyclobenzaprine 10 MG tablet Commonly known as: FLEXERIL   metroNIDAZOLE 0.75 % vaginal gel Commonly known as: METROGEL VAGINAL   promethazine 25 MG tablet Commonly known as: PHENERGAN   valACYclovir 500 MG tablet Commonly known as: VALTREX       TAKE these  medications    acetaminophen 500 MG tablet Commonly known as: TYLENOL Take 1,000 mg by mouth every 6 (six) hours as needed.   Blood Pressure Kit Devi 1 Device by Does not apply route as needed.   ferrous sulfate 325 (65 FE) MG tablet Take 1 tablet (325 mg total) by mouth every other day.   ibuprofen 200 MG tablet Commonly known as: ADVIL Take 3 tablets (600 mg total) by mouth every 6 (six) hours as needed for mild pain or moderate pain.   PrePLUS 27-1 MG Tabs Take 1 tablet by mouth daily.         Discharge home in stable condition Infant Feeding: Bottle Infant Disposition:home with mother Discharge instruction: per After Visit Summary and Postpartum booklet. Activity: Advance as tolerated. Pelvic rest for 6 weeks.  Diet: routine diet Future Appointments: Future Appointments  Date Time Provider [place]  05/07/2021 10:00 AM [facility] NURSE [facility]  06/09/2021  1:15 PM Patriciaann Clan, DO [facility]   Follow up Visit: PP message sent to [facility] by Maryagnes Amos, CNM  Please schedule this patient for a In person postpartum visit in 6 weeks with the following provider: Any provider. Additional Postpartum F/U:BP check 1 week  High risk pregnancy complicated by:  history of pre-eclampsia Delivery mode:  Vaginal, Spontaneous  Anticipated Birth Control:   Patch , will get at her postpartum visit    05/01/2021 Lattie Corns, MD, PGY-1, Faculty Service   GME ATTESTATION:  I saw and evaluated the patient. I agree with the findings and the plan of care as documented in the resident's note.  Darrelyn Hillock, DO OB Fellow, [place] [facility] [place] 05/01/2021 9:25 AM

## 2021-04-29 NOTE — Anesthesia Procedure Notes (Signed)
Epidural Patient location during procedure: OB Start time: 04/29/2021 12:00 PM End time: 04/29/2021 12:10 PM  Staffing Anesthesiologist: Elmer Picker, MD Performed: anesthesiologist   Preanesthetic Checklist Completed: patient identified, IV checked, risks and benefits discussed, monitors and equipment checked, pre-op evaluation and timeout performed  Epidural Patient position: sitting Prep: DuraPrep and site prepped and draped Patient monitoring: continuous pulse ox, blood pressure, heart rate and cardiac monitor Approach: midline Location: L3-L4 Injection technique: LOR air  Needle:  Needle type: Tuohy  Needle gauge: 17 G Needle length: 9 cm Needle insertion depth: 4 cm Catheter type: closed end flexible Catheter size: 19 Gauge Catheter at skin depth: 9 cm Test dose: negative  Assessment Sensory level: T8 Events: blood not aspirated, injection not painful, no injection resistance, no paresthesia and negative IV test  Additional Notes Patient identified. Risks/Benefits/Options discussed with patient including but not limited to bleeding, infection, nerve damage, paralysis, failed block, incomplete pain control, headache, blood pressure changes, nausea, vomiting, reactions to medication both or allergic, itching and postpartum back pain. Confirmed with bedside nurse the patient's most recent platelet count. Confirmed with patient that they are not currently taking any anticoagulation, have any bleeding history or any family history of bleeding disorders. Patient expressed understanding and wished to proceed. All questions were answered. Sterile technique was used throughout the entire procedure. Please see nursing notes for vital signs. Test dose was given through epidural catheter and negative prior to continuing to dose epidural or start infusion. Warning signs of high block given to the patient including shortness of breath, tingling/numbness in hands, complete motor  block, or any concerning symptoms with instructions to call for help. Patient was given instructions on fall risk and not to get out of bed. All questions and concerns addressed with instructions to call with any issues or inadequate analgesia.  Reason for block:procedure for pain

## 2021-04-29 NOTE — H&P (Addendum)
OBSTETRIC ADMISSION HISTORY AND PHYSICAL  Kristin Bruce is a 25 y.o. female [phone] with IUP at 22w3dby 6wk UKoreapresenting for contractions and leakage of fluid. She reports +FMs, no VB, no blurry vision, headaches or peripheral edema, and RUQ pain.  She plans on bottle feeding. She request patch for birth control. [facility]  Dating: By 6wk [place]--->  Estimated Date of Delivery: 05/24/21  Sono:    _0 , CWD, normal anatomy, transverse presentation, 289g, 56% EFW   Prenatal History/Complications: anemia, Hx severe preE.  Past Medical History: Past Medical History:  Diagnosis Date   Anemia    Depression    HSV (herpes simplex virus) anogenital infection    Suicidal thoughts    during pregnancy   Wears glasses     Past Surgical History: Past Surgical History:  Procedure Laterality Date   INCISION AND DRAINAGE ABSCESS N/A 05/13/2018   Procedure: INCISION AND DRAINAGE, PILONIDAL  ABSCESS;  Surgeon: WJudeth Horn MD;  [facility]  Service: [facility];  Laterality: N/A;   INCISION AND DRAINAGE PERIRECTAL ABSCESS N/A 04/01/2018   Procedure: IRRIGATION AND DEBRIDEMENT PERIRECTAL ABSCESS;  Surgeon: TGeorganna Skeans MD;  [place]  Service: General;  Laterality: N/A;   NO PAST SURGERIES     PILONIDAL CYST EXCISION N/A 07/20/2018   Procedure: EXCISION OF PILONIDAL DISEASE;  Surgeon: WIleana Roup MD;  [place]  Service: General;  Laterality: N/A;    Obstetrical History: OB History     Gravida  4   Para  2   Term  1   Preterm  1   AB  1   Living  2      SAB  1   IAB  0   Ectopic  0   Multiple  0   Live Births  2           Social History Social History   Socioeconomic History   Marital status: Single    Spouse name: Not on file   Number of children: 2   Years of education: Not on file   Highest education level: Not on file  Occupational History   Not on file  Tobacco Use    Smoking status: Never   Smokeless tobacco: Never  Vaping Use   Vaping Use: Never used  Substance and Sexual Activity   Alcohol use: Not Currently    Comment: not since pregnancy   Drug use: Not Currently    Types: Marijuana    Comment: last use was about 10 weeks ago   Sexual activity: Yes    Birth control/protection: Patch  Other Topics Concern   Not on file  Social History Narrative   Not on file   Social Determinants of Health   Financial Resource Strain: Not on file  Food Insecurity: No Food Insecurity   Worried About RCharity fundraiserin the Last Year: Never true   Ran Out of Food in the Last Year: Never true  Transportation Needs: Unmet Transportation Needs   Lack of Transportation (Medical): Yes   Lack of Transportation (Non-Medical): Yes  Physical Activity: Not on file  Stress: Not on file  Social Connections: Not on file    Family History: Family History  Problem Relation Age of Onset   Diabetes Mother    Hypertension Mother    Heart failure Mother    Diabetes Maternal Grandfather     Allergies: No Known  Allergies  Medications Prior to Admission  Medication Sig Dispense Refill Last Dose   aspirin 81 MG chewable tablet Chew 1 tablet (81 mg total) by mouth daily.   04/24/2021 at 0800   Prenatal Vit-Fe Fumarate-FA (PREPLUS) 27-1 MG TABS Take 1 tablet by mouth daily. 90 tablet 2 04/28/2021   valACYclovir (VALTREX) 500 MG tablet Take 1 tablet (500 mg total) by mouth 2 (two) times daily. 60 tablet 2 04/28/2021   acetaminophen (TYLENOL) 500 MG tablet Take 1,000 mg by mouth every 6 (six) hours as needed.   Unknown   Blood Pressure Monitoring (BLOOD PRESSURE KIT) DEVI 1 Device by Does not apply route as needed. 1 each 0    cyclobenzaprine (FLEXERIL) 10 MG tablet Take 1 tablet (10 mg total) by mouth 3 (three) times daily as needed. 30 tablet 0 Unknown   ferrous sulfate 325 (65 FE) MG tablet Take 1 tablet (325 mg total) by mouth every other day. 30 tablet 3 Unknown    metroNIDAZOLE (METROGEL VAGINAL) 0.75 % vaginal gel Place 1 Applicatorful vaginally 2 (two) times daily. (Patient not taking: No sig reported) 70 g 0    promethazine (PHENERGAN) 25 MG tablet Take 1 tablet (25 mg total) by mouth every 6 (six) hours as needed for nausea or vomiting. 30 tablet 0 Unknown     Review of Systems   All systems reviewed and negative except as stated in HPI  Blood pressure 114/72, pulse 84, temperature 98.4 F (36.9 C), temperature source Oral, resp. rate 18, last menstrual period 06/25/2020, SpO2 99 %. General appearance: alert, cooperative, and no distress. Somewhat sleepy having just received fentanyl Lungs: normal work of breathing Heart: regular rate and rhythm Abdomen: gravid Pelvic: see cervical exam below Extremities: 1+, symmetric, nonpitting pedal edema. No calf tenderness Presentation: cephalic Fetal monitoringBaseline: 145 bpm, Variability: Good {> 6 bpm), Accelerations: Reactive, and Decelerations: intermittent early Uterine activityFrequency: Every 5-8 minutes Dilation: 1.5 Effacement (%): 60 Station: -2   Prenatal labs: ABO, Rh: --/--/PENDING (10/12 0600) Antibody: PENDING (10/12 0600) Rubella: 3.21 (07/22 2009) RPR: Non Reactive (08/24 0841)  HBsAg: NON REACTIVE (07/22 2009)  HIV: Non Reactive (08/24 0841)  GBS: NEGATIVE/-- (10/12 0621)  2 hr Glucola normal Genetic screening: NIPS low risk, AFP neg, Horizon alpha thal carrier Anatomy US normal  Prenatal Transfer Tool  Maternal Diabetes: No Genetic Screening: NIPS low risk, AFP neg, Horizon alpha thal carrier Anatomy US normal Maternal Ultrasounds/Referrals: moderate-large subchorionic hemorrhage, resolved. Otherwise Normal Fetal Ultrasounds or other Referrals:  None Maternal Substance Abuse:  No Significant Maternal Medications:  None Significant Maternal Lab Results: Group B Strep negative  Results for orders placed or performed during the hospital encounter of 04/29/21 (from  the past 24 hour(s))  Resp Panel by RT-PCR (Flu A&B, Covid) Nasopharyngeal Swab   Collection Time: 04/29/21  5:54 AM   Specimen: Nasopharyngeal Swab; Nasopharyngeal(NP) swabs in vial transport medium  Result Value Ref Range   SARS Coronavirus 2 by RT PCR NEGATIVE NEGATIVE   Influenza A by PCR NEGATIVE NEGATIVE   Influenza B by PCR NEGATIVE NEGATIVE  Type and screen [place]   Collection Time: 04/29/21  6:00 AM  Result Value Ref Range   ABO/RH(D) PENDING    Antibody Screen PENDING    Sample Expiration      05/02/2021,2359 Performed at [facility], [place] [address], [place], [place] [address]   POCT fern test   Collection Time: 04/29/21  6:17 AM  Result Value Ref  Range   POCT Fern Test Positive = ruptured amniotic membanes   Group B strep by PCR   Collection Time: 04/29/21  6:21 AM   Specimen: Vaginal/Rectal; Genital  Result Value Ref Range   Group B strep by PCR NEGATIVE NEGATIVE  CBC   Collection Time: 04/29/21  8:10 AM  Result Value Ref Range   WBC 5.3 4.0 - 10.5 K/uL   RBC 3.21 (L) 3.87 - 5.11 MIL/uL   Hemoglobin 8.4 (L) 12.0 - 15.0 g/dL   HCT 27.3 (L) 36.0 - 46.0 %   MCV 85.0 80.0 - 100.0 fL   MCH 26.2 26.0 - 34.0 pg   MCHC 30.8 30.0 - 36.0 g/dL   RDW 16.6 (H) 11.5 - 15.5 %   Platelets 203 150 - 400 K/uL   nRBC 0.0 0.0 - 0.2 %    Patient Active Problem List   Diagnosis Date Noted   Premature rupture of membranes 04/29/2021   Alpha thalassemia silent carrier 12/09/2020   Supervision of high risk pregnancy, antepartum 10/30/2020   Migraine 10/30/2020   History of pre-eclampsia 09/30/2020   Herpes simplex type 1 infection 06/09/2017   Anemia of pregnancy 01/07/2016   Adjustment disorder with depressed mood 11/22/2015    Assessment/Plan:  Kristin Bruce is a 25 y.o. [id number] at 3w3dhere for SROM and early labor  #Labor: PPROM in early labor. Will manage expectantly. Consider pit if no change at next check. #Hx severe preE in prior  pregnancy. BP mildly elevated on arrival. Currently well controlled on no medicine. Continue to monitor #Anemia: HGB 8.8 on admission. #Pain: Receiving fentanyl now. Interested in epidural #FWB: Cat 1 #ID:  GBS neg. Valtrex prophylaxis for Hx HSV. No active lesions. Will perform sterile speculum exam once patient more awake. #MOF: bottle #MOC: patch #Circ:  N/A  AMayer Masker PGY-1, Faculty Service 04/29/2021, 8:39 AM     Attestation of CNM Supervision of Resident: Evaluation and management procedures were performed by the [facility] under my supervision. I was immediately available for direct supervision, assistance and direction throughout this encounter.  I also confirm that I have verified the information documented in the resident's note, and that I have also personally reperformed the pertinent components of the physical exam and all of the medical decision making activities.  I have also made any necessary editorial changes.  SSE performed given history of HSV. No lesions present.  Cervix 2/50/-2, essentially unchanged from previous exam. Will start Pitocin 2/2 titration per unit policy.    DRenee Harder CNM 04/29/2021 10:30 AM

## 2021-04-29 NOTE — Anesthesia Preprocedure Evaluation (Signed)
Anesthesia Evaluation  Patient identified by MRN, date of birth, ID band Patient awake    Reviewed: Allergy & Precautions, NPO status , Patient's Chart, lab work & pertinent test results  Airway Mallampati: II  TM Distance: >3 FB Neck ROM: Full    Dental no notable dental hx.    Pulmonary neg pulmonary ROS,    Pulmonary exam normal breath sounds clear to auscultation       Cardiovascular negative cardio ROS Normal cardiovascular exam Rhythm:Regular Rate:Normal     Neuro/Psych  Headaches, PSYCHIATRIC DISORDERS Depression    GI/Hepatic negative GI ROS, Neg liver ROS,   Endo/Other  negative endocrine ROS  Renal/GU negative Renal ROS  negative genitourinary   Musculoskeletal negative musculoskeletal ROS (+)   Abdominal   Peds  Hematology  (+) Blood dyscrasia, anemia , Lab Results      Component                Value               Date                      WBC                      5.3                 04/29/2021                HGB                      8.4 (L)             04/29/2021                HCT                      27.3 (L)            04/29/2021                MCV                      85.0                04/29/2021                PLT                      203                 04/29/2021              Anesthesia Other Findings Presents with PPROM  Reproductive/Obstetrics (+) Pregnancy                             Anesthesia Physical Anesthesia Plan  ASA: 3  Anesthesia Plan: Epidural   Post-op Pain Management:    Induction:   PONV Risk Score and Plan: Treatment may vary due to age or medical condition  Airway Management Planned: Natural Airway  Additional Equipment:   Intra-op Plan:   Post-operative Plan:   Informed Consent: I have reviewed the patients History and Physical, chart, labs and discussed the procedure including the risks, benefits and alternatives for the proposed  anesthesia with the patient or authorized representative who has indicated his/her understanding and acceptance.  Plan Discussed with: Anesthesiologist  Anesthesia Plan Comments: (Patient identified. Risks, benefits, options discussed with patient including but not limited to bleeding, infection, nerve damage, paralysis, failed block, incomplete pain control, headache, blood pressure changes, nausea, vomiting, reactions to medication, itching, and post partum back pain. Confirmed with bedside nurse the patient's most recent platelet count. Confirmed with the patient that they are not taking any anticoagulation, have any bleeding history or any family history of bleeding disorders. Patient expressed understanding and wishes to proceed. All questions were answered. )        Anesthesia Quick Evaluation

## 2021-04-30 MED ORDER — FERROUS SULFATE 325 (65 FE) MG PO TABS: 325.0000 mg | ORAL_TABLET | ORAL | Status: DC

## 2021-04-30 NOTE — Social Work (Addendum)
CSW received consult for Suicidal Thoughts. CSW met with MOB to offer support and complete assessment.    Per chart review, it is noted that MOB had SI in 2017, not this current pregnancy. CSW met with MOB because she has history of depression/PPD.   CSW met with MOB at bedside and introduced CSW role. CSW congratulated MOB. CSW observed MOB holding the infant and her sister at bedside providing support. MOB presented calm and welcomed CSW to complete the assessment, with her sister present. CSW inquired how MOB has felt since giving birth. MOB expressed feeling good with a great L&D experience due to her supports. CSW inquired how MOB felt during her pregnancy. MOB disclosed, " I went through it." MOB explained that she was confrontational with her significant other and angry at times. MOB believes it was due to her hormones because she does not usually interact that way with her significant other. CSW inquired about MOB history of depression. MOB reported she was diagnosed with depression after the birth of her son, in 2017. MOB shared during the pregnancy she was hospitalized for SI and self-harm. MOB reported she was stressed due to a breakup with her previous partner and not having support. MOB shared she experienced PPD in 2017 but not with her other pregnancies. MOB stated, " I cried all the time, he cried, we cried together." MOB shared she received therapy treatment for a week after discharge from the [facility] hospital but no treatment since then. MOB shared she has not had concerns other than normal emotions. CSW inquired about MOB coping skills. MOB reported she engages in pampering herself to feel good, she walks with her sister and listens to music. CSW encouraged MOB to continue to use the coping skills and provided education regarding the baby blues period vs. perinatal mood disorders. CSW discussed treatment and gave resources for mental health follow up if concerns arise.  CSW recommended MOB complete  a self-evaluation during the postpartum time period using the New Mom Checklist from Postpartum Progress and encouraged MOB to contact a medical professional if symptoms are noted at any time. MOB shared she feel comfortable reaching out her provider if she has concerns. CSW assessed MOB for safety. MOB denied thoughts of harm to self and others. MOB denied domestic violence. CSW inquired about MOB supports. MOB identified her sister and FOB as supports.   CSW provided review of Sudden Infant Death Syndrome (SIDS) precautions. MOB reported she has essential items for the infant including a car seat and a bassinet where the infant will sleep. MOB has chosen [facility] for infant's follow up care. MOB reports she will have transportation to appointments. MOB reported she receives both WIC/FS benefits. CSW assessed MOB for additional needs. MOB reported no further needs.   CSW identifies no further need for intervention and no barriers to discharge at this time.   Kathrin Greathouse, MSW, LCSW Women's and [place] Worker  [phone] 04/30/2021  2:19 PM

## 2021-04-30 NOTE — Progress Notes (Signed)
Post Partum Day #1 Subjective: no complaints, up ad lib, and tolerating PO; bottlefeeding; plans on patch for contraception; no s/s anemia  Objective: Blood pressure 114/74, pulse 60, temperature 97.8 F (36.6 C), temperature source Oral, resp. rate 16, last menstrual period 06/25/2020, SpO2 100 %, unknown if currently breastfeeding.  Physical Exam:  General: alert, cooperative, and no distress Lochia: appropriate Uterine Fundus: firm DVT Evaluation: No evidence of DVT seen on physical exam.  Recent Labs    04/29/21 0810  HGB 8.4*  HCT 27.3*    Assessment/Plan: Plan for discharge tomorrow Restart home Fe qod   LOS: 1 day   Arabella Merles CNM 04/30/2021, 9:23 AM

## 2021-04-30 NOTE — Anesthesia Postprocedure Evaluation (Signed)
Anesthesia Post Note  Patient: Kristin Bruce  Procedure(s) Performed: AN AD HOC LABOR EPIDURAL     Patient location during evaluation: Mother Baby Anesthesia Type: Epidural Level of consciousness: awake and alert Pain management: pain level controlled Vital Signs Assessment: post-procedure vital signs reviewed and stable Respiratory status: spontaneous breathing, nonlabored ventilation and respiratory function stable Cardiovascular status: stable Postop Assessment: no headache, no backache and epidural receding Anesthetic complications: no   No notable events documented.  Last Vitals:  Vitals:   04/30/21 0215 04/30/21 0648  BP: (!) 131/94 114/74  Pulse: 63 60  Resp: 16   Temp: 36.8 C 36.6 C  SpO2: 100% 100%    Last Pain:  Vitals:   04/30/21 0648  TempSrc: Oral  PainSc: 0-No pain   Pain Goal: Patients Stated Pain Goal: 1 (04/29/21 0603)                 Fanny Dance

## 2021-05-01 LAB — CULTURE, BETA STREP (GROUP B ONLY): Strep Gp B Culture: NEGATIVE

## 2021-05-01 MED ORDER — IBUPROFEN 200 MG PO TABS: 600.0000 mg | ORAL_TABLET | Freq: Four times a day (QID) | ORAL | Status: DC | PRN

## 2021-05-01 NOTE — Social Work (Signed)
CSW received consult for "patient has asked for clothes for discharge, needing a transportation waiver."  CSW met with MOB to offer support and complete assessment.    CSW met with MOB at bedside to offer support. MOB informed CSW that FOB will bring her clothes to the hospital at discharge. CSW inquired about transportation needs. MOB shared FOB will transport her and infant home today. MOB shared she uses [employer] for appointments. CSW assessed MOB for additional needs. MOB reported no further needs.   Kathrin Greathouse, MSW, LCSW Women's and [place] Worker  [phone] 05/01/2021  10:01 AM

## 2021-05-04 ENCOUNTER — Encounter: Payer: Self-pay | Admitting: Student

## 2021-05-07 ENCOUNTER — Ambulatory Visit: Payer: Self-pay

## 2021-05-08 ENCOUNTER — Telehealth: Payer: Self-pay

## 2021-05-08 NOTE — Telephone Encounter (Signed)
Called pt regarding missed BP check appt yesterday; VM left stating pt may call to reschedule or send MyChart message.

## 2021-05-11 ENCOUNTER — Encounter: Payer: Self-pay | Admitting: Obstetrics and Gynecology

## 2021-05-11 ENCOUNTER — Telehealth (HOSPITAL_COMMUNITY): Payer: Self-pay | Admitting: *Deleted

## 2021-05-11 NOTE — Telephone Encounter (Signed)
Attempted hospital discharge follow-up call. Left message for patient to return RN call. Deforest Hoyles, RN, 05/11/21, [phone]

## 2021-05-14 ENCOUNTER — Other Ambulatory Visit: Payer: Self-pay

## 2021-05-14 DIAGNOSIS — Z5941 Food insecurity: Secondary | ICD-10-CM

## 2021-05-24 ENCOUNTER — Inpatient Hospital Stay (HOSPITAL_COMMUNITY): Admit: 2021-05-24 | Payer: Self-pay

## 2021-06-09 ENCOUNTER — Ambulatory Visit: Payer: Self-pay | Admitting: Family Medicine

## 2021-07-19 NOTE — L&D Delivery Note (Signed)
Delivery Note Kristin Bruce is a 26 y.o. [id number] at [redacted]w[redacted]d admitted for elective IOL.   GBS Status:  Negative/-- (11/02 1117) Maximum Maternal Temperature: 98.2  Labor course: Initial SVE: 3/70/-3. Augmentation with: Pitocin. Called to room for prolonged deceleration. Patient was checked and found to be complete. She spontaneously ruptured membranes just prior to delivery. Dr. Macon Large at bedside.  ROM: rupture date, rupture time, delivery date, or delivery time have not been documented with clear fluid  Birth: At [phone] a viable female was delivered via spontaneous vaginal delivery (Presentation: cephalic;LOA). Nuchal cord present: Yes, delivered and somersaulted through. Shoulders and body delivered in usual fashion. Infant placed directly on mom's abdomen for bonding/skin-to-skin, baby dried and stimulated. Cord clamped x 2 after 30 seconds and cut by CNM. Cord blood collected. The placenta separated spontaneously and delivered via gentle cord traction. Pitocin infused rapidly IV per protocol. Fundus firm with massage.  Placenta inspected and appears to be intact with a 3 VC. Placenta/Cord with the following complications: none. Arterial cord pH: 7.2, CO2: 61, Bicarb: 23.8 Sponge and instrument count were correct x2.  Intrapartum complications:  None Anesthesia:  epidural Episiotomy: none Lacerations:  none Suture Repair:  n/a EBL (mL): 111   Infant: APGAR (1 MIN):  8 APGAR (5 MINS):  9 Infant weight: pending  Mom to postpartum.  Baby to Couplet care / Skin to Skin. Placenta to L&D   Plans to Bottlefeed Contraception: Ortho-Evra patches weekly Circumcision: wants inpatient  Note sent to [facility]: MCW for pp visit.   Brand Males CNM 06/06/2022 9:37 AM

## 2021-09-29 ENCOUNTER — Emergency Department (HOSPITAL_COMMUNITY)
Admission: EM | Admit: 2021-09-29 | Discharge: 2021-09-30 | Disposition: A | Discharge status: Home / Self Care | Payer: Medicaid Other | Attending: Emergency Medicine | Admitting: Emergency Medicine

## 2021-09-29 ENCOUNTER — Encounter (HOSPITAL_COMMUNITY): Payer: Self-pay

## 2021-09-29 ENCOUNTER — Other Ambulatory Visit: Payer: Self-pay

## 2021-09-29 ENCOUNTER — Emergency Department (HOSPITAL_COMMUNITY): Payer: Medicaid Other

## 2021-09-29 DIAGNOSIS — R079 Chest pain, unspecified: Secondary | ICD-10-CM | POA: Diagnosis present

## 2021-09-29 DIAGNOSIS — N9489 Other specified conditions associated with female genital organs and menstrual cycle: Secondary | ICD-10-CM | POA: Diagnosis not present

## 2021-09-29 DIAGNOSIS — R0789 Other chest pain: Secondary | ICD-10-CM | POA: Diagnosis not present

## 2021-09-29 NOTE — ED Provider Triage Note (Signed)
Emergency Medicine Provider Triage Evaluation Note ? ?Kristin Bruce , a 26 y.o. female  was evaluated in triage.  Pt complains of gradual onset, constant, stabbing, substernal chest pain that began 4 days ago.  Patient also complains of nausea and fatigue.  No shortness of breath.  She reports she has never had issues with this in the past.  She denies any recent sick contacts.  No cough.  No history of DVT or PE.  No recent prolonged travel or immobilization.  No hemoptysis.  No active malignancy.  No exogenous hormone use. ? ?Review of Systems  ?Positive: + chest pain, nausea ?Negative: - SOB, vomiting, diaphoresis, leg swelling ? ?Physical Exam  ?BP (!) 131/94 (BP Location: Right Arm)   Pulse 86   Temp 98.4 ?F (36.9 ?C) (Oral)   Resp 20   Ht 5' (1.524 m)   Wt 64.4 kg   SpO2 99%   BMI 27.73 kg/m?  ?Gen:   Awake, no distress   ?Resp:  Normal effort  ?MSK:   Moves extremities without difficulty  ?Other:   ? ?Medical Decision Making  ?Medically screening exam initiated at 11:51 PM.  Appropriate orders placed.  Kristin Bruce was informed that the remainder of the evaluation will be completed by another provider, this initial triage assessment does not replace that evaluation, and the importance of remaining in the ED until their evaluation is complete. ? ? ?  ?Tanda Rockers, PA-C ?09/29/21 2352 ? ?

## 2021-09-29 NOTE — ED Triage Notes (Signed)
Sternal chest pain for several days.  ?

## 2021-09-30 LAB — BASIC METABOLIC PANEL
Anion gap: 9 (ref 5–15)
BUN: 17 mg/dL (ref 6–20)
CO2: 23 mmol/L (ref 22–32)
Calcium: 9.5 mg/dL (ref 8.9–10.3)
Chloride: 104 mmol/L (ref 98–111)
Creatinine, Ser: 0.8 mg/dL (ref 0.44–1.00)
GFR, Estimated: >60 mL/min (ref >60)
Glucose, Bld: 88 mg/dL (ref 70–99)
Potassium: 3.8 mmol/L (ref 3.5–5.1)
Sodium: 136 mmol/L (ref 135–145)

## 2021-09-30 LAB — CBC
HCT: 41.3 % (ref 36.0–46.0)
Hemoglobin: 12.9 g/dL (ref 12.0–15.0)
MCH: 27 pg (ref 26.0–34.0)
MCHC: 31.2 g/dL (ref 30.0–36.0)
MCV: 86.6 fL (ref 80.0–100.0)
Platelets: 346 10*3/uL (ref 150–400)
RBC: 4.77 MIL/uL (ref 3.87–5.11)
RDW: 14 % (ref 11.5–15.5)
WBC: 6.2 10*3/uL (ref 4.0–10.5)
nRBC: 0 % (ref 0.0–0.2)

## 2021-09-30 LAB — CBG MONITORING, ED: Glucose-Capillary: 91 mg/dL (ref 70–99)

## 2021-09-30 LAB — TROPONIN I (HIGH SENSITIVITY): Troponin I (High Sensitivity): <2 ng/L (ref <18)

## 2021-09-30 LAB — I-STAT BETA HCG BLOOD, ED (MC, WL, AP ONLY): I-stat hCG, quantitative: <5 m[IU]/mL (ref <5)

## 2021-09-30 MED ORDER — HYDROXYZINE HCL 25 MG PO TABS
25.0000 mg | ORAL_TABLET | Freq: Once | ORAL | Status: AC
  Administered 2021-09-30: 25 mg via ORAL
  Filled 2021-09-30: qty 1

## 2021-09-30 MED ORDER — IBUPROFEN 200 MG PO TABS
600.0000 mg | ORAL_TABLET | Freq: Once | ORAL | Status: AC
  Administered 2021-09-30: 600 mg via ORAL
  Filled 2021-09-30: qty 3

## 2021-09-30 MED ORDER — HYDROXYZINE HCL 25 MG PO TABS: 25.0000 mg | ORAL_TABLET | Freq: Four times a day (QID) | ORAL | 0 refills | Status: DC

## 2021-09-30 NOTE — ED Provider Notes (Addendum)
?[facility] DEPT ?Provider Note ? ? ?CSN: [id number] ?Arrival date & time: 09/29/21  2319 ? ?  ? ?History ? ?Chief Complaint  ?Patient presents with  ? Chest Pain  ? ? ?Kristin Bruce is a 26 y.o. female. ? ? ?Chest Pain ? ?Patient with chest pain ongoing intermittently for the past 5 days but became constant when she woke up this morning.  Came to the ER for evaluation of this.  Denies any shortness of breath nausea she denies any exertional worsening but states that it is worse when she moves her arms around in certain fashion she is not having pleuritic pain no hemoptysis  ? ? ?She denies any smoking of tobacco but does smoke marijuana regularly. ? ?No recent surgeries, hospitalization, long travel, hemoptysis, estrogen containing OCP, cancer history.  No unilateral leg swelling.  No history of PE or VTE.  ? ?Currently not on any contraceptive but states that she plans to restart soon. ? ?No history of DM2, HTN, HLD stroke or heart disease ? ?  ? ?Home Medications ?Prior to Admission medications   ?Medication Sig Start Date End Date Taking? Authorizing Provider  ?acetaminophen (TYLENOL) 500 MG tablet Take 1,000 mg by mouth every 6 (six) hours as needed.    [provider]  ?Blood Pressure Monitoring (BLOOD PRESSURE KIT) DEVI 1 Device by Does not apply route as needed. 10/30/20   Caren Macadam, MD  ?ferrous sulfate 325 (65 FE) MG tablet Take 1 tablet (325 mg total) by mouth every other day. 02/06/21 02/06/22  Nugent, Gerrie Nordmann, NP  ?ibuprofen (ADVIL) 200 MG tablet Take 3 tablets (600 mg total) by mouth every 6 (six) hours as needed for mild pain or moderate pain. 05/01/21   Patriciaann Clan, DO  ?Prenatal Vit-Fe Fumarate-FA (PREPLUS) 27-1 MG TABS Take 1 tablet by mouth daily. 03/03/21   Renard Matter, MD  ?   ? ?Allergies    ?Patient has no known allergies.   ? ?Review of Systems   ?Review of Systems  ?Cardiovascular:  Positive for chest pain.  ? ?Physical Exam ?Updated  Vital Signs ?BP (!) 131/94 (BP Location: Right Arm)   Pulse 86   Temp 98.4 ?F (36.9 ?C) (Oral)   Resp 20   Ht 5' (1.524 m)   Wt 64.4 kg   SpO2 99%   BMI 27.73 kg/m?  ?Physical Exam ?Vitals and nursing note reviewed.  ?Constitutional:   ?   General: She is not in acute distress. ?   Comments: Pleasant well-appearing 26 year old.  In no acute distress.  Sitting comfortably in bed.  Able answer questions appropriately follow commands. No increased work of breathing. Speaking in full sentences.   ?HENT:  ?   Head: Normocephalic and atraumatic.  ?   Nose: Nose normal.  ?   Mouth/Throat:  ?   Mouth: Mucous membranes are moist.  ?Eyes:  ?   General: No scleral icterus. ?Cardiovascular:  ?   Rate and Rhythm: Normal rate and regular rhythm.  ?   Pulses: Normal pulses.  ?   Heart sounds: Normal heart sounds.  ?Pulmonary:  ?   Effort: Pulmonary effort is normal. No respiratory distress.  ?   Breath sounds: No wheezing.  ?Abdominal:  ?   Palpations: Abdomen is soft.  ?   Tenderness: There is no abdominal tenderness.  ?Musculoskeletal:  ?   Cervical back: Normal range of motion.  ?   Right lower leg: No edema.  ?  Left lower leg: No edema.  ?   Comments: No lower extremity edema or calf tenderness.  No bruising  ?Skin: ?   General: Skin is warm and dry.  ?   Capillary Refill: Capillary refill takes less than 2 seconds.  ?Neurological:  ?   Mental Status: She is alert. Mental status is at baseline.  ?Psychiatric:     ?   Mood and Affect: Mood normal.     ?   Behavior: Behavior normal.  ? ? ?ED Results / Procedures / Treatments   ?Labs ?(all labs ordered are listed, but only abnormal results are displayed) ?Labs Reviewed  ?BASIC METABOLIC PANEL  ?CBC  ?I-STAT BETA HCG BLOOD, ED ([facility], [facility], [facility] ONLY)  ?CBG MONITORING, ED  ?TROPONIN I (HIGH SENSITIVITY)  ? ? ?EKG ?None ? ?Radiology ?DG Chest 2 View ? ?Result Date: 09/30/2021 ?CLINICAL DATA:  Substernal chest pain beginning 4 days ago. Nausea and fatigue. EXAM: CHEST - 2 VIEW  COMPARISON:  None. FINDINGS: The heart size and mediastinal contours are within normal limits. Both lungs are clear. The visualized skeletal structures are unremarkable. Prominent nipple shadows bilaterally. IMPRESSION: No active cardiopulmonary disease. Electronically Signed   By: Lucienne Capers M.D.   On: 09/30/2021 00:02   ? ?Procedures ?Procedures  ? ? ?Medications Ordered in ED ?Medications  ?ibuprofen (ADVIL) tablet 600 mg (has no administration in time range)  ?hydrOXYzine (ATARAX) tablet 25 mg (has no administration in time range)  ? ? ?ED Course/ Medical Decision Making/ A&P ?Clinical Course as of 09/30/21 0231  ?Wed Sep 30, 2021  ?0218 Pain intermittent since 5 days ago and constant since when she woke up this AM.  [WF]  ?  ?Clinical Course User Index ?[WF] Tedd Sias, [place]  ? ?                        ?Medical Decision Making ?Amount and/or Complexity of Data Reviewed ?Labs: ordered. ?Radiology: ordered. ? ?Risk ?OTC drugs. ?Prescription drug management. ? ? ?This patient presents to the ED for concern of chest pain, this involves a number of treatment options, and is a complaint that carries with it a high risk of complications and morbidity.  The differential diagnosis includes The emergent causes of chest pain include: Acute coronary syndrome, tamponade, pericarditis/myocarditis, aortic dissection, pulmonary embolism, tension pneumothorax, pneumonia, and esophageal rupture. ? ? ? ?Co morbidities: ?Discussed in HPI ? ? ?Brief History: ? ?Patient with chest pain ongoing intermittently for the past 5 days but became constant when she woke up this morning.  Came to the ER for evaluation of this.  Denies any shortness of breath nausea she denies any exertional worsening but states that it is worse when she moves her arms around in certain fashion she is not having pleuritic pain no hemoptysis she has no risk factors for PE and is PERC negative.  I have low suspicion for PE as she is well-appearing on  my examination. ? ?Physical exam without lower extremity edema or calf tenderness lungs are clear chest wall tender ? ?Work-up reassuring ? ? ?EMR reviewed including pt PMHx, past surgical history and past visits to ER.  ? ?See HPI for more details ? ? ?Lab Tests: ? ? ?I personally reviewed all laboratory work and imaging. Metabolic panel without any acute abnormality specifically kidney function within normal limits and no significant electrolyte abnormalities. CBC without leukocytosis or significant anemia.  Troponin x1 within normal limits/undetectable after greater  than 6 hours of constant chest pain ? ? ?Imaging Studies: ? ?NAD. I personally reviewed all imaging studies and no acute abnormality found. I agree with radiology interpretation. ? ?Chest x-ray unremarkable ? ?Cardiac Monitoring: ? ?The patient was maintained on a cardiac monitor.  I personally viewed and interpreted the cardiac monitored which showed an underlying rhythm of: NSR ?EKG non-ischemic EKG without evidence of ischemia ? ? ?Medicines ordered: ? ?I ordered medication including hydroxyzine and ibuprofen for difficulty sleeping and sternal pain ?Reevaluation of the patient after these medicines showed that the patient improved ?I have reviewed the patients home medicines and have made adjustments as needed ? ? ?Critical Interventions: ? ? ? ? ?Consults/Attending Physician ? ? ? ? ? ?Reevaluation: ? ?After the interventions noted above I re-evaluated patient and found that they have :improved ? ? ?Social Determinants of Health: ? ?Social work/case management involved -they would not directly involved however transition of care consult was placed for follow-up over the phone.  Patient's phone number is verified ? ? ? ?Problem List / ED Course: ? ?Chest pain  ?In this particular patient I do not believe the patient has an emergent cause of chest pain, other urgent/non-acute considerations include, but are not limited to: chronic angina, aortic  stenosis, cardiomyopathy, mitral valve prolapse, pulmonary hypertension, aortic insufficiency, right ventricular hypertrophy, pleuritis, bronchitis, pneumothorax, tumor, gastroesophageal reflux disease (GERD), esophag

## 2021-09-30 NOTE — Discharge Instructions (Addendum)
Please follow-up with the [facility].  I have reached out to the transition of care team/social workers who should be reaching out to you about resources.  Drink water and take Tylenol and ibuprofen as discussed below.  I also printed prescription for hydroxyzine which she may use at bedtime for sleep ? ?Please use Tylenol or ibuprofen for pain.  You may use 600 mg ibuprofen every 6 hours or 1000 mg of Tylenol every 6 hours.  You may choose to alternate between the 2.  This would be most effective.  Not to exceed 4 g of Tylenol within 24 hours.  Not to exceed 3200 mg ibuprofen 24 hours.  ? ? ?[phone] ?

## 2021-09-30 NOTE — ED Notes (Signed)
Patient provided with [place] sandwich and soda.   ?

## 2021-10-14 ENCOUNTER — Emergency Department (HOSPITAL_COMMUNITY)
Admission: EM | Admit: 2021-10-14 | Discharge: 2021-10-14 | Disposition: A | Discharge status: Home / Self Care | Payer: Medicaid Other | Attending: Emergency Medicine | Admitting: Emergency Medicine

## 2021-10-14 ENCOUNTER — Emergency Department (HOSPITAL_COMMUNITY): Payer: Medicaid Other

## 2021-10-14 ENCOUNTER — Encounter (HOSPITAL_COMMUNITY): Payer: Self-pay

## 2021-10-14 DIAGNOSIS — R072 Precordial pain: Secondary | ICD-10-CM

## 2021-10-14 DIAGNOSIS — Z79899 Other long term (current) drug therapy: Secondary | ICD-10-CM | POA: Diagnosis not present

## 2021-10-14 DIAGNOSIS — Z3201 Encounter for pregnancy test, result positive: Secondary | ICD-10-CM

## 2021-10-14 DIAGNOSIS — R0789 Other chest pain: Secondary | ICD-10-CM | POA: Diagnosis present

## 2021-10-14 LAB — CBC
HCT: 36.4 % (ref 36.0–46.0)
Hemoglobin: 11.6 g/dL — ABNORMAL LOW (ref 12.0–15.0)
MCH: 27.1 pg (ref 26.0–34.0)
MCHC: 31.9 g/dL (ref 30.0–36.0)
MCV: 85 fL (ref 80.0–100.0)
Platelets: 348 10*3/uL (ref 150–400)
RBC: 4.28 MIL/uL (ref 3.87–5.11)
RDW: 14.3 % (ref 11.5–15.5)
WBC: 11.2 10*3/uL — ABNORMAL HIGH (ref 4.0–10.5)
nRBC: 0 % (ref 0.0–0.2)

## 2021-10-14 LAB — BASIC METABOLIC PANEL
Anion gap: 7 (ref 5–15)
BUN: 13 mg/dL (ref 6–20)
CO2: 22 mmol/L (ref 22–32)
Calcium: 8.9 mg/dL (ref 8.9–10.3)
Chloride: 105 mmol/L (ref 98–111)
Creatinine, Ser: 0.65 mg/dL (ref 0.44–1.00)
GFR, Estimated: >60 mL/min (ref >60)
Glucose, Bld: 85 mg/dL (ref 70–99)
Potassium: 3.6 mmol/L (ref 3.5–5.1)
Sodium: 134 mmol/L — ABNORMAL LOW (ref 135–145)

## 2021-10-14 LAB — TROPONIN I (HIGH SENSITIVITY)
Troponin I (High Sensitivity): <2 ng/L (ref <18)
Troponin I (High Sensitivity): <2 ng/L (ref <18)

## 2021-10-14 LAB — I-STAT BETA HCG BLOOD, ED (MC, WL, AP ONLY): I-stat hCG, quantitative: >2000 m[IU]/mL — ABNORMAL HIGH (ref <5)

## 2021-10-14 MED ORDER — ALUM & MAG HYDROXIDE-SIMETH 200-200-20 MG/5ML PO SUSP
30.0000 mL | Freq: Once | ORAL | Status: AC
  Administered 2021-10-14: 30 mL via ORAL
  Filled 2021-10-14: qty 30

## 2021-10-14 MED ORDER — ACETAMINOPHEN 325 MG PO TABS
650.0000 mg | ORAL_TABLET | Freq: Once | ORAL | Status: AC
  Administered 2021-10-14: 650 mg via ORAL
  Filled 2021-10-14: qty 2

## 2021-10-14 MED ORDER — FAMOTIDINE 20 MG PO TABS
20.0000 mg | ORAL_TABLET | Freq: Once | ORAL | Status: AC
Start: 2021-10-14 — End: 2021-10-14
  Administered 2021-10-14: 20 mg via ORAL
  Filled 2021-10-14: qty 1

## 2021-10-14 NOTE — ED Provider Notes (Signed)
?[facility] DEPT ?Provider Note ? ? ?CSN: [id number] ?Arrival date & time: 10/14/21  1836 ? ?  ? ?History ? ?Chief Complaint  ?Patient presents with  ? Chest Pain  ? ? ?Kristin Bruce is a 26 y.o. female. ? ?Patient c/o chest pain in past two weeks. Occurs at rest, randomly, midline, lower chest, dull, non radiating, not pleuritic. No associated sob, nv or diaphoresis. Denies any exertional cp or discomfort. No unusual doe or sob. No hx chd/heart disease. No hx dvt or pe. No leg pain or swelling, no recent trauma, travel, surgery or immobility. No cough or uri symptoms. No fever or chills. ?heartburn. No abd pain or vomiting. No back/flank pain. States had home pregnancy test 2 days ago and wants to verify. Denies abd or pelvic pain, no vaginal discharge or bleeding. Last period ~ 5 weeks ago.  ? ?The history is provided by the patient and medical records.  ?Chest Pain ?Associated symptoms: no abdominal pain, no back pain, no cough, no fever, no headache, no nausea, no palpitations, no shortness of breath and no vomiting   ? ?  ? ?Home Medications ?Prior to Admission medications   ?Medication Sig Start Date End Date Taking? Authorizing Provider  ?acetaminophen (TYLENOL) 500 MG tablet Take 1,000 mg by mouth every 6 (six) hours as needed for moderate pain.   Yes [provider]  ?hydrOXYzine (ATARAX) 25 MG tablet Take 1 tablet (25 mg total) by mouth every 6 (six) hours. ?Patient taking differently: Take 25 mg by mouth daily as needed for anxiety or itching. 09/30/21  Yes Tedd Sias, PA  ?Blood Pressure Monitoring (BLOOD PRESSURE KIT) DEVI 1 Device by Does not apply route as needed. 10/30/20   Caren Macadam, MD  ?ferrous sulfate 325 (65 FE) MG tablet Take 1 tablet (325 mg total) by mouth every other day. ?Patient not taking: Reported on 10/14/2021 02/06/21 02/06/22  Nugent, Gerrie Nordmann, NP  ?ibuprofen (ADVIL) 200 MG tablet Take 3 tablets (600 mg total) by mouth every 6  (six) hours as needed for mild pain or moderate pain. ?Patient not taking: Reported on 10/14/2021 05/01/21   Patriciaann Clan, DO  ?Prenatal Vit-Fe Fumarate-FA (PREPLUS) 27-1 MG TABS Take 1 tablet by mouth daily. ?Patient not taking: Reported on 10/14/2021 03/03/21   Renard Matter, MD  ?   ? ?Allergies    ?Patient has no known allergies.   ? ?Review of Systems   ?Review of Systems  ?Constitutional:  Negative for fever.  ?HENT:  Negative for sore throat.   ?Eyes:  Negative for redness.  ?Respiratory:  Negative for cough and shortness of breath.   ?Cardiovascular:  Positive for chest pain. Negative for palpitations and leg swelling.  ?Gastrointestinal:  Negative for abdominal pain, nausea and vomiting.  ?Genitourinary:  Negative for flank pain, pelvic pain and vaginal discharge.  ?Musculoskeletal:  Negative for back pain and neck pain.  ?Skin:  Negative for rash.  ?Neurological:  Negative for headaches.  ?Hematological:  Does not bruise/bleed easily.  ?Psychiatric/Behavioral:  Negative for confusion.   ? ?Physical Exam ?Updated Vital Signs ?BP 117/73   Pulse 80   Temp 99.1 ?F (37.3 ?C) (Oral)   Resp 20   Ht 1.524 m (5')   Wt 64.4 kg   LMP 09/07/2021 (Approximate)   SpO2 100%   BMI 27.73 kg/m?  ?Physical Exam ?Vitals and nursing note reviewed.  ?Constitutional:   ?   Appearance: Normal appearance. She is well-developed.  ?  HENT:  ?   Head: Atraumatic.  ?   Nose: Nose normal.  ?   Mouth/Throat:  ?   Mouth: Mucous membranes are moist.  ?Eyes:  ?   General: No scleral icterus. ?   Conjunctiva/sclera: Conjunctivae normal.  ?Neck:  ?   Trachea: No tracheal deviation.  ?Cardiovascular:  ?   Rate and Rhythm: Normal rate and regular rhythm.  ?   Pulses: Normal pulses.  ?   Heart sounds: Normal heart sounds. No murmur heard. ?  No friction rub. No gallop.  ?Pulmonary:  ?   Effort: Pulmonary effort is normal. No respiratory distress.  ?   Breath sounds: Normal breath sounds.  ?Abdominal:  ?   General: Bowel sounds are normal.  There is no distension.  ?   Palpations: Abdomen is soft.  ?   Tenderness: There is no abdominal tenderness. There is no guarding.  ?Genitourinary: ?   Comments: No cva tenderness.  ?Musculoskeletal:     ?   General: No swelling or tenderness.  ?   Cervical back: Normal range of motion and neck supple. No rigidity. No muscular tenderness.  ?   Right lower leg: No edema.  ?   Left lower leg: No edema.  ?Skin: ?   General: Skin is warm and dry.  ?   Findings: No rash.  ?Neurological:  ?   Mental Status: She is alert.  ?   Comments: Alert, speech normal.   ?Psychiatric:     ?   Mood and Affect: Mood normal.  ? ? ?ED Results / Procedures / Treatments   ?Labs ?(all labs ordered are listed, but only abnormal results are displayed) ?Results for orders placed or performed during the hospital encounter of 10/14/21  ?Basic metabolic panel  ?Result Value Ref Range  ? Sodium 134 (L) 135 - 145 mmol/L  ? Potassium 3.6 3.5 - 5.1 mmol/L  ? Chloride 105 98 - 111 mmol/L  ? CO2 22 22 - 32 mmol/L  ? Glucose, Bld 85 70 - 99 mg/dL  ? BUN 13 6 - 20 mg/dL  ? Creatinine, Ser 0.65 0.44 - 1.00 mg/dL  ? Calcium 8.9 8.9 - 10.3 mg/dL  ? GFR, Estimated >60 >60 mL/min  ? Anion gap 7 5 - 15  ?CBC  ?Result Value Ref Range  ? WBC 11.2 (H) 4.0 - 10.5 K/uL  ? RBC 4.28 3.87 - 5.11 MIL/uL  ? Hemoglobin 11.6 (L) 12.0 - 15.0 g/dL  ? HCT 36.4 36.0 - 46.0 %  ? MCV 85.0 80.0 - 100.0 fL  ? MCH 27.1 26.0 - 34.0 pg  ? MCHC 31.9 30.0 - 36.0 g/dL  ? RDW 14.3 11.5 - 15.5 %  ? Platelets 348 150 - 400 K/uL  ? nRBC 0.0 0.0 - 0.2 %  ?I-Stat beta hCG blood, ED  ?Result Value Ref Range  ? I-stat hCG, quantitative >2,000.0 (H) <5 mIU/mL  ? Comment 3          ?Troponin I (High Sensitivity)  ?Result Value Ref Range  ? Troponin I (High Sensitivity) <2 <18 ng/L  ?Troponin I (High Sensitivity)  ?Result Value Ref Range  ? Troponin I (High Sensitivity) <2 <18 ng/L  ? ?DG Chest 2 View ? ?Result Date: 10/14/2021 ?CLINICAL DATA:  chest pain EXAM: CHEST - 2 VIEW COMPARISON:  Chest  x-ray 09/30/2021 FINDINGS: The heart and mediastinal contours are within normal limits. No focal consolidation. No pulmonary edema. No pleural effusion. No pneumothorax. No acute  osseous abnormality. Dextrocurvature of the thoracic spine. IMPRESSION: No active cardiopulmonary disease. Electronically Signed   By: Iven Finn M.D.   On: 10/14/2021 19:35  ? ?DG Chest 2 View ? ?Result Date: 09/30/2021 ?CLINICAL DATA:  Substernal chest pain beginning 4 days ago. Nausea and fatigue. EXAM: CHEST - 2 VIEW COMPARISON:  None. FINDINGS: The heart size and mediastinal contours are within normal limits. Both lungs are clear. The visualized skeletal structures are unremarkable. Prominent nipple shadows bilaterally. IMPRESSION: No active cardiopulmonary disease. Electronically Signed   By: Lucienne Capers M.D.   On: 09/30/2021 00:02   ? ? ?EKG ?EKG Interpretation ? ?Date/Time:  Wednesday October 14 2021 18:47:31 EDT ?Ventricular Rate:  91 ?PR Interval:  133 ?QRS Duration: 69 ?QT Interval:  335 ?QTC Calculation: 413 ?R Axis:   60 ?Text Interpretation: Sinus rhythm Confirmed by Lajean Saver [phone]) on 10/14/2021 9:49:44 PM ? ?Radiology ?DG Chest 2 View ? ?Result Date: 10/14/2021 ?CLINICAL DATA:  chest pain EXAM: CHEST - 2 VIEW COMPARISON:  Chest x-ray 09/30/2021 FINDINGS: The heart and mediastinal contours are within normal limits. No focal consolidation. No pulmonary edema. No pleural effusion. No pneumothorax. No acute osseous abnormality. Dextrocurvature of the thoracic spine. IMPRESSION: No active cardiopulmonary disease. Electronically Signed   By: Iven Finn M.D.   On: 10/14/2021 19:35   ? ?Procedures ?Procedures  ? ? ?Medications Ordered in ED ?Medications  ?acetaminophen (TYLENOL) tablet 650 mg (has no administration in time range)  ?alum & mag hydroxide-simeth (MAALOX/MYLANTA) 200-200-20 MG/5ML suspension 30 mL (has no administration in time range)  ?famotidine (PEPCID) tablet 20 mg (has no administration in time  range)  ? ? ?ED Course/ Medical Decision Making/ A&P ?  ?                        ?Medical Decision Making ?Problems Addressed: ?Positive pregnancy test: acute illness or injury ?Precordial chest pain: acute illness or

## 2021-10-14 NOTE — ED Triage Notes (Signed)
Pt c/p generalized chest pain and sternal pain radiating to back and shob starting at 4am.  ? ?Also c/o fatigue and dizziness ? ?Denies N/V ? ?Pt reports taking a pregnancy test 2 days ago and was positive.  ? ?LMP-09/07/21  ? ?A/Ox4 ?

## 2021-10-14 NOTE — Discharge Instructions (Addendum)
It was our pleasure to provide your ER care today - we hope that you feel better. ? ?Drink plenty of fluids/stay well hydrated.  ? ?If gi/reflux symptoms, try taking pepcid, zantac, or maalox as need for symptom relief.  ? ?Follow up closely with primary care doctor and ob/gyn doctor (regarding your positive pregnancy test/early pregnancy) in the next 1-2 weeks - call office tomorrow AM to arrange appointment. ? ?Return to ER if worse, new symptoms, fevers, persistent/recurrent chest pain, increased trouble breathing, or other concern.  ? ?If vaginal bleeding or pelvic pain, go directly to the [facility]/MAU area at [facility].   ?

## 2021-10-14 NOTE — ED Provider Triage Note (Signed)
Emergency Medicine Provider Triage Evaluation Note ? ?Kristin Bruce , a 26 y.o. female  was evaluated in triage.  Pt complains of chest pain and shortness of breath.  Patient endorses substernal chest pain described as tightness with radiation to the right side of her jaw.  Patient describes the jaw pain and is feeling like her teeth are loose or are hurting.  She relates it to her chest pain.  The patient describes a general shortness of breath.  She denies fever or abdominal pain.  States that she took a pregnancy test 2 days ago which was positive.  States that she stopped taking her Atarax at that time due to concerns about safety in pregnancy. ? ?Review of Systems  ?Positive: Chest pain, Shortness of breath ?Negative: Abdominal pain, fever ? ?Physical Exam  ?BP 124/84 (BP Location: Right Arm)   Pulse 93   Temp 99.1 ?F (37.3 ?C) (Oral)   Resp 17   LMP 09/07/2021 (Approximate)   SpO2 99%  ?Gen:   Awake, no distress   ?Resp:  Normal effort  ?MSK:   Moves extremities without difficulty  ?Other:   ? ?Medical Decision Making  ?Medically screening exam initiated at 6:53 PM.  Appropriate orders placed.  Kristin Bruce was informed that the remainder of the evaluation will be completed by another provider, this initial triage assessment does not replace that evaluation, and the importance of remaining in the ED until their evaluation is complete. ? ? ?  ?Darrick Grinder, PA-C ?10/14/21 1855 ? ?

## 2021-10-17 ENCOUNTER — Inpatient Hospital Stay (HOSPITAL_COMMUNITY): Payer: Medicaid Other

## 2021-10-17 ENCOUNTER — Encounter (HOSPITAL_COMMUNITY): Payer: Self-pay | Admitting: Obstetrics & Gynecology

## 2021-10-17 ENCOUNTER — Inpatient Hospital Stay (HOSPITAL_COMMUNITY)
Admission: AD | Admit: 2021-10-17 | Discharge: 2021-10-17 | Disposition: A | Discharge status: Home / Self Care | Payer: Medicaid Other | Attending: Obstetrics & Gynecology | Admitting: Obstetrics & Gynecology

## 2021-10-17 ENCOUNTER — Other Ambulatory Visit: Payer: Self-pay

## 2021-10-17 DIAGNOSIS — O26899 Other specified pregnancy related conditions, unspecified trimester: Secondary | ICD-10-CM | POA: Diagnosis not present

## 2021-10-17 DIAGNOSIS — R1032 Left lower quadrant pain: Secondary | ICD-10-CM | POA: Diagnosis not present

## 2021-10-17 DIAGNOSIS — R109 Unspecified abdominal pain: Secondary | ICD-10-CM

## 2021-10-17 DIAGNOSIS — R519 Headache, unspecified: Secondary | ICD-10-CM | POA: Insufficient documentation

## 2021-10-17 DIAGNOSIS — R42 Dizziness and giddiness: Secondary | ICD-10-CM | POA: Diagnosis present

## 2021-10-17 DIAGNOSIS — Z3491 Encounter for supervision of normal pregnancy, unspecified, first trimester: Secondary | ICD-10-CM

## 2021-10-17 DIAGNOSIS — O26891 Other specified pregnancy related conditions, first trimester: Secondary | ICD-10-CM | POA: Diagnosis not present

## 2021-10-17 DIAGNOSIS — Z3A01 Less than 8 weeks gestation of pregnancy: Secondary | ICD-10-CM | POA: Diagnosis not present

## 2021-10-17 HISTORY — DX: Anxiety disorder, unspecified: F41.9

## 2021-10-17 LAB — URINALYSIS, ROUTINE W REFLEX MICROSCOPIC
Bilirubin Urine: NEGATIVE
Glucose, UA: NEGATIVE mg/dL
Hgb urine dipstick: NEGATIVE
Ketones, ur: 20 mg/dL — AB
Leukocytes,Ua: NEGATIVE
Nitrite: NEGATIVE
Protein, ur: NEGATIVE mg/dL
Specific Gravity, Urine: 1.027 (ref 1.005–1.030)
pH: 7 (ref 5.0–8.0)

## 2021-10-17 LAB — WET PREP, GENITAL
Sperm: NONE SEEN
Trich, Wet Prep: NONE SEEN
WBC, Wet Prep HPF POC: <10 (ref <10)
Yeast Wet Prep HPF POC: NONE SEEN

## 2021-10-17 LAB — CBC
HCT: 33.2 % — ABNORMAL LOW (ref 36.0–46.0)
Hemoglobin: 10.8 g/dL — ABNORMAL LOW (ref 12.0–15.0)
MCH: 27.5 pg (ref 26.0–34.0)
MCHC: 32.5 g/dL (ref 30.0–36.0)
MCV: 84.5 fL (ref 80.0–100.0)
Platelets: 320 10*3/uL (ref 150–400)
RBC: 3.93 MIL/uL (ref 3.87–5.11)
RDW: 13.8 % (ref 11.5–15.5)
WBC: 8 10*3/uL (ref 4.0–10.5)
nRBC: 0 % (ref 0.0–0.2)

## 2021-10-17 LAB — HCG, QUANTITATIVE, PREGNANCY: hCG, Beta Chain, Quant, S: 22963 m[IU]/mL — ABNORMAL HIGH (ref <5)

## 2021-10-17 NOTE — MAU Provider Note (Signed)
?History  ?  ? ?CSN: [id number] ? ?Arrival date and time: 10/17/21 1923 ? ? None  ?  ? ?Chief Complaint  ?Patient presents with  ? Dizziness  ? Abdominal Pain  ? Headache  ? ?HPI ?Kristin Bruce is a 26 y.o. [phone] at 66w5dby LMP who presents to MAU for lower abdominal pain and headache. Patient [facility] She was [facility] at that time for chest pain with a normal work up. Was told that it was related to panic attacks. She does not currently have chest pain. Reports LLQ pain started last night. Pain is intermittent and dull. She denies vaginal bleeding, itching/odor, urinary s/s, fever, constipation, vomiting, or diarrhea. She does report a white discharge. She also reports a dull headache. Patient has not tried anything to relieve pain. LMP 09/07/21. ? ?[facility] is unsure if she is wanting to continue pregnancy as her youngest child is only 670 monthsold.  ? ? ?OB History   ? ? Gravida  ?5  ? Para  ?3  ? Term  ?1  ? Preterm  ?2  ? AB  ?1  ? Living  ?3  ?  ? ? SAB  ?1  ? IAB  ?0  ? Ectopic  ?0  ? Multiple  ?0  ? Live Births  ?3  ?   ?  ?  ? ? ?Past Medical History:  ?Diagnosis Date  ? Anemia   ? Anxiety   ? Depression   ? HSV (herpes simplex virus) anogenital infection   ? Suicidal thoughts   ? during 1st pregnancy  ? Wears glasses   ? ? ?Past Surgical History:  ?Procedure Laterality Date  ? INCISION AND DRAINAGE ABSCESS N/A 05/13/2018  ? Procedure: INCISION AND DRAINAGE, PILONIDAL  ABSCESS;  Surgeon: WJudeth Horn MD;  [place]  Service: General;  Laterality: N/A;  ? [place]  ? Procedure: IRRIGATION AND DEBRIDEMENT PERIRECTAL ABSCESS;  Surgeon: TGeorganna Skeans MD;  [place]  Service: General;  Laterality: N/A;  ? PILONIDAL CYST EXCISION N/A 07/20/2018  ? Procedure: EXCISION OF PILONIDAL DISEASE;  Surgeon: WIleana Roup MD;  [facility]  Service: General;  Laterality: N/A;  ? ? ?Family History  ?Problem Relation Age of Onset  ? Diabetes Mother   ? Hypertension Mother   ? Heart failure Mother   ? Diabetes Maternal Grandfather   ? ? ?Social History  ? ?Tobacco Use  ? Smoking status: Never  ? Smokeless tobacco: Never  ?Vaping Use  ? Vaping Use: Never used  ?Substance Use Topics  ? Alcohol use: Not Currently  ?  Comment: not since pregnancy  ? Drug use: Not Currently  ?  Types: Marijuana  ?  Comment: last use was about 10 weeks ago-stated it waws giving her chest pain and anxiety  ? ? ?Allergies: No Known Allergies ? ?Medications Prior to Admission  ?Medication Sig Dispense Refill Last Dose  ? acetaminophen (TYLENOL) 500 MG tablet Take 1,000 mg by mouth every 6 (six) hours as needed for moderate pain.     ? Blood Pressure Monitoring (BLOOD PRESSURE KIT) DEVI 1 Device by Does not apply route as needed. 1 each 0   ? ferrous sulfate 325 (65 FE) MG tablet Take 1 tablet (325 mg total) by mouth every other day. (Patient not taking: Reported  on 10/14/2021) 30 tablet 3   ? hydrOXYzine (ATARAX) 25 MG tablet Take 1 tablet (25 mg total) by mouth every 6 (six) hours. (Patient taking differently: Take 25 mg by mouth daily as needed for anxiety or itching.) 21 tablet 0   ? ibuprofen (ADVIL) 200 MG tablet Take 3 tablets (600 mg total) by mouth every 6 (six) hours as needed for mild pain or moderate pain. (Patient not taking: Reported on 10/14/2021) 90 tablet    ? Prenatal Vit-Fe Fumarate-FA (PREPLUS) 27-1 MG TABS Take 1 tablet by mouth daily. (Patient not taking: Reported on 10/14/2021) 90 tablet 2   ? ?Review of Systems  ?Constitutional: Negative.   ?Respiratory: Negative.    ?Cardiovascular: Negative.   ?Gastrointestinal:  Positive for abdominal pain. Negative for constipation, diarrhea and vomiting.  ?Genitourinary:  Positive for vaginal discharge. Negative for dysuria and vaginal bleeding.  ?Musculoskeletal: Negative.   ?Neurological:  Positive for  headaches.  ?Psychiatric/Behavioral: Negative.    ?Physical Exam  ? ?Blood pressure 118/78, pulse 97, temperature 99.4 ?F (37.4 ?C), temperature source Oral, resp. rate 17, height 5' (1.524 m), weight 47.9 kg, last menstrual period 09/07/2021, SpO2 100 %, unknown if currently breastfeeding. ? ?Physical Exam ?Vitals and nursing note reviewed. Exam conducted with a chaperone present.  ?Constitutional:   ?   General: She is not in acute distress. ?Cardiovascular:  ?   Rate and Rhythm: Normal rate.  ?Pulmonary:  ?   Effort: Pulmonary effort is normal.  ?Abdominal:  ?   General: Abdomen is flat.  ?   Palpations: Abdomen is soft.  ?   Tenderness: There is no abdominal tenderness.  ?Genitourinary: ?   Comments: Blind swabs collected ?Skin: ?   General: Skin is warm and dry.  ?Neurological:  ?   General: No focal deficit present.  ?   Mental Status: She is alert and oriented to person, place, and time.  ?Psychiatric:     ?   Mood and Affect: Mood normal.     ?   Behavior: Behavior normal.  ? ? ?US OB LESS THAN 14 WEEKS WITH OB TRANSVAGINAL ? ?Result Date: 10/17/2021 ?CLINICAL DATA:  Left lower quadrant cramping.  No vaginal bleeding. EXAM: OBSTETRIC <14 WK [place] AND TRANSVAGINAL OB US TECHNIQUE: Both transabdominal and transvaginal ultrasound examinations were performed for complete evaluation of the gestation as well as the maternal uterus, adnexal regions, and pelvic cul-de-sac. Transvaginal technique was performed to assess early pregnancy. COMPARISON:  None. FINDINGS: Intrauterine gestational sac: Single Yolk sac:  Visualized. Embryo:  Not Visualized. Cardiac Activity: Not Visualized. MSD: 1.25 cm 6 w   0 d Subchorionic hemorrhage:  None visualized. Maternal uterus/adnexae: Bilateral ovaries appear within normal limits. Corpus luteum noted in the left ovary. No free fluid. IMPRESSION: 1. Single intrauterine gestational sac measuring 6 weeks 0 days by mean gestational sac diameter. There is no fetal pole identified at this  time which may be within normal limits for a sac of this size. Recommend short-term follow-up ultrasound in 1-2 weeks and correlation with serial beta HCG to confirm viability. Electronically Signed   By: Ronney Asters M.D.   On: 10/17/2021 21:30   ? ?MAU Course  ?Procedures ? ?MDM ?EKG normal ?UA and wet prep negative. GC/CT pending ?CBC stable. Mild anemia but at baseline when compared to previous results. HCG >22,000. [place] with results as above- gestational and yolk sac present. Small corpus luteal cyst noted on left side which may be cause of patient's pain. Patient  was reassured that this is a normal finding.   ? ?Assessment and Plan  ?IUP ?Abdominal pain affecting pregnancy ? ?- Discharge home in stable condition ?- Return to MAU sooner or as needed for worsening symptoms ?- Strict return precautions reviewed ?- Keep OB appointment as [facility] on [facility] ? ? ? ?Renee Harder, CNM ?10/17/2021, 10:36 PM  ?

## 2021-10-17 NOTE — Discharge Instructions (Signed)
Safe Medications in Pregnancy  ? ? ?Acne: ?Benzoyl Peroxide ?Salicylic Acid ? ?Backache/Headache: ?Tylenol: 2 regular strength every 4 hours OR ?             2 Extra strength every 6 hours ? ?Colds/Coughs/Allergies: ?Benadryl (alcohol free) 25 mg every 6 hours as needed ?Breath right strips ?Claritin ?Cepacol throat lozenges ?Chloraseptic throat spray ?Cold-Eeze- up to three times per day ?Cough drops, alcohol free ?Flonase (by prescription only) ?Guaifenesin ?Mucinex ?Robitussin DM (plain only, alcohol free) ?Saline nasal spray/drops ?Sudafed (pseudoephedrine) & Actifed ** use only after [redacted] weeks gestation and if you do not have high blood pressure ?Tylenol ?Vicks Vaporub ?Zinc lozenges ?Zyrtec  ? ?Constipation: ?Colace ?Ducolax suppositories ?Fleet enema ?Glycerin suppositories ?Metamucil ?Milk of magnesia ?Miralax ?Senokot ?Smooth move tea ? ?Diarrhea: ?Kaopectate ?Imodium A-D ? ?*NO pepto Bismol ? ?Hemorrhoids: ?Anusol ?Anusol HC ?Preparation H ?Tucks ? ?Indigestion: ?Tums ?Maalox ?Mylanta ?Zantac  ?Pepcid ? ?Insomnia: ?Benadryl (alcohol free) 25mg  every 6 hours as needed ?Tylenol PM ?Unisom, no Gelcaps ? ?Leg Cramps: ?Tums ?MagGel ? ?Nausea/Vomiting:  ?Bonine ?Dramamine ?Emetrol ?Ginger extract ?Sea bands ?Meclizine  ?Nausea medication to take during pregnancy:  ?Unisom (doxylamine succinate 25 mg tablets) Take one tablet daily at bedtime. If symptoms are not adequately controlled, the dose can be increased to a maximum recommended dose of two tablets daily (1/2 tablet in the morning, 1/2 tablet mid-afternoon and one at bedtime). ?Vitamin B6 100mg  tablets. Take one tablet twice a day (up to 200 mg per day). ? ?Skin Rashes: ?Aveeno products ?Benadryl cream or 25mg  every 6 hours as needed ?Calamine Lotion ?1% cortisone cream ? ?Yeast infection: ?Gyne-lotrimin 7 ?Monistat 7 ? ? ?**If taking multiple medications, please check labels to avoid duplicating the same active ingredients ?**take  medication as directed on the label ?** Do not exceed 4000 mg of tylenol in 24 hours ?**Do not take medications that contain aspirin or ibuprofen ? ? ?[facility] - ?Address: [address] [place]. [address], [place], [place] [address] [address] ?Hours:  ?Monday 9AM-5PM ?Tuesday 10AM-6PM ?Wednesday 11AM-7PM ?Thursday 9AM-5PM ?Friday              8AM-2PM ? ?Phone: [phone] ? ?[facility] ?Address: [address] [place], [place] [phone] ?Hours: ?Monday            8AM-4PM ?Tuesday           8AM-4PM ?Wednesday      8AM-4PM ?Thursday          8AM-4PM ?Friday               8AM-4PM ?Saturday           8AM-12PM ? ?Phone: [phone] ?

## 2021-10-17 NOTE — MAU Note (Signed)
Pt states she feels the chest pain is a result of her anxiety. Unsure of continuing pregnancy states her 3rd baby is only 63 months old. ?

## 2021-10-17 NOTE — MAU Note (Addendum)
.  Kristin Bruce is a 26 y.o. at 5w 5d here in MAU reporting: left sided abdominal cramping that started yesterday night.  No VB but is having some white discharge.  Pt also reports headache that she reports not taking any pain medication.   ?LMP: 09/07/21 ?Onset of complaint: last night 10/16/21 ?Pain score: 5 ?Vitals:  ? 10/17/21 1942  ?SpO2: 100%  ?   ?[id number] to obtain due to early gestation ?Lab orders placed from triage:  UA   ?

## 2021-10-19 ENCOUNTER — Other Ambulatory Visit: Payer: Self-pay

## 2021-10-19 LAB — GC/CHLAMYDIA PROBE AMP (~~LOC~~) NOT AT ARMC
Chlamydia: NEGATIVE
Comment: NEGATIVE
Comment: NORMAL
Neisseria Gonorrhea: NEGATIVE

## 2021-10-21 ENCOUNTER — Other Ambulatory Visit: Payer: Self-pay

## 2021-10-21 ENCOUNTER — Encounter (HOSPITAL_COMMUNITY): Payer: Self-pay | Admitting: Emergency Medicine

## 2021-10-21 ENCOUNTER — Emergency Department (HOSPITAL_COMMUNITY)
Admission: EM | Admit: 2021-10-21 | Discharge: 2021-10-22 | Disposition: A | Discharge status: Home / Self Care | Payer: Medicaid Other | Attending: Emergency Medicine | Admitting: Emergency Medicine

## 2021-10-21 DIAGNOSIS — O26891 Other specified pregnancy related conditions, first trimester: Secondary | ICD-10-CM | POA: Diagnosis present

## 2021-10-21 DIAGNOSIS — E876 Hypokalemia: Secondary | ICD-10-CM | POA: Insufficient documentation

## 2021-10-21 DIAGNOSIS — Z3A01 Less than 8 weeks gestation of pregnancy: Secondary | ICD-10-CM | POA: Insufficient documentation

## 2021-10-21 DIAGNOSIS — R42 Dizziness and giddiness: Secondary | ICD-10-CM | POA: Insufficient documentation

## 2021-10-21 DIAGNOSIS — Z3491 Encounter for supervision of normal pregnancy, unspecified, first trimester: Secondary | ICD-10-CM

## 2021-10-21 DIAGNOSIS — R079 Chest pain, unspecified: Secondary | ICD-10-CM | POA: Diagnosis not present

## 2021-10-21 DIAGNOSIS — R5383 Other fatigue: Secondary | ICD-10-CM

## 2021-10-21 LAB — COMPREHENSIVE METABOLIC PANEL
ALT: 16 U/L (ref 0–44)
AST: 12 U/L — ABNORMAL LOW (ref 15–41)
Albumin: 3.7 g/dL (ref 3.5–5.0)
Alkaline Phosphatase: 42 U/L (ref 38–126)
Anion gap: 8 (ref 5–15)
BUN: 11 mg/dL (ref 6–20)
CO2: 19 mmol/L — ABNORMAL LOW (ref 22–32)
Calcium: 8.9 mg/dL (ref 8.9–10.3)
Chloride: 105 mmol/L (ref 98–111)
Creatinine, Ser: 0.58 mg/dL (ref 0.44–1.00)
GFR, Estimated: >60 mL/min (ref >60)
Glucose, Bld: 113 mg/dL — ABNORMAL HIGH (ref 70–99)
Potassium: 3.3 mmol/L — ABNORMAL LOW (ref 3.5–5.1)
Sodium: 132 mmol/L — ABNORMAL LOW (ref 135–145)
Total Bilirubin: 1.2 mg/dL (ref 0.3–1.2)
Total Protein: 6.8 g/dL (ref 6.5–8.1)

## 2021-10-21 LAB — CBC WITH DIFFERENTIAL/PLATELET
Abs Immature Granulocytes: 0.02 10*3/uL (ref 0.00–0.07)
Basophils Absolute: 0 10*3/uL (ref 0.0–0.1)
Basophils Relative: 0 %
Eosinophils Absolute: 0 10*3/uL (ref 0.0–0.5)
Eosinophils Relative: 1 %
HCT: 33.4 % — ABNORMAL LOW (ref 36.0–46.0)
Hemoglobin: 10.9 g/dL — ABNORMAL LOW (ref 12.0–15.0)
Immature Granulocytes: 0 %
Lymphocytes Relative: 32 %
Lymphs Abs: 2.3 10*3/uL (ref 0.7–4.0)
MCH: 27.9 pg (ref 26.0–34.0)
MCHC: 32.6 g/dL (ref 30.0–36.0)
MCV: 85.6 fL (ref 80.0–100.0)
Monocytes Absolute: 0.4 10*3/uL (ref 0.1–1.0)
Monocytes Relative: 5 %
Neutro Abs: 4.6 10*3/uL (ref 1.7–7.7)
Neutrophils Relative %: 62 %
Platelets: 327 10*3/uL (ref 150–400)
RBC: 3.9 MIL/uL (ref 3.87–5.11)
RDW: 13.8 % (ref 11.5–15.5)
WBC: 7.3 10*3/uL (ref 4.0–10.5)
nRBC: 0 % (ref 0.0–0.2)

## 2021-10-21 LAB — TROPONIN I (HIGH SENSITIVITY): Troponin I (High Sensitivity): 3 ng/L (ref <18)

## 2021-10-21 NOTE — ED Triage Notes (Signed)
Pt c/o chest pain and dizziness x 2 weeks.  ?

## 2021-10-21 NOTE — ED Provider Triage Note (Signed)
Emergency Medicine Provider OB Triage Evaluation Note ? ?Kristin Bruce is a 26 y.o. female, [phone], at [redacted]w[redacted]d gestation who presents to the emergency department with complaints of chest pain, dizziness.  Evaluated on 29 March for the same complaint. ? ?Review of  Systems  ?Positive: chest pain, dizziness ?Negative: vaginal discharge, vaginal bleeding ? ?Physical Exam  ?BP 110/77 (BP Location: Left Arm)   Pulse 77   Temp 99.6 ?F (37.6 ?C) (Oral)   Resp 16   LMP 09/07/2021 (Approximate)   SpO2 100%  ?General: Awake, no distress  ?HEENT: Atraumatic  ?Resp: Normal effort  ?Cardiac: Normal rate ?Abd: Nondistended, nontender  ?MSK: Moves all extremities without difficulty ?Neuro: Speech clear ? ?Medical Decision Making  ?Pt evaluated for pregnancy concern and is stable for transfer to MAU. Pt is in agreement with plan for transfer. ? ?8:50 PM Discussed with MAU APP, LISA, who declines patient in transfer. ? ?Clinical Impression  ?No diagnosis found. ? ?8:50 PM spoke to [place] APP at [facility] who is requesting cardiac clearance prior to being evaluated by midwives providers. ?  Claude Manges, PA-C ?10/21/21 2051 ? ?

## 2021-10-22 LAB — TROPONIN I (HIGH SENSITIVITY): Troponin I (High Sensitivity): <2 ng/L (ref <18)

## 2021-10-22 MED ORDER — SODIUM CHLORIDE 0.9 % IV BOLUS
1000.0000 mL | Freq: Once | INTRAVENOUS | Status: AC
  Administered 2021-10-22: 1000 mL via INTRAVENOUS

## 2021-10-22 MED ORDER — ACETAMINOPHEN 500 MG PO TABS
1000.0000 mg | ORAL_TABLET | Freq: Once | ORAL | Status: AC
  Administered 2021-10-22: 1000 mg via ORAL
  Filled 2021-10-22: qty 2

## 2021-10-22 MED ORDER — POTASSIUM CHLORIDE CRYS ER 20 MEQ PO TBCR: 20.0000 meq | EXTENDED_RELEASE_TABLET | Freq: Every day | ORAL | 0 refills | Status: DC

## 2021-10-22 NOTE — ED Provider Notes (Signed)
?[place] ?Provider Note ? ?CSN: [id number] ?Arrival date & time: 10/21/21 2015 ? ?Chief Complaint(s) ?Chest Pain ? ?HPI ?Kristin Bruce is a 26 y.o. female   ? ?The history is provided by the patient.  ?Weakness ?Severity:  Moderate ?Onset quality:  Gradual ?Duration:  3 weeks ?Timing:  Constant ?Progression:  Worsening ?Chronicity:  New ?Context comment:  Currently approx [redacted]wk pregnant ?Relieved by:  Nothing ?Worsened by:  Activity ?Associated symptoms: chest pain and dizziness   ?Associated symptoms: no abdominal pain, no cough, no diarrhea, no fever, no foul-smelling urine, no headaches, no nausea, no shortness of breath and no vomiting   ?Chest pain:  ?  Quality: aching   ?  Severity:  Moderate ?  Onset quality:  Gradual ?  Duration:  2 weeks ?  Timing:  Constant ?  Progression:  Waxing and waning ?  Chronicity:  New (worse with movement. not exertional) ? ?Seen for same in the last week. Work up was reassuring. ? ?Past Medical History ?Past Medical History:  ?Diagnosis Date  ? Anemia   ? Anxiety   ? Depression   ? HSV (herpes simplex virus) anogenital infection   ? Suicidal thoughts   ? during 1st pregnancy  ? Wears glasses   ? ?Patient Active Problem List  ? Diagnosis Date Noted  ? Premature rupture of membranes 04/29/2021  ? SVD (spontaneous vaginal delivery) 04/29/2021  ? Alpha thalassemia silent carrier 12/09/2020  ? Supervision of high risk pregnancy, antepartum 10/30/2020  ? Migraine 10/30/2020  ? History of pre-eclampsia 09/30/2020  ? Herpes simplex type 1 infection 06/09/2017  ? Anemia of pregnancy 01/07/2016  ? Adjustment disorder with depressed mood 11/22/2015  ? ?Home Medication(s) ?Prior to Admission medications   ?Medication Sig Start Date End Date Taking? Authorizing Provider  ?acetaminophen (TYLENOL) 500 MG tablet Take 1,000 mg by mouth every 6 (six) hours as needed for moderate pain.    [provider]  ?Blood Pressure Monitoring (BLOOD PRESSURE  KIT) DEVI 1 Device by Does not apply route as needed. 10/30/20   Caren Macadam, MD  ?ferrous sulfate 325 (65 FE) MG tablet Take 1 tablet (325 mg total) by mouth every other day. ?Patient not taking: Reported on 10/14/2021 02/06/21 02/06/22  Nugent, Gerrie Nordmann, NP  ?hydrOXYzine (ATARAX) 25 MG tablet Take 1 tablet (25 mg total) by mouth every 6 (six) hours. ?Patient taking differently: Take 25 mg by mouth daily as needed for anxiety or itching. 09/30/21   Tedd Sias, PA  ?potassium chloride SA (KLOR-CON M) 20 MEQ tablet Take 1 tablet (20 mEq total) by mouth daily for 10 days. 10/22/21 11/01/21  Fatima Blank, MD  ?Prenatal Vit-Fe Fumarate-FA (PREPLUS) 27-1 MG TABS Take 1 tablet by mouth daily. ?Patient not taking: Reported on 10/14/2021 03/03/21   Renard Matter, MD  ?                                                                                                                                  ?  Allergies ?Patient has no known allergies. ? ?Review of Systems ?Review of Systems  ?Constitutional:  Negative for fever.  ?Respiratory:  Negative for cough and shortness of breath.   ?Cardiovascular:  Positive for chest pain.  ?Gastrointestinal:  Negative for abdominal pain, diarrhea, nausea and vomiting.  ?Neurological:  Positive for dizziness and weakness. Negative for headaches.  ?As noted in HPI ? ?Physical Exam ?Vital Signs  ?I have reviewed the triage vital signs ?BP 103/74   Pulse 66   Temp 99.6 ?F (37.6 ?C) (Oral)   Resp 17   LMP 09/07/2021 (Approximate)   SpO2 100%  ? ?Physical Exam ?Vitals reviewed.  ?Constitutional:   ?   General: She is not in acute distress. ?   Appearance: She is well-developed. She is not diaphoretic.  ?HENT:  ?   Head: Normocephalic and atraumatic.  ?   Nose: Nose normal.  ?Eyes:  ?   General: No scleral icterus.    ?   Right eye: No discharge.     ?   Left eye: No discharge.  ?   Conjunctiva/sclera: Conjunctivae normal.  ?   Pupils: Pupils are equal, round, and reactive to  light.  ?Cardiovascular:  ?   Rate and Rhythm: Normal rate and regular rhythm.  ?   Heart sounds: No murmur heard. ?  No friction rub. No gallop.  ?Pulmonary:  ?   Effort: Pulmonary effort is normal. No respiratory distress.  ?   Breath sounds: Normal breath sounds. No stridor. No rales.  ?Chest:  ?   Chest wall: Tenderness present.  ? ? ?Abdominal:  ?   General: There is no distension.  ?   Palpations: Abdomen is soft.  ?   Tenderness: There is no abdominal tenderness.  ?Musculoskeletal:  ?   Cervical back: Normal range of motion and neck supple.  ?   Thoracic back: Spasms and tenderness present.  ?     Back: ? ?Skin: ?   General: Skin is warm and dry.  ?   Findings: No erythema or rash.  ?Neurological:  ?   Mental Status: She is alert and oriented to person, place, and time.  ? ? ?ED Results and Treatments ?Labs ?(all labs ordered are listed, but only abnormal results are displayed) ?Labs Reviewed  ?CBC WITH DIFFERENTIAL/PLATELET - Abnormal; Notable for the following components:  ?    Result Value  ? Hemoglobin 10.9 (*)   ? HCT 33.4 (*)   ? All other components within normal limits  ?COMPREHENSIVE METABOLIC PANEL - Abnormal; Notable for the following components:  ? Sodium 132 (*)   ? Potassium 3.3 (*)   ? CO2 19 (*)   ? Glucose, Bld 113 (*)   ? AST 12 (*)   ? All other components within normal limits  ?TROPONIN I (HIGH SENSITIVITY)  ?TROPONIN I (HIGH SENSITIVITY)  ?                                                                                                                       ?  EKG ? EKG Interpretation ? ?Date/Time:  Thursday October 22 2021 06:27:09 EDT ?Ventricular Rate:  76 ?PR Interval:  131 ?QRS Duration: 78 ?QT Interval:  368 ?QTC Calculation: 414 ?R Axis:   51 ?Text Interpretation: Sinus rhythm No acute changes Confirmed by Addison Lank [phone]) on 10/22/2021 6:33:21 AM ?  ? ?  ? ?Radiology ?No results found. ? ?Pertinent labs & imaging results that were available during my care of the patient were  reviewed by me and considered in my medical decision making (see MDM for details). ? ?Medications Ordered in ED ?Medications  ?sodium chloride 0.9 % bolus 1,000 mL (1,000 mLs Intravenous New Bag/Given 10/22/21 0708)  ?acetaminophen (TYLENOL) tablet 1,000 mg (1,000 mg Oral Given 10/22/21 0708)  ?                                                               ?                                                                    ?Procedures ?Procedures ? ?(including critical care time) ? ?Medical Decision Making / ED Course ? ? ? Complexity of Problem: ? ?Co-morbidities/SDOH that complicate the patient evaluation/care: ?Currently pregnant ? ?Additional history obtained: ?MAU notes stating that her chest pain is likely related to anxiety. ? ?Patient's presenting problem/concern, DDX, and MDM listed below: ?Generalized fatigue ?Likely Pregnancy-related. Will assess for electrolyte/metabolic derangements. Assess for anemia. Recent UA was negative. ?Chest pain ?Today her pain seems muscular related. Doubt ACS, pericarditis, PE. ?MAU need cardiac clearance prior to seeing the patient. ? ?Hospitalization Considered:  ?yes ? ?Initial Intervention:  ?none ? ?  Complexity of Data: ?  ?Cardiac Monitoring: ?EKG w/o acute ischemic changes, evidence of pericarditis, or dysrhythmias ? ?Laboratory Tests ordered listed below with my independent interpretation: ?CBC without leukocytosis. Stable Hb from 5 days ago ?Mild hypokalemia, no other significant electrolyte derangements or renal insuffiency  ?Serial trops negative x 2. ?  ?Imaging Studies ordered listed below with my independent interpretation: ?N/a ?  ?  ?ED Course:   ? ?Assessment, Add'l Intervention, and Reassessment: ?Generalized fatigue ?Orthostatics were slightly + ?Mild hypokalemia w/o other significant electrolyte or metabolic derangements ?Hemoglobin stable ?Provided with IV fluids  ?Chest pain ?Highly atypical for and rule out for ACS. No consistent with pericarditis, or  PE ?Most consistent with muscular pain. ? ?Final Clinical Impression(s) / ED Diagnoses ?Final diagnoses:  ?Other fatigue  ?First trimester pregnancy  ?Chest pain in adult  ?Hypokalemia  ? ?The patient appears reasonably

## 2021-10-29 ENCOUNTER — Encounter: Payer: Self-pay | Admitting: Family Medicine

## 2021-10-29 ENCOUNTER — Emergency Department (HOSPITAL_COMMUNITY)
Admission: EM | Admit: 2021-10-29 | Discharge: 2021-10-30 | Disposition: A | Discharge status: Home / Self Care | Payer: Medicaid Other | Attending: Emergency Medicine | Admitting: Emergency Medicine

## 2021-10-29 ENCOUNTER — Encounter (HOSPITAL_COMMUNITY): Payer: Self-pay | Admitting: Emergency Medicine

## 2021-10-29 ENCOUNTER — Other Ambulatory Visit: Payer: Self-pay

## 2021-10-29 DIAGNOSIS — R519 Headache, unspecified: Secondary | ICD-10-CM | POA: Diagnosis not present

## 2021-10-29 DIAGNOSIS — O99891 Other specified diseases and conditions complicating pregnancy: Secondary | ICD-10-CM | POA: Diagnosis not present

## 2021-10-29 DIAGNOSIS — O26891 Other specified pregnancy related conditions, first trimester: Secondary | ICD-10-CM | POA: Diagnosis present

## 2021-10-29 DIAGNOSIS — R42 Dizziness and giddiness: Secondary | ICD-10-CM | POA: Insufficient documentation

## 2021-10-29 DIAGNOSIS — Z3A08 8 weeks gestation of pregnancy: Secondary | ICD-10-CM | POA: Insufficient documentation

## 2021-10-29 NOTE — ED Triage Notes (Addendum)
Patient presents with headaches and dizziness. She usually has headaches, but it is worse today. She also complains of weakness. Patient is [redacted] weeks pregnant. ? ? ?EMS vitals: ?130/78 BP ?108 HR ?16 RR ?97% SPO2 on room air ?108 CBG ?

## 2021-10-29 NOTE — ED Provider Triage Note (Signed)
Emergency Medicine Provider Triage Evaluation Note ? ?Kristin Bruce , a 26 y.o. female  was evaluated in triage.  Pt complains of weakness and headache.  She is been having headaches for the last week, feels generally weak at home.  States she has been feeling lightheaded as if she is going to pass out but has not had any syncopal episodes.  States she is [redacted] weeks pregnant, not having any abdominal pain or vaginal bleeding.   ? ? ? ?Review of Systems  ?PER HPI ? ?Physical Exam  ?BP 104/73 (BP Location: Left Arm)   Pulse 100   Temp 98.8 ?F (37.1 ?C) (Oral)   Resp 16   LMP 09/07/2021 (Approximate)   SpO2 100%  ?Gen:   Awake, no distress   ?Resp:  Normal effort  ?MSK:   Moves extremities without difficulty  ?Other:  Cranial nerves III through XII are grossly intact.  Grip strength equal bilaterally, no pronator drift ? ?Medical Decision Making  ?Medically screening exam initiated at 11:13 PM.  Appropriate orders placed.  Kristin Bruce was informed that the remainder of the evaluation will be completed by another provider, this initial triage assessment does not replace that evaluation, and the importance of remaining in the ED until their evaluation is complete. ? ? ?  ?Sherrill Raring, PA-C ?10/29/21 2314 ? ?

## 2021-10-30 ENCOUNTER — Encounter (HOSPITAL_COMMUNITY): Payer: Self-pay | Admitting: Emergency Medicine

## 2021-10-30 LAB — CBC
HCT: 31.5 % — ABNORMAL LOW (ref 36.0–46.0)
Hemoglobin: 10.2 g/dL — ABNORMAL LOW (ref 12.0–15.0)
MCH: 27.9 pg (ref 26.0–34.0)
MCHC: 32.4 g/dL (ref 30.0–36.0)
MCV: 86.3 fL (ref 80.0–100.0)
Platelets: 339 10*3/uL (ref 150–400)
RBC: 3.65 MIL/uL — ABNORMAL LOW (ref 3.87–5.11)
RDW: 14.6 % (ref 11.5–15.5)
WBC: 9.1 10*3/uL (ref 4.0–10.5)
nRBC: 0 % (ref 0.0–0.2)

## 2021-10-30 LAB — DIFFERENTIAL
Abs Immature Granulocytes: 0.12 10*3/uL — ABNORMAL HIGH (ref 0.00–0.07)
Basophils Absolute: 0 10*3/uL (ref 0.0–0.1)
Basophils Relative: 0 %
Eosinophils Absolute: 0 10*3/uL (ref 0.0–0.5)
Eosinophils Relative: 0 %
Immature Granulocytes: 1 %
Lymphocytes Relative: 16 %
Lymphs Abs: 1.5 10*3/uL (ref 0.7–4.0)
Monocytes Absolute: 0.4 10*3/uL (ref 0.1–1.0)
Monocytes Relative: 4 %
Neutro Abs: 7.1 10*3/uL (ref 1.7–7.7)
Neutrophils Relative %: 79 %

## 2021-10-30 LAB — BASIC METABOLIC PANEL
Anion gap: 4 — ABNORMAL LOW (ref 5–15)
BUN: 18 mg/dL (ref 6–20)
CO2: 21 mmol/L — ABNORMAL LOW (ref 22–32)
Calcium: 9 mg/dL (ref 8.9–10.3)
Chloride: 109 mmol/L (ref 98–111)
Creatinine, Ser: 0.46 mg/dL (ref 0.44–1.00)
GFR, Estimated: >60 mL/min (ref >60)
Glucose, Bld: 87 mg/dL (ref 70–99)
Potassium: 4 mmol/L (ref 3.5–5.1)
Sodium: 134 mmol/L — ABNORMAL LOW (ref 135–145)

## 2021-10-30 LAB — URINALYSIS, ROUTINE W REFLEX MICROSCOPIC
Bilirubin Urine: NEGATIVE
Glucose, UA: NEGATIVE mg/dL
Hgb urine dipstick: NEGATIVE
Ketones, ur: NEGATIVE mg/dL
Leukocytes,Ua: NEGATIVE
Nitrite: NEGATIVE
Protein, ur: NEGATIVE mg/dL
Specific Gravity, Urine: 1.018 (ref 1.005–1.030)
pH: 7 (ref 5.0–8.0)

## 2021-10-30 LAB — HCG, QUANTITATIVE, PREGNANCY: hCG, Beta Chain, Quant, S: 126264 m[IU]/mL — ABNORMAL HIGH (ref <5)

## 2021-10-30 LAB — CBG MONITORING, ED: Glucose-Capillary: 94 mg/dL (ref 70–99)

## 2021-10-30 MED ORDER — ACETAMINOPHEN 500 MG PO TABS
1000.0000 mg | ORAL_TABLET | Freq: Once | ORAL | Status: AC
  Administered 2021-10-30: 1000 mg via ORAL
  Filled 2021-10-30: qty 2

## 2021-10-30 MED ORDER — SODIUM CHLORIDE 0.9 % IV BOLUS
1000.0000 mL | Freq: Once | INTRAVENOUS | Status: AC
  Administered 2021-10-30: 1000 mL via INTRAVENOUS

## 2021-10-30 MED ORDER — METOCLOPRAMIDE HCL 5 MG/ML IJ SOLN
10.0000 mg | Freq: Once | INTRAMUSCULAR | Status: AC
  Administered 2021-10-30: 10 mg via INTRAVENOUS
  Filled 2021-10-30: qty 2

## 2021-10-30 MED ORDER — DIPHENHYDRAMINE HCL 50 MG/ML IJ SOLN
25.0000 mg | Freq: Once | INTRAMUSCULAR | Status: AC
  Administered 2021-10-30: 25 mg via INTRAVENOUS
  Filled 2021-10-30: qty 1

## 2021-10-30 NOTE — ED Provider Notes (Signed)
?[place] DEPT ?Provider Note ? ? ?CSN: [id number] ?Arrival date & time: 10/29/21  2225 ? ?  ? ?History ? ?Chief Complaint  ?Patient presents with  ? Headache  ? Dizziness  ? ? ?Kristin Bruce is a 26 y.o. female. ? ?Kristin Bruce is a 26 y.o. female currently [redacted] weeks pregnant, with a history of anxiety, depression and anemia, who presents to the emergency department for evaluation of headache and lightheadedness.  Patient reports she has been having intermittent headaches over the past few weeks she describes these as a throbbing pain that usually shows up on 1 side of her head.  She has tried taking Tylenol without much relief.  Usually does get some relief when sleeping but when she is up and moving around headaches return.  She reports she has 3 children at home and so is constantly moving.  Reports some light sensitivity but no visual changes, no nausea or vomiting.  Patient also reports feeling very lightheaded and tired.  No chest pain or shortness of breath.  She denies history of these headaches previously, OB/GYN notes has had migraines with prior pregnancy.  No associated numbness or weakness.  No syncopal episodes.  No room spinning dizzy sensation.  No fevers or chills, no neck pain.  No other aggravating relieving factors. ? ?The history is provided by the patient and medical records.  ? ?  ? ?Home Medications ?Prior to Admission medications   ?Medication Sig Start Date End Date Taking? Authorizing Provider  ?acetaminophen (TYLENOL) 500 MG tablet Take 1,000 mg by mouth every 6 (six) hours as needed for moderate pain.    [provider]  ?Blood Pressure Monitoring (BLOOD PRESSURE KIT) DEVI 1 Device by Does not apply route as needed. 10/30/20   Caren Macadam, MD  ?ferrous sulfate 325 (65 FE) MG tablet Take 1 tablet (325 mg total) by mouth every other day. ?Patient not taking: Reported on 10/14/2021 02/06/21 02/06/22  Nugent, Gerrie Nordmann, NP  ?hydrOXYzine  (ATARAX) 25 MG tablet Take 1 tablet (25 mg total) by mouth every 6 (six) hours. ?Patient taking differently: Take 25 mg by mouth daily as needed for anxiety or itching. 09/30/21   Tedd Sias, PA  ?potassium chloride SA (KLOR-CON M) 20 MEQ tablet Take 1 tablet (20 mEq total) by mouth daily for 10 days. 10/22/21 11/01/21  Fatima Blank, MD  ?Prenatal Vit-Fe Fumarate-FA (PREPLUS) 27-1 MG TABS Take 1 tablet by mouth daily. ?Patient not taking: Reported on 10/14/2021 03/03/21   Renard Matter, MD  ?   ? ?Allergies    ?Patient has no known allergies.   ? ?Review of Systems   ?Review of Systems  ?Constitutional:  Negative for chills and fever.  ?HENT: Negative.    ?Eyes:  Positive for photophobia. Negative for pain and visual disturbance.  ?Respiratory:  Negative for cough and shortness of breath.   ?Cardiovascular:  Negative for chest pain.  ?Gastrointestinal:  Negative for abdominal pain, nausea and vomiting.  ?Genitourinary:  Negative for vaginal bleeding and vaginal discharge.  ?Musculoskeletal:  Negative for arthralgias and myalgias.  ?Skin:  Negative for color change and rash.  ?Neurological:  Positive for headaches. Negative for dizziness, seizures, syncope, facial asymmetry, weakness, light-headedness and numbness.  ? ?Physical Exam ?Updated Vital Signs ?BP 104/72 (BP Location: Right Arm)   Pulse 84   Temp 98.2 ?F (36.8 ?C) (Oral)   Resp 16   Ht 5' (1.524 m)   Wt 49.9 kg  LMP 09/07/2021 (Approximate)   SpO2 96%   BMI 21.48 kg/m?  ?Physical Exam ?Vitals and nursing note reviewed.  ?Constitutional:   ?   General: She is not in acute distress. ?   Appearance: Normal appearance. She is well-developed and normal weight. She is not ill-appearing or diaphoretic.  ?HENT:  ?   Head: Normocephalic and atraumatic.  ?   Mouth/Throat:  ?   Mouth: Mucous membranes are moist.  ?   Pharynx: Oropharynx is clear.  ?Eyes:  ?   General:     ?   Right eye: No discharge.     ?   Left eye: No discharge.  ?   Extraocular  Movements: Extraocular movements intact.  ?   Pupils: Pupils are equal, round, and reactive to light.  ?Neck:  ?   Comments: No nuchal rigidity ?Cardiovascular:  ?   Rate and Rhythm: Normal rate and regular rhythm.  ?   Pulses: Normal pulses.  ?   Heart sounds: Normal heart sounds.  ?Pulmonary:  ?   Effort: Pulmonary effort is normal. No respiratory distress.  ?   Breath sounds: Normal breath sounds. No wheezing or rales.  ?   Comments: Respirations equal and unlabored, patient able to speak in full sentences, lungs clear to auscultation bilaterally  ?Abdominal:  ?   General: Bowel sounds are normal. There is no distension.  ?   Palpations: Abdomen is soft. There is no mass.  ?   Tenderness: There is no abdominal tenderness. There is no guarding.  ?   Comments: Abdomen soft, nondistended, nontender to palpation in all quadrants without guarding or peritoneal signs  ?Musculoskeletal:     ?   General: No deformity.  ?   Cervical back: Neck supple. No rigidity.  ?Skin: ?   General: Skin is warm and dry.  ?   Capillary Refill: Capillary refill takes less than 2 seconds.  ?Neurological:  ?   Mental Status: She is alert and oriented to person, place, and time.  ?   Coordination: Coordination normal.  ?   Comments: Speech is clear, able to follow commands ?CN III-XII intact ?Normal strength in upper and lower extremities bilaterally including dorsiflexion and plantar flexion, strong and equal grip strength ?Sensation normal to light and sharp touch ?Moves extremities without ataxia, coordination intact  ?Psychiatric:     ?   Mood and Affect: Mood normal.     ?   Behavior: Behavior normal.  ? ? ?ED Results / Procedures / Treatments   ?Labs ?(all labs ordered are listed, but only abnormal results are displayed) ?Labs Reviewed  ?BASIC METABOLIC PANEL - Abnormal; Notable for the following components:  ?    Result Value  ? Sodium 134 (*)   ? CO2 21 (*)   ? Anion gap 4 (*)   ? All other components within normal limits  ?CBC -  Abnormal; Notable for the following components:  ? RBC 3.65 (*)   ? Hemoglobin 10.2 (*)   ? HCT 31.5 (*)   ? All other components within normal limits  ?HCG, QUANTITATIVE, PREGNANCY - Abnormal; Notable for the following components:  ? hCG, Beta Neomia Dear 126,264 (*)   ? All other components within normal limits  ?DIFFERENTIAL - Abnormal; Notable for the following components:  ? Abs Immature Granulocytes 0.12 (*)   ? All other components within normal limits  ?URINALYSIS, ROUTINE W REFLEX MICROSCOPIC  ?CBG MONITORING, ED  ? ? ?EKG ?EKG Interpretation ? ?  Date/Time:  Thursday October 29 2021 23:49:20 EDT ?Ventricular Rate:  74 ?PR Interval:  137 ?QRS Duration: 130 ?QT Interval:  338 ?QTC Calculation: 375 ?R Axis:   48 ?Text Interpretation: Sinus rhythm Nonspecific intraventricular conduction delay Borderline ST elevation, lateral leads Confirmed by Nanda Quinton [phone]) on 10/29/2021 11:54:01 PM ? ?Radiology ?No results found. ? ?Procedures ?Procedures  ? ? ?Medications Ordered in ED ?Medications  ?sodium chloride 0.9 % bolus 1,000 mL (0 mLs Intravenous Stopped 10/30/21 0629)  ?metoCLOPramide (REGLAN) injection 10 mg (10 mg Intravenous Given 10/30/21 0502)  ?diphenhydrAMINE (BENADRYL) injection 25 mg (25 mg Intravenous Given 10/30/21 0502)  ?acetaminophen (TYLENOL) tablet 1,000 mg (1,000 mg Oral Given 10/30/21 0502)  ? ? ?ED Course/ Medical Decision Making/ A&P ?  ?                        ?This patient presents to the ED for concern of headache, lightheadedness, this involves an extensive number of treatment options, and is a complaint that carries with it a high risk of complications and morbidity.  The differential diagnosis includes migraine, tension headache, patient is currently in early pregnancy, but low suspicion for venous sinus thrombosis, no hypertension or concern for preeclampsia, no focal neurodeficits. ? ? ?Additional history obtained: ? ?Additional history obtained from chart review ?External records from  outside source obtained and reviewed including recent OB/GYN visits ? ? ?Lab Tests: ? ?I Ordered, and personally interpreted labs.  The pertinent results include: No leukocytosis, stable hemoglobin, no significant ele

## 2021-10-30 NOTE — Discharge Instructions (Addendum)
Your lab work evaluation today is overall reassuring.  Her neurologic exam is normal.  I suspect you may be having migraine headaches, it appears from your chart that you have had these previously during pregnancy.  Please follow-up with your OB/GYN regarding these headaches as well as the neurologist listed on your paperwork today. ?

## 2021-11-02 ENCOUNTER — Ambulatory Visit (HOSPITAL_COMMUNITY)
Admission: EM | Admit: 2021-11-02 | Discharge: 2021-11-02 | Disposition: A | Discharge status: Home / Self Care | Payer: Medicaid Other | Attending: Registered Nurse | Admitting: Registered Nurse

## 2021-11-02 ENCOUNTER — Encounter (HOSPITAL_COMMUNITY): Payer: Self-pay | Admitting: Registered Nurse

## 2021-11-02 DIAGNOSIS — F4323 Adjustment disorder with mixed anxiety and depressed mood: Secondary | ICD-10-CM | POA: Insufficient documentation

## 2021-11-02 DIAGNOSIS — F411 Generalized anxiety disorder: Secondary | ICD-10-CM | POA: Insufficient documentation

## 2021-11-02 HISTORY — DX: Generalized anxiety disorder: F41.1

## 2021-11-02 NOTE — Progress Notes (Signed)
?   11/02/21 1415  ?[facility] Triage Screening (Walk-ins at [facility] only)  ?How Did You Hear About [place]? Self  ?What Is the Reason for Your Visit/Call Today? 26 year old female [place] present complaining of having headaches which has triggered panic attacks.Report she's [redacted] weeks pregnant, this is her 5th pregnancy with 4 children, youngest child 71-months old. Report inability to sleep, lack of energy, dizziness and consistent headaches. Denied suicidal/homicidal ideations and denied auditory/visual hallucinations. Life stressors living in a hotel, scared to sleep for afraid not going to wake up, children/dealing with school issues related to her 26 year old.  ?How Long Has This Been Causing You Problems? 1 wk - 1 month  ?Have You Recently Had Any Thoughts About Hurting Yourself? No  ?Are You Planning to Commit Suicide/Harm Yourself At This time? No  ?Have you Recently Had Thoughts About Hurting Someone Karolee Ohs? No  ?Are You Planning To Harm Someone At This Time? No  ?Are you currently experiencing any auditory, visual or other hallucinations? No  ?Have You Used Any Alcohol or Drugs in the Past 24 Hours? No  ?Do you have any current medical co-morbidities that require immediate attention? No  ?Clinician description of patient physical appearance/behavior: Tearful during triage, calm and pleasant during with TTS  ?What Do You Feel Would Help You the Most Today? Stress Management;Medication(s)  ?If access to [facility] was not available, would you have sought care in the Emergency Department? No  ?Determination of Need Routine (7 days)  ?Options For Referral Medication Management;Outpatient Therapy  ? ? ?

## 2021-11-02 NOTE — ED Notes (Signed)
Pt discharged with  AVS.  AVS reviewed prior to discharge.  Pt alert, oriented, and ambulatory.  Safety maintained.  °

## 2021-11-02 NOTE — Discharge Instructions (Addendum)
Call your OB/GYN to see if can get earlier appointment than 11/19/2021 ?Follow up with Neurology referral for Headaches ?Go to the ED if your headaches worsen, chest pain, or shortness of breath ? ?Below is information to follow up with outpatient psychiatric services ? ? ? ?Walk in hours for medication management ?Monday, Wednesday, Thursday, and Friday from 8:00 AM to 11:00 AM ?Recommend arriving by by 7:30 AM.  It is first come first serve.   ? ?Walk in hours for therapy intake ?Monday and Wednesday only 8:00 AM to 11:00 AM Encouraged to arrive by 7:30 AM.  It is first come first serve  ?

## 2021-11-02 NOTE — ED Provider Notes (Signed)
[facility]al [facility] Urgent [facility] Medical Screening Exam ? ?Patient Name: Kristin Bruce ?MRN: [id number] ?Date of Evaluation: 11/02/21 ?Chief Complaint:   ?Diagnosis:  ?Final diagnoses:  ?Adjustment disorder with mixed anxiety and depressed mood  ?Anxiety state  ? ? ?History of Present illness: Kristin Bruce is a 25 y.o. female patient presented to [facility] as a walk in with complaints of worsening anxiety, panic attack, headaches, and not being able to sleep ? ?[name], 26 y.o., female patient seen face to face by this provider, consulted with Dr. Earlene Plater; and chart reviewed on 11/02/21.  On evaluation Kristin Bruce reports she has been to the emergency room several time related to headache.  "I was given fluids and told to take Benadryl and Acetaminophen for my headache.  They referred me to a neurologist but haven't had a chance to call yet.  I have an appointment with my Obstriction 10/20/2021."  Patient states that her headaches are more frequent and it makes her scared to sleep at night because feels that she may not wake up.  States that she has had several panic attacks.  Informed that she needed to call the neurologist to set up follow up and call OB to see if she can be seen earlier.  Discussed therapy to help work out issues and patient agreed.  ?During evaluation Kristin Bruce is sitting up in chair in no acute distress.  She is alert/oriented x 4; calm/cooperative; and mood congruent with affect.  She is speaking in a clear tone at moderate volume, and normal pace; with good eye contact.  Her thought process is coherent and relevant; There is no indication that she is currently responding to internal/external stimuli or experiencing delusional thought content; and she has denied suicidal/self-harm/homicidal ideation, psychosis, and paranoia.   ?Patient has remained calm throughout assessment and has answered questions appropriately.   ? ? ?Psychiatric Specialty Exam ? ?Presentation   ?General Appearance:Appropriate for Environment; Casual ? ?Eye Contact:Good ? ?Speech:Clear and Coherent; Normal Rate ? ?Speech Volume:Normal ? ?Handedness:Right ? ? ?Mood and Affect  ?Mood:Anxious ? ?Affect:Appropriate ? ? ?Thought Process  ?Thought Processes:Coherent; Goal Directed ? ?Descriptions of Associations:Intact ? ?Orientation:Full (Time, Place and Person) ? ?Thought Content:No data recorded   Hallucinations:None ? ?Ideas of Reference:None ? ?Suicidal Thoughts:No ? ?Homicidal Thoughts:No ? ? ?Sensorium  ?Memory:Immediate Good; Recent Good; Remote Good ? ?Judgment:Intact ? ?Insight:Present ? ? ?Executive Functions  ?Concentration:Good ? ?Attention Span:Good ? ?Recall:Good ? ?Fund of Knowledge:Good ? ?Language:Good ? ? ?Psychomotor Activity  ?Psychomotor Activity:Normal ? ? ?Assets  ?Assets:Communication Skills; Desire for Improvement; Social Support ? ? ?Sleep  ?Sleep:Fair ? ?Number of hours: No data recorded ? ?Nutritional Assessment (For OBS and [facility] admissions only) ?Has the patient had a weight loss or gain of 10 pounds or more in the last 3 months?: No ?Has the patient had a decrease in food intake/or appetite?: No ?Does the patient have dental problems?: No ?Does the patient have eating habits or behaviors that may be indicators of an eating disorder including binging or inducing vomiting?: No ?Has the patient recently lost weight without trying?: 0 ?Has the patient been eating poorly because of a decreased appetite?: 0 ?Malnutrition Screening Tool Score: 0 ? ? ? ?Physical Exam: ?Physical Exam ?Vitals and nursing note reviewed. Exam conducted with a chaperone present.  ?Constitutional:   ?   General: She is not in acute distress. ?   Appearance: Normal appearance. She is not ill-appearing.  ?Cardiovascular:  ?  Rate and Rhythm: Normal rate.  ?Pulmonary:  ?   Effort: Pulmonary effort is normal.  ?Musculoskeletal:     ?   General: Normal range of motion.  ?   Cervical back: Normal range of motion.   ?Skin: ?   General: Skin is warm and dry.  ?Neurological:  ?   Mental Status: She is alert and oriented to person, place, and time.  ?Psychiatric:     ?   Attention and Perception: Attention and perception normal. She does not perceive auditory or visual hallucinations.     ?   Mood and Affect: Affect normal. Mood is anxious.     ?   Speech: Speech normal.     ?   Behavior: Behavior normal. Behavior is cooperative.     ?   Thought Content: Thought content normal. Thought content is not paranoid or delusional. Thought content does not include homicidal or suicidal ideation.     ?   Cognition and Memory: Cognition and memory normal.     ?   Judgment: Judgment normal.  ? ?Review of Systems  ?Constitutional: Negative.   ?     Reports she is [redacted] weeks pregnant and youngest of 4 other children is 6 months.  Living in motel  ?HENT: Negative.    ?Eyes: Negative.   ?Respiratory: Negative.    ?Cardiovascular:  Positive for palpitations. Negative for leg swelling.  ?Gastrointestinal: Negative.   ?Genitourinary: Negative.   ?Musculoskeletal: Negative.   ?Skin: Negative.   ?Neurological:  Positive for dizziness and headaches.  ?Psychiatric/Behavioral:  Positive for depression. Negative for hallucinations, substance abuse and suicidal ideas. The patient is nervous/anxious and has insomnia.   ?Blood pressure 113/80, pulse 77, temperature 99.1 ?F (37.3 ?C), temperature source Oral, resp. rate 18, last menstrual period 09/07/2021, SpO2 100 %, unknown if currently breastfeeding. There is no height or weight on file to calculate BMI. ? ?Musculoskeletal: ?Strength & Muscle Tone: within normal limits ?Gait & Station: normal ?Patient leans: N/A ? ? ?[facility] Discharge Disposition for Follow up and Recommendations: ?Based on my evaluation the patient does not appear to have an emergency medical condition and can be discharged with resources and follow up care in outpatient services for Individual Therapy ? ? ? ?Discharge Instructions   ? ?   ?Call your OB/GYN to see if can get earlier appointment than 11/19/2021 ?Follow up with Neurology referral for Headaches ?Go to the ED if your headaches worsen, chest pain, or shortness of breath ? ?Below is information to follow up with outpatient psychiatric services ? ? ? ?Walk in hours for medication management ?Monday, Wednesday, Thursday, and Friday from 8:00 AM to 11:00 AM ?Recommend arriving by by 7:30 AM.  It is first come first serve.   ? ?Walk in hours for therapy intake ?Monday and Wednesday only 8:00 AM to 11:00 AM Encouraged to arrive by 7:30 AM.  It is first come first serve  ? ? ?  ? ?Cincere Deprey, NP ?11/02/2021, 4:49 PM ? ?

## 2021-11-03 ENCOUNTER — Emergency Department (HOSPITAL_COMMUNITY): Payer: Medicaid Other

## 2021-11-03 ENCOUNTER — Encounter (HOSPITAL_COMMUNITY): Payer: Self-pay | Admitting: Emergency Medicine

## 2021-11-03 ENCOUNTER — Other Ambulatory Visit: Payer: Self-pay

## 2021-11-03 ENCOUNTER — Emergency Department (HOSPITAL_COMMUNITY)
Admission: EM | Admit: 2021-11-03 | Discharge: 2021-11-03 | Disposition: A | Discharge status: Home / Self Care | Payer: Medicaid Other | Attending: Emergency Medicine | Admitting: Emergency Medicine

## 2021-11-03 DIAGNOSIS — R079 Chest pain, unspecified: Secondary | ICD-10-CM | POA: Insufficient documentation

## 2021-11-03 DIAGNOSIS — R42 Dizziness and giddiness: Secondary | ICD-10-CM | POA: Diagnosis not present

## 2021-11-03 DIAGNOSIS — Z3A08 8 weeks gestation of pregnancy: Secondary | ICD-10-CM | POA: Insufficient documentation

## 2021-11-03 DIAGNOSIS — R519 Headache, unspecified: Secondary | ICD-10-CM | POA: Insufficient documentation

## 2021-11-03 DIAGNOSIS — O26891 Other specified pregnancy related conditions, first trimester: Secondary | ICD-10-CM | POA: Insufficient documentation

## 2021-11-03 LAB — CBC WITH DIFFERENTIAL/PLATELET
Abs Immature Granulocytes: 0.03 10*3/uL (ref 0.00–0.07)
Basophils Absolute: 0 10*3/uL (ref 0.0–0.1)
Basophils Relative: 0 %
Eosinophils Absolute: 0 10*3/uL (ref 0.0–0.5)
Eosinophils Relative: 1 %
HCT: 33.7 % — ABNORMAL LOW (ref 36.0–46.0)
Hemoglobin: 10.9 g/dL — ABNORMAL LOW (ref 12.0–15.0)
Immature Granulocytes: 0 %
Lymphocytes Relative: 20 %
Lymphs Abs: 1.7 10*3/uL (ref 0.7–4.0)
MCH: 27.9 pg (ref 26.0–34.0)
MCHC: 32.3 g/dL (ref 30.0–36.0)
MCV: 86.4 fL (ref 80.0–100.0)
Monocytes Absolute: 0.6 10*3/uL (ref 0.1–1.0)
Monocytes Relative: 7 %
Neutro Abs: 6 10*3/uL (ref 1.7–7.7)
Neutrophils Relative %: 72 %
Platelets: 337 10*3/uL (ref 150–400)
RBC: 3.9 MIL/uL (ref 3.87–5.11)
RDW: 14.6 % (ref 11.5–15.5)
WBC: 8.3 10*3/uL (ref 4.0–10.5)
nRBC: 0 % (ref 0.0–0.2)

## 2021-11-03 LAB — BASIC METABOLIC PANEL
Anion gap: 5 (ref 5–15)
BUN: 17 mg/dL (ref 6–20)
CO2: 22 mmol/L (ref 22–32)
Calcium: 8.9 mg/dL (ref 8.9–10.3)
Chloride: 108 mmol/L (ref 98–111)
Creatinine, Ser: 0.52 mg/dL (ref 0.44–1.00)
GFR, Estimated: >60 mL/min (ref >60)
Glucose, Bld: 78 mg/dL (ref 70–99)
Potassium: 3.5 mmol/L (ref 3.5–5.1)
Sodium: 135 mmol/L (ref 135–145)

## 2021-11-03 LAB — MAGNESIUM: Magnesium: 1.8 mg/dL (ref 1.7–2.4)

## 2021-11-03 MED ORDER — DOXYLAMINE-PYRIDOXINE 10-10 MG PO TBEC
1.0000 | DELAYED_RELEASE_TABLET | Freq: Every day | ORAL | 0 refills | Status: DC
Start: 2021-11-03 — End: 2022-01-18

## 2021-11-03 MED ORDER — METOCLOPRAMIDE HCL 10 MG PO TABS: 10.0000 mg | ORAL_TABLET | Freq: Four times a day (QID) | ORAL | 0 refills | Status: DC

## 2021-11-03 MED ORDER — METOCLOPRAMIDE HCL 5 MG/ML IJ SOLN
10.0000 mg | Freq: Once | INTRAMUSCULAR | Status: AC
  Administered 2021-11-03: 10 mg via INTRAVENOUS
  Filled 2021-11-03: qty 2

## 2021-11-03 MED ORDER — DIPHENHYDRAMINE HCL 50 MG/ML IJ SOLN
25.0000 mg | Freq: Once | INTRAMUSCULAR | Status: AC
  Administered 2021-11-03: 25 mg via INTRAVENOUS
  Filled 2021-11-03: qty 1

## 2021-11-03 MED ORDER — SODIUM CHLORIDE 0.9 % IV BOLUS
1000.0000 mL | Freq: Once | INTRAVENOUS | Status: AC
Start: 2021-11-03 — End: 2021-11-03
  Administered 2021-11-03: 1000 mL via INTRAVENOUS

## 2021-11-03 NOTE — Discharge Instructions (Addendum)
Eat and drink as well as you can.  Follow up with OB in the office.  ? ?Your CT was normal.   ? ? ?

## 2021-11-03 NOTE — ED Provider Triage Note (Signed)
Emergency Medicine Provider Triage Evaluation Note ? ?Kristin Bruce , a 26 y.o. female  was evaluated in triage.  Pt complains of anxiety, chest pains, headache, insomnia for the last 3 weeks.  Patient reports that she is currently living in a hotel with 3 children, recently found out she was pregnant with her fourth.  Patient states that she is anxious about her dying and her children being left out of mother.  Patient was seen at behavioral health urgent care day prior, had reassuring work-up and discharged with resources.  Patient states that she is not able to get antianxiety medication due to being pregnant. ? ?Review of Systems  ?Positive:  ?Negative:  ? ?Physical Exam  ?BP (!) 111/95   Pulse (!) 112   Temp 98 ?F (36.7 ?C) (Oral)   Resp 20   Ht 5' (1.524 m)   Wt 52.2 kg   LMP 09/07/2021 (Approximate)   SpO2 99%   BMI 22.46 kg/m?  ?Gen:   Awake, no distress   ?Resp:  Normal effort  ?MSK:   Moves extremities without difficulty  ?Other:   ? ?Medical Decision Making  ?Medically screening exam initiated at 6:40 PM.  Appropriate orders placed.  Kristin Bruce was informed that the remainder of the evaluation will be completed by another provider, this initial triage assessment does not replace that evaluation, and the importance of remaining in the ED until their evaluation is complete. ? ? ?  ?Al Decant, PA-C ?11/03/21 1841 ? ?

## 2021-11-03 NOTE — ED Provider Notes (Signed)
?[facility] DEPT ?Provider Note ? ? ?CSN: [id number] ?Arrival date & time: 11/03/21  Kristin Bruce ? ?  ? ?History ? ?Chief Complaint  ?Patient presents with  ? Chest Pain  ? Dizziness  ? Migraine  ? ? ?Kristin Bruce is a 26 y.o. female. ? ?26 yo F with a chief complaints of insomnia anxiety headache and dizziness.  This been going on for a few weeks now.  She tells me that she is able to sleep okay during the day but when she tries to sleep at night she gets panicked and wakes up frequently.  She denies any head injury denies one-sided numbness or weakness denies difficulty speech or swallowing.  She is newly pregnant and this is about [redacted] weeks along.  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  She has seen OB multiple times for this.  Been referred to mental health for evaluation and saw them yesterday.  She feels like something is wrong and feels like she needs an image of her head prove that nothing is wrong. ? ? ?Chest Pain ?Associated symptoms: dizziness   ?Dizziness ?Associated symptoms: chest pain   ?Migraine ?Associated symptoms include chest pain.  ? ?  ? ?Home Medications ?Prior to Admission medications   ?Medication Sig Start Date End Date Taking? Authorizing Provider  ?Doxylamine-Pyridoxine 10-10 MG TBEC Take 1 each by mouth at bedtime. If you continue to have nausea take another in the morning 11/03/21  Yes Deno Etienne, DO  ?metoCLOPramide (REGLAN) 10 MG tablet Take 1 tablet (10 mg total) by mouth every 6 (six) hours. 11/03/21  Yes Deno Etienne, DO  ?acetaminophen (TYLENOL) 500 MG tablet Take 1,000 mg by mouth every 6 (six) hours as needed for moderate pain.    [provider]  ?Blood Pressure Monitoring (BLOOD PRESSURE KIT) DEVI 1 Device by Does not apply route as needed. 10/30/20   Caren Macadam, MD  ?ferrous sulfate 325 (65 FE) MG tablet Take 1 tablet (325 mg total) by mouth every other day. ?Patient not taking: Reported on 10/14/2021 02/06/21 02/06/22  Nugent, Gerrie Nordmann,  NP  ?hydrOXYzine (ATARAX) 25 MG tablet Take 1 tablet (25 mg total) by mouth every 6 (six) hours. ?Patient taking differently: Take 25 mg by mouth daily as needed for anxiety or itching. 09/30/21   Tedd Sias, PA  ?potassium chloride SA (KLOR-CON M) 20 MEQ tablet Take 1 tablet (20 mEq total) by mouth daily for 10 days. 10/22/21 11/01/21  Fatima Blank, MD  ?Prenatal Vit-Fe Fumarate-FA (PREPLUS) 27-1 MG TABS Take 1 tablet by mouth daily. ?Patient not taking: Reported on 10/14/2021 03/03/21   Renard Matter, MD  ?   ? ?Allergies    ?Patient has no known allergies.   ? ?Review of Systems   ?Review of Systems  ?Cardiovascular:  Positive for chest pain.  ?Neurological:  Positive for dizziness.  ? ?Physical Exam ?Updated Vital Signs ?BP 94/64 (BP Location: Left Arm)   Pulse 88   Temp 98.1 ?F (36.7 ?C) (Oral)   Resp 20   Ht 5' (1.524 m)   Wt 52.2 kg   LMP 09/07/2021 (Approximate)   SpO2 98%   BMI 22.46 kg/m?  ?Physical Exam ?Vitals and nursing note reviewed.  ?Constitutional:   ?   General: She is not in acute distress. ?   Appearance: She is well-developed. She is not diaphoretic.  ?HENT:  ?   Head: Normocephalic and atraumatic.  ?Eyes:  ?   Pupils: Pupils  are equal, round, and reactive to light.  ?Cardiovascular:  ?   Rate and Rhythm: Normal rate and regular rhythm.  ?   Heart sounds: No murmur heard. ?  No friction rub. No gallop.  ?Pulmonary:  ?   Effort: Pulmonary effort is normal.  ?   Breath sounds: No wheezing or rales.  ?Abdominal:  ?   General: There is no distension.  ?   Palpations: Abdomen is soft.  ?   Tenderness: There is no abdominal tenderness.  ?Musculoskeletal:     ?   General: No tenderness.  ?   Cervical back: Normal range of motion and neck supple.  ?Skin: ?   General: Skin is warm and dry.  ?Neurological:  ?   Mental Status: She is alert and oriented to person, place, and time.  ?   Cranial Nerves: Cranial nerves 2-12 are intact.  ?   Sensory: Sensation is intact.  ?   Motor: Motor  function is intact.  ?   Coordination: Coordination is intact.  ?Psychiatric:     ?   Behavior: Behavior normal.  ? ? ?ED Results / Procedures / Treatments   ?Labs ?(all labs ordered are listed, but only abnormal results are displayed) ?Labs Reviewed  ?CBC WITH DIFFERENTIAL/PLATELET - Abnormal; Notable for the following components:  ?    Result Value  ? Hemoglobin 10.9 (*)   ? HCT 33.7 (*)   ? All other components within normal limits  ?BASIC METABOLIC PANEL  ?MAGNESIUM  ? ? ?EKG ?EKG Interpretation ? ?Date/Time:  Tuesday November 03 2021 18:35:22 EDT ?Ventricular Rate:  111 ?PR Interval:  113 ?QRS Duration: 75 ?QT Interval:  311 ?QTC Calculation: 423 ?R Axis:   56 ?Text Interpretation: Sinus tachycardia Borderline T abnormalities, inferior leads No significant change since last tracing Confirmed by Deno Etienne [phone]) on 11/03/2021 8:43:09 PM ? ?Radiology ?CT Head Wo Contrast ? ?Result Date: 11/03/2021 ?CLINICAL DATA:  Headache, new or worsening (Age >= 50y) EXAM: CT HEAD WITHOUT CONTRAST TECHNIQUE: Contiguous axial images were obtained from the base of the skull through the vertex without intravenous contrast. RADIATION DOSE REDUCTION: This exam was performed according to the departmental dose-optimization program which includes automated exposure control, adjustment of the mA and/or kV according to patient size and/or use of iterative reconstruction technique. COMPARISON:  None. FINDINGS: Brain: No acute intracranial abnormality. Specifically, no hemorrhage, hydrocephalus, mass lesion, acute infarction, or significant intracranial injury. Vascular: No hyperdense vessel or unexpected calcification. Skull: No acute calvarial abnormality. Sinuses/Orbits: No acute findings Other: None IMPRESSION: No acute intracranial abnormality. Electronically Signed   By: Rolm Baptise M.D.   On: 11/03/2021 21:16   ? ?Procedures ?Procedures  ? ? ?Medications Ordered in ED ?Medications  ?metoCLOPramide (REGLAN) injection 10 mg (10 mg  Intravenous Given 11/03/21 2133)  ?diphenhydrAMINE (BENADRYL) injection 25 mg (25 mg Intravenous Given 11/03/21 2133)  ?sodium chloride 0.9 % bolus 1,000 mL (1,000 mLs Intravenous New Bag/Given 11/03/21 2132)  ? ? ?ED Course/ Medical Decision Making/ A&P ?  ?                        ?Medical Decision Making ?Amount and/or Complexity of Data Reviewed ?Labs: ordered. ?Radiology: ordered. ? ?Risk ?Prescription drug management. ? ? ?26 yo F with a chief complaints of dizziness and headache.  This been going on for a few weeks now.  By history most likely the patient is dehydrated and not drinking enough being newly  pregnant since his surgery experience headaches due to that.  She has a benign neurologic exam.  She is describing worsening progressive headaches and is fairly tachycardic on arrival.  We will obtain a CT of the head.  Blood work.  Headache cocktail.  Reassess. ? ?CT head negative.   ? ?No significant anemia, no significant electrolyte abnormality.  ? ?10:49 PM:  I have discussed the diagnosis/risks/treatment options with the patient.  Evaluation and diagnostic testing in the emergency department does not suggest an emergent condition requiring admission or immediate intervention beyond what has been performed at this time.  They will follow up with  PCP. We also discussed returning to the ED immediately if new or worsening sx occur. We discussed the sx which are most concerning (e.g., sudden worsening pain, fever, inability to tolerate by mouth) that necessitate immediate return. Medications administered to the patient during their visit and any new prescriptions provided to the patient are listed below. ? ?Medications given during this visit ?Medications  ?metoCLOPramide (REGLAN) injection 10 mg (10 mg Intravenous Given 11/03/21 2133)  ?diphenhydrAMINE (BENADRYL) injection 25 mg (25 mg Intravenous Given 11/03/21 2133)  ?sodium chloride 0.9 % bolus 1,000 mL (1,000 mLs Intravenous New Bag/Given 11/03/21 2132)   ? ? ? ?The patient appears reasonably screen and/or stabilized for discharge and I doubt any other medical condition or other [facility] requiring further screening, evaluation, or treatment in the ED at this time prior to disch

## 2021-11-03 NOTE — ED Triage Notes (Signed)
Pt reports that she has been having nonstop headaches and chest pain related to anxiety attacks. Pt reports that she is [redacted] weeks pregnant and unable to see her OB until May. ?

## 2021-11-11 ENCOUNTER — Telehealth: Payer: Self-pay | Admitting: Family Medicine

## 2021-11-11 NOTE — Telephone Encounter (Signed)
Called patient, no answer- unable to leave message as voicemail was full. ?

## 2021-11-11 NOTE — Telephone Encounter (Signed)
Patient called because she has an intake scheduled for 11/19/21 but she is experiencing very bad seasonal allergies and severe panic attacks where she can not sleep at night. She called [facility] they told her to go to the ER because it was not related to the baby. The ER said that they could not give her anything because she is currently pregnant. She also visited the behavioral health center across the street from [place] and they told her to go to her OBGYN for an emergency appointment. Patient states she has been trying to wait for her intake and deal with the problem herself but it is getting to the point where she needs help and she feels like no one is willing to help her. ?

## 2021-11-13 ENCOUNTER — Encounter: Payer: Self-pay | Admitting: *Deleted

## 2021-11-16 ENCOUNTER — Telehealth: Payer: Self-pay | Admitting: Family Medicine

## 2021-11-16 NOTE — Telephone Encounter (Signed)
Called patient to inform her of her moved appointment date and time, there was no answer to the phone call and the option to leave a voicemail was unavailable. A mychart was sent to the patient.  ?

## 2021-11-17 ENCOUNTER — Encounter (HOSPITAL_COMMUNITY): Payer: Self-pay | Admitting: Obstetrics and Gynecology

## 2021-11-17 ENCOUNTER — Inpatient Hospital Stay (HOSPITAL_COMMUNITY): Payer: Medicaid Other

## 2021-11-17 ENCOUNTER — Other Ambulatory Visit: Payer: Self-pay

## 2021-11-17 ENCOUNTER — Inpatient Hospital Stay (HOSPITAL_COMMUNITY)
Admission: AD | Admit: 2021-11-17 | Discharge: 2021-11-17 | Disposition: A | Discharge status: Home / Self Care | Payer: Medicaid Other | Attending: Obstetrics and Gynecology | Admitting: Obstetrics and Gynecology

## 2021-11-17 DIAGNOSIS — O26899 Other specified pregnancy related conditions, unspecified trimester: Secondary | ICD-10-CM

## 2021-11-17 DIAGNOSIS — Z79899 Other long term (current) drug therapy: Secondary | ICD-10-CM | POA: Diagnosis not present

## 2021-11-17 DIAGNOSIS — R109 Unspecified abdominal pain: Secondary | ICD-10-CM | POA: Diagnosis present

## 2021-11-17 DIAGNOSIS — O26891 Other specified pregnancy related conditions, first trimester: Secondary | ICD-10-CM | POA: Diagnosis not present

## 2021-11-17 DIAGNOSIS — R519 Headache, unspecified: Secondary | ICD-10-CM | POA: Diagnosis not present

## 2021-11-17 DIAGNOSIS — Z3A10 10 weeks gestation of pregnancy: Secondary | ICD-10-CM | POA: Insufficient documentation

## 2021-11-17 LAB — URINALYSIS, ROUTINE W REFLEX MICROSCOPIC
Bilirubin Urine: NEGATIVE
Glucose, UA: NEGATIVE mg/dL
Hgb urine dipstick: NEGATIVE
Ketones, ur: 5 mg/dL — AB
Leukocytes,Ua: NEGATIVE
Nitrite: NEGATIVE
Protein, ur: NEGATIVE mg/dL
Specific Gravity, Urine: 1.031 — ABNORMAL HIGH (ref 1.005–1.030)
pH: 5 (ref 5.0–8.0)

## 2021-11-17 MED ORDER — BUTALBITAL-APAP-CAFFEINE 50-325-40 MG PO TABS: 1.0000 | ORAL_TABLET | Freq: Four times a day (QID) | ORAL | 0 refills | Status: DC | PRN

## 2021-11-17 MED ORDER — FLUTICASONE PROPIONATE 50 MCG/ACT NA SUSP
2.0000 | Freq: Once | NASAL | Status: DC
  Filled 2021-11-17 (×2): qty 16

## 2021-11-17 MED ORDER — BUTALBITAL-APAP-CAFFEINE 50-325-40 MG PO TABS
1.0000 | ORAL_TABLET | Freq: Once | ORAL | Status: AC
Start: 2021-11-17 — End: 2021-11-17
  Administered 2021-11-17: 1 via ORAL
  Filled 2021-11-17: qty 1

## 2021-11-17 MED ORDER — FLUTICASONE PROPIONATE 50 MCG/ACT NA SUSP: 2.0000 | Freq: Once | NASAL | 2 refills | Status: DC

## 2021-11-17 NOTE — [facility] Note (Signed)
.  Kristin Bruce is a 26 y.o. at [redacted]w[redacted]d here in [facility] reporting headaches, fatigue, and abdominal cramps. States has chest pain off and on for a month but has had EKG and everything ok. States she is not worried about that she comes in for h/a, fatigue, and abdominal pain. Abdominal pain for 2 wks. Tylenol not helping headaches. Denies VB. Some clear vag dc ?LMP: [facility] 06/14/22 ?Onset of complaint: going on for wks ?Pain score: 8 for headache and cramps ?Vitals:  ? 11/17/21 1939 11/17/21 1941  ?BP:  110/68  ?Pulse: 90   ?Resp: 17   ?Temp: 98.9 ?F (37.2 ?C)   ?SpO2: 100%   ?   ?FHT:n/a ?Lab orders placed from triage:  u/a ? ?

## 2021-11-17 NOTE — MAU Provider Note (Signed)
Chief Complaint: Headache, Abdominal Pain, Fatigue (E/), and Chest Pain ? ? Event Date/Time  ? First Provider Initiated Contact with Patient 11/17/21 2041   ?  ?   ?SUBJECTIVE ?HPI: Kristin Bruce is a 26 y.o. [phone] at 42w1dby LMP who presents to maternity admissions reporting headache unrelieved by Tylenol, abdominal cramping, and fatigue.  States has had chest pain worked up in ED and is not worried about that.  Does not want to take Benadryl as it makes her sleepy.; Did not fill Rx for Reglan or Diclegis. .Marland Kitchen?She denies vaginal bleeding, vaginal itching/burning, urinary symptoms, dizziness, n/v, or fever/chills.   ? ?Headache  ?This is a recurrent problem. The current episode started today. The problem has been unchanged. The pain is located in the Bilateral region. The pain does not radiate. The quality of the pain is described as aching. Associated symptoms include abdominal pain. Pertinent negatives include no muscle aches or photophobia. Nothing aggravates the symptoms. She has tried acetaminophen for the symptoms. The treatment provided no relief.  ?Abdominal Pain ?This is a recurrent problem. The problem occurs intermittently. The problem has been unchanged. Associated symptoms include constipation. Pertinent negatives include no diarrhea or frequency. Nothing aggravates the pain. The pain is relieved by Nothing. She has tried nothing for the symptoms.  ?RN Note: ?Kristin GIAIMOis a 26y.o. at 137w1dere in MAU reporting headaches, fatigue, and abdominal cramps. States has chest pain off and on for a month but has had EKG and everything ok. States she is not worried about that she comes in for h/a, fatigue, and abdominal pain. Abdominal pain for 2 wks. Tylenol not helping headaches. Denies VB. Some clear vag dc ?[facility] ?Onset of complaint: going on for wks ?Pain score: 8 for headache and cramps ? ?Past Medical History:  ?Diagnosis Date  ? Anemia   ? Anxiety   ? Depression   ? HSV (herpes  simplex virus) anogenital infection   ? Suicidal thoughts   ? during 1st pregnancy  ? Wears glasses   ? ?Past Surgical History:  ?Procedure Laterality Date  ? INCISION AND DRAINAGE ABSCESS N/A 05/13/2018  ? Procedure: INCISION AND DRAINAGE, PILONIDAL  ABSCESS;  Surgeon: WyJudeth HornMD;  [facility]: [facility];  Laterality: N/A;  ? INAlder/A 04/01/2018  ? Procedure: IRRIGATION AND DEBRIDEMENT PERIRECTAL ABSCESS;  Surgeon: ThGeorganna SkeansMD;  [facility]: General;  Laterality: N/A;  ? PILONIDAL CYST EXCISION N/A 07/20/2018  ? Procedure: EXCISION OF PILONIDAL DISEASE;  Surgeon: WhIleana RoupMD;  [facility];  Laterality: N/A;  ? ?Social History  ? ?Socioeconomic History  ? Marital status: Single  ?  Spouse name: Not on file  ? Number of children: 2  ? Years of education: Not on file  ? Highest education level: Not on file  ?Occupational History  ? Not on file  ?Tobacco Use  ? Smoking status: Never  ? Smokeless tobacco: Never  ?Vaping Use  ? Vaping Use: Never used  ?Substance and Sexual Activity  ? Alcohol use: Not Currently  ?  Comment: not since pregnancy  ? Drug use: Not Currently  ?  Types: Marijuana  ?  Comment: no using for 2 months  ? Sexual activity: Yes  ?Other Topics Concern  ? Not on file  ?Social History Narrative  ? Not on file  ? ?Social Determinants of Health  ? ?  Financial Resource Strain: Not on file  ?Food Insecurity: No Food Insecurity  ? Worried About Charity fundraiser in the Last Year: Never true  ? Ran Out of Food in the Last Year: Never true  ?Transportation Needs: Unmet Transportation Needs  ? Lack of Transportation (Medical): Yes  ? Lack of Transportation (Non-Medical): Yes  ?Physical Activity: Not on file  ?Stress: Not on file  ?Social Connections: Not on file  ?Intimate Partner Violence: Not on file  ? ?No current facility-administered medications on file prior to encounter.   ? ?Current Outpatient Medications on File Prior to Encounter  ?Medication Sig Dispense Refill  ? acetaminophen (TYLENOL) 500 MG tablet Take 1,000 mg by mouth every 6 (six) hours as needed for moderate pain.    ? Blood Pressure Monitoring (BLOOD PRESSURE KIT) DEVI 1 Device by Does not apply route as needed. 1 each 0  ? Doxylamine-Pyridoxine 10-10 MG TBEC Take 1 each by mouth at bedtime. If you continue to have nausea take another in the morning 60 tablet 0  ? ferrous sulfate 325 (65 FE) MG tablet Take 1 tablet (325 mg total) by mouth every other day. (Patient not taking: Reported on 10/14/2021) 30 tablet 3  ? hydrOXYzine (ATARAX) 25 MG tablet Take 1 tablet (25 mg total) by mouth every 6 (six) hours. (Patient taking differently: Take 25 mg by mouth daily as needed for anxiety or itching.) 21 tablet 0  ? metoCLOPramide (REGLAN) 10 MG tablet Take 1 tablet (10 mg total) by mouth every 6 (six) hours. 30 tablet 0  ? potassium chloride SA (KLOR-CON M) 20 MEQ tablet Take 1 tablet (20 mEq total) by mouth daily for 10 days. 10 tablet 0  ? Prenatal Vit-Fe Fumarate-FA (PREPLUS) 27-1 MG TABS Take 1 tablet by mouth daily. (Patient not taking: Reported on 10/14/2021) 90 tablet 2  ? ?No Known Allergies ? ?I have reviewed patient's Past Medical Hx, Surgical Hx, Family Hx, Social Hx, medications and allergies.  ? ?ROS:  ?Review of Systems  ?Eyes:  Negative for photophobia.  ?Gastrointestinal:  Positive for abdominal pain and constipation. Negative for diarrhea.  ?Genitourinary:  Negative for frequency.  ?Review of Systems  ?Other systems negative ? ? ?Physical Exam  ?Physical Exam ?Patient Vitals for the past 24 hrs: ? BP Temp Pulse Resp SpO2 Height Weight  ?11/17/21 1941 110/68 -- -- -- -- -- --  ?11/17/21 1939 -- 98.9 ?F (37.2 ?C) 90 17 100 % 5' (1.524 m) 49 kg  ? ?Constitutional: Well-developed, well-nourished female in no acute distress.  ?Cardiovascular: normal rate ?Respiratory: normal effort ?GI: Abd soft, non-tender. Pos BS x  4 ?MS: Extremities nontender, no edema, normal ROM ?Neurologic: Alert and oriented x 4.  ?GU: Neg CVAT. ? ?LAB RESULTS ?Results for orders placed or performed during the hospital encounter of 11/17/21 (from the past 24 hour(s))  ?Urinalysis, Routine w reflex microscopic Urine, Clean Catch     Status: Abnormal  ? Collection Time: 11/17/21  7:50 PM  ?Result Value Ref Range  ? Color, Urine YELLOW YELLOW  ? APPearance HAZY (A) CLEAR  ? Specific Gravity, Urine 1.031 (H) 1.005 - 1.030  ? pH 5.0 5.0 - 8.0  ? Glucose, UA NEGATIVE NEGATIVE mg/dL  ? Hgb urine dipstick NEGATIVE NEGATIVE  ? Bilirubin Urine NEGATIVE NEGATIVE  ? Ketones, ur 5 (A) NEGATIVE mg/dL  ? Protein, ur NEGATIVE NEGATIVE mg/dL  ? Nitrite NEGATIVE NEGATIVE  ? Leukocytes,Ua NEGATIVE NEGATIVE  ? ? ? ?--/--/O POS (10/12 0600) ? ?  IMAGING ?US OB Transvaginal ? ?Result Date: 11/17/2021 ?CLINICAL DATA:  Abdominal cramps x2 weeks. EXAM: TRANSVAGINAL OB ULTRASOUND TECHNIQUE: Transvaginal ultrasound was performed for complete evaluation of the gestation as well as the maternal uterus, adnexal regions, and pelvic cul-de-sac. COMPARISON:  October 17, 2021 FINDINGS: Intrauterine gestational sac: Single Yolk sac:  Visualized. Embryo:  Visualized. Cardiac Activity: Visualized. Heart Rate: 154 bpm CRL:   34.4 mm   10 w 2 d                  [place] [facility]: June 13, 2022 Subchorionic hemorrhage:  None visualized. Maternal uterus/adnexae: The bilateral ovaries are visualized and are normal in appearance. No pelvic free fluid is seen. IMPRESSION: Single, viable intrauterine pregnancy at approximately 10 weeks and 2 days gestation by ultrasound evaluation. Electronically Signed   By: Virgina Norfolk M.D.   On: 11/17/2021 21:23   ? ?MAU Management/MDM: ?Ordered followup Ultrasound to rule out ectopic. ?US revealed a live, single fetus in uterus, HR 154 ?Patient is very pleased ?Treatments in MAU included Fioricet x 1, and Flonase ?These treatments totally relieved her headache..   ? ?This pain can represent a normal pregnancy with bleeding, spontaneous abortion or even an ectopic which can be life-threatening.  The process as listed above helps to determine which of these is present. ? ?

## 2021-11-18 ENCOUNTER — Encounter: Payer: Self-pay | Admitting: Obstetrics and Gynecology

## 2021-11-18 ENCOUNTER — Telehealth (INDEPENDENT_AMBULATORY_CARE_PROVIDER_SITE_OTHER): Payer: Medicaid Other | Admitting: *Deleted

## 2021-11-18 DIAGNOSIS — O0940 Supervision of pregnancy with grand multiparity, unspecified trimester: Secondary | ICD-10-CM

## 2021-11-18 DIAGNOSIS — D563 Thalassemia minor: Secondary | ICD-10-CM

## 2021-11-18 DIAGNOSIS — Z8759 Personal history of other complications of pregnancy, childbirth and the puerperium: Secondary | ICD-10-CM

## 2021-11-18 DIAGNOSIS — R45851 Suicidal ideations: Secondary | ICD-10-CM

## 2021-11-18 DIAGNOSIS — O09219 Supervision of pregnancy with history of pre-term labor, unspecified trimester: Secondary | ICD-10-CM

## 2021-11-18 DIAGNOSIS — O0990 Supervision of high risk pregnancy, unspecified, unspecified trimester: Secondary | ICD-10-CM

## 2021-11-18 DIAGNOSIS — O09899 Supervision of other high risk pregnancies, unspecified trimester: Secondary | ICD-10-CM

## 2021-11-18 DIAGNOSIS — B009 Herpesviral infection, unspecified: Secondary | ICD-10-CM

## 2021-11-18 DIAGNOSIS — F411 Generalized anxiety disorder: Secondary | ICD-10-CM

## 2021-11-18 DIAGNOSIS — F4323 Adjustment disorder with mixed anxiety and depressed mood: Secondary | ICD-10-CM

## 2021-11-18 DIAGNOSIS — F4321 Adjustment disorder with depressed mood: Secondary | ICD-10-CM

## 2021-11-18 DIAGNOSIS — Z3A00 Weeks of gestation of pregnancy not specified: Secondary | ICD-10-CM

## 2021-11-18 HISTORY — DX: Supervision of other high risk pregnancies, unspecified trimester: O09.899

## 2021-11-18 HISTORY — DX: Supervision of high risk pregnancy, unspecified, unspecified trimester: O09.90

## 2021-11-18 MED ORDER — PRENATAL VITAMIN 27-0.8 MG PO TABS: 1.0000 | ORAL_TABLET | ORAL | 11 refills | Status: DC | PRN

## 2021-11-18 MED ORDER — GOJJI WEIGHT SCALE MISC: 1.0000 | 0 refills | Status: DC | PRN

## 2021-11-18 NOTE — Patient Instructions (Signed)
?  At our [facility], we work as an integrated team, providing care to address both physical and emotional health. Your medical provider may refer you to see our Behavioral Health Clinician (BHC) on the same day you see your medical provider, as availability permits; often scheduled virtually at your convenience.  ?Our BHC is available to all patients, visits generally last between 20-30 minutes, but can be longer or shorter, depending on patient need. The BHC offers help with stress management, coping with symptoms of depression and anxiety, major life changes , sleep issues, changing risky behavior, grief and loss, life stress, working on personal life goals, and  behavioral health issues, as these all affect your overall health and wellness.  ?The BHC is NOT available for the following: FMLA paperwork, court-ordered evaluations, specialty assessments (custody or disability), letters to employers, or obtaining certification for an emotional support animal. The BHC does not provide long-term therapy. ?You have the right to refuse integrated behavioral health services, or to reschedule to see the BHC at a later date.  ?Confidentiality exception: If it is suspected that a child or disabled adult is being abused or neglected, we are required by law to report that to either Child Protective Services or Adult Protective Services.  ?If you have a diagnosis of Bipolar affective disorder, Schizophrenia, or recurrent Major depressive disorder, we will recommend that you establish care with a psychiatrist, as these are lifelong, chronic conditions, and we want your overall emotional health and medications to be more closely monitored. If you anticipate needing extended maternity leave due to mental health issues postpartum, it it recommended you inform your medical provider, so we can put in a referral to a psychiatrist as soon as possible. The BHC is unable to recommend an extended maternity leave for mental  health issues. ?Your medical provider or BHC may refer you to a therapist for ongoing, traditional therapy, or to a psychiatrist, for medication management, if it would benefit your overall health. ?Depending on your insurance, you may have a copay or be charged a deductible, depending on your insurance, to see the BHC. If you are uninsured, it is recommended that you apply for financial assistance. (Forms may be requested at the front desk for in-person visits, via MyChart, or request a form during a virtual visit).  ?If you see the BHC more than 6 times, you will have to complete a comprehensive clinical assessment interview with the BHC to resume integrated services.  ?For virtual visits with the BHC, you must be physically in the state of [place] at the time of the visit. For example, if you live in [place], you will have to do an in-person visit with the BHC, and your out-of-state insurance may not cover behavioral health services in [place]. If you are going out of the state or country for any reason, the BHC may see you virtually when you return to [place], but not while you are physically outside of [place].  ?  ?Here is a link to the Pregnancy Navigators . Please ?Fill out prior to your New OB appointment.  ? ?English Link: [email or web address] ? ?Spanish Link: [email or web address].[email or web address]?[email or web address]  ?[email or web address] is a website with information to help you prepare for Labor and delivery, patient information, visitor information and more.   ?

## 2021-11-18 NOTE — Progress Notes (Signed)
New OB Intake ? ?I connected with  Kristin Bruce on 11/18/21 at 3:30 by MyChart Video Visit and verified that I am speaking with the correct person using two identifiers. Nurse is located at [facility] and pt is located at home. ? ?I discussed the limitations, risks, security and privacy concerns of performing an evaluation and management service by telephone and the availability of in person appointments. I also discussed with the patient that there may be a patient responsible charge related to this service. The patient expressed understanding and agreed to proceed. ? ?I explained I am completing New OB Intake today. We discussed her EDD of 06/13/22 that is based on US done 11/17/21.  Pt is G5/P1213. I reviewed her allergies, medications, Medical/Surgical/OB history, and appropriate screenings. I informed her of [facility] services. Due to history I offered and she accepted referral to bhc. Appointment made for [facility] for next week. Based on history, this is a/an  pregnancy complicated by Hx PTD .  ? ?Patient Active Problem List  ? Diagnosis Date Noted  ? Supervision of high risk pregnancy, antepartum 11/18/2021  ? History of preterm delivery, currently pregnant 11/18/2021  ? Suicidal thoughts   ? Adjustment disorder with mixed anxiety and depressed mood 11/02/2021  ? Anxiety state 11/02/2021  ? Alpha thalassemia silent carrier 12/09/2020  ? Migraine 10/30/2020  ? History of pre-eclampsia 09/30/2020  ? Herpes simplex type 1 infection 06/09/2017  ? Adjustment disorder with depressed mood 11/22/2015  ? ? ?Concerns addressed today ? ?Delivery Plans:  ?Plans to deliver at [facility].  ? ?MyChart/Babyscripts ?MyChart access verified. I explained pt will have some visits in office and some virtually. Babyscripts instructions given and order placed.  ? ?Blood Pressure Cuff  ?States thinks she still has her cuff from last pregnancy.  Explained after first prenatal appt pt will check weekly and document in Babyscripts. ? ?Weight scale:  Patient does not  have weight scale. Weight scale ordered for patient to pick up from [employer].  ? ?Anatomy US ?Explained first scheduled [place] will be around 19 weeks. Anatomy US scheduled for 01/18/22 at 0830. Pt notified to arrive at 0815. ? ?Labs ?Discussed Avelina Laine genetic screening with patient. Unsure about genetic testing. Routine prenatal labs needed. ? ?Covid Vaccine ?Patient has not had covid vaccine.  ? ?Is patient a CenteringPregnancy candidate?  ?Declined ?Declined due to Group Setting ? ?Is patient a Mom+Baby Combined Care candidate?  ?Not a candidate  due to has 3 children ? ?  ?Is patient interested in [place]?  ?Yes  "Interested in [facility] - Schedule next visit with CNM" on sticky note ? ?Informed patient of [facility] website  and placed link in her AVS.  ? ?Social Determinants of Health ?Food Insecurity: Patient denies food insecurity. ?WIC Referral: Patient is not interested in referral to [facility].  ?Transportation: Patient denies transportation needs. ?Childcare: Discussed no children allowed at ultrasound appointments. Offered childcare services; patient declines childcare services at this time. ? ?Send link to Pregnancy Navigators ? ? ?Placed OB Box on problem list and updated ? ?First visit review ?I reviewed new OB appt with pt. I explained she will have a pelvic exam, ob bloodwork with genetic screening, and PAP smear. Explained pt will be seen by Dr. Vergie Living at first visit; encounter routed to appropriate provider. Explained that patient will be seen by pregnancy navigator following visit with provider. [facility] information placed in AVS.  ? Nancy Fetter ?11/18/2021  4:30 PM  ?

## 2021-11-19 ENCOUNTER — Telehealth: Payer: Medicaid Other

## 2021-11-19 ENCOUNTER — Other Ambulatory Visit: Payer: Self-pay

## 2021-11-19 DIAGNOSIS — R0602 Shortness of breath: Secondary | ICD-10-CM

## 2021-11-19 DIAGNOSIS — R079 Chest pain, unspecified: Secondary | ICD-10-CM

## 2021-11-19 DIAGNOSIS — O0990 Supervision of high risk pregnancy, unspecified, unspecified trimester: Secondary | ICD-10-CM

## 2021-11-23 ENCOUNTER — Ambulatory Visit: Payer: Medicaid Other

## 2021-11-23 ENCOUNTER — Ambulatory Visit (INDEPENDENT_AMBULATORY_CARE_PROVIDER_SITE_OTHER): Payer: Medicaid Other | Admitting: Cardiology

## 2021-11-23 ENCOUNTER — Emergency Department (HOSPITAL_COMMUNITY): Payer: Medicaid Other

## 2021-11-23 ENCOUNTER — Emergency Department (HOSPITAL_COMMUNITY)
Admission: EM | Admit: 2021-11-23 | Discharge: 2021-11-24 | Disposition: A | Discharge status: Home / Self Care | Payer: Medicaid Other | Attending: Emergency Medicine | Admitting: Emergency Medicine

## 2021-11-23 ENCOUNTER — Other Ambulatory Visit: Payer: Self-pay | Admitting: Cardiology

## 2021-11-23 ENCOUNTER — Encounter: Payer: Self-pay | Admitting: Cardiology

## 2021-11-23 ENCOUNTER — Encounter (HOSPITAL_COMMUNITY): Payer: Self-pay | Admitting: Emergency Medicine

## 2021-11-23 ENCOUNTER — Other Ambulatory Visit: Payer: Self-pay

## 2021-11-23 VITALS — BP 98/72 | HR 90 | Ht 60.0 in | Wt 109.0 lb

## 2021-11-23 DIAGNOSIS — O99511 Diseases of the respiratory system complicating pregnancy, first trimester: Secondary | ICD-10-CM | POA: Insufficient documentation

## 2021-11-23 DIAGNOSIS — R002 Palpitations: Secondary | ICD-10-CM

## 2021-11-23 DIAGNOSIS — R079 Chest pain, unspecified: Secondary | ICD-10-CM

## 2021-11-23 DIAGNOSIS — R0602 Shortness of breath: Secondary | ICD-10-CM

## 2021-11-23 DIAGNOSIS — O98511 Other viral diseases complicating pregnancy, first trimester: Secondary | ICD-10-CM | POA: Diagnosis not present

## 2021-11-23 DIAGNOSIS — R0789 Other chest pain: Secondary | ICD-10-CM

## 2021-11-23 DIAGNOSIS — O26891 Other specified pregnancy related conditions, first trimester: Secondary | ICD-10-CM | POA: Diagnosis not present

## 2021-11-23 DIAGNOSIS — O99411 Diseases of the circulatory system complicating pregnancy, first trimester: Secondary | ICD-10-CM | POA: Diagnosis present

## 2021-11-23 DIAGNOSIS — R109 Unspecified abdominal pain: Secondary | ICD-10-CM | POA: Diagnosis not present

## 2021-11-23 DIAGNOSIS — O26899 Other specified pregnancy related conditions, unspecified trimester: Secondary | ICD-10-CM | POA: Diagnosis not present

## 2021-11-23 DIAGNOSIS — O99891 Other specified diseases and conditions complicating pregnancy: Secondary | ICD-10-CM | POA: Diagnosis not present

## 2021-11-23 DIAGNOSIS — M549 Dorsalgia, unspecified: Secondary | ICD-10-CM | POA: Diagnosis not present

## 2021-11-23 DIAGNOSIS — O99341 Other mental disorders complicating pregnancy, first trimester: Secondary | ICD-10-CM | POA: Diagnosis not present

## 2021-11-23 DIAGNOSIS — Z3A11 11 weeks gestation of pregnancy: Secondary | ICD-10-CM | POA: Insufficient documentation

## 2021-11-23 LAB — CBC
HCT: 31.9 % — ABNORMAL LOW (ref 36.0–46.0)
Hemoglobin: 10.1 g/dL — ABNORMAL LOW (ref 12.0–15.0)
MCH: 27.9 pg (ref 26.0–34.0)
MCHC: 31.7 g/dL (ref 30.0–36.0)
MCV: 88.1 fL (ref 80.0–100.0)
Platelets: 444 10*3/uL — ABNORMAL HIGH (ref 150–400)
RBC: 3.62 MIL/uL — ABNORMAL LOW (ref 3.87–5.11)
RDW: 14.6 % (ref 11.5–15.5)
WBC: 9.9 10*3/uL (ref 4.0–10.5)
nRBC: 0 % (ref 0.0–0.2)

## 2021-11-23 LAB — TROPONIN I (HIGH SENSITIVITY)
Troponin I (High Sensitivity): <2 ng/L (ref <18)
Troponin I (High Sensitivity): 2 ng/L (ref <18)

## 2021-11-23 LAB — BASIC METABOLIC PANEL
Anion gap: 7 (ref 5–15)
BUN: 13 mg/dL (ref 6–20)
CO2: 22 mmol/L (ref 22–32)
Calcium: 9.3 mg/dL (ref 8.9–10.3)
Chloride: 104 mmol/L (ref 98–111)
Creatinine, Ser: 0.54 mg/dL (ref 0.44–1.00)
GFR, Estimated: >60 mL/min (ref >60)
Glucose, Bld: 78 mg/dL (ref 70–99)
Potassium: 3.9 mmol/L (ref 3.5–5.1)
Sodium: 133 mmol/L — ABNORMAL LOW (ref 135–145)

## 2021-11-23 LAB — I-STAT BETA HCG BLOOD, ED (MC, WL, AP ONLY): I-stat hCG, quantitative: >2000 m[IU]/mL — ABNORMAL HIGH (ref <5)

## 2021-11-23 NOTE — Progress Notes (Signed)
?Cardio-Obstetrics Clinic ? ?New Evaluation ? ?Date:  11/23/2021  ? ?ID:  Kristin Bruce, DOB 1995-09-27, MRN [id number] ? ?PCP:  Pcp, No ?  ?[facility] Providers ?Cardiologist:  Thomasene Ripple, DO  ?Electrophysiologist:  None      ? ?Referring MD: Hermina Staggers, MD  ? ?Chief Complaint: " I am having chest pain" ? ?History of Present Illness:   ? ?Kristin Bruce is a 26 y.o. female [[id number]] who is being seen today for the evaluation of shortness of breath at the request of Hermina Staggers, MD.  ? ?History of anxiety, depression here today for evaluation for palpitations, chest pain and shortness of breath.  The patient tells me that she has been experiencing intermittent palpitations.  She describes abrupt onset of fast heartbeat which last for minutes at a time and then resolved.  After this she gets significant shortness of breath with chest pressure.  This is new.  She did not experience this with any of her other kids. ?She does tell me that there is heart failure in her family her mother who was in her 60s with diastolic heart failure and treatment at this time.  This is what makes her more worried. ? ? ? ?Prior CV Studies Reviewed: ?The following studies were reviewed today: ? ? ?Past Medical History:  ?Diagnosis Date  ? Anemia   ? Anxiety   ? Depression   ? HSV (herpes simplex virus) anogenital infection   ? Suicidal thoughts   ? during 1st pregnancy  ? Wears glasses   ? ? ?Past Surgical History:  ?Procedure Laterality Date  ? INCISION AND DRAINAGE ABSCESS N/A 05/13/2018  ? Procedure: INCISION AND DRAINAGE, PILONIDAL  ABSCESS;  Surgeon: Jimmye Norman, MD;  Location: [facility];  Service: General;  Laterality: N/A;  ? INCISION AND DRAINAGE PERIRECTAL ABSCESS N/A 04/01/2018  ? Procedure: IRRIGATION AND DEBRIDEMENT PERIRECTAL ABSCESS;  Surgeon: Violeta Gelinas, MD;  Location: [facility] OR;  Service: General;  Laterality: N/A;  ? PILONIDAL CYST EXCISION N/A 07/20/2018  ? Procedure: EXCISION OF PILONIDAL DISEASE;   Surgeon: Andria Meuse, MD;  Location: [facility];  Service: General;  Laterality: N/A;  ?   ? ?OB History   ? ? Gravida  ?5  ? Para  ?3  ? Term  ?1  ? Preterm  ?2  ? AB  ?1  ? Living  ?3  ?  ? ? SAB  ?1  ? IAB  ?0  ? Ectopic  ?0  ? Multiple  ?0  ? Live Births  ?3  ?   ?  ?  ?    ? ? ?Current Medications: ?Current Meds  ?Medication Sig  ? acetaminophen (TYLENOL) 500 MG tablet Take 1,000 mg by mouth every 6 (six) hours as needed for moderate pain.  ?  ? ?Allergies:   Patient has no known allergies.  ? ?Social History  ? ?Socioeconomic History  ? Marital status: Single  ?  Spouse name: Not on file  ? Number of children: 2  ? Years of education: Not on file  ? Highest education level: Not on file  ?Occupational History  ? Not on file  ?Tobacco Use  ? Smoking status: Never  ? Smokeless tobacco: Never  ?Vaping Use  ? Vaping Use: Never used  ?Substance and Sexual Activity  ? Alcohol use: Not Currently  ?  Comment: not since pregnancy  ? Drug use: Not Currently  ?  Types: Marijuana  ?  Comment: no using for 2 months  ? Sexual activity: Yes  ?  Birth control/protection: None  ?Other Topics Concern  ? Not on file  ?Social History Narrative  ? Not on file  ? ?Social Determinants of Health  ? ?Financial Resource Strain: Not on file  ?Food Insecurity: No Food Insecurity  ? Worried About Programme researcher, broadcasting/film/videounning Out of Food in the Last Year: Never true  ? Ran Out of Food in the Last Year: Never true  ?Transportation Needs: Unmet Transportation Needs  ? Lack of Transportation (Medical): No  ? Lack of Transportation (Non-Medical): Yes  ?Physical Activity: Not on file  ?Stress: Not on file  ?Social Connections: Not on file  ?  ? ? ?Family History  ?Problem Relation Age of Onset  ? Diabetes Mother   ? Hypertension Mother   ? Heart failure Mother   ? Diabetes Maternal Grandfather   ?   ? ?ROS:   ?Review of Systems  ?Constitution: Negative for decreased appetite, fever and weight gain.  ?HENT: Negative for congestion, ear  discharge, hoarse voice and sore throat.   ?Eyes: Negative for discharge, redness, vision loss in right eye and visual halos.  ?Cardiovascular: Report palpitations,chest pain, dyspnea on exertion, negative for leg swelling, orthopnea.Marland Kitchen.  ?Respiratory: Negative for cough, hemoptysis, shortness of breath and snoring.   ?Endocrine: Negative for heat intolerance and polyphagia.  ?Hematologic/Lymphatic: Negative for bleeding problem. Does not bruise/bleed easily.  ?Skin: Negative for flushing, nail changes, rash and suspicious lesions.  ?Musculoskeletal: Negative for arthritis, joint pain, muscle cramps, myalgias, neck pain and stiffness.  ?Gastrointestinal: Negative for abdominal pain, bowel incontinence, diarrhea and excessive appetite.  ?Genitourinary: Negative for decreased libido, genital sores and incomplete emptying.  ?Neurological: Negative for brief paralysis, focal weakness, headaches and loss of balance.  ?Psychiatric/Behavioral: Negative for altered mental status, depression and suicidal ideas.  ?Allergic/Immunologic: Negative for HIV exposure and persistent infections.  ? ? ? ?Labs/EKG Reviewed:   ? ?EKG:   ?EKG is was ordered today.  The ekg ordered today demonstrates sinus rhythm heart rate 90 bpm. ? ?Recent Labs: ?10/21/2021: ALT 16 ?11/03/2021: BUN 17; Creatinine, Ser 0.52; Hemoglobin 10.9; Magnesium 1.8; Platelets 337; Potassium 3.5; Sodium 135  ? ?Recent Lipid Panel ?No results found for: CHOL, TRIG, HDL, CHOLHDL, LDLCALC, LDLDIRECT ? ?Physical Exam:   ? ?VS:  BP 98/72 (BP Location: Left Arm, Patient Position: Sitting, Cuff Size: Normal)   Pulse 90   Ht 5' (1.524 m)   Wt 109 lb (49.4 kg)   LMP 09/07/2021 (Within Days)   SpO2 99%   BMI 21.29 kg/m?    ? ?Wt Readings from Last 3 Encounters:  ?11/23/21 109 lb (49.4 kg)  ?11/17/21 108 lb (49 kg)  ?11/03/21 115 lb (52.2 kg)  ?  ? ?GEN:  Well nourished, well developed in no acute distress ?HEENT: Normal ?NECK: No JVD; No carotid bruits ?LYMPHATICS: No  lymphadenopathy ?CARDIAC: RRR, no murmurs, rubs, gallops ?RESPIRATORY:  Clear to auscultation without rales, wheezing or rhonchi  ?ABDOMEN: Soft, non-tender, non-distended ?MUSCULOSKELETAL:  No edema; No deformity  ?SKIN: Warm and dry ?NEUROLOGIC:  Alert and oriented x 3 ?PSYCHIATRIC:  Normal affect  ? ? ?Risk Assessment/Risk Calculators:   ?  ?CARPREG II ?Risk Prediction Index Score:  1.  The patient's risk for a primary cardiac event is 5%. ?  ?   ?  ?  ? ? ?ASSESSMENT & PLAN:   ? ?Shortness of breath ?Palpitation ? ?I would like to rule out a cardiovascular  etiology of this palpitation, therefore at this time I would like to placed a zio patch for  14  days. In additon for the shortness of breath a transthoracic echocardiogram will be ordered to assess LV/RV function and any structural abnormalities. Once these testing have been performed amd reviewed further reccomendations will be made. For now, I do reccomend that the patient goes to the nearest ED if  symptoms recur. ? ?The patient is in agreement with the above plan. The patient left the office in stable condition.  The patient will follow up in 12 weeks. ? ?Patient Instructions  ?Medication Instructions:  ?Your physician recommends that you continue on your current medications as directed. Please refer to the Current Medication list given to you today.  ?*If you need a refill on your cardiac medications before your next appointment, please call your pharmacy* ? ? ?Lab Work: ?None ?If you have labs (blood work) drawn today and your tests are completely normal, you will receive your results only by: ?MyChart Message (if you have MyChart) OR ?A paper copy in the mail ?If you have any lab test that is abnormal or we need to change your treatment, we will call you to review the results. ? ? ?Testing/Procedures: ?Your physician has requested that you have an echocardiogram. Echocardiography is a painless test that uses sound waves to create images of your heart.  It provides your doctor with information about the size and shape of your heart and how well your heart?s chambers and valves are working. This procedure takes approximately one hour. There are no restrictions for this proc

## 2021-11-23 NOTE — Progress Notes (Unsigned)
Enrolled for [employer] to mail a ZIO XT long term holter monitor to [facility], [address], [place], [place]  [address]. ?

## 2021-11-23 NOTE — Patient Instructions (Addendum)
Medication Instructions:  ?Your physician recommends that you continue on your current medications as directed. Please refer to the Current Medication list given to you today.  ?*If you need a refill on your cardiac medications before your next appointment, please call your pharmacy* ? ? ?Lab Work: ?None ?If you have labs (blood work) drawn today and your tests are completely normal, you will receive your results only by: ?MyChart Message (if you have MyChart) OR ?A paper copy in the mail ?If you have any lab test that is abnormal or we need to change your treatment, we will call you to review the results. ? ? ?Testing/Procedures: ?Your physician has requested that you have an echocardiogram. Echocardiography is a painless test that uses sound waves to create images of your heart. It provides your doctor with information about the size and shape of your heart and how well your heart?s chambers and valves are working. This procedure takes approximately one hour. There are no restrictions for this procedure. ? ?ZIO XT- Long Term Monitor Instructions ? ?Your physician has requested you wear a ZIO patch monitor for 7 days.  ?This is a single patch monitor. [employer] supplies one patch monitor per enrollment. Additional ?stickers are not available. Please do not apply patch if you will be having a Nuclear Stress Test,  ?Echocardiogram, Cardiac CT, MRI, or Chest Xray during the period you would be wearing the  ?monitor. The patch cannot be worn during these tests. You cannot remove and re-apply the  ?ZIO XT patch monitor.  ?Your ZIO patch monitor will be mailed 3 day USPS to your address on file. It may take 3-5 days  ?to receive your monitor after you have been enrolled.  ?Once you have received your monitor, please review the enclosed instructions. Your monitor  ?has already been registered assigning a specific monitor serial # to you. ? ?Billing and Patient Assistance Program Information ? ?We have supplied [employer] with  any of your insurance information on file for billing purposes. ?[employer] offers a sliding scale Patient Assistance Program for patients that do not have  ?insurance, or whose insurance does not completely cover the cost of the ZIO monitor.  ?You must apply for the Patient Assistance Program to qualify for this discounted rate.  ?To apply, please call [employer] at [phone], select option 4, select option 2, ask to apply for  ?Patient Assistance Program. Meredeth Ide will ask your household income, and how many people  ?are in your household. They will quote your out-of-pocket cost based on that information.  ?[employer] will also be able to set up a 89-month, interest-free payment plan if needed. ? ?Applying the monitor ?  ?Shave hair from upper left chest.  ?Hold abrader disc by orange tab. Rub abrader in 40 strokes over the upper left chest as  ?indicated in your monitor instructions.  ?Clean area with 4 enclosed alcohol pads. Let dry.  ?Apply patch as indicated in monitor instructions. Patch will be placed under collarbone on left  ?side of chest with arrow pointing upward.  ?Rub patch adhesive wings for 2 minutes. Remove white label marked "1". Remove the white  ?label marked "2". Rub patch adhesive wings for 2 additional minutes.  ?While looking in a mirror, press and release button in center of patch. A small green light will  ?flash 3-4 times. This will be your only indicator that the monitor has been turned on.  ?Do not shower for the first 24 hours. You may shower after the first  24 hours.  ?Press the button if you feel a symptom. You will hear a small click. Record Date, Time and  ?Symptom in the Patient Logbook.  ?When you are ready to remove the patch, follow instructions on the last 2 pages of Patient  ?Logbook. Stick patch monitor onto the last page of Patient Logbook.  ?Place Patient Logbook in the blue and white box. Use locking tab on box and tape box closed  ?securely. The blue and white box has prepaid  postage on it. Please place it in the mailbox as  ?soon as possible. Your physician should have your test results approximately 7 days after the  ?monitor has been mailed back to [facility].  ?Call [facility] at [phone] if you have questions regarding  ?your ZIO XT patch monitor. Call them immediately if you see an orange light blinking on your  ?monitor.  ?If your monitor falls off in less than 4 days, contact our Monitor department at [phone].  ?If your monitor becomes loose or falls off after 4 days call [employer] at [phone] for  ?suggestions on securing your monitor ? ? ? ?Follow-Up: ?At [facility], you and your health needs are our priority.  As part of our continuing mission to provide you with exceptional heart care, we have created designated Provider Care Teams.  These Care Teams include your primary Cardiologist (physician) and Advanced Practice Providers (APPs -  Physician Assistants and Nurse Practitioners) who all work together to provide you with the care you need, when you need it. ? ?We recommend signing up for the patient portal called "MyChart".  Sign up information is provided on this After Visit Summary.  MyChart is used to connect with patients for Virtual Visits (Telemedicine).  Patients are able to view lab/test results, encounter notes, upcoming appointments, etc.  Non-urgent messages can be sent to your provider as well.   ?To learn more about what you can do with MyChart, go to [email or web address].   ? ?Your next appointment:   ?12 week(s) ? ?The format for your next appointment:   ?In Person ? ?Provider:   ?Thomasene Ripple, DO   ? ? ?Other Instructions ? ? ?Important Information About Sugar ? ? ? ? ?  ?

## 2021-11-23 NOTE — ED Provider Notes (Signed)
?[place] ?Provider Note ? ? ?CSN: [id number] ?Arrival date & time: 11/23/21  1939 ? ?  ? ?History ? ?Chief Complaint  ?Patient presents with  ? Shortness of Breath  ? ? ?Kristin Bruce is a 26 y.o. female. ? ?Patient presents to the emergency department for evaluation of shortness of breath, heart palpitations, chest pain.  Patient reports that she has been dealing with this daily for the last 3 months.  She has been seen by her primary care doctor and was referred to cardiology.  She saw cardiology this morning and they are arranging for a Zio patch.  She was told to come to the ER if she had recurrent symptoms which did occur today. ? ?Patient describes episodes of heart palpitations which resolved after a few minutes but then she is left with feelings of shortness of breath and chest pain.  Episode today is no different from her daily episodes. ? ? ?  ? ?Home Medications ?Prior to Admission medications   ?Medication Sig Start Date End Date Taking? Authorizing Provider  ?acetaminophen (TYLENOL) 500 MG tablet Take 1,000 mg by mouth every 6 (six) hours as needed for moderate pain.    [provider]  ?Blood Pressure Monitoring (BLOOD PRESSURE KIT) DEVI 1 Device by Does not apply route as needed. ?Patient not taking: Reported on 11/23/2021 10/30/20   Caren Macadam, MD  ?butalbital-acetaminophen-caffeine [facility]) [phone] MG tablet Take 1-2 tablets by mouth every 6 (six) hours as needed for headache. ?Patient not taking: Reported on 11/23/2021 11/17/21 11/17/22  Seabron Spates, CNM  ?Doxylamine-Pyridoxine 10-10 MG TBEC Take 1 each by mouth at bedtime. If you continue to have nausea take another in the morning ?Patient not taking: Reported on 11/23/2021 11/03/21   Deno Etienne, DO  ?ferrous sulfate 325 (65 FE) MG tablet Take 1 tablet (325 mg total) by mouth every other day. ?Patient not taking: Reported on 11/23/2021 02/06/21 02/06/22  Nugent, Gerrie Nordmann, NP  ?fluticasone  (FLONASE) 50 MCG/ACT nasal spray Place 2 sprays into both nostrils once for 1 dose. ?Patient not taking: Reported on 11/23/2021 11/17/21 11/17/21  Seabron Spates, CNM  ?hydrOXYzine (ATARAX) 25 MG tablet Take 1 tablet (25 mg total) by mouth every 6 (six) hours. ?Patient not taking: Reported on 11/18/2021 09/30/21   Tedd Sias, PA  ?metoCLOPramide (REGLAN) 10 MG tablet Take 1 tablet (10 mg total) by mouth every 6 (six) hours. ?Patient not taking: Reported on 11/23/2021 11/03/21   Deno Etienne, DO  ?Misc. Devices (GOJJI WEIGHT SCALE) MISC 1 Device by Does not apply route as needed. ?Patient not taking: Reported on 11/23/2021 11/18/21   Aletha Halim, MD  ?potassium chloride SA (KLOR-CON M) 20 MEQ tablet Take 1 tablet (20 mEq total) by mouth daily for 10 days. ?Patient not taking: Reported on 11/23/2021 10/22/21 11/01/21  Fatima Blank, MD  ?Prenatal Vit-Fe Fumarate-FA (PRENATAL VITAMIN) 27-0.8 MG TABS Take 1 tablet by mouth as needed. ?Patient not taking: Reported on 11/23/2021 11/18/21   Aletha Halim, MD  ?Prenatal Vit-Fe Fumarate-FA (PREPLUS) 27-1 MG TABS Take 1 tablet by mouth daily. ?Patient not taking: Reported on 11/23/2021 03/03/21   Renard Matter, MD  ?   ? ?Allergies    ?Patient has no known allergies.   ? ?Review of Systems   ?Review of Systems ? ?Physical Exam ?Updated Vital Signs ?BP 119/81 (BP Location: Right Arm)   Pulse 87   Temp 98.9 ?F (37.2 ?C) (Oral)  Resp 16   LMP 09/07/2021 (Within Days)   SpO2 100%  ?Physical Exam ?Vitals and nursing note reviewed.  ?Constitutional:   ?   General: She is not in acute distress. ?   Appearance: She is well-developed.  ?HENT:  ?   Head: Normocephalic and atraumatic.  ?   Mouth/Throat:  ?   Mouth: Mucous membranes are moist.  ?Eyes:  ?   General: Vision grossly intact. Gaze aligned appropriately.  ?   Extraocular Movements: Extraocular movements intact.  ?   Conjunctiva/sclera: Conjunctivae normal.  ?Cardiovascular:  ?   Rate and Rhythm: Normal rate and regular rhythm.   ?   Pulses: Normal pulses.  ?   Heart sounds: Normal heart sounds, S1 normal and S2 normal. No murmur heard. ?  No friction rub. No gallop.  ?Pulmonary:  ?   Effort: Pulmonary effort is normal. No respiratory distress.  ?   Breath sounds: Normal breath sounds.  ?Abdominal:  ?   General: Bowel sounds are normal.  ?   Palpations: Abdomen is soft.  ?   Tenderness: There is no abdominal tenderness. There is no guarding or rebound.  ?   Hernia: No hernia is present.  ?Musculoskeletal:     ?   General: No swelling.  ?   Cervical back: Full passive range of motion without pain, normal range of motion and neck supple. No spinous process tenderness or muscular tenderness. Normal range of motion.  ?   Right lower leg: No edema.  ?   Left lower leg: No edema.  ?Skin: ?   General: Skin is warm and dry.  ?   Capillary Refill: Capillary refill takes less than 2 seconds.  ?   Findings: No ecchymosis, erythema, rash or wound.  ?Neurological:  ?   General: No focal deficit present.  ?   Mental Status: She is alert and oriented to person, place, and time.  ?   GCS: GCS eye subscore is 4. GCS verbal subscore is 5. GCS motor subscore is 6.  ?   Cranial Nerves: Cranial nerves 2-12 are intact.  ?   Sensory: Sensation is intact.  ?   Motor: Motor function is intact.  ?   Coordination: Coordination is intact.  ?Psychiatric:     ?   Attention and Perception: Attention normal.     ?   Mood and Affect: Mood normal.     ?   Speech: Speech normal.     ?   Behavior: Behavior normal.  ? ? ?ED Results / Procedures / Treatments   ?Labs ?(all labs ordered are listed, but only abnormal results are displayed) ?Labs Reviewed  ?BASIC METABOLIC PANEL - Abnormal; Notable for the following components:  ?    Result Value  ? Sodium 133 (*)   ? All other components within normal limits  ?CBC - Abnormal; Notable for the following components:  ? RBC 3.62 (*)   ? Hemoglobin 10.1 (*)   ? HCT 31.9 (*)   ? Platelets 444 (*)   ? All other components within normal  limits  ?I-STAT BETA HCG BLOOD, ED ([facility], [facility], [facility] ONLY) - Abnormal; Notable for the following components:  ? I-stat hCG, quantitative >2,000.0 (*)   ? All other components within normal limits  ?TROPONIN I (HIGH SENSITIVITY)  ?TROPONIN I (HIGH SENSITIVITY)  ? ? ?EKG ?EKG Interpretation ? ?Date/Time:  Monday Nov 23 2021 19:45:46 EDT ?Ventricular Rate:  93 ?PR Interval:  130 ?QRS Duration: 70 ?  QT Interval:  332 ?QTC Calculation: 412 ?R Axis:   40 ?Text Interpretation: Normal sinus rhythm Normal ECG Confirmed by Orpah Greek [phone]) on 11/23/2021 11:13:04 PM ? ?Radiology ?DG Chest 2 View ? ?Result Date: 11/23/2021 ?CLINICAL DATA:  Chest pain, dyspnea EXAM: CHEST - 2 VIEW COMPARISON:  None Available. FINDINGS: The heart size and mediastinal contours are within normal limits. Both lungs are clear. The visualized skeletal structures are unremarkable. IMPRESSION: No active cardiopulmonary disease. Electronically Signed   By: Fidela Salisbury M.D.   On: 11/23/2021 20:24   ? ?Procedures ?Procedures  ? ? ?Medications Ordered in ED ?Medications - No data to display ? ?ED Course/ Medical Decision Making/ A&P ?  ?                        ?Medical Decision Making ?Amount and/or Complexity of Data Reviewed ?Labs: ordered. ?Radiology: ordered. ? ? ?Patient presents to the emergency department with complaints of chest pain, heart palpitations, shortness of breath.  This has been a chronic problem over the last 3 months.  Patient has been seen by her primary care doctor and did see cardiology earlier today.  I have access the note, she is having an appropriate outpatient work-up including Zio patch and transthoracic echo ordered. ? ?Her work-up today has been reassuring.  She has an uncomplicated EKG, negative troponin x2.  Chest x-ray is normal.  This is helpful to rule out pneumonia, congestive heart failure, cardiomyopathy.  She does not have any significant risk factors for ACS.  She is pregnant, 11 weeks.  No obstetric  complaints.  This would be early for her to have pregnancy complications including dilated cardiomyopathy and PE.  As the symptoms have been daily for 3 months, PE is not considered likely.  I do not feel that she nee

## 2021-11-23 NOTE — ED Triage Notes (Signed)
Pt brought to ED with c/o shortness of breath and heart palpitations that has been occurring throughout the day and has not resolved. Pt is approximately [redacted] weeks gestation. Denies any prenatal care at this time. First OV appointment is 12/03/2020.  ? ?EMS Vitals  ?BP 110/72 ?HR 100 ?RR 20 ?Cbg 85 ?

## 2021-11-24 ENCOUNTER — Other Ambulatory Visit: Payer: Self-pay

## 2021-11-24 ENCOUNTER — Inpatient Hospital Stay (EMERGENCY_DEPARTMENT_HOSPITAL)
Admission: AD | Admit: 2021-11-24 | Discharge: 2021-11-24 | Disposition: A | Discharge status: Home / Self Care | Payer: Medicaid Other | Source: Home / Self Care | Attending: Obstetrics and Gynecology | Admitting: Obstetrics and Gynecology

## 2021-11-24 ENCOUNTER — Encounter: Payer: Self-pay | Admitting: Obstetrics and Gynecology

## 2021-11-24 DIAGNOSIS — O98511 Other viral diseases complicating pregnancy, first trimester: Secondary | ICD-10-CM | POA: Insufficient documentation

## 2021-11-24 DIAGNOSIS — O0990 Supervision of high risk pregnancy, unspecified, unspecified trimester: Secondary | ICD-10-CM

## 2021-11-24 DIAGNOSIS — O09899 Supervision of other high risk pregnancies, unspecified trimester: Secondary | ICD-10-CM

## 2021-11-24 DIAGNOSIS — O99891 Other specified diseases and conditions complicating pregnancy: Secondary | ICD-10-CM | POA: Insufficient documentation

## 2021-11-24 DIAGNOSIS — O26891 Other specified pregnancy related conditions, first trimester: Secondary | ICD-10-CM | POA: Insufficient documentation

## 2021-11-24 DIAGNOSIS — O99341 Other mental disorders complicating pregnancy, first trimester: Secondary | ICD-10-CM | POA: Insufficient documentation

## 2021-11-24 DIAGNOSIS — Z3A11 11 weeks gestation of pregnancy: Secondary | ICD-10-CM

## 2021-11-24 DIAGNOSIS — R109 Unspecified abdominal pain: Secondary | ICD-10-CM

## 2021-11-24 DIAGNOSIS — R45851 Suicidal ideations: Secondary | ICD-10-CM

## 2021-11-24 DIAGNOSIS — O26899 Other specified pregnancy related conditions, unspecified trimester: Secondary | ICD-10-CM

## 2021-11-24 DIAGNOSIS — M549 Dorsalgia, unspecified: Secondary | ICD-10-CM

## 2021-11-24 LAB — URINALYSIS, ROUTINE W REFLEX MICROSCOPIC
Bilirubin Urine: NEGATIVE
Glucose, UA: NEGATIVE mg/dL
Hgb urine dipstick: NEGATIVE
Ketones, ur: 5 mg/dL — AB
Leukocytes,Ua: NEGATIVE
Nitrite: NEGATIVE
Protein, ur: NEGATIVE mg/dL
Specific Gravity, Urine: 1.023 (ref 1.005–1.030)
pH: 7 (ref 5.0–8.0)

## 2021-11-24 NOTE — MAU Note (Signed)
Kristin Bruce is a 26 y.o. at [redacted]w[redacted]d here in MAU reporting: lower back pain and lower abdominal cramping.  Reports seen @ [facility] yesterday and upon looking @ MyChart today noticed HCG level had fallen.  States instructed to be seen in MAU by OB to determine if had miscarriage.  Denies VB. ? ?Onset of complaint: today ?Pain score: 10/10 back & abdomen 7/10 ?Vitals:  ? 11/24/21 1852  ?BP: 115/73  ?Pulse: 98  ?Resp: 20  ?Temp: 99.4 ?F (37.4 ?C)  ?SpO2: 100%  ?   ?FHT: 169 bpm ?Lab orders placed from triage:    ?

## 2021-11-24 NOTE — MAU Note (Signed)
Called pt in lobby to bring her into Triage to give her d/c instructions but pt was not in lobby. Had discussed d/c plan with Donette Larry CNM but did not receive written d/c papers.  ?

## 2021-11-24 NOTE — MAU Provider Note (Signed)
?History  ?  ? ?CSN: [id number] ? ?Arrival date and time: 11/24/21 1816 ? ? Event Date/Time  ? First Provider Initiated Contact with Patient 11/24/21 2107   ?  ? ?Chief Complaint  ?Patient presents with  ? Back Pain  ? Abdominal Pain  ? ?26 year old G5 P1-2-1-3 at 11.2 weeks presenting with concerns over a lab value she saw in my chart.  Patient was seen in [facility] yesterday for chest pain and i-STAT was performed.  She was concerned that her pregnancy level had dropped.  She wants to know if she is having a miscarriage because she needs to prepare herself.  States she has 3 girls at home and this baby will be her first boy. endorses low back pain that has been ongoing.  Pain is constant and burning.  Rates 10 out of 10.  Has not treated.  Denies heavy lifting or injury.  Denies urinary symptoms.  Also reports lower abdominal cramping since she was sitting in the waiting room today.  Rates 7 out of 10.  Denies vaginal bleeding.  Denies vaginal itching or discharge. ? ? ?OB History   ? ? Gravida  ?5  ? Para  ?3  ? Term  ?1  ? Preterm  ?2  ? AB  ?1  ? Living  ?3  ?  ? ? SAB  ?1  ? IAB  ?0  ? Ectopic  ?0  ? Multiple  ?0  ? Live Births  ?3  ?   ?  ?  ? ? ?Past Medical History:  ?Diagnosis Date  ? Anemia   ? Anxiety   ? Depression   ? HSV (herpes simplex virus) anogenital infection   ? Suicidal thoughts   ? during 1st pregnancy  ? Wears glasses   ? ? ?Past Surgical History:  ?Procedure Laterality Date  ? INCISION AND DRAINAGE ABSCESS N/A 05/13/2018  ? Procedure: INCISION AND DRAINAGE, PILONIDAL  ABSCESS;  Surgeon: Judeth Horn, MD;  Location: [place];  Service: General;  Laterality: N/A;  ? [place] N/A 04/01/2018  ? Procedure: IRRIGATION AND DEBRIDEMENT PERIRECTAL ABSCESS;  Surgeon: Georganna Skeans, MD;  Location: [place];  Service: General;  Laterality: N/A;  ? PILONIDAL CYST EXCISION N/A 07/20/2018  ? Procedure: EXCISION OF PILONIDAL DISEASE;  Surgeon: Ileana Roup, MD;  Location: [facility];  Service: General;  Laterality: N/A;  ? ? ?Family History  ?Problem Relation Age of Onset  ? Diabetes Mother   ? Hypertension Mother   ? Heart failure Mother   ? Diabetes Maternal Grandfather   ? ? ?Social History  ? ?Tobacco Use  ? Smoking status: Never  ? Smokeless tobacco: Never  ?Vaping Use  ? Vaping Use: Never used  ?Substance Use Topics  ? Alcohol use: Not Currently  ?  Comment: not since pregnancy  ? Drug use: Not Currently  ?  Types: Marijuana  ?  Comment: no using for 2 months  ? ? ?Allergies: No Known Allergies ? ?Medications Prior to Admission  ?Medication Sig Dispense Refill Last Dose  ? acetaminophen (TYLENOL) 500 MG tablet Take 1,000 mg by mouth every 6 (six) hours as needed for moderate pain.     ? Blood Pressure Monitoring (BLOOD PRESSURE KIT) DEVI 1 Device by Does not apply route as needed. (Patient not taking: Reported on 11/23/2021) 1 each 0   ? butalbital-acetaminophen-caffeine (FIORICET) 50-325-40 MG tablet Take 1-2 tablets by mouth every 6 (six) hours as needed  for headache. (Patient not taking: Reported on 11/23/2021) 20 tablet 0   ? Doxylamine-Pyridoxine 10-10 MG TBEC Take 1 each by mouth at bedtime. If you continue to have nausea take another in the morning (Patient not taking: Reported on 11/23/2021) 60 tablet 0   ? ferrous sulfate 325 (65 FE) MG tablet Take 1 tablet (325 mg total) by mouth every other day. (Patient not taking: Reported on 11/23/2021) 30 tablet 3   ? fluticasone (FLONASE) 50 MCG/ACT nasal spray Place 2 sprays into both nostrils once for 1 dose. (Patient not taking: Reported on 11/23/2021) 9.9 mL 2   ? hydrOXYzine (ATARAX) 25 MG tablet Take 1 tablet (25 mg total) by mouth every 6 (six) hours. (Patient not taking: Reported on 11/18/2021) 21 tablet 0   ? metoCLOPramide (REGLAN) 10 MG tablet Take 1 tablet (10 mg total) by mouth every 6 (six) hours. (Patient not taking: Reported on 11/23/2021) 30 tablet 0   ? Misc. Devices (GOJJI WEIGHT SCALE) MISC 1 Device by Does not  apply route as needed. (Patient not taking: Reported on 11/23/2021) 1 each 0   ? potassium chloride SA (KLOR-CON M) 20 MEQ tablet Take 1 tablet (20 mEq total) by mouth daily for 10 days. (Patient not taking: Reported on 11/23/2021) 10 tablet 0   ? Prenatal Vit-Fe Fumarate-FA (PRENATAL VITAMIN) 27-0.8 MG TABS Take 1 tablet by mouth as needed. (Patient not taking: Reported on 11/23/2021) 30 tablet 11   ? Prenatal Vit-Fe Fumarate-FA (PREPLUS) 27-1 MG TABS Take 1 tablet by mouth daily. (Patient not taking: Reported on 11/23/2021) 90 tablet 2   ? ? ?Review of Systems  ?Constitutional:  Negative for chills and fever.  ?Gastrointestinal:  Positive for abdominal pain.  ?Genitourinary:  Negative for dysuria, frequency, hematuria, urgency, vaginal bleeding and vaginal discharge.  ?Musculoskeletal:  Positive for back pain.  ?Physical Exam  ? ?Blood pressure 115/73, pulse 98, temperature 99.4 ?F (37.4 ?C), temperature source Oral, resp. rate 20, last menstrual period 09/07/2021, SpO2 100 %, unknown if currently breastfeeding. ? ?Physical Exam ?Vitals and nursing note reviewed.  ?Constitutional:   ?   General: She is not in acute distress (appears comfortable). ?HENT:  ?   Head: Normocephalic and atraumatic.  ?Cardiovascular:  ?   Rate and Rhythm: Normal rate.  ?Pulmonary:  ?   Effort: Pulmonary effort is normal. No respiratory distress.  ?Abdominal:  ?   General: There is no distension.  ?   Palpations: Abdomen is soft. There is no mass.  ?   Tenderness: There is no abdominal tenderness. There is no right CVA tenderness, left CVA tenderness, guarding or rebound.  ?   Hernia: No hernia is present.  ?Musculoskeletal:     ?   General: Normal range of motion.  ?   Cervical back: Normal and normal range of motion.  ?   Thoracic back: Normal.  ?   Lumbar back: Normal.  ?Skin: ?   General: Skin is warm and dry.  ?Neurological:  ?   General: No focal deficit present.  ?   Mental Status: She is alert and oriented to person, place, and time.   ?Psychiatric:     ?   Mood and Affect: Mood is anxious.     ?   Behavior: Behavior normal.  ?FHT 169 ? ?MAU Course  ?Procedures ? ?MDM ?No signs of miscarriage or acute process.  Patient informed and reassured that i-STAT can only read QhCG up to 2000 and this test modality is  different from serum qhcg.  UA pending, will treat if indicated. Stable for discharge. ? ?Assessment and Plan  ?[redacted] weeks gestation ?Back pain  ?Abdominal pain ?Discharge home ?[facility] at [facility] as scheduled ?SAB precautions ? ?Allergies as of 11/24/2021   ?No Known Allergies ?  ? ?  ?Medication List  ?  ? ?STOP taking these medications   ? ?ferrous sulfate 325 (65 FE) MG tablet ?  ?hydrOXYzine 25 MG tablet ?Commonly known as: ATARAX ?  ?potassium chloride SA 20 MEQ tablet ?Commonly known as: KLOR-CON M ?  ? ?  ? ?TAKE these medications   ? ?acetaminophen 500 MG tablet ?Commonly known as: TYLENOL ?Take 1,000 mg by mouth every 6 (six) hours as needed for moderate pain. ?  ?Blood Pressure Kit Devi ?1 Device by Does not apply route as needed. ?  ?butalbital-acetaminophen-caffeine 50-325-40 MG tablet ?Commonly known as: FIORICET ?Take 1-2 tablets by mouth every 6 (six) hours as needed for headache. ?  ?Doxylamine-Pyridoxine 10-10 MG Tbec ?Take 1 each by mouth at bedtime. If you continue to have nausea take another in the morning ?  ?fluticasone 50 MCG/ACT nasal spray ?Commonly known as: FLONASE ?Place 2 sprays into both nostrils once for 1 dose. ?  ?Gojji Weight Scale Misc ?1 Device by Does not apply route as needed. ?  ?metoCLOPramide 10 MG tablet ?Commonly known as: REGLAN ?Take 1 tablet (10 mg total) by mouth every 6 (six) hours. ?  ?PrePLUS 27-1 MG Tabs ?Take 1 tablet by mouth daily. ?What changed: Another medication with the same name was removed. Continue taking this medication, and follow the directions you see here. ?  ? ?  ? ? ?Julianne Handler, CNM ?11/24/2021, 9:23 PM  ?

## 2021-11-24 NOTE — MAU Note (Signed)
Donette Larry CNM brought pt to Triage to discuss test result and plan of care ?

## 2021-11-24 NOTE — Discharge Instructions (Addendum)
Follow-up with cardiology further recommended testing. ?

## 2021-11-25 ENCOUNTER — Ambulatory Visit: Payer: Medicaid Other | Admitting: Clinical

## 2021-11-25 DIAGNOSIS — Z91199 Patient's noncompliance with other medical treatment and regimen due to unspecified reason: Secondary | ICD-10-CM

## 2021-11-25 NOTE — BH Specialist Note (Signed)
Pt did not arrive to video visit and did not answer the phone; Left HIPPA-compliant message to call back Adaleena Mooers from [facility] at  [phone] (Fenix Rorke's office).  ?; left MyChart message for patient.  ? ?

## 2021-11-30 ENCOUNTER — Encounter: Payer: Self-pay | Admitting: Obstetrics and Gynecology

## 2021-12-03 ENCOUNTER — Ambulatory Visit (HOSPITAL_COMMUNITY): Payer: Medicaid Other | Attending: Cardiovascular Disease

## 2021-12-03 ENCOUNTER — Encounter: Payer: Self-pay | Admitting: Obstetrics and Gynecology

## 2021-12-03 ENCOUNTER — Encounter: Payer: Medicaid Other | Admitting: Obstetrics and Gynecology

## 2021-12-06 NOTE — Progress Notes (Signed)
Patient did not keep her new OB appointment for 12/03/2021.  Cornelia Copa MD Attending [facility] [employer])

## 2021-12-07 ENCOUNTER — Encounter (HOSPITAL_COMMUNITY): Payer: Self-pay | Admitting: Cardiology

## 2021-12-16 ENCOUNTER — Telehealth: Payer: Self-pay

## 2021-12-17 ENCOUNTER — Telehealth: Payer: Self-pay

## 2022-01-11 ENCOUNTER — Encounter: Payer: Self-pay | Admitting: Obstetrics and Gynecology

## 2022-01-11 ENCOUNTER — Encounter: Payer: Medicaid Other | Admitting: Obstetrics and Gynecology

## 2022-01-11 DIAGNOSIS — O09893 Supervision of other high risk pregnancies, third trimester: Secondary | ICD-10-CM | POA: Insufficient documentation

## 2022-01-11 DIAGNOSIS — O09892 Supervision of other high risk pregnancies, second trimester: Secondary | ICD-10-CM | POA: Insufficient documentation

## 2022-01-11 DIAGNOSIS — L0501 Pilonidal cyst with abscess: Secondary | ICD-10-CM | POA: Insufficient documentation

## 2022-01-15 ENCOUNTER — Encounter: Payer: Self-pay | Admitting: *Deleted

## 2022-01-15 ENCOUNTER — Ambulatory Visit: Payer: Medicaid Other | Attending: Obstetrics and Gynecology | Admitting: *Deleted

## 2022-01-15 ENCOUNTER — Ambulatory Visit (HOSPITAL_BASED_OUTPATIENT_CLINIC_OR_DEPARTMENT_OTHER): Payer: Medicaid Other

## 2022-01-15 VITALS — BP 108/76 | HR 88

## 2022-01-15 DIAGNOSIS — O09292 Supervision of pregnancy with other poor reproductive or obstetric history, second trimester: Secondary | ICD-10-CM | POA: Insufficient documentation

## 2022-01-15 DIAGNOSIS — O0990 Supervision of high risk pregnancy, unspecified, unspecified trimester: Secondary | ICD-10-CM

## 2022-01-15 DIAGNOSIS — R45851 Suicidal ideations: Secondary | ICD-10-CM

## 2022-01-15 DIAGNOSIS — Z363 Encounter for antenatal screening for malformations: Secondary | ICD-10-CM | POA: Diagnosis present

## 2022-01-15 DIAGNOSIS — O09899 Supervision of other high risk pregnancies, unspecified trimester: Secondary | ICD-10-CM | POA: Diagnosis not present

## 2022-01-15 DIAGNOSIS — O09212 Supervision of pregnancy with history of pre-term labor, second trimester: Secondary | ICD-10-CM | POA: Insufficient documentation

## 2022-01-15 DIAGNOSIS — Z3A18 18 weeks gestation of pregnancy: Secondary | ICD-10-CM | POA: Insufficient documentation

## 2022-01-18 ENCOUNTER — Ambulatory Visit: Payer: Medicaid Other

## 2022-01-18 ENCOUNTER — Encounter: Payer: Self-pay | Admitting: Family Medicine

## 2022-01-18 ENCOUNTER — Ambulatory Visit (INDEPENDENT_AMBULATORY_CARE_PROVIDER_SITE_OTHER): Payer: Medicaid Other | Admitting: Family Medicine

## 2022-01-18 ENCOUNTER — Other Ambulatory Visit: Payer: Self-pay

## 2022-01-18 VITALS — BP 109/70 | HR 86 | Wt 114.8 lb

## 2022-01-18 DIAGNOSIS — O09892 Supervision of other high risk pregnancies, second trimester: Secondary | ICD-10-CM

## 2022-01-18 DIAGNOSIS — O0990 Supervision of high risk pregnancy, unspecified, unspecified trimester: Secondary | ICD-10-CM | POA: Insufficient documentation

## 2022-01-18 DIAGNOSIS — O0992 Supervision of high risk pregnancy, unspecified, second trimester: Secondary | ICD-10-CM

## 2022-01-18 DIAGNOSIS — B009 Herpesviral infection, unspecified: Secondary | ICD-10-CM

## 2022-01-18 DIAGNOSIS — Z3A19 19 weeks gestation of pregnancy: Secondary | ICD-10-CM

## 2022-01-18 DIAGNOSIS — Z8759 Personal history of other complications of pregnancy, childbirth and the puerperium: Secondary | ICD-10-CM

## 2022-01-18 MED ORDER — PRENATAL 27-1 MG PO TABS: 1.0000 | ORAL_TABLET | Freq: Every day | ORAL | 10 refills | Status: DC

## 2022-01-18 NOTE — Assessment & Plan Note (Signed)
  Nursing Staff Provider  Office Location   Dating    [facility] Model [ X] Traditional [ ]  Centering [ ]  Mom-Baby Dyad    Language  English Anatomy    Flu Vaccine   Genetic/Carrier Screen  NIPS:    AFP:    Horizon:  TDaP Vaccine    Hgb A1C or  GTT Early  Third trimester   COVID Vaccine    LAB RESULTS   Rhogam   Blood Type --/--/O POS (10/12 0600)   Baby Feeding Plan Bottle Antibody NEG (10/12 0600)  Contraception OCP Rubella 3.21 (07/22 2009)  Circumcision Yes RPR NON REACTIVE (10/12 0810)   Pediatrician  [facility] HBsAg NON REACTIVE (07/22 2009)   Support Person FOB HCVAb   Prenatal Classes  HIV Non Reactive (08/24 0841)     BTL Consent  GBS NEGATIVE/-- (10/12 0621) (For PCN allergy, check sensitivities)   VBAC Consent  Pap        DME Rx [ X] BP cuff [ ]  Weight Scale Waterbirth  [ ]  Class [ ]  Consent [ ]  CNM visit  PHQ9 & GAD7 [ X] new OB [  ] 28 weeks  [  ] 36 weeks Induction  [ ]  Orders Entered [ ] Foley Y/N

## 2022-01-19 LAB — AFP, SERUM, OPEN SPINA BIFIDA
AFP MoM: 1.44
AFP Value: 99.2 ng/mL
Gest. Age on Collection Date: 19.1 weeks
Maternal Age At EDD: 26.6 yr
OSBR Risk 1 IN: 6441
Test Results:: NEGATIVE
Weight: 114 [lb_av]

## 2022-01-19 LAB — CBC/D/PLT+RPR+RH+ABO+RUBIGG...
Antibody Screen: NEGATIVE
Basophils Absolute: 0 10*3/uL (ref 0.0–0.2)
Basos: 0 %
EOS (ABSOLUTE): 0 10*3/uL (ref 0.0–0.4)
Eos: 1 %
HCV Ab: NONREACTIVE
HIV Screen 4th Generation wRfx: NONREACTIVE
Hematocrit: 30.3 % — ABNORMAL LOW (ref 34.0–46.6)
Hemoglobin: 9.8 g/dL — ABNORMAL LOW (ref 11.1–15.9)
Hepatitis B Surface Ag: NEGATIVE
Immature Grans (Abs): 0.1 10*3/uL (ref 0.0–0.1)
Immature Granulocytes: 1 %
Lymphocytes Absolute: 1.6 10*3/uL (ref 0.7–3.1)
Lymphs: 22 %
MCH: 28 pg (ref 26.6–33.0)
MCHC: 32.3 g/dL (ref 31.5–35.7)
MCV: 87 fL (ref 79–97)
Monocytes Absolute: 0.3 10*3/uL (ref 0.1–0.9)
Monocytes: 4 %
Neutrophils Absolute: 5.2 10*3/uL (ref 1.4–7.0)
Neutrophils: 72 %
Platelets: 315 10*3/uL (ref 150–450)
RBC: 3.5 x10E6/uL — ABNORMAL LOW (ref 3.77–5.28)
RDW: 13.4 % (ref 11.7–15.4)
RPR Ser Ql: NONREACTIVE
Rh Factor: POSITIVE
Rubella Antibodies, IGG: 2.96 index (ref >0.99)
WBC: 7.2 10*3/uL (ref 3.4–10.8)

## 2022-01-19 LAB — HCV INTERPRETATION

## 2022-01-19 LAB — COMPREHENSIVE METABOLIC PANEL
ALT: 12 IU/L (ref 0–32)
AST: 10 IU/L (ref 0–40)
Albumin/Globulin Ratio: 1.1 — ABNORMAL LOW (ref 1.2–2.2)
Albumin: 3.5 g/dL — ABNORMAL LOW (ref 3.9–5.0)
Alkaline Phosphatase: 53 IU/L (ref 44–121)
BUN/Creatinine Ratio: 26 — ABNORMAL HIGH (ref 9–23)
BUN: 12 mg/dL (ref 6–20)
Bilirubin Total: 0.5 mg/dL (ref 0.0–1.2)
CO2: 19 mmol/L — ABNORMAL LOW (ref 20–29)
Calcium: 8.5 mg/dL — ABNORMAL LOW (ref 8.7–10.2)
Chloride: 103 mmol/L (ref 96–106)
Creatinine, Ser: 0.46 mg/dL — ABNORMAL LOW (ref 0.57–1.00)
Globulin, Total: 3.1 g/dL (ref 1.5–4.5)
Glucose: 70 mg/dL (ref 70–99)
Potassium: 4.3 mmol/L (ref 3.5–5.2)
Sodium: 136 mmol/L (ref 134–144)
Total Protein: 6.6 g/dL (ref 6.0–8.5)
eGFR: 135 mL/min/{1.73_m2} (ref >59)

## 2022-01-19 LAB — HEMOGLOBIN A1C
Est. average glucose Bld gHb Est-mCnc: 80 mg/dL
Hgb A1c MFr Bld: 4.4 % — ABNORMAL LOW (ref 4.8–5.6)

## 2022-01-19 LAB — TSH: TSH: 1.91 u[IU]/mL (ref 0.450–4.500)

## 2022-01-20 ENCOUNTER — Encounter: Payer: Self-pay | Admitting: Family Medicine

## 2022-01-20 LAB — PROTEIN / CREATININE RATIO, URINE
Creatinine, Urine: 182.5 mg/dL
Protein, Ur: 17.1 mg/dL
Protein/Creat Ratio: 94 mg/g creat (ref 0–200)

## 2022-01-20 LAB — URINE CULTURE, OB REFLEX

## 2022-01-20 LAB — CULTURE, OB URINE

## 2022-01-20 MED ORDER — ASPIRIN 81 MG PO CHEW: 81.0000 mg | CHEWABLE_TABLET | Freq: Every day | ORAL | 3 refills | Status: DC

## 2022-01-20 NOTE — Progress Notes (Signed)
Subjective:   Kristin Bruce is a 26 y.o. [phone] at 88w3dby LMP, early ultrasound being seen today for her first obstetrical visit.  Her obstetrical history is significant for  preterm birth . Patient does not intend to breast feed. Pregnancy history fully reviewed.  Patient reports no complaints.  HISTORY: OB History  Gravida Para Term Preterm AB Living  '5 3 1 2 1 3  ' SAB IAB Ectopic Multiple Live Births  1 0 0 0 3    # Outcome Date GA Lbr Len/2nd Weight Sex Delivery Anes PTL Lv  5 Current           4 Preterm 04/29/21 329w3d9:07 / 00:29 5 lb 14.2 oz (2.671 kg) F Vag-Spont EPI  LIV     Birth Comments: WNL     Name: JOKRISTEEN, LANTZ   Apgar1: 8  Apgar5: 9  3 SAB 08/12/20 6w43w6d      2 Preterm 12/29/17 36w25w6dlb 7 oz (2.92 kg) F Vag-Spont EPI N LIV  1 Term 03/17/16 37w151w1d8 / 03:09 7 lb 2.3 oz (3.24 kg) F Vag-Spont EPI  LIV     Name: XXXJOTIFFINEY, SPARROWApgar1: 9  Apgar5: 9   Last pap smear was  11/2020 and was normal Past Medical History:  Diagnosis Date   Adjustment disorder with depressed mood 11/22/2015   Anemia    Anxiety    Depression    GBS (group B streptococcus) infection    History of preterm delivery, currently pregnant 11/18/2021   HSV (herpes simplex virus) anogenital infection    Suicidal thoughts    during 1st pregnancy   Wears glasses    Past Surgical History:  Procedure Laterality Date   INCISION AND DRAINAGE ABSCESS N/A 05/13/2018   Procedure: INCISION AND DRAINAGE, PILONIDAL  ABSCESS;  Surgeon: WyattJudeth Horn  [place] [facility] [place] [place];  Laterality: N/A;   INCISION AND DRAINAGE PERIRECTAL ABSCESS N/A 04/01/2018   Procedure: IRRIGATION AND DEBRIDEMENT PERIRECTAL ABSCESS;  Surgeon: ThompGeorganna Skeans  [place] [place] [place];  Laterality: N/A;   PILONIDAL CYST EXCISION N/A 07/20/2018   Procedure: EXCISION OF PILONIDAL DISEASE;  Surgeon: WhiteIleana Roup  [place]  General;  Laterality: N/A;   Family History  Problem Relation Age of Onset   Diabetes Mother    Hypertension Mother    Heart failure Mother    Diabetes Maternal Grandfather    Social History   Tobacco Use   Smoking status: Never   Smokeless tobacco: Never  Vaping Use   Vaping Use: Never used  Substance Use Topics   Alcohol use: Not Currently    Comment: not since pregnancy   Drug use: Not Currently    Types: Marijuana    Comment: no using for 2 months   No Known Allergies Current Outpatient Medications on File Prior to Visit  Medication Sig Dispense Refill   acetaminophen (TYLENOL) 500 MG tablet Take 1,000 mg by mouth every 6 (six) hours as needed for moderate pain. (Patient not taking: Reported on 01/18/2022)     Blood Pressure Monitoring (BLOOD PRESSURE KIT) DEVI 1 Device by Does not apply route as needed. (Patient not taking: Reported on 11/23/2021) 1 each 0   Misc. Devices (GOJJI WEIGHT SCALE) MISC 1 Device by Does not apply route as needed. (Patient not taking: Reported on 11/23/2021) 1 each 0   [  DISCONTINUED] Prenatal Vit-Fe Fumarate-FA (PREPLUS) 27-1 MG TABS Take 1 tablet by mouth daily. (Patient not taking: Reported on 11/23/2021) 90 tablet 2   No current facility-administered medications on file prior to visit.     Exam   Vitals:   01/18/22 1621  BP: 109/70  Pulse: 86  Weight: 114 lb 12.8 oz (52.1 kg)   Fetal Heart Rate (bpm): 141  System: General: well-developed, well-nourished female in no acute distress   Skin: normal coloration and turgor, no rashes   Neurologic: oriented, normal, negative, normal mood   Extremities: normal strength, tone, and muscle mass, ROM of all joints is normal   HEENT PERRLA, extraocular movement intact and sclera clear, anicteric   Mouth/Teeth mucous membranes moist, pharynx normal without lesions and dental hygiene good   Neck supple and no masses   Cardiovascular: regular rate and rhythm   Respiratory:  no respiratory distress,  normal breath sounds   Abdomen: soft, non-tender; bowel sounds normal; no masses,  no organomegaly     Assessment:   Pregnancy: [id number] Patient Active Problem List   Diagnosis Date Noted   Pregnancy, supervision, high-risk 01/18/2022   History of preterm delivery, currently pregnant in second trimester 01/11/2022   History of pilonidal abscess 01/11/2022   Supervision of high risk pregnancy, antepartum 11/18/2021   Suicidal thoughts (2019)    Adjustment disorder with mixed anxiety and depressed mood 11/02/2021   Anxiety state 11/02/2021   Alpha thalassemia silent carrier 12/09/2020   Migraine 10/30/2020   History of pre-eclampsia 09/30/2020   Herpes simplex type 1 infection 06/09/2017     Plan:  1. Supervision of high risk pregnancy, antepartum New OB labs - CBC/D/Plt+RPR+Rh+ABO+RubIgG... - Culture, OB Urine - GC/Chlamydia probe amp ([facility])not at [facility] - HgB A1c - Panorama Prenatal Test Full Panel - AFP, Serum, Open Spina Bifida  2. Herpes simplex type 1 infection Will need PPX at 34-36 weeks  3. History of pre-eclampsia Baseline labs Begin Baby ASA - Comprehensive metabolic panel - TSH - Protein / creatinine ratio, urine  4. History of preterm delivery, currently pregnant in second trimester Discussed impact on desire for WB.   Initial labs drawn. Continue prenatal vitamins. Genetic Screening discussed, NIPS: ordered. Ultrasound discussed; fetal anatomic survey: ordered. Problem list reviewed and updated. The nature of Outlook with multiple MDs and other Advanced Practice Providers was explained to patient; also emphasized that residents, students are part of our team. Routine obstetric precautions reviewed. Return in 4 weeks (on 02/15/2022).

## 2022-01-24 LAB — PANORAMA PRENATAL TEST FULL PANEL:PANORAMA TEST PLUS 5 ADDITIONAL MICRODELETIONS: FETAL FRACTION: 10.5

## 2022-01-25 ENCOUNTER — Encounter: Payer: Self-pay | Admitting: *Deleted

## 2022-01-26 ENCOUNTER — Encounter: Payer: Self-pay | Admitting: Cardiology

## 2022-02-15 ENCOUNTER — Ambulatory Visit: Payer: Medicaid Other | Admitting: Cardiology

## 2022-02-18 ENCOUNTER — Encounter: Payer: Self-pay | Admitting: Family Medicine

## 2022-02-18 ENCOUNTER — Encounter: Payer: Medicaid Other | Admitting: Family Medicine

## 2022-02-18 NOTE — Progress Notes (Signed)
Patient did not keep appointment today. She will be called to reschedule.  

## 2022-03-01 ENCOUNTER — Ambulatory Visit (INDEPENDENT_AMBULATORY_CARE_PROVIDER_SITE_OTHER): Payer: Medicaid Other | Admitting: Family Medicine

## 2022-03-01 ENCOUNTER — Other Ambulatory Visit: Payer: Self-pay

## 2022-03-01 VITALS — BP 101/68 | HR 82 | Wt 124.0 lb

## 2022-03-01 DIAGNOSIS — O09892 Supervision of other high risk pregnancies, second trimester: Secondary | ICD-10-CM

## 2022-03-01 DIAGNOSIS — L732 Hidradenitis suppurativa: Secondary | ICD-10-CM

## 2022-03-01 DIAGNOSIS — O0990 Supervision of high risk pregnancy, unspecified, unspecified trimester: Secondary | ICD-10-CM

## 2022-03-01 DIAGNOSIS — B009 Herpesviral infection, unspecified: Secondary | ICD-10-CM

## 2022-03-01 DIAGNOSIS — D509 Iron deficiency anemia, unspecified: Secondary | ICD-10-CM

## 2022-03-01 DIAGNOSIS — O99019 Anemia complicating pregnancy, unspecified trimester: Secondary | ICD-10-CM

## 2022-03-01 DIAGNOSIS — Z8759 Personal history of other complications of pregnancy, childbirth and the puerperium: Secondary | ICD-10-CM

## 2022-03-01 MED ORDER — IRON (FERROUS SULFATE) 325 (65 FE) MG PO TABS
1.0000 | ORAL_TABLET | ORAL | 3 refills | Status: DC
Start: 2022-03-01 — End: 2022-06-02

## 2022-03-01 NOTE — Progress Notes (Unsigned)
   PRENATAL VISIT NOTE  Subjective:  Kristin Bruce is a 26 y.o. [phone] at [redacted]w[redacted]d being seen today for ongoing prenatal care.  She is currently monitored for the following issues for this {Blank single:19197::"high-risk","low-risk"} pregnancy and has Herpes simplex type 1 infection; History of pre-eclampsia; Migraine; Alpha thalassemia silent carrier; Adjustment disorder with mixed anxiety and depressed mood; Anxiety state; Supervision of high risk pregnancy, antepartum; Suicidal thoughts (2019); History of preterm delivery, currently pregnant in second trimester; and History of pilonidal abscess on their problem list.  Patient reports {sx:14538}.  Contractions: Not present.  .  Movement: Present. Denies leaking of fluid.   The following portions of the patient's history were reviewed and updated as appropriate: allergies, current medications, past family history, past medical history, past social history, past surgical history and problem list.   Objective:   Vitals:   03/01/22 1538  BP: 101/68  Pulse: 82  Weight: 124 lb (56.2 kg)    Fetal Status: Fetal Heart Rate (bpm): 147   Movement: Present     General:  Alert, oriented and cooperative. Patient is in no acute distress.  Skin: Skin is warm and dry. No rash noted.   Cardiovascular: Normal heart rate noted  Respiratory: Normal respiratory effort, no problems with respiration noted  Abdomen: Soft, gravid, appropriate for gestational age.  Pain/Pressure: Absent     Pelvic: {Blank single:19197::"Cervical exam performed in the presence of a chaperone","Cervical exam deferred"}        Extremities: Normal range of motion.     Mental Status: Normal mood and affect. Normal behavior. Normal judgment and thought content.   Assessment and Plan:  Pregnancy: [id number] at [redacted]w[redacted]d 1. Supervision of high risk pregnancy, antepartum ***  2. Herpes simplex type 1 infection ***  3. History of preterm delivery, currently pregnant in second  trimester ***  4. History of pre-eclampsia ***  5. Iron deficiency anemia during pregnancy ***  6. Hydradenitis ***  {Blank single:19197::"Term","Preterm"} labor symptoms and general obstetric precautions including but not limited to vaginal bleeding, contractions, leaking of fluid and fetal movement were reviewed in detail with the patient. Please refer to After Visit Summary for other counseling recommendations.   No follow-ups on file.  No future appointments.  Celedonio Savage, MD

## 2022-03-01 NOTE — Progress Notes (Unsigned)
Patient reports "lumps" underneath left arm that will "come and go". She informed me that she has been dealing with these lumps for months.

## 2022-03-01 NOTE — Patient Instructions (Addendum)
It was great seeing you today.  I am sorry you are having issues with the drainage in your armpit.  I think you have what is called hidradenitis suppurativa.  It does not look actively infected at this time but please keep an eye on and if it starts draining, gets warm to touch, if you are having fevers please be evaluated immediately.  For your pregnancy I want to start you on iron supplementation.  Please continue to take your prenatal vitamin as well.  I also want you to start taking daily 81 mg aspirin.  I will send a prescription for this to your pharmacy.  You will need to be seen in 3 weeks for your 28-week visit with lab work.  If you have any questions or concerns please call the clinic.  Hope you have a great afternoon!

## 2022-03-26 ENCOUNTER — Other Ambulatory Visit: Payer: Self-pay

## 2022-03-26 DIAGNOSIS — O0990 Supervision of high risk pregnancy, unspecified, unspecified trimester: Secondary | ICD-10-CM

## 2022-03-29 ENCOUNTER — Encounter: Payer: Self-pay | Admitting: Obstetrics and Gynecology

## 2022-03-29 ENCOUNTER — Other Ambulatory Visit: Payer: Medicaid Other

## 2022-03-31 ENCOUNTER — Encounter: Payer: Self-pay | Admitting: Obstetrics and Gynecology

## 2022-03-31 ENCOUNTER — Other Ambulatory Visit: Payer: Medicaid Other

## 2022-03-31 ENCOUNTER — Ambulatory Visit (INDEPENDENT_AMBULATORY_CARE_PROVIDER_SITE_OTHER): Payer: Medicaid Other | Admitting: Obstetrics and Gynecology

## 2022-03-31 VITALS — BP 108/68 | HR 90 | Wt 128.0 lb

## 2022-03-31 DIAGNOSIS — O09892 Supervision of other high risk pregnancies, second trimester: Secondary | ICD-10-CM

## 2022-03-31 DIAGNOSIS — Z3A29 29 weeks gestation of pregnancy: Secondary | ICD-10-CM

## 2022-03-31 DIAGNOSIS — O0992 Supervision of high risk pregnancy, unspecified, second trimester: Secondary | ICD-10-CM

## 2022-03-31 DIAGNOSIS — Z8759 Personal history of other complications of pregnancy, childbirth and the puerperium: Secondary | ICD-10-CM

## 2022-03-31 DIAGNOSIS — Z23 Encounter for immunization: Secondary | ICD-10-CM | POA: Diagnosis not present

## 2022-03-31 DIAGNOSIS — O0993 Supervision of high risk pregnancy, unspecified, third trimester: Secondary | ICD-10-CM

## 2022-03-31 DIAGNOSIS — M899 Disorder of bone, unspecified: Secondary | ICD-10-CM

## 2022-03-31 DIAGNOSIS — B009 Herpesviral infection, unspecified: Secondary | ICD-10-CM

## 2022-03-31 DIAGNOSIS — O0990 Supervision of high risk pregnancy, unspecified, unspecified trimester: Secondary | ICD-10-CM

## 2022-03-31 MED ORDER — ASPIRIN 81 MG PO CHEW: 81.0000 mg | CHEWABLE_TABLET | Freq: Every day | ORAL | 3 refills | Status: DC

## 2022-03-31 NOTE — Progress Notes (Signed)
   PRENATAL VISIT NOTE  Subjective:  Kristin Bruce is a 26 y.o. [phone] at [redacted]w[redacted]d being seen today for ongoing prenatal care.  She is currently monitored for the following issues for this low-risk pregnancy and has Herpes simplex type 1 infection; History of pre-eclampsia; Migraine; Alpha thalassemia silent carrier; Adjustment disorder with mixed anxiety and depressed mood; Anxiety state; Supervision of high risk pregnancy, antepartum; Suicidal thoughts (2019); History of preterm delivery, currently pregnant in second trimester; and History of pilonidal abscess on their problem list.  Patient reports  pelvic pain and pain in her left elbow .  Contractions: Not present. Vag. Bleeding: None.  Movement: Present. Denies leaking of fluid.   The following portions of the patient's history were reviewed and updated as appropriate: allergies, current medications, past family history, past medical history, past social history, past surgical history and problem list.   Objective:   Vitals:   03/31/22 0852  BP: 108/68  Pulse: 90  Weight: 128 lb (58.1 kg)    Fetal Status: Fetal Heart Rate (bpm): 140   Movement: Present     General:  Alert, oriented and cooperative. Patient is in no acute distress.  Skin: Skin is warm and dry. No rash noted.   Cardiovascular: Normal heart rate noted  Respiratory: Normal respiratory effort, no problems with respiration noted  Abdomen: Soft, gravid, appropriate for gestational age.  Pain/Pressure: Present     Pelvic: Cervical exam deferred        Extremities: Normal range of motion.     Mental Status: Normal mood and affect. Normal behavior. Normal judgment and thought content.   Assessment and Plan:  Pregnancy: [id number] at [redacted]w[redacted]d 1. Supervision of high risk pregnancy, antepartum 28 wk labs today Reviewed plans for circ - pt would like him circumcised.  Declined flu shot but accepts tdap Has a h/o pubic symphysis separation with her last pregnancy. Offered PT -  she would like that.  Has left elbow superficial thrombophlebitis. Recommended warm compresses and aspirin.   2. History of preterm delivery, currently pregnant in second trimester   3. History of pre-eclampsia BP wnl today  4. Herpes simplex type 1 infection Supression at 32 wks given h/o PTB at 36w.   Preterm labor symptoms and general obstetric precautions including but not limited to vaginal bleeding, contractions, leaking of fluid and fetal movement were reviewed in detail with the patient. Please refer to After Visit Summary for other counseling recommendations.   Return in about 2 weeks (around 04/14/2022) for OB VISIT, MD or APP.  Future Appointments  Date Time Provider Department Center  03/31/2022 10:15 AM Milas Hock, MD [facility]    Milas Hock, MD

## 2022-04-01 LAB — CBC
Hematocrit: 28.4 % — ABNORMAL LOW (ref 34.0–46.6)
Hemoglobin: 8.7 g/dL — ABNORMAL LOW (ref 11.1–15.9)
MCH: 27.1 pg (ref 26.6–33.0)
MCHC: 30.6 g/dL — ABNORMAL LOW (ref 31.5–35.7)
MCV: 89 fL (ref 79–97)
Platelets: 283 10*3/uL (ref 150–450)
RBC: 3.21 x10E6/uL — ABNORMAL LOW (ref 3.77–5.28)
RDW: 13 % (ref 11.7–15.4)
WBC: 4.8 10*3/uL (ref 3.4–10.8)

## 2022-04-01 LAB — HIV ANTIBODY (ROUTINE TESTING W REFLEX): HIV Screen 4th Generation wRfx: NONREACTIVE

## 2022-04-01 LAB — GLUCOSE TOLERANCE, 2 HOURS W/ 1HR
Glucose, 1 hour: 106 mg/dL (ref 70–179)
Glucose, 2 hour: 83 mg/dL (ref 70–152)
Glucose, Fasting: 69 mg/dL — ABNORMAL LOW (ref 70–91)

## 2022-04-01 LAB — RPR: RPR Ser Ql: NONREACTIVE

## 2022-04-05 ENCOUNTER — Encounter: Payer: Self-pay | Admitting: *Deleted

## 2022-04-05 ENCOUNTER — Telehealth: Payer: Self-pay | Admitting: *Deleted

## 2022-04-05 NOTE — Telephone Encounter (Addendum)
-----   Message from Radene Gunning, MD sent at 04/02/2022  7:11 PM EDT ----- Pt is quite anemic. Can you confirm if she is taking both her prenatal vitamin and iron?  9/18  1209  Called pt and left message on her personal VM stating that I am following up due to recent test result showing significant anemia.  Dr. Damita Dunnings would like to know if she is taking her prenatal vitamin every Aleksa Catterton and iron tablet every other Jarelly Rinck. I stated that I will send a Mychart message to her and asked if she would respond with that information in order that Dr. Damita Dunnings can make plan of care recommendations.

## 2022-04-07 ENCOUNTER — Encounter: Payer: Self-pay | Admitting: Obstetrics and Gynecology

## 2022-04-14 ENCOUNTER — Other Ambulatory Visit: Payer: Self-pay

## 2022-04-14 ENCOUNTER — Telehealth: Payer: Self-pay | Admitting: Pharmacy Technician

## 2022-04-14 ENCOUNTER — Encounter: Payer: Self-pay | Admitting: Family Medicine

## 2022-04-14 ENCOUNTER — Other Ambulatory Visit: Payer: Self-pay | Admitting: Pharmacy Technician

## 2022-04-14 ENCOUNTER — Ambulatory Visit (INDEPENDENT_AMBULATORY_CARE_PROVIDER_SITE_OTHER): Payer: Medicaid Other | Admitting: Obstetrics and Gynecology

## 2022-04-14 VITALS — BP 105/75 | HR 90 | Wt 128.3 lb

## 2022-04-14 DIAGNOSIS — O0993 Supervision of high risk pregnancy, unspecified, third trimester: Secondary | ICD-10-CM

## 2022-04-14 DIAGNOSIS — O99013 Anemia complicating pregnancy, third trimester: Secondary | ICD-10-CM

## 2022-04-14 DIAGNOSIS — O0990 Supervision of high risk pregnancy, unspecified, unspecified trimester: Secondary | ICD-10-CM

## 2022-04-14 DIAGNOSIS — B009 Herpesviral infection, unspecified: Secondary | ICD-10-CM

## 2022-04-14 DIAGNOSIS — O09893 Supervision of other high risk pregnancies, third trimester: Secondary | ICD-10-CM

## 2022-04-14 DIAGNOSIS — Z3A31 31 weeks gestation of pregnancy: Secondary | ICD-10-CM

## 2022-04-14 DIAGNOSIS — Z8759 Personal history of other complications of pregnancy, childbirth and the puerperium: Secondary | ICD-10-CM

## 2022-04-14 DIAGNOSIS — D563 Thalassemia minor: Secondary | ICD-10-CM

## 2022-04-14 HISTORY — DX: Anemia complicating pregnancy, third trimester: O99.013

## 2022-04-14 NOTE — Telephone Encounter (Signed)
Dr. Elgie Congo, Juluis Rainier note:  Auth Submission: NO AUTH NEEDED Payer: [employer] Medication & CPT/J Code(s) submitted: Venofer (Iron Sucrose) J1756 Route of submission (phone, fax, portal):  Phone # [phone] Fax # Auth type: Buy/Bill Units/visits requested: x5 doses Reference number:  Approval from: 04/14/22 to 07/18/22

## 2022-04-14 NOTE — Progress Notes (Signed)
Pt reports cramping & pain in legs, she feels that the Iron pills are not helping., she wants to see if she can start infusions. Pt request letter for Job at [employer] to cut down hours from 8 to 6 if possible

## 2022-04-14 NOTE — Progress Notes (Signed)
   PRENATAL VISIT NOTE  Subjective:  Kristin Bruce is a 26 y.o. [phone] at [redacted]w[redacted]d being seen today for ongoing prenatal care.  She is currently monitored for the following issues for this high-risk pregnancy and has Herpes simplex type 1 infection; History of pre-eclampsia; Migraine; Alpha thalassemia silent carrier; Adjustment disorder with mixed anxiety and depressed mood; Anxiety state; Supervision of high risk pregnancy, antepartum; Suicidal thoughts (2019); Hx of preterm delivery, currently pregnant, third trimester; History of pilonidal abscess; and Anemia affecting pregnancy in third trimester on their problem list.  Patient doing well with no acute concerns today. She reports  increased fatigue and cramping in her bilateral legs .  Contractions: Not present. Vag. Bleeding: None.  Movement: Present. Denies leaking of fluid.   The following portions of the patient's history were reviewed and updated as appropriate: allergies, current medications, past family history, past medical history, past social history, past surgical history and problem list. Problem list updated.  Objective:   Vitals:   04/14/22 1040  BP: 105/75  Pulse: 90  Weight: 128 lb 4.8 oz (58.2 kg)    Fetal Status: Fetal Heart Rate (bpm): 148   Movement: Present     General:  Alert, oriented and cooperative. Patient is in no acute distress.  Skin: Skin is warm and dry. No rash noted.   Cardiovascular: Normal heart rate noted  Respiratory: Normal respiratory effort, no problems with respiration noted  Abdomen: Soft, gravid, appropriate for gestational age.  Pain/Pressure: Absent     Pelvic: Cervical exam deferred        Extremities: Normal range of motion.  Edema: None  Mental Status:  Normal mood and affect. Normal behavior. Normal judgment and thought content.   Assessment and Plan:  Pregnancy: [id number] at [redacted]w[redacted]d  1. [redacted] weeks gestation of pregnancy   2. History of pre-eclampsia BP WNL, no s/sx of  preeclampsia  3. Alpha thalassemia silent carrier   4. Supervision of high risk pregnancy, antepartum Continue routine prenatal care Discussed contraception Pt interested in contraceptive patch, also discussed IUD  5. Herpes simplex type 1 infection Prophylaxis at 36 weeks  6. Hx of preterm delivery, currently pregnant, third trimester No s/sx of preterm labor  7. Anemia affecting pregnancy in third trimester Will schedule iron infusion, orders placed  Preterm labor symptoms and general obstetric precautions including but not limited to vaginal bleeding, contractions, leaking of fluid and fetal movement were reviewed in detail with the patient.  Please refer to After Visit Summary for other counseling recommendations.   Return in about 2 weeks (around 04/28/2022) for [facility], in person.   Lynnda Shields, MD Faculty Attending Center for [facility]

## 2022-04-19 ENCOUNTER — Ambulatory Visit (INDEPENDENT_AMBULATORY_CARE_PROVIDER_SITE_OTHER): Payer: Medicaid Other

## 2022-04-19 VITALS — BP 97/61 | HR 103 | Temp 98.9°F | Resp 18 | Ht 60.0 in | Wt 126.8 lb

## 2022-04-19 DIAGNOSIS — O99013 Anemia complicating pregnancy, third trimester: Secondary | ICD-10-CM | POA: Diagnosis not present

## 2022-04-19 DIAGNOSIS — D508 Other iron deficiency anemias: Secondary | ICD-10-CM

## 2022-04-19 MED ORDER — ACETAMINOPHEN 325 MG PO TABS: 650.0000 mg | ORAL_TABLET | Freq: Once | ORAL | Status: DC

## 2022-04-19 MED ORDER — DIPHENHYDRAMINE HCL 25 MG PO CAPS: 25.0000 mg | ORAL_CAPSULE | Freq: Once | ORAL | Status: DC

## 2022-04-19 MED ORDER — SODIUM CHLORIDE 0.9 % IV SOLN
200.0000 mg | Freq: Once | INTRAVENOUS | Status: AC
  Administered 2022-04-19: 200 mg via INTRAVENOUS
  Filled 2022-04-19: qty 10

## 2022-04-19 NOTE — Progress Notes (Signed)
Diagnosis: Iron Deficiency Anemia  Provider:  Marshell Garfinkel MD  Procedure: Infusion  IV Type: Peripheral, IV Location: R Antecubital  Venofer (Iron Sucrose), Dose: 200 mg  Infusion Start Time: 1252  Infusion Stop Time: 7902  Post Infusion IV Care: Observation period completed and Peripheral IV Discontinued  Discharge: Condition: Good, Destination: Home . AVS provided to patient.   Performed by:  Koren Shiver, RN

## 2022-04-21 ENCOUNTER — Encounter: Payer: Self-pay | Admitting: Obstetrics and Gynecology

## 2022-04-21 ENCOUNTER — Ambulatory Visit: Payer: Medicaid Other

## 2022-04-21 ENCOUNTER — Other Ambulatory Visit: Payer: Self-pay

## 2022-04-21 MED ORDER — SODIUM CHLORIDE 0.9 % IV SOLN
200.0000 mg | Freq: Once | INTRAVENOUS | Status: DC
  Filled 2022-04-21: qty 10

## 2022-04-21 MED ORDER — DIPHENHYDRAMINE HCL 25 MG PO CAPS: 25.0000 mg | ORAL_CAPSULE | Freq: Once | ORAL | Status: DC

## 2022-04-21 MED ORDER — ACETAMINOPHEN 325 MG PO TABS: 650.0000 mg | ORAL_TABLET | Freq: Once | ORAL | Status: DC

## 2022-04-21 NOTE — Telephone Encounter (Signed)
Addressed via another encounter

## 2022-04-22 ENCOUNTER — Encounter: Payer: Self-pay | Admitting: Obstetrics and Gynecology

## 2022-04-23 ENCOUNTER — Ambulatory Visit (INDEPENDENT_AMBULATORY_CARE_PROVIDER_SITE_OTHER): Payer: Medicaid Other

## 2022-04-23 VITALS — BP 101/75 | HR 96 | Temp 98.1°F | Resp 18 | Ht 60.0 in | Wt 129.8 lb

## 2022-04-23 DIAGNOSIS — D508 Other iron deficiency anemias: Secondary | ICD-10-CM | POA: Diagnosis not present

## 2022-04-23 DIAGNOSIS — O99013 Anemia complicating pregnancy, third trimester: Secondary | ICD-10-CM | POA: Diagnosis not present

## 2022-04-23 MED ORDER — ACETAMINOPHEN 325 MG PO TABS: 650.0000 mg | ORAL_TABLET | Freq: Once | ORAL | Status: DC

## 2022-04-23 MED ORDER — DIPHENHYDRAMINE HCL 25 MG PO CAPS: 25.0000 mg | ORAL_CAPSULE | Freq: Once | ORAL | Status: DC

## 2022-04-23 MED ORDER — SODIUM CHLORIDE 0.9 % IV SOLN
200.0000 mg | Freq: Once | INTRAVENOUS | Status: AC
  Administered 2022-04-23: 200 mg via INTRAVENOUS
  Filled 2022-04-23: qty 10

## 2022-04-23 NOTE — Progress Notes (Signed)
Diagnosis: Iron Deficiency Anemia  Provider:  Marshell Garfinkel MD  Procedure: Infusion  IV Type: Peripheral, IV Location: R Antecubital  Venofer (Iron Sucrose), Dose: 200 mg  Infusion Start Time: 1004  Infusion Stop Time: 1025  Post Infusion IV Care: Peripheral IV Discontinued  Discharge: Condition: Good, Destination: Home . AVS provided to patient.   Performed by:  Arnoldo Morale, RN

## 2022-04-24 ENCOUNTER — Encounter (HOSPITAL_COMMUNITY): Payer: Self-pay | Admitting: Obstetrics and Gynecology

## 2022-04-24 ENCOUNTER — Inpatient Hospital Stay (HOSPITAL_COMMUNITY)
Admission: AD | Admit: 2022-04-24 | Discharge: 2022-04-24 | Disposition: A | Discharge status: Home / Self Care | Payer: Medicaid Other | Attending: Obstetrics and Gynecology | Admitting: Obstetrics and Gynecology

## 2022-04-24 DIAGNOSIS — R0602 Shortness of breath: Secondary | ICD-10-CM | POA: Insufficient documentation

## 2022-04-24 DIAGNOSIS — N760 Acute vaginitis: Secondary | ICD-10-CM

## 2022-04-24 DIAGNOSIS — R0683 Snoring: Secondary | ICD-10-CM | POA: Diagnosis not present

## 2022-04-24 DIAGNOSIS — B9689 Other specified bacterial agents as the cause of diseases classified elsewhere: Secondary | ICD-10-CM

## 2022-04-24 DIAGNOSIS — O23593 Infection of other part of genital tract in pregnancy, third trimester: Secondary | ICD-10-CM | POA: Diagnosis not present

## 2022-04-24 DIAGNOSIS — Z3A32 32 weeks gestation of pregnancy: Secondary | ICD-10-CM | POA: Insufficient documentation

## 2022-04-24 DIAGNOSIS — O26893 Other specified pregnancy related conditions, third trimester: Secondary | ICD-10-CM | POA: Diagnosis present

## 2022-04-24 LAB — URINALYSIS, ROUTINE W REFLEX MICROSCOPIC
Bacteria, UA: NONE SEEN
Bilirubin Urine: NEGATIVE
Glucose, UA: NEGATIVE mg/dL
Hgb urine dipstick: NEGATIVE
Ketones, ur: NEGATIVE mg/dL
Leukocytes,Ua: NEGATIVE
Nitrite: NEGATIVE
Protein, ur: 30 mg/dL — AB
Specific Gravity, Urine: 1.025 (ref 1.005–1.030)
pH: 6 (ref 5.0–8.0)

## 2022-04-24 LAB — COMPREHENSIVE METABOLIC PANEL
ALT: 12 U/L (ref 0–44)
AST: 17 U/L (ref 15–41)
Albumin: 2.4 g/dL — ABNORMAL LOW (ref 3.5–5.0)
Alkaline Phosphatase: 85 U/L (ref 38–126)
Anion gap: 5 (ref 5–15)
BUN: 8 mg/dL (ref 6–20)
CO2: 21 mmol/L — ABNORMAL LOW (ref 22–32)
Calcium: 8 mg/dL — ABNORMAL LOW (ref 8.9–10.3)
Chloride: 109 mmol/L (ref 98–111)
Creatinine, Ser: 0.39 mg/dL — ABNORMAL LOW (ref 0.44–1.00)
GFR, Estimated: >60 mL/min (ref >60)
Glucose, Bld: 73 mg/dL (ref 70–99)
Potassium: 3.8 mmol/L (ref 3.5–5.1)
Sodium: 135 mmol/L (ref 135–145)
Total Bilirubin: 0.9 mg/dL (ref 0.3–1.2)
Total Protein: 6.2 g/dL — ABNORMAL LOW (ref 6.5–8.1)

## 2022-04-24 LAB — WET PREP, GENITAL
Sperm: NONE SEEN
Trich, Wet Prep: NONE SEEN
WBC, Wet Prep HPF POC: <10 (ref <10)
Yeast Wet Prep HPF POC: NONE SEEN

## 2022-04-24 LAB — TSH: TSH: 1.418 u[IU]/mL (ref 0.350–4.500)

## 2022-04-24 MED ORDER — LACTATED RINGERS IV SOLN: INTRAVENOUS | Status: DC

## 2022-04-24 MED ORDER — M.V.I. ADULT IV INJ
Freq: Once | INTRAVENOUS | Status: AC
  Filled 2022-04-24: qty 1000

## 2022-04-24 MED ORDER — METRONIDAZOLE 500 MG PO TABS: 500.0000 mg | ORAL_TABLET | Freq: Two times a day (BID) | ORAL | 0 refills | Status: AC

## 2022-04-24 NOTE — Progress Notes (Signed)
Asked to review electrocardiogram performed on 26 year old woman without known cardiac illness now in her 34th week of pregnancy (G5 P3 Ab1).  She has complaints of unexplained shortness of breath which have been going on throughout most of the pregnancy.   She saw Dr. Harriet Masson in the cardio OB clinic in May.  She was scheduled for an echocardiogram in the past but this has not been performed.  Similarly a long-term arrhythmia monitor was recommended, but has not been performed.  She was also supposed to follow-up with Dr. Harriet Masson in the cardio OB clinic but the 02/15/2022 appointment was canceled (possibly because the recommended test had not been performed). ECG shows normal sinus rhythm with nonspecific T wave inversion leads V1-V4.  Similar nonspecific T wave changes have been seen waxing and waning from tracing to tracing as far back as April of this year. Recommend going ahead with the echocardiogram as previously ordered.

## 2022-04-24 NOTE — MAU Note (Signed)
.  Kristin Bruce is a 26 y.o. at [redacted]w[redacted]d here in MAU reporting: she has had SOB especially at night when she is sleep she wakes up gasping. Feels "woozy" when she wakes up in the morning.. Stated she had a Pregnancy pillow and sleeps mostly on her left side. But it is not helping.   Also stated tha t she had a lot of whitle milky vag discharge when she woke up today.  LMP:  Onset of complaint: a few weeks Pain score: 2 Vitals:   04/24/22 1231 04/24/22 1233  BP: 104/63   Pulse: (!) 101   Resp: 18   Temp: 98.2 F (36.8 C)   SpO2:  100%     FHT:148 Lab orders placed from triage:

## 2022-04-24 NOTE — MAU Provider Note (Signed)
History     CSN: [id number]  Arrival date and time: 04/24/22 1214   Event Date/Time   First Provider Initiated Contact with Patient 04/24/22 1307      No chief complaint on file.  HPI  [name] is a 26 y.o. female here in MAU with complaints of SOB. This is not a new problem. She started experiencing this around 20 weeks of pregnant. She reports concern for sleep apnea. Reports loud snoring at night. She wakes up very tired. She only snores when she is pregnant.  She would like to have a sleep study done, however was not sure if this is safe in pregnancy.  Reports she feels like she has something stuck in her throat. Reports an improvement in her anxiety levels now that she has her own apartment. She has no chest pain.   She was scheduled for an ECHO in May, however has had some transportation issues and was not able to make it.   Has not eaten anything this afternoon. Had a sausage biscuit at 0945. She has had no water today.   She has a history of Anemia and has received 2 venofer infusions.     OB History     Gravida  5   Para  3   Term  1   Preterm  2   AB  1   Living  3      SAB  1   IAB  0   Ectopic  0   Multiple  0   Live Births  3           Past Medical History:  Diagnosis Date   Adjustment disorder with depressed mood 11/22/2015   Anemia    Anxiety    Depression    GBS (group B streptococcus) infection    History of preterm delivery, currently pregnant 11/18/2021   HSV (herpes simplex virus) anogenital infection    Suicidal thoughts    during 1st pregnancy   Wears glasses     Past Surgical History:  Procedure Laterality Date   INCISION AND DRAINAGE ABSCESS N/A 05/13/2018   Procedure: INCISION AND DRAINAGE, PILONIDAL  ABSCESS;  Surgeon: Judeth Horn, MD;  Location: [facility];  Service: General;  Laterality: N/A;   INCISION AND DRAINAGE PERIRECTAL ABSCESS N/A 04/01/2018   Procedure: IRRIGATION AND DEBRIDEMENT PERIRECTAL ABSCESS;   Surgeon: Georganna Skeans, MD;  Location: [facility];  Service: General;  Laterality: N/A;   PILONIDAL CYST EXCISION N/A 07/20/2018   Procedure: EXCISION OF PILONIDAL DISEASE;  Surgeon: Ileana Roup, MD;  Location: [facility];  Service: General;  Laterality: N/A;    Family History  Problem Relation Age of Onset   Diabetes Mother    Hypertension Mother    Heart failure Mother    Diabetes Maternal Grandfather     Social History   Tobacco Use   Smoking status: Never   Smokeless tobacco: Never  Vaping Use   Vaping Use: Never used  Substance Use Topics   Alcohol use: Not Currently    Comment: not since pregnancy   Drug use: Not Currently    Types: Marijuana    Comment: no using for 2 months    Allergies: No Known Allergies  Medications Prior to Admission  Medication Sig Dispense Refill Last Dose   aspirin 81 MG chewable tablet Chew 1 tablet (81 mg total) by mouth daily. 90 tablet 3 Past Week   Prenatal 27-1 MG TABS Take 1 tablet  by mouth daily. 30 tablet 10 Past Week   acetaminophen (TYLENOL) 500 MG tablet Take 1,000 mg by mouth every 6 (six) hours as needed for moderate pain. (Patient not taking: Reported on 01/18/2022)      Blood Pressure Monitoring (BLOOD PRESSURE KIT) DEVI 1 Device by Does not apply route as needed. (Patient not taking: Reported on 11/23/2021) 1 each 0    Iron, Ferrous Sulfate, 325 (65 Fe) MG TABS Take 1 tablet by mouth every other day. 90 tablet 3    Misc. Devices (GOJJI WEIGHT SCALE) MISC 1 Device by Does not apply route as needed. (Patient not taking: Reported on 11/23/2021) 1 each 0    Results for orders placed or performed during the hospital encounter of 04/24/22 (from the past 48 hour(s))  Urinalysis, Routine w reflex microscopic Urine, Clean Catch     Status: Abnormal   Collection Time: 04/24/22 12:45 PM  Result Value Ref Range   Color, Urine AMBER (A) YELLOW    Comment: BIOCHEMICALS MAY BE AFFECTED BY COLOR   APPearance HAZY (A) CLEAR    Specific Gravity, Urine 1.025 1.005 - 1.030   pH 6.0 5.0 - 8.0   Glucose, UA NEGATIVE NEGATIVE mg/dL   Hgb urine dipstick NEGATIVE NEGATIVE   Bilirubin Urine NEGATIVE NEGATIVE   Ketones, ur NEGATIVE NEGATIVE mg/dL   Protein, ur 30 (A) NEGATIVE mg/dL   Nitrite NEGATIVE NEGATIVE   Leukocytes,Ua NEGATIVE NEGATIVE   RBC / HPF 0-5 0 - 5 RBC/hpf   WBC, UA 0-5 0 - 5 WBC/hpf   Bacteria, UA NONE SEEN NONE SEEN   Squamous Epithelial / LPF 11-20 0 - 5   Mucus PRESENT     Comment: Performed at [facility], [place] [address], [place], [place] [address]  Comprehensive metabolic panel     Status: Abnormal   Collection Time: 04/24/22  1:36 PM  Result Value Ref Range   Sodium 135 135 - 145 mmol/L   Potassium 3.8 3.5 - 5.1 mmol/L   Chloride 109 98 - 111 mmol/L   CO2 21 (L) 22 - 32 mmol/L   Glucose, Bld 73 70 - 99 mg/dL    Comment: Glucose reference range applies only to samples taken after fasting for at least 8 hours.   BUN 8 6 - 20 mg/dL   Creatinine, Ser 0.39 (L) 0.44 - 1.00 mg/dL   Calcium 8.0 (L) 8.9 - 10.3 mg/dL   Total Protein 6.2 (L) 6.5 - 8.1 g/dL   Albumin 2.4 (L) 3.5 - 5.0 g/dL   AST 17 15 - 41 U/L   ALT 12 0 - 44 U/L   Alkaline Phosphatase 85 38 - 126 U/L   Total Bilirubin 0.9 0.3 - 1.2 mg/dL   GFR, Estimated >60 >60 mL/min    Comment: (NOTE) Calculated using the CKD-EPI Creatinine Equation (2021)    Anion gap 5 5 - 15    Comment: Performed at [facility], [place] [address] [place], [place] [address]  TSH     Status: None   Collection Time: 04/24/22  1:36 PM  Result Value Ref Range   TSH 1.418 0.350 - 4.500 uIU/mL    Comment: Performed by a 3rd Generation assay with a functional sensitivity of <=0.01 uIU/mL. Performed at [facility], [place] [address], [place], [place] [address]   Wet prep, genital     Status: Abnormal   Collection Time: 04/24/22  2:24 PM   Specimen: Vaginal  Result Value Ref  Range   Yeast Wet Prep HPF POC NONE SEEN NONE SEEN   Trich, Wet  Prep NONE SEEN NONE SEEN   Clue Cells Wet Prep HPF POC PRESENT (A) NONE SEEN   WBC, Wet Prep HPF POC <10 <10   Sperm NONE SEEN     Comment: Performed at [facility] [address], [place], [place] [address]     Review of Systems  Constitutional:  Positive for fatigue.  Respiratory:  Positive for shortness of breath. Negative for chest tightness.   Cardiovascular:  Negative for chest pain, palpitations and leg swelling.  Neurological:  Positive for headaches. Negative for syncope and light-headedness.   Physical Exam   Blood pressure 94/64, pulse (!) 104, temperature 98.2 F (36.8 C), resp. rate 16, height 5' (1.524 m), weight 59 kg, last menstrual period 09/07/2021, SpO2 100 %, unknown if currently breastfeeding.  Physical Exam Constitutional:      General: She is not in acute distress.    Appearance: Normal appearance. She is not ill-appearing, toxic-appearing or diaphoretic.  Cardiovascular:     Rate and Rhythm: Normal rate.  Pulmonary:     Effort: Pulmonary effort is normal.  Genitourinary:    Comments: Dilation:  (internal os closed, external os 1) Exam by:: Noni Saupe NP  Wet prep and GC collected  Musculoskeletal:        General: Normal range of motion.     Cervical back: Normal range of motion.  Skin:    General: Skin is warm.  Neurological:     Mental Status: She is alert and oriented to person, place, and time.  Psychiatric:        Mood and Affect: Mood normal.    Fetal Tracing: Baseline: 140 BPM Variability: Moderate  Accelerations: 15x15 Decelerations: None Toco: Occasional   MAU Course  Procedures  MDM  Orthostatic vitals reassuring. TSH reassuring  LR bolus x 1, mild dehydration noted on urine Wet prep and GC collected EKG with T wave inversion, which is a change from her May EKG. Reviewed the EKG with on call cardiologist Dr. Sallyanne Kuster who recommends outpatient Echo, no reason to do this urgently in MAU. Reviewed with Dr. Ilda Basset who  is agreeable with the f/ u plan Message sent to Dr. Harriet Masson through epic to assist with f/u outpatient ECHO. The patient was notified of results and plan of care.   Assessment and Plan   A:  1. Bacterial vaginosis   2. Shortness of breath due to pregnancy in third trimester   3. [redacted] weeks gestation of pregnancy      P:  Dc home She could benefit from a sleep study to determine sleep apnea.  Strict return precautions Increase oral fluid intake Keep your infusion scheduled for tomorrow Plan for out patient ECHO  Majesti Gambrell, Artist Pais, NP 04/24/2022 3:46 PM

## 2022-04-26 ENCOUNTER — Ambulatory Visit: Payer: Medicaid Other

## 2022-04-26 LAB — GC/CHLAMYDIA PROBE AMP (~~LOC~~) NOT AT ARMC
Chlamydia: NEGATIVE
Comment: NEGATIVE
Comment: NORMAL
Neisseria Gonorrhea: NEGATIVE

## 2022-04-26 MED ORDER — DIPHENHYDRAMINE HCL 25 MG PO CAPS: 25.0000 mg | ORAL_CAPSULE | Freq: Once | ORAL | Status: DC

## 2022-04-26 MED ORDER — ACETAMINOPHEN 325 MG PO TABS: 650.0000 mg | ORAL_TABLET | Freq: Once | ORAL | Status: DC

## 2022-04-26 MED ORDER — SODIUM CHLORIDE 0.9 % IV SOLN
200.0000 mg | Freq: Once | INTRAVENOUS | Status: DC
  Filled 2022-04-26: qty 10

## 2022-05-03 ENCOUNTER — Other Ambulatory Visit: Payer: Self-pay

## 2022-05-03 ENCOUNTER — Ambulatory Visit (INDEPENDENT_AMBULATORY_CARE_PROVIDER_SITE_OTHER): Payer: Medicaid Other | Admitting: Obstetrics and Gynecology

## 2022-05-03 VITALS — BP 112/74 | HR 94 | Wt 131.8 lb

## 2022-05-03 DIAGNOSIS — B009 Herpesviral infection, unspecified: Secondary | ICD-10-CM

## 2022-05-03 DIAGNOSIS — O0993 Supervision of high risk pregnancy, unspecified, third trimester: Secondary | ICD-10-CM

## 2022-05-03 DIAGNOSIS — Z8759 Personal history of other complications of pregnancy, childbirth and the puerperium: Secondary | ICD-10-CM

## 2022-05-03 DIAGNOSIS — O0990 Supervision of high risk pregnancy, unspecified, unspecified trimester: Secondary | ICD-10-CM

## 2022-05-03 DIAGNOSIS — Z3A34 34 weeks gestation of pregnancy: Secondary | ICD-10-CM

## 2022-05-03 DIAGNOSIS — O09893 Supervision of other high risk pregnancies, third trimester: Secondary | ICD-10-CM

## 2022-05-03 DIAGNOSIS — D563 Thalassemia minor: Secondary | ICD-10-CM

## 2022-05-03 NOTE — Progress Notes (Signed)
   PRENATAL VISIT NOTE  Subjective:  Kristin Bruce is a 26 y.o. [phone] at [redacted]w[redacted]d being seen today for ongoing prenatal care.  She is currently monitored for the following issues for this high-risk pregnancy and has Herpes simplex type 1 infection; History of pre-eclampsia; Migraine; Alpha thalassemia silent carrier; Adjustment disorder with mixed anxiety and depressed mood; Anxiety state; Supervision of high risk pregnancy, antepartum; Suicidal thoughts (2019); Hx of preterm delivery, currently pregnant, third trimester; History of pilonidal abscess; and Anemia affecting pregnancy in third trimester on their problem list.  Patient doing well with no acute concerns today. She reports  pelvic pressure .  Contractions: Irritability. Vag. Bleeding: None.  Movement: Present. Denies leaking of fluid.   Discussed sleep apnea with patient.  This would not be a reason for early delivery.  Suggest sleep study after delivery.  The following portions of the patient's history were reviewed and updated as appropriate: allergies, current medications, past family history, past medical history, past social history, past surgical history and problem list. Problem list updated.  Objective:   Vitals:   05/03/22 1628  BP: 112/74  Pulse: 94  Weight: 131 lb 12.8 oz (59.8 kg)    Fetal Status: Fetal Heart Rate (bpm): 153 Fundal Height: 34 cm Movement: Present     General:  Alert, oriented and cooperative. Patient is in no acute distress.  Skin: Skin is warm and dry. No rash noted.   Cardiovascular: Normal heart rate noted  Respiratory: Normal respiratory effort, no problems with respiration noted  Abdomen: Soft, gravid, appropriate for gestational age.  Pain/Pressure: Present     Pelvic: Cervical exam performed Dilation: Fingertip Effacement (%): 40 Station: Ballotable  Extremities: Normal range of motion.  Edema: Trace  Mental Status:  Normal mood and affect. Normal behavior. Normal judgment and thought  content.   Assessment and Plan:  Pregnancy: [id number] at 106w1d  1. [redacted] weeks gestation of pregnancy   2. Herpes simplex type 1 infection Pt states she has never had an outbreak and has had all her other deliveries vaginally without issue.  3. History of pre-eclampsia No s/sx of preeclampsia  4. Alpha thalassemia silent carrier   5. Supervision of high risk pregnancy, antepartum Continue routine prenatal care 36 week labs next visit  6. Hx of preterm delivery, currently pregnant, third trimester No s/sx of preterm labor  Preterm labor symptoms and general obstetric precautions including but not limited to vaginal bleeding, contractions, leaking of fluid and fetal movement were reviewed in detail with the patient.  Please refer to After Visit Summary for other counseling recommendations.   Return in about 2 weeks (around 05/17/2022) for ROB, in person, 36 weeks swabs.   Lynnda Shields, MD Faculty Attending Center for [facility]

## 2022-05-10 NOTE — Therapy (Deleted)
OUTPATIENT PHYSICAL THERAPY FEMALE PELVIC EVALUATION   Patient Name: Kristin Bruce MRN: [id number] DOB:1996/04/26, 26 y.o., female Today's Date: 05/10/2022    Past Medical History:  Diagnosis Date   Adjustment disorder with depressed mood 11/22/2015   Anemia    Anxiety    Depression    GBS (group B streptococcus) infection    History of preterm delivery, currently pregnant 11/18/2021   HSV (herpes simplex virus) anogenital infection    Suicidal thoughts    during 1st pregnancy   Wears glasses    Past Surgical History:  Procedure Laterality Date   INCISION AND DRAINAGE ABSCESS N/A 05/13/2018   Procedure: INCISION AND DRAINAGE, PILONIDAL  ABSCESS;  Surgeon: Jimmye Norman, MD;  [facility];  Service: General;  Laterality: N/A;   INCISION AND DRAINAGE PERIRECTAL ABSCESS N/A 04/01/2018   Procedure: IRRIGATION AND DEBRIDEMENT PERIRECTAL ABSCESS;  Surgeon: Violeta Gelinas, MD;  Location: [facility] OR;  Service: General;  Laterality: N/A;   PILONIDAL CYST EXCISION N/A 07/20/2018   Procedure: EXCISION OF PILONIDAL DISEASE;  Surgeon: Andria Meuse, MD;  Location: [facility];  Service: General;  Laterality: N/A;   Patient Active Problem List   Diagnosis Date Noted   Anemia affecting pregnancy in third trimester 04/14/2022   Hx of preterm delivery, currently pregnant, third trimester 01/11/2022   History of pilonidal abscess 01/11/2022   Supervision of high risk pregnancy, antepartum 11/18/2021   Suicidal thoughts (2019)    Adjustment disorder with mixed anxiety and depressed mood 11/02/2021   Anxiety state 11/02/2021   Alpha thalassemia silent carrier 12/09/2020   Migraine 10/30/2020   History of pre-eclampsia 09/30/2020   Herpes simplex type 1 infection 06/09/2017    PCP: none  REFERRING PROVIDER: Milas Hock, MD  REFERRING DIAG: M89.9 (ICD-10-CM) - Pubic bone pain  THERAPY DIAG:  No diagnosis found.  Rationale for Evaluation and Treatment  Rehabilitation  ONSET DATE: ***  SUBJECTIVE:                                                                                                                                                                                           SUBJECTIVE STATEMENT: *** Fluid intake: {Yes/No:304960894}   PAIN:  Are you having pain? {yes/no:20286} NPRS scale: ***/10 Pain location: {pelvic pain location:27098}  Pain type: {type:313116} Pain description: {PAIN DESCRIPTION:21022940}   Aggravating factors: *** Relieving factors: ***  PRECAUTIONS: {Therapy precautions:24002}  WEIGHT BEARING RESTRICTIONS: {Yes ***/No:24003}  FALLS:  Has patient fallen in last 6 months? {fallsyesno:27318}  LIVING ENVIRONMENT: Lives with: {OPRC lives with:25569::"lives with their family"} Lives in: {Lives in:25570} Stairs: {opstairs:27293} Has following equipment at home: {  Assistive devices:23999}  OCCUPATION: ***  PLOF: {PLOF:24004}  PATIENT GOALS: ***  PERTINENT HISTORY:  Has a h/o pubic symphysis separation with her last pregnancy.pilonidal cyst excision Sexual abuse: {Yes/No:304960894}  BOWEL MOVEMENT: Pain with bowel movement: {yes/no:20286} Type of bowel movement:{PT BM type:27100} Fully empty rectum: {Yes/No:304960894} Leakage: {Yes/No:304960894} Pads: {Yes/No:304960894} Fiber supplement: {Yes/No:304960894}  URINATION: Pain with urination: {yes/no:20286} Fully empty bladder: {Yes/No:304960894} Stream: {PT urination:27102} Urgency: {Yes/No:304960894} Frequency: *** Leakage: {PT leakage:27103} Pads: {Yes/No:304960894}  INTERCOURSE: Pain with intercourse: {pain with intercourse PA:27099} Ability to have vaginal penetration:  {Yes/No:304960894} Climax: *** Marinoff Scale: ***/3  PREGNANCY: Vaginal deliveries *** Tearing {Yes***/No:304960894} C-section deliveries *** Currently pregnant {Yes***/No:304960894}  PROLAPSE: {PT prolapse:27101}   OBJECTIVE:   DIAGNOSTIC FINDINGS:   ***  PATIENT SURVEYS:  {rehab surveys:24030}  PFIQ-7 ***  COGNITION: Overall cognitive status: {cognition:24006}     SENSATION: Light touch: {intact/deficits:24005} Proprioception: {intact/deficits:24005}  MUSCLE LENGTH: Hamstrings: Right *** deg; Left *** deg Thomas test: Right *** deg; Left *** deg  LUMBAR SPECIAL TESTS:  {lumbar special test:25242}  FUNCTIONAL TESTS:  {Functional tests:24029}  GAIT: Distance walked: *** Assistive device utilized: {Assistive devices:23999} Level of assistance: {Levels of assistance:24026} Comments: ***  POSTURE: {posture:25561}  PELVIC ALIGNMENT:  LUMBARAROM/PROM:  A/PROM A/PROM  eval  Flexion   Extension   Right lateral flexion   Left lateral flexion   Right rotation   Left rotation    (Blank rows = not tested)  LOWER EXTREMITY ROM:  {AROM/PROM:27142} ROM Right eval Left eval  Hip flexion    Hip extension    Hip abduction    Hip adduction    Hip internal rotation    Hip external rotation    Knee flexion    Knee extension    Ankle dorsiflexion    Ankle plantarflexion    Ankle inversion    Ankle eversion     (Blank rows = not tested)  LOWER EXTREMITY MMT:  MMT Right eval Left eval  Hip flexion    Hip extension    Hip abduction    Hip adduction    Hip internal rotation    Hip external rotation    Knee flexion    Knee extension    Ankle dorsiflexion    Ankle plantarflexion    Ankle inversion    Ankle eversion     PALPATION:   General  ***                External Perineal Exam ***                             Internal Pelvic Floor ***  Patient confirms identification and approves PT to assess internal pelvic floor and treatment {yes/no:20286}  PELVIC MMT:   MMT eval  Vaginal   Internal Anal Sphincter   External Anal Sphincter   Puborectalis   Diastasis Recti   (Blank rows = not tested)        TONE: ***  PROLAPSE: ***  TODAY'S TREATMENT:  DATE:  EVAL ***   PATIENT EDUCATION:  Education details: *** Person educated: {Person educated:25204} Education method: {Education Method:25205} Education comprehension: {Education Comprehension:25206}  HOME EXERCISE PROGRAM: ***  ASSESSMENT:  CLINICAL IMPRESSION: Patient is a *** y.o. *** who was seen today for physical therapy evaluation and treatment for ***.   OBJECTIVE IMPAIRMENTS: {opptimpairments:25111}.   ACTIVITY LIMITATIONS: {activitylimitations:27494}  PARTICIPATION LIMITATIONS: {participationrestrictions:25113}  PERSONAL FACTORS: {Personal factors:25162} are also affecting patient's functional outcome.   REHAB POTENTIAL: {rehabpotential:25112}  CLINICAL DECISION MAKING: {clinical decision making:25114}  EVALUATION COMPLEXITY: {Evaluation complexity:25115}   GOALS: Goals reviewed with patient? {yes/no:20286}  SHORT TERM GOALS: Target date: {follow up:25551}  *** Baseline: Goal status: {GOALSTATUS:25110}  2.  *** Baseline:  Goal status: {GOALSTATUS:25110}  3.  *** Baseline:  Goal status: {GOALSTATUS:25110}  4.  *** Baseline:  Goal status: {GOALSTATUS:25110}  5.  *** Baseline:  Goal status: {GOALSTATUS:25110}  6.  *** Baseline:  Goal status: {GOALSTATUS:25110}  LONG TERM GOALS: Target date: {follow up:25551}   *** Baseline:  Goal status: {GOALSTATUS:25110}  2.  *** Baseline:  Goal status: {GOALSTATUS:25110}  3.  *** Baseline:  Goal status: {GOALSTATUS:25110}  4.  *** Baseline:  Goal status: {GOALSTATUS:25110}  5.  *** Baseline:  Goal status: {GOALSTATUS:25110}  6.  *** Baseline:  Goal status: {GOALSTATUS:25110}  PLAN:  PT FREQUENCY: {rehab frequency:25116}  PT DURATION: {rehab duration:25117}  PLANNED INTERVENTIONS: {rehab planned interventions:25118::"Therapeutic exercises","Therapeutic activity","Neuromuscular re-education","Balance  training","Gait training","Patient/Family education","Self Care","Joint mobilization"}  PLAN FOR NEXT SESSION: ***   Sekou Zuckerman, PT 05/10/2022, 9:03 AM

## 2022-05-11 ENCOUNTER — Encounter: Payer: Medicaid Other | Attending: Obstetrics and Gynecology | Admitting: Physical Therapy

## 2022-05-17 ENCOUNTER — Encounter: Payer: Self-pay | Admitting: Obstetrics and Gynecology

## 2022-05-18 ENCOUNTER — Encounter: Payer: Self-pay | Admitting: General Practice

## 2022-05-20 ENCOUNTER — Other Ambulatory Visit (HOSPITAL_COMMUNITY)
Admission: RE | Admit: 2022-05-20 | Discharge: 2022-05-20 | Disposition: A | Discharge status: Home / Self Care | Payer: Medicaid Other | Source: Ambulatory Visit | Attending: Obstetrics and Gynecology | Admitting: Obstetrics and Gynecology

## 2022-05-20 ENCOUNTER — Other Ambulatory Visit: Payer: Self-pay

## 2022-05-20 ENCOUNTER — Ambulatory Visit (INDEPENDENT_AMBULATORY_CARE_PROVIDER_SITE_OTHER): Payer: Medicaid Other | Admitting: Obstetrics and Gynecology

## 2022-05-20 VITALS — BP 111/80 | HR 80 | Wt 133.5 lb

## 2022-05-20 DIAGNOSIS — Z8759 Personal history of other complications of pregnancy, childbirth and the puerperium: Secondary | ICD-10-CM

## 2022-05-20 DIAGNOSIS — O0993 Supervision of high risk pregnancy, unspecified, third trimester: Secondary | ICD-10-CM

## 2022-05-20 DIAGNOSIS — O0900 Supervision of pregnancy with history of infertility, unspecified trimester: Secondary | ICD-10-CM | POA: Insufficient documentation

## 2022-05-20 DIAGNOSIS — Z3A36 36 weeks gestation of pregnancy: Secondary | ICD-10-CM

## 2022-05-20 DIAGNOSIS — B009 Herpesviral infection, unspecified: Secondary | ICD-10-CM

## 2022-05-20 DIAGNOSIS — O0990 Supervision of high risk pregnancy, unspecified, unspecified trimester: Secondary | ICD-10-CM

## 2022-05-20 DIAGNOSIS — D563 Thalassemia minor: Secondary | ICD-10-CM

## 2022-05-20 DIAGNOSIS — O09893 Supervision of other high risk pregnancies, third trimester: Secondary | ICD-10-CM

## 2022-05-20 LAB — CBC
Hematocrit: 28.5 % — ABNORMAL LOW (ref 34.0–46.6)
Hemoglobin: 9 g/dL — ABNORMAL LOW (ref 11.1–15.9)
MCH: 26.9 pg (ref 26.6–33.0)
MCHC: 31.6 g/dL (ref 31.5–35.7)
MCV: 85 fL (ref 79–97)
Platelets: 281 10*3/uL (ref 150–450)
RBC: 3.35 x10E6/uL — ABNORMAL LOW (ref 3.77–5.28)
RDW: 14 % (ref 11.7–15.4)
WBC: 4.1 10*3/uL (ref 3.4–10.8)

## 2022-05-20 NOTE — Progress Notes (Signed)
   PRENATAL VISIT NOTE  Subjective:  Kristin Bruce is a 26 y.o. [phone] at [redacted]w[redacted]d being seen today for ongoing prenatal care.  She is currently monitored for the following issues for this low-risk pregnancy and has Herpes simplex type 1 infection; History of pre-eclampsia; Migraine; Alpha thalassemia silent carrier; Adjustment disorder with mixed anxiety and depressed mood; Anxiety state; Supervision of high risk pregnancy, antepartum; Suicidal thoughts (2019); Hx of preterm delivery, currently pregnant, third trimester; History of pilonidal abscess; and Anemia affecting pregnancy in third trimester on their problem list.  Patient doing well with no acute concerns today. She reports no complaints.  Contractions: Irritability. Vag. Bleeding: None.  Movement: Present. Denies leaking of fluid.   The following portions of the patient's history were reviewed and updated as appropriate: allergies, current medications, past family history, past medical history, past social history, past surgical history and problem list. Problem list updated.  Objective:   Vitals:   05/20/22 0845  BP: 111/80  Pulse: 80  Weight: 133 lb 8 oz (60.6 kg)    Fetal Status: Fetal Heart Rate (bpm): 138 Fundal Height: 36 cm Movement: Present     General:  Alert, oriented and cooperative. Patient is in no acute distress.  Skin: Skin is warm and dry. No rash noted.   Cardiovascular: Normal heart rate noted  Respiratory: Normal respiratory effort, no problems with respiration noted  Abdomen: Soft, gravid, appropriate for gestational age.  Pain/Pressure: Present     Pelvic: Cervical exam performed Dilation: 1 Effacement (%): 50 Station: Ballotable  Extremities: Normal range of motion.  Edema: Trace  Mental Status:  Normal mood and affect. Normal behavior. Normal judgment and thought content.   Assessment and Plan:  Pregnancy: [id number] at [redacted]w[redacted]d  1. Supervision of high risk pregnancy, antepartum Continue routine prenatal  care. Pt is considering elective IOL, discussed earliest would be at 39 weeks.  Suggest 40 weeks for elective due to somewhat unfavorable cervix Will recheck cbc, if pt has increased anemia, consider another iron infusion prior to delivery  - Culture, beta strep (group b only) - GC/Chlamydia probe amp ([place])not at [facility] - CBC  2. [redacted] weeks gestation of pregnancy   3. Hx of preterm delivery, currently pregnant, third trimester No s/sx of labor  4. History of pre-eclampsia BP WNL  5. Herpes simplex type 1 infection As per previous note, no hx of actual outbreak  6. Alpha thalassemia silent carrier   Preterm labor symptoms and general obstetric precautions including but not limited to vaginal bleeding, contractions, leaking of fluid and fetal movement were reviewed in detail with the patient.  Please refer to After Visit Summary for other counseling recommendations.   Return in about 1 week (around 05/27/2022) for ROB, in person.   Lynnda Shields, MD Faculty Attending Center for [facility]

## 2022-05-21 LAB — GC/CHLAMYDIA PROBE AMP (~~LOC~~) NOT AT ARMC
Chlamydia: NEGATIVE
Comment: NEGATIVE
Comment: NORMAL
Neisseria Gonorrhea: NEGATIVE

## 2022-05-24 LAB — CULTURE, BETA STREP (GROUP B ONLY): Strep Gp B Culture: NEGATIVE

## 2022-05-26 ENCOUNTER — Encounter: Payer: Self-pay | Admitting: Obstetrics and Gynecology

## 2022-05-28 ENCOUNTER — Encounter: Payer: Medicaid Other | Admitting: Obstetrics and Gynecology

## 2022-06-02 ENCOUNTER — Encounter: Payer: Self-pay | Admitting: Family Medicine

## 2022-06-02 ENCOUNTER — Other Ambulatory Visit: Payer: Self-pay | Admitting: Advanced Practice Midwife

## 2022-06-02 ENCOUNTER — Ambulatory Visit (INDEPENDENT_AMBULATORY_CARE_PROVIDER_SITE_OTHER): Payer: Medicaid Other | Admitting: Family Medicine

## 2022-06-02 VITALS — BP 112/74 | HR 91 | Wt 132.2 lb

## 2022-06-02 DIAGNOSIS — Z3A38 38 weeks gestation of pregnancy: Secondary | ICD-10-CM

## 2022-06-02 DIAGNOSIS — F411 Generalized anxiety disorder: Secondary | ICD-10-CM

## 2022-06-02 DIAGNOSIS — Z8759 Personal history of other complications of pregnancy, childbirth and the puerperium: Secondary | ICD-10-CM

## 2022-06-02 DIAGNOSIS — O0990 Supervision of high risk pregnancy, unspecified, unspecified trimester: Secondary | ICD-10-CM

## 2022-06-02 DIAGNOSIS — B009 Herpesviral infection, unspecified: Secondary | ICD-10-CM

## 2022-06-02 DIAGNOSIS — O99013 Anemia complicating pregnancy, third trimester: Secondary | ICD-10-CM

## 2022-06-02 NOTE — Progress Notes (Addendum)
   Subjective:  Kristin Bruce is a 26 y.o. [phone] at [redacted]w[redacted]d being seen today for ongoing prenatal care.  She is currently monitored for the following issues for this low-risk pregnancy and has Herpes simplex type 1 infection; History of pre-eclampsia; Migraine; Alpha thalassemia silent carrier; Adjustment disorder with mixed anxiety and depressed mood; Anxiety state; Supervision of high risk pregnancy, antepartum; Suicidal thoughts (2019); Hx of preterm delivery, currently pregnant, third trimester; History of pilonidal abscess; and Anemia affecting pregnancy in third trimester on their problem list.  Patient reports no complaints.  Contractions: Irritability. Vag. Bleeding: None.  Movement: Present. Denies leaking of fluid.   The following portions of the patient's history were reviewed and updated as appropriate: allergies, current medications, past family history, past medical history, past social history, past surgical history and problem list. Problem list updated.  Objective:   Vitals:   06/02/22 1054  BP: 112/74  Pulse: 91  Weight: 132 lb 3.2 oz (60 kg)    Fetal Status: Fetal Heart Rate (bpm): 148   Movement: Present  Presentation: Vertex  General:  Alert, oriented and cooperative. Patient is in no acute distress.  Skin: Skin is warm and dry. No rash noted.   Cardiovascular: Normal heart rate noted  Respiratory: Normal respiratory effort, no problems with respiration noted  Abdomen: Soft, gravid, appropriate for gestational age. Pain/Pressure: Present     Pelvic: Vag. Bleeding: None     Cervical exam performed Dilation: 1 Effacement (%): 70 Station: -3  Extremities: Normal range of motion.     Mental Status: Normal mood and affect. Normal behavior. Normal judgment and thought content.   Urinalysis:      Assessment and Plan:  Pregnancy: [id number] at [redacted]w[redacted]d  1. Supervision of high risk pregnancy, antepartum BP and FHR normal Cervix favorable for a multip Desires elective IOL,  scheduled for midnight at [redacted]w[redacted]d on 06/06/2022 Form faxed, orders placed  2. History of pre-eclampsia On ASA, normotensive  3. Herpes simplex type 1 infection No lesions seen  4. Anemia affecting pregnancy in third trimester Lab Results  Component Value Date   HGB 9.0 (L) 05/20/2022  S/p IV iron  5. Anxiety state Stable  Term labor symptoms and general obstetric precautions including but not limited to vaginal bleeding, contractions, leaking of fluid and fetal movement were reviewed in detail with the patient. Please refer to After Visit Summary for other counseling recommendations.  Return in 1 week (on 06/09/2022) for [facility], ob visit.   Venora Maples, MD

## 2022-06-02 NOTE — Patient Instructions (Signed)

## 2022-06-04 ENCOUNTER — Telehealth (HOSPITAL_COMMUNITY): Payer: Self-pay | Admitting: *Deleted

## 2022-06-04 ENCOUNTER — Encounter (HOSPITAL_COMMUNITY): Payer: Self-pay

## 2022-06-04 ENCOUNTER — Encounter: Payer: Self-pay | Admitting: Family Medicine

## 2022-06-04 NOTE — Telephone Encounter (Signed)
Preadmission screen  

## 2022-06-06 ENCOUNTER — Encounter (HOSPITAL_COMMUNITY): Payer: Self-pay | Admitting: Family Medicine

## 2022-06-06 ENCOUNTER — Other Ambulatory Visit: Payer: Self-pay

## 2022-06-06 ENCOUNTER — Inpatient Hospital Stay (HOSPITAL_COMMUNITY): Admission: RE | Admit: 2022-06-06 | Payer: Medicaid Other | Source: Ambulatory Visit

## 2022-06-06 ENCOUNTER — Encounter (HOSPITAL_COMMUNITY): Payer: Self-pay

## 2022-06-06 ENCOUNTER — Inpatient Hospital Stay (HOSPITAL_COMMUNITY)
Admission: RE | Admit: 2022-06-06 | Discharge: 2022-06-07 | DRG: 807 | Disposition: A | Discharge status: Home / Self Care | Payer: Medicaid Other | Attending: Family Medicine | Admitting: Family Medicine

## 2022-06-06 ENCOUNTER — Inpatient Hospital Stay (HOSPITAL_COMMUNITY): Payer: Medicaid Other | Admitting: Anesthesiology

## 2022-06-06 DIAGNOSIS — O99344 Other mental disorders complicating childbirth: Secondary | ICD-10-CM | POA: Diagnosis present

## 2022-06-06 DIAGNOSIS — D563 Thalassemia minor: Secondary | ICD-10-CM | POA: Diagnosis present

## 2022-06-06 DIAGNOSIS — O26893 Other specified pregnancy related conditions, third trimester: Secondary | ICD-10-CM | POA: Diagnosis present

## 2022-06-06 DIAGNOSIS — R45851 Suicidal ideations: Secondary | ICD-10-CM

## 2022-06-06 DIAGNOSIS — Z3A39 39 weeks gestation of pregnancy: Secondary | ICD-10-CM

## 2022-06-06 DIAGNOSIS — Z148 Genetic carrier of other disease: Secondary | ICD-10-CM | POA: Diagnosis not present

## 2022-06-06 DIAGNOSIS — O9902 Anemia complicating childbirth: Secondary | ICD-10-CM | POA: Diagnosis present

## 2022-06-06 DIAGNOSIS — Z3490 Encounter for supervision of normal pregnancy, unspecified, unspecified trimester: Secondary | ICD-10-CM | POA: Diagnosis present

## 2022-06-06 DIAGNOSIS — Z7982 Long term (current) use of aspirin: Secondary | ICD-10-CM

## 2022-06-06 DIAGNOSIS — F4323 Adjustment disorder with mixed anxiety and depressed mood: Secondary | ICD-10-CM | POA: Diagnosis present

## 2022-06-06 DIAGNOSIS — F411 Generalized anxiety disorder: Secondary | ICD-10-CM | POA: Diagnosis present

## 2022-06-06 DIAGNOSIS — O09893 Supervision of other high risk pregnancies, third trimester: Secondary | ICD-10-CM

## 2022-06-06 DIAGNOSIS — O99013 Anemia complicating pregnancy, third trimester: Secondary | ICD-10-CM | POA: Diagnosis present

## 2022-06-06 DIAGNOSIS — O0990 Supervision of high risk pregnancy, unspecified, unspecified trimester: Secondary | ICD-10-CM

## 2022-06-06 DIAGNOSIS — O9852 Other viral diseases complicating childbirth: Secondary | ICD-10-CM | POA: Diagnosis not present

## 2022-06-06 DIAGNOSIS — B009 Herpesviral infection, unspecified: Secondary | ICD-10-CM | POA: Diagnosis present

## 2022-06-06 DIAGNOSIS — Z8759 Personal history of other complications of pregnancy, childbirth and the puerperium: Secondary | ICD-10-CM

## 2022-06-06 HISTORY — DX: Encounter for supervision of normal pregnancy, unspecified, unspecified trimester: Z34.90

## 2022-06-06 LAB — TYPE AND SCREEN
ABO/RH(D): O POS
Antibody Screen: NEGATIVE

## 2022-06-06 LAB — CBC
HCT: 32.2 % — ABNORMAL LOW (ref 36.0–46.0)
Hemoglobin: 10.1 g/dL — ABNORMAL LOW (ref 12.0–15.0)
MCH: 26.9 pg (ref 26.0–34.0)
MCHC: 31.4 g/dL (ref 30.0–36.0)
MCV: 85.9 fL (ref 80.0–100.0)
Platelets: 276 10*3/uL (ref 150–400)
RBC: 3.75 MIL/uL — ABNORMAL LOW (ref 3.87–5.11)
RDW: 15.2 % (ref 11.5–15.5)
WBC: 5.4 10*3/uL (ref 4.0–10.5)
nRBC: 0 % (ref 0.0–0.2)

## 2022-06-06 LAB — RPR: RPR Ser Ql: NONREACTIVE

## 2022-06-06 MED ORDER — DIPHENHYDRAMINE HCL 50 MG/ML IJ SOLN: 12.5000 mg | INTRAMUSCULAR | Status: DC | PRN

## 2022-06-06 MED ORDER — PRENATAL MULTIVITAMIN CH
1.0000 | ORAL_TABLET | Freq: Every day | ORAL | Status: DC
  Administered 2022-06-06 – 2022-06-07 (×2): 1 via ORAL
  Filled 2022-06-06 (×2): qty 1

## 2022-06-06 MED ORDER — EPHEDRINE 5 MG/ML INJ: 10.0000 mg | INTRAVENOUS | Status: DC | PRN

## 2022-06-06 MED ORDER — SENNOSIDES-DOCUSATE SODIUM 8.6-50 MG PO TABS
2.0000 | ORAL_TABLET | Freq: Every day | ORAL | Status: DC
  Administered 2022-06-07: 2 via ORAL
  Filled 2022-06-06: qty 2

## 2022-06-06 MED ORDER — ZOLPIDEM TARTRATE 5 MG PO TABS: 5.0000 mg | ORAL_TABLET | Freq: Every evening | ORAL | Status: DC | PRN

## 2022-06-06 MED ORDER — PHENYLEPHRINE 80 MCG/ML (10ML) SYRINGE FOR IV PUSH (FOR BLOOD PRESSURE SUPPORT)
80.0000 ug | PREFILLED_SYRINGE | INTRAVENOUS | Status: DC | PRN
  Filled 2022-06-06: qty 10

## 2022-06-06 MED ORDER — ACETAMINOPHEN 325 MG PO TABS: 650.0000 mg | ORAL_TABLET | ORAL | Status: DC | PRN

## 2022-06-06 MED ORDER — OXYTOCIN BOLUS FROM INFUSION
333.0000 mL | Freq: Once | INTRAVENOUS | Status: AC
  Administered 2022-06-06: 333 mL via INTRAVENOUS

## 2022-06-06 MED ORDER — ONDANSETRON HCL 4 MG/2ML IJ SOLN: 4.0000 mg | INTRAMUSCULAR | Status: DC | PRN

## 2022-06-06 MED ORDER — FENTANYL CITRATE (PF) 100 MCG/2ML IJ SOLN
50.0000 ug | INTRAMUSCULAR | Status: DC | PRN
  Administered 2022-06-06: 50 ug via INTRAVENOUS
  Administered 2022-06-06: 100 ug via INTRAVENOUS
  Filled 2022-06-06 (×2): qty 2

## 2022-06-06 MED ORDER — LACTATED RINGERS IV SOLN: INTRAVENOUS | Status: DC

## 2022-06-06 MED ORDER — PHENYLEPHRINE 80 MCG/ML (10ML) SYRINGE FOR IV PUSH (FOR BLOOD PRESSURE SUPPORT)
80.0000 ug | PREFILLED_SYRINGE | INTRAVENOUS | Status: DC | PRN
  Administered 2022-06-06: 80 ug via INTRAVENOUS

## 2022-06-06 MED ORDER — DIBUCAINE (PERIANAL) 1 % EX OINT: 1.0000 | TOPICAL_OINTMENT | CUTANEOUS | Status: DC | PRN

## 2022-06-06 MED ORDER — DIPHENHYDRAMINE HCL 25 MG PO CAPS: 25.0000 mg | ORAL_CAPSULE | Freq: Four times a day (QID) | ORAL | Status: DC | PRN

## 2022-06-06 MED ORDER — OXYTOCIN-SODIUM CHLORIDE 30-0.9 UT/500ML-% IV SOLN
1.0000 m[IU]/min | INTRAVENOUS | Status: DC
  Administered 2022-06-06: 2 m[IU]/min via INTRAVENOUS
  Filled 2022-06-06: qty 500

## 2022-06-06 MED ORDER — BENZOCAINE-MENTHOL 20-0.5 % EX AERO
1.0000 | INHALATION_SPRAY | CUTANEOUS | Status: DC | PRN
  Administered 2022-06-06: 1 via TOPICAL
  Filled 2022-06-06: qty 56

## 2022-06-06 MED ORDER — LACTATED RINGERS IV SOLN: 500.0000 mL | INTRAVENOUS | Status: DC | PRN

## 2022-06-06 MED ORDER — ACETAMINOPHEN 325 MG PO TABS
650.0000 mg | ORAL_TABLET | ORAL | Status: DC | PRN
  Administered 2022-06-06 – 2022-06-07 (×4): 650 mg via ORAL
  Filled 2022-06-06 (×4): qty 2

## 2022-06-06 MED ORDER — LACTATED RINGERS IV SOLN: 500.0000 mL | Freq: Once | INTRAVENOUS | Status: DC

## 2022-06-06 MED ORDER — WITCH HAZEL-GLYCERIN EX PADS
1.0000 | MEDICATED_PAD | CUTANEOUS | Status: DC | PRN
  Administered 2022-06-06: 1 via TOPICAL

## 2022-06-06 MED ORDER — FENTANYL-BUPIVACAINE-NACL 0.5-0.125-0.9 MG/250ML-% EP SOLN
12.0000 mL/h | EPIDURAL | Status: DC | PRN
  Administered 2022-06-06: 12 mL/h via EPIDURAL
  Filled 2022-06-06: qty 250

## 2022-06-06 MED ORDER — ONDANSETRON HCL 4 MG/2ML IJ SOLN: 4.0000 mg | Freq: Four times a day (QID) | INTRAMUSCULAR | Status: DC | PRN

## 2022-06-06 MED ORDER — IBUPROFEN 600 MG PO TABS
600.0000 mg | ORAL_TABLET | Freq: Four times a day (QID) | ORAL | Status: DC
  Administered 2022-06-06 – 2022-06-07 (×4): 600 mg via ORAL
  Filled 2022-06-06 (×4): qty 1

## 2022-06-06 MED ORDER — OXYTOCIN-SODIUM CHLORIDE 30-0.9 UT/500ML-% IV SOLN: 2.5000 [IU]/h | INTRAVENOUS | Status: DC

## 2022-06-06 MED ORDER — SOD CITRATE-CITRIC ACID 500-334 MG/5ML PO SOLN: 30.0000 mL | ORAL | Status: DC | PRN

## 2022-06-06 MED ORDER — COCONUT OIL OIL: 1.0000 | TOPICAL_OIL | Status: DC | PRN

## 2022-06-06 MED ORDER — ONDANSETRON HCL 4 MG PO TABS: 4.0000 mg | ORAL_TABLET | ORAL | Status: DC | PRN

## 2022-06-06 MED ORDER — TERBUTALINE SULFATE 1 MG/ML IJ SOLN
0.2500 mg | Freq: Once | INTRAMUSCULAR | Status: AC | PRN
  Administered 2022-06-06: 0.25 mg via SUBCUTANEOUS
  Filled 2022-06-06: qty 1

## 2022-06-06 MED ORDER — FERROUS SULFATE 325 (65 FE) MG PO TABS
325.0000 mg | ORAL_TABLET | ORAL | Status: DC
  Administered 2022-06-07: 325 mg via ORAL
  Filled 2022-06-06: qty 1

## 2022-06-06 MED ORDER — OXYCODONE HCL 5 MG PO TABS
5.0000 mg | ORAL_TABLET | ORAL | Status: DC | PRN
  Administered 2022-06-06 – 2022-06-07 (×4): 5 mg via ORAL
  Filled 2022-06-06 (×4): qty 1

## 2022-06-06 MED ORDER — LIDOCAINE HCL (PF) 1 % IJ SOLN: 30.0000 mL | INTRAMUSCULAR | Status: DC | PRN

## 2022-06-06 MED ORDER — LIDOCAINE HCL (PF) 1 % IJ SOLN
INTRAMUSCULAR | Status: DC | PRN
  Administered 2022-06-06: 11 mL via EPIDURAL

## 2022-06-06 MED ORDER — SIMETHICONE 80 MG PO CHEW: 80.0000 mg | CHEWABLE_TABLET | ORAL | Status: DC | PRN

## 2022-06-06 MED ORDER — TETANUS-DIPHTH-ACELL PERTUSSIS 5-2.5-18.5 LF-MCG/0.5 IM SUSY: 0.5000 mL | PREFILLED_SYRINGE | Freq: Once | INTRAMUSCULAR | Status: DC

## 2022-06-06 NOTE — Discharge Summary (Signed)
Postpartum Discharge Summary  Date of Service updated-yes     Patient Name: Kristin Bruce DOB: 07-09-1996 MRN: [id number]  Date of admission: 06/06/2022 Delivery date:06/06/2022  Delivering provider: Renee Harder  Date of discharge: 06/07/2022  Admitting diagnosis: Encounter for elective induction of labor [Z34.90] Intrauterine pregnancy: [redacted]w[redacted]d    Secondary diagnosis:  Principal Problem:   Encounter for elective induction of labor Active Problems:   Herpes simplex type 1 infection   History of pre-eclampsia   Alpha thalassemia silent carrier   SVD (spontaneous vaginal delivery)   Adjustment disorder with mixed anxiety and depressed mood   Anxiety state   Supervision of high risk pregnancy, antepartum   Suicidal thoughts (2019)   Hx of preterm delivery, currently pregnant, third trimester   Anemia affecting pregnancy in third trimester  Additional problems: none    Discharge diagnosis: Term Pregnancy Delivered                                              Post partum procedures: none Augmentation: Pitocin Complications: None  Hospital course: Induction of Labor With Vaginal Delivery   26y.o. yo [phone] [id number] admitted to the hospital 06/06/2022 for induction of labor.  Indication for induction: Elective.  Patient had an labor course complicated by precipitous delivery Membrane Rupture Time/Date: 9:22 AM ,06/06/2022   Delivery Method:Vaginal, Spontaneous  Episiotomy: None  Lacerations:  None  Details of delivery can be found in separate delivery note.  Patient had a postpartum course uncomplicated. Patient is discharged home 06/07/22.  Newborn Data: Birth date:06/06/2022  Birth time:9:23 AM  Gender:Female  Living status:Living  Apgars:8 ,9  Weight:3260 g   Magnesium Sulfate received: No BMZ received: No Rhophylac:N/A MMR:N/A T-DaP:Given prenatally Flu: No Transfusion:No  Physical exam  Vitals:   06/06/22 1624 06/06/22 2037 06/07/22 0033  06/07/22 0503  BP: 114/84 114/79 122/87 106/80  Pulse:  70 72 60  Resp: _0 Temp: 98.6 F (37 C) 98.4 F (36.9 C) 97.8 F (36.6 C) 97.7 F (36.5 C)  TempSrc: Oral Oral Oral Oral  SpO2:  100% 97% 100%  Weight:      Height:       General: alert, cooperative, and no distress Lochia: appropriate Uterine Fundus: firm Incision: N/A DVT Evaluation: No evidence of DVT seen on physical exam. Negative Homan's sign. No cords or calf tenderness. No significant calf/ankle edema. Labs: Lab Results  Component Value Date   WBC 5.4 06/06/2022   HGB 10.1 (L) 06/06/2022   HCT 32.2 (L) 06/06/2022   MCV 85.9 06/06/2022   PLT 276 06/06/2022      Latest Ref Rng & Units 04/24/2022    1:36 PM  CMP  Glucose 70 - 99 mg/dL 73   BUN 6 - 20 mg/dL 8   Creatinine 0.44 - 1.00 mg/dL 0.39   Sodium 135 - 145 mmol/L 135   Potassium 3.5 - 5.1 mmol/L 3.8   Chloride 98 - 111 mmol/L 109   CO2 22 - 32 mmol/L 21   Calcium 8.9 - 10.3 mg/dL 8.0   Total Protein 6.5 - 8.1 g/dL 6.2   Total Bilirubin 0.3 - 1.2 mg/dL 0.9   Alkaline Phos 38 - 126 U/L 85   AST 15 - 41 U/L 17   ALT 0 - 44 U/L 12  Edinburgh Score:    06/06/2022   12:34 PM  Edinburgh Postnatal Depression Scale Screening Tool  I have been able to laugh and see the funny side of things. 0  I have looked forward with enjoyment to things. 0  I have blamed myself unnecessarily when things went wrong. 0  I have been anxious or worried for no good reason. 0  I have felt scared or panicky for no good reason. 0  Things have been getting on top of me. 0  I have been so unhappy that I have had difficulty sleeping. 0  I have felt sad or miserable. 0  I have been so unhappy that I have been crying. 0  The thought of harming myself has occurred to me. 0  Edinburgh Postnatal Depression Scale Total 0     After visit meds:  Allergies as of 06/07/2022   No Known Allergies      Medication List     STOP taking these medications     aspirin 81 MG chewable tablet       TAKE these medications    acetaminophen 500 MG tablet Commonly known as: TYLENOL Take 500-1,000 mg by mouth every 6 (six) hours as needed for mild pain, headache or fever.   ibuprofen 600 MG tablet Commonly known as: ADVIL Take 1 tablet (600 mg total) by mouth every 6 (six) hours.   oxyCODONE 5 MG immediate release tablet Commonly known as: Oxy IR/ROXICODONE Take 1 tablet (5 mg total) by mouth every 4 (four) hours as needed for moderate pain.   Prenatal 27-1 MG Tabs Take 1 tablet by mouth daily.         Discharge home in stable condition Infant Feeding: Bottle Infant Disposition:home with mother Discharge instruction: per After Visit Summary and Postpartum booklet. Activity: Advance as tolerated. Pelvic rest for 6 weeks.  Diet: routine diet Future Appointments:No future appointments. Follow up Visit:   Please schedule this patient for a In person postpartum visit in 6 weeks with the following provider: Any provider. Additional Postpartum F/U:Postpartum Depression checkup  High risk pregnancy complicated by:  hx PEC Delivery mode:  Vaginal, Spontaneous  Anticipated Birth Control:   Patch   06/07/2022 Julianne Handler, CNM

## 2022-06-06 NOTE — H&P (Signed)
OBSTETRIC ADMISSION HISTORY AND PHYSICAL  Kristin Bruce is a 26 y.o. female [phone] with IUP at [redacted]w[redacted]d by LMP presenting for eIOL . She reports +FMs, No LOF, no VB, no blurry vision, headaches or peripheral edema, and RUQ pain.  She plans on formula feeding. She request patch  for birth control. She received her prenatal care at  [facility]    Dating: By LMP --->  Estimated Date of Delivery: 06/13/22  Sono:    @[redacted]w[redacted]d , CWD, normal anatomy, Posterior placenta  presentation,  231g, 21% EFW   Prenatal History/Complications: None  Past Medical History: Past Medical History:  Diagnosis Date   Adjustment disorder with depressed mood 11/22/2015   Anemia    Anxiety    Depression    GBS (group B streptococcus) infection    History of preterm delivery, currently pregnant 11/18/2021   HSV (herpes simplex virus) anogenital infection    Suicidal thoughts    during 1st pregnancy   Wears glasses     Past Surgical History: Past Surgical History:  Procedure Laterality Date   INCISION AND DRAINAGE ABSCESS N/A 05/13/2018   Procedure: INCISION AND DRAINAGE, PILONIDAL  ABSCESS;  Surgeon: 05/15/2018, MD;  Location: [facility] OR;  Service: General;  Laterality: N/A;   INCISION AND DRAINAGE PERIRECTAL ABSCESS N/A 04/01/2018   Procedure: IRRIGATION AND DEBRIDEMENT PERIRECTAL ABSCESS;  Surgeon: 04/03/2018, MD;  Location: [facility] OR;  Service: General;  Laterality: N/A;   PILONIDAL CYST EXCISION N/A 07/20/2018   Procedure: EXCISION OF PILONIDAL DISEASE;  Surgeon: 09/18/2018, MD;  Location: [facility];  Service: General;  Laterality: N/A;    Obstetrical History: OB History     Gravida  5   Para  3   Term  1   Preterm  2   AB  1   Living  3      SAB  1   IAB  0   Ectopic  0   Multiple  0   Live Births  3           Social History Social History   Socioeconomic History   Marital status: Single    Spouse name: Not on file   Number of children: 2   Years of  education: Not on file   Highest education level: Not on file  Occupational History   Not on file  Tobacco Use   Smoking status: Never   Smokeless tobacco: Never  Vaping Use   Vaping Use: Never used  Substance and Sexual Activity   Alcohol use: Not Currently    Comment: not since pregnancy   Drug use: Not Currently    Types: Marijuana    Comment: no using for 2 months   Sexual activity: Yes    Birth control/protection: None  Other Topics Concern   Not on file  Social History Narrative   Not on file   Social Determinants of Health   Financial Resource Strain: Not on file  Food Insecurity: No Food Insecurity (06/06/2022)   Hunger Vital Sign    Worried About Running Out of Food in the Last Year: Never true    Ran Out of Food in the Last Year: Never true  Transportation Needs: No Transportation Needs (06/06/2022)   PRAPARE - 06/08/2022 (Medical): No    Lack of Transportation (Non-Medical): No  Physical Activity: Not on file  Stress: Not on file  Social Connections: Not on file    Family  History: Family History  Problem Relation Age of Onset   Diabetes Mother    Hypertension Mother    Heart failure Mother    Diabetes Maternal Grandfather     Allergies: No Known Allergies  Medications Prior to Admission  Medication Sig Dispense Refill Last Dose   aspirin 81 MG chewable tablet Chew 1 tablet (81 mg total) by mouth daily. 90 tablet 3 Unknown   Prenatal 27-1 MG TABS Take 1 tablet by mouth daily. 30 tablet 10 Unknown     Review of Systems   All systems reviewed and negative except as stated in HPI  Blood pressure 128/84, pulse 81, temperature 98.2 F (36.8 C), temperature source Oral, height 5' (1.524 m), weight 61.9 kg, last menstrual period 09/07/2021, unknown if currently breastfeeding. General appearance: alert, cooperative, and appears stated age Lungs: normal work of breathing  Heart: regular rate Abdomen: soft, non-tender;  bowel sounds normal Pelvic: No lesions appreciated  Extremities: Homans sign is negative, no sign of DVT Dilation: 3 Effacement (%): 70 Station: -2 Exam by:: Scientist, product/process development   Prenatal labs: ABO, Rh: --/--/O POS (11/19 0400) Antibody: NEG (11/19 0400) Rubella: 2.96 (07/03 1651) RPR: Non Reactive (09/13 0824)  HBsAg: Negative (07/03 1651)  HIV: Non Reactive (09/13 0824)  GBS: Negative/-- (11/02 1117)  1 hr Glucola Normal  Genetic screening  Normal Anatomy US Normal   Prenatal Transfer Tool  Maternal Diabetes: No Genetic Screening: Normal Maternal Ultrasounds/Referrals: Normal Fetal Ultrasounds or other Referrals:  None Maternal Substance Abuse:  No Significant Maternal Medications:  None Significant Maternal Lab Results:  Group B Strep negative Number of Prenatal Visits:greater than 3 verified prenatal visits Other Comments:  None  Results for orders placed or performed during the hospital encounter of 06/06/22 (from the past 24 hour(s))  CBC   Collection Time: 06/06/22  4:00 AM  Result Value Ref Range   WBC 5.4 4.0 - 10.5 K/uL   RBC 3.75 (L) 3.87 - 5.11 MIL/uL   Hemoglobin 10.1 (L) 12.0 - 15.0 g/dL   HCT 01.7 (L) 51.0 - 25.8 %   MCV 85.9 80.0 - 100.0 fL   MCH 26.9 26.0 - 34.0 pg   MCHC 31.4 30.0 - 36.0 g/dL   RDW 52.7 78.2 - 42.3 %   Platelets 276 150 - 400 K/uL   nRBC 0.0 0.0 - 0.2 %  Type and screen   Collection Time: 06/06/22  4:00 AM  Result Value Ref Range   ABO/RH(D) O POS    Antibody Screen NEG    Sample Expiration      06/09/2022,2359 Performed at [facility], [address] [place], [place] [address]     Patient Active Problem List   Diagnosis Date Noted   Encounter for elective induction of labor 06/06/2022   Anemia affecting pregnancy in third trimester 04/14/2022   Hx of preterm delivery, currently pregnant, third trimester 01/11/2022   History of pilonidal abscess 01/11/2022   Supervision of high risk pregnancy, antepartum 11/18/2021    Suicidal thoughts (2019)    Adjustment disorder with mixed anxiety and depressed mood 11/02/2021   Anxiety state 11/02/2021   Alpha thalassemia silent carrier 12/09/2020   Migraine 10/30/2020   History of pre-eclampsia 09/30/2020   Herpes simplex type 1 infection 06/09/2017    Assessment/Plan:  Kristin Bruce is a 26 y.o. [id number] at [redacted]w[redacted]d here for eIOL   #Labor:Starting pitocin. Consider AROM at next check  #Pain: Per patient  #FWB: Cat 1 #ID:  GBS neg #MOF: formula  #MOC:Patch  #Circ:  yes  Celedonio Savage, MD  06/06/2022, 5:40 AM

## 2022-06-06 NOTE — Anesthesia Procedure Notes (Signed)
Epidural Patient location during procedure: OB Start time: 06/06/2022 8:14 AM End time: 06/06/2022 8:25 AM  Staffing Anesthesiologist: Lowella Curb, MD Performed: other anesthesia staff   Preanesthetic Checklist Completed: patient identified, IV checked, site marked, risks and benefits discussed, surgical consent, monitors and equipment checked, pre-op evaluation and timeout performed  Epidural Patient position: sitting Prep: ChloraPrep Patient monitoring: heart rate, cardiac monitor, continuous pulse ox and blood pressure Approach: midline Injection technique: LOR air  Needle:  Needle type: Tuohy  Needle gauge: 17 G Needle length: 9 cm Needle insertion depth: 4 cm Catheter type: closed end flexible Catheter size: 20 Guage Catheter at skin depth: 8 cm Test dose: negative  Assessment Events: blood not aspirated, injection not painful, no injection resistance, no paresthesia and negative IV test  Additional Notes Epidural placed by SRNA under direct supervisionReason for block:procedure for pain

## 2022-06-06 NOTE — Anesthesia Postprocedure Evaluation (Signed)
Anesthesia Post Note  Patient: Kristin Bruce  Procedure(s) Performed: AN AD HOC LABOR EPIDURAL     Patient location during evaluation: Mother Baby Anesthesia Type: Epidural Level of consciousness: awake Pain management: satisfactory to patient Vital Signs Assessment: post-procedure vital signs reviewed and stable Respiratory status: spontaneous breathing Cardiovascular status: stable Anesthetic complications: no   No notable events documented.  Last Vitals:  Vitals:   06/06/22 1123 06/06/22 1234  BP: 111/77 114/74  Pulse: 80 80  Resp: 18 16  Temp: 37.3 C 37.2 C  SpO2: 100% 100%    Last Pain:  Vitals:   06/06/22 1234  TempSrc: Oral  PainSc:    Pain Goal:                   [employer]

## 2022-06-06 NOTE — Anesthesia Preprocedure Evaluation (Signed)
Anesthesia Evaluation  Patient identified by MRN, date of birth, ID band Patient awake    Reviewed: Allergy & Precautions, NPO status , Patient's Chart, lab work & pertinent test results  Airway Mallampati: II  TM Distance: >3 FB Neck ROM: Full    Dental no notable dental hx.    Pulmonary neg pulmonary ROS   Pulmonary exam normal breath sounds clear to auscultation       Cardiovascular negative cardio ROS Normal cardiovascular exam Rhythm:Regular Rate:Normal     Neuro/Psych  Headaches PSYCHIATRIC DISORDERS  Depression       GI/Hepatic negative GI ROS, Neg liver ROS,,,  Endo/Other  negative endocrine ROS    Renal/GU negative Renal ROS  negative genitourinary   Musculoskeletal negative musculoskeletal ROS (+)    Abdominal   Peds  Hematology  (+) Blood dyscrasia, anemia Lab Results      Component                Value               Date                      WBC                      5.3                 04/29/2021                HGB                      8.4 (L)             04/29/2021                HCT                      27.3 (L)            04/29/2021                MCV                      85.0                04/29/2021                PLT                      203                 04/29/2021              Anesthesia Other Findings Presents with PPROM  Reproductive/Obstetrics (+) Pregnancy                              Anesthesia Physical Anesthesia Plan  ASA: 3  Anesthesia Plan: Epidural   Post-op Pain Management:    Induction:   PONV Risk Score and Plan: Treatment may vary due to age or medical condition  Airway Management Planned: Natural Airway  Additional Equipment:   Intra-op Plan:   Post-operative Plan:   Informed Consent: I have reviewed the patients History and Physical, chart, labs and discussed the procedure including the risks, benefits and alternatives for the  proposed anesthesia with the patient or authorized representative who has indicated  his/her understanding and acceptance.       Plan Discussed with: Anesthesiologist  Anesthesia Plan Comments: (Patient identified. Risks, benefits, options discussed with patient including but not limited to bleeding, infection, nerve damage, paralysis, failed block, incomplete pain control, headache, blood pressure changes, nausea, vomiting, reactions to medication, itching, and post partum back pain. Confirmed with bedside nurse the patient's most recent platelet count. Confirmed with the patient that they are not taking any anticoagulation, have any bleeding history or any family history of bleeding disorders. Patient expressed understanding and wishes to proceed. All questions were answered. )         Anesthesia Quick Evaluation

## 2022-06-07 MED ORDER — IBUPROFEN 600 MG PO TABS: 600.0000 mg | ORAL_TABLET | Freq: Four times a day (QID) | ORAL | 0 refills | Status: DC

## 2022-06-07 MED ORDER — OXYCODONE HCL 5 MG PO TABS: 5.0000 mg | ORAL_TABLET | ORAL | 0 refills | Status: DC | PRN

## 2022-06-07 NOTE — Clinical Social Work Maternal (Signed)
CLINICAL SOCIAL WORK MATERNAL/CHILD NOTE  Patient Details  Name: Kristin Bruce MRN: [id number] Date of Birth: [birth year]  Date:  06/07/2022  Clinical Social Worker Initiating Note:  Letta Kocher, LCSWA Date/Time: Initiated:  06/07/22/1236     Child's Name:  Clydene Fake.   Biological Parents:  Mother, Father Alyssamarie Mounsey Oct 18[birth year] 1997Orlie Pollen Siler 04/19/1996)   Need for Interpreter:  None   Reason for Referral:  Current Substance Use/Substance Use During Pregnancy     Address:  [place] [address]    Phone number:  [phone] (home)     Additional phone number:   Household Members/Support Persons (HM/SP):   Household Member/Support Person 1, Household Member/Support Person 2, Household Member/Support Person 3, Household Member/Support Person 4   HM/SP Name Relationship DOB or Age  HM/SP -[place] significant other 04/19/1996  HM/SP -2 Agusta Hackenberg daughter 03/17/2016  HM/SP -3 Jonathon Jordan Sobczyk-Siler daughter 12/29/2017  HM/SP -4 Kior Siler daughter 04/29/2021  HM/SP -5        HM/SP -6        HM/SP -7        HM/SP -8          Natural Supports (not living in the home):      Professional Supports:     Employment: Animator   Type of Work: Paediatric nurse   Education:  [place] arranged:    Museum/gallery curator Resources:  [employer]   Other Resources:  Physicist, medical  , [place] Considerations Which May Impact Care:    Strengths:  Ability to meet basic needs  , Compliance with medical plan  , Pediatrician chosen   Psychotropic Medications:         Pediatrician:    [place] area  Pediatrician List:   [facility] ([address])  [place]      Pediatrician Fax Number:    Risk Factors/Current Problems:  Substance Use     Cognitive State:  Able to Concentrate  , Alert     Mood/Affect:  Calm  ,  Comfortable     CSW Assessment: CSW received consult for Hx of anxiety, depression and THC use. CSW met with MOB to complete assessment and offer support. CSW entered the room, observed MOB resting in bed, FOB standing bedside and the infant lying comfortably in the bassinet. CSW introduced self, CSW role and reason for visit. MOB was agreeable to visit. MOB granted CSW permission to complete assessment while FOB was present. CSW inquired about how MOB was feeling, MOB reported good just tired. MOB reported there were some stressful times thought her labor and delivery process but overall it was good.   CSW inquired about MOB MH hx, MOB reported she was diagnosed with Anxiety and Depression earlier this year. CSW inquired about treatment, MOB reported none at this time. MOB reported she was on medication prior to pregnancy but was unable to continue taking the medication while pregnant. MOB reports a stable mood without the medication and does not plan on restarting at this time. MOB stated she feels comfortable talking with her OB if the need for medication my arise. CSW assessed for safety, MOB denied any current SI or HI. MOB reported she and her family now have stable housing which has drastically improved her mood. MOB explored the stressors of  living in a hotel prior to finding stable housing. MOB identified FOB as her support. CSW provided education regarding the baby blues period vs. perinatal mood disorders, discussed treatment and gave resources for mental health follow up if concerns arise.  CSW recommends self-evaluation during the postpartum time period using the New Mom Checklist from Postpartum Progress and encouraged MOB to contact a medical professional if symptoms are noted at any time.   CSW inquired about noted THC use during pregnancy. MOB denied any THC use during pregnancy, CSW notified MOB that the infants UDS was negative and the CDS was pending.   CSW provided review of Sudden Infant  Death Syndrome (SIDS) precautions.  MOB reported they have all necessary items for the infant including a bassinet for him to sleep. CSW identifies no further need for intervention and no barriers to discharge at this time.  CSW Plan/Description:  Hospital Drug Screen Policy Information, Sudden Infant Death Syndrome (SIDS) Education, Perinatal Mood and Anxiety Disorder (PMADs) Education, No Further Intervention Required/No Barriers to Discharge, CSW Will Continue to Monitor Umbilical Cord Tissue Drug Screen Results and Make Report if Renata Caprice, LCSW 06/07/2022, 12:51 PM

## 2022-06-08 ENCOUNTER — Encounter (HOSPITAL_COMMUNITY): Payer: Self-pay | Admitting: Family Medicine

## 2022-06-08 NOTE — BH Specialist Note (Signed)
Pt did not arrive to video visit and did not answer the phone; Unable to leave message as voicemail is full; left MyChart message for patient.   

## 2022-06-10 ENCOUNTER — Other Ambulatory Visit (INDEPENDENT_AMBULATORY_CARE_PROVIDER_SITE_OTHER): Payer: Self-pay | Admitting: Certified Nurse Midwife

## 2022-06-11 ENCOUNTER — Other Ambulatory Visit: Payer: Self-pay | Admitting: Certified Nurse Midwife

## 2022-06-15 ENCOUNTER — Telehealth (HOSPITAL_COMMUNITY): Payer: Self-pay | Admitting: *Deleted

## 2022-06-15 NOTE — Telephone Encounter (Signed)
Voice mailbox is full. Unable to leave message.  Duffy Rhody, RN 06-15-2022 at 1:26pm

## 2022-06-21 ENCOUNTER — Ambulatory Visit: Payer: Medicaid Other | Admitting: Clinical

## 2022-06-21 DIAGNOSIS — Z91199 Patient's noncompliance with other medical treatment and regimen due to unspecified reason: Secondary | ICD-10-CM

## 2022-06-25 ENCOUNTER — Encounter: Payer: Self-pay | Admitting: *Deleted

## 2022-06-25 ENCOUNTER — Telehealth: Payer: Self-pay | Admitting: *Deleted

## 2022-06-25 NOTE — Telephone Encounter (Signed)
I called Kristin Bruce to discuss her Mychart messages re: returning to work.  I reached her voicemail and heard message voicemail is full. I will reply to her MyChart message. Job Holtsclaw,RN

## 2022-06-28 ENCOUNTER — Ambulatory Visit: Payer: Medicaid Other

## 2022-06-29 ENCOUNTER — Telehealth: Payer: Self-pay

## 2022-06-29 NOTE — Telephone Encounter (Signed)
Called pt to follow up on missed visit yesterday. Unable to leave VM.

## 2022-07-16 ENCOUNTER — Ambulatory Visit: Payer: Medicaid Other | Admitting: Obstetrics and Gynecology

## 2022-07-19 NOTE — L&D Delivery Note (Signed)
OB/GYN Faculty Practice Delivery Note  Kristin Bruce is a 27 y.o. [id number] s/p NSVD at [redacted]w[redacted]d. She was admitted for SOL.   ROM: 1h 63m with clear fluid GBS Status: Negative/-- (10/15 0857) Maximum Maternal Temperature: Temp (24hrs), Avg:98 F (36.7 C), Min:97.5 F (36.4 C), Max:98.7 F (37.1 C)   Labor Progress: Initial SVE: 4.5/70/-2. AROM performed. She then progressed to complete.   Delivery Date/Time: 05/21/23 0912 Delivery: Called to room and patient was complete. Pushed through 2 contractions. Head delivered OA. No nuchal cord present. Shoulders delivered transverse with left hand across chest. Delivered left arm, then rest of babe delivered without difficulty. Infant with spontaneous cry, placed on mother's abdomen, dried and stimulated. Cord clamped x 2 after 1-minute delay, and cut by FOB. Cord blood drawn. Placenta delivered spontaneously with gentle cord traction. Fundus firm with massage and Pitocin. Labia, perineum, and vagina inspected with no lacerations.  Baby Weight: pending  Placenta: 3 vessel, intact. Sent to L&D Complications: None Lacerations: None EBL: 212 mL Anesthesia: epidural  Infant:  APGAR (1 MIN): 9  APGAR (5 MINS): 9  APGAR (10 MINS):    Joanne Gavel, MD [facility] Family Medicine Fellow, [facility] for [facility], [facility] 05/21/2023, 9:31 AM

## 2022-10-19 ENCOUNTER — Inpatient Hospital Stay (HOSPITAL_COMMUNITY)
Admission: AD | Admit: 2022-10-19 | Discharge: 2022-10-19 | Disposition: A | Discharge status: Home / Self Care | Payer: Medicaid Other | Attending: Obstetrics and Gynecology | Admitting: Obstetrics and Gynecology

## 2022-10-19 ENCOUNTER — Encounter (HOSPITAL_COMMUNITY): Payer: Self-pay

## 2022-10-19 DIAGNOSIS — R350 Frequency of micturition: Secondary | ICD-10-CM | POA: Diagnosis not present

## 2022-10-19 DIAGNOSIS — R519 Headache, unspecified: Secondary | ICD-10-CM | POA: Insufficient documentation

## 2022-10-19 DIAGNOSIS — R110 Nausea: Secondary | ICD-10-CM | POA: Diagnosis not present

## 2022-10-19 DIAGNOSIS — R102 Pelvic and perineal pain: Secondary | ICD-10-CM | POA: Insufficient documentation

## 2022-10-19 DIAGNOSIS — R300 Dysuria: Secondary | ICD-10-CM | POA: Diagnosis not present

## 2022-10-19 DIAGNOSIS — O26891 Other specified pregnancy related conditions, first trimester: Secondary | ICD-10-CM | POA: Insufficient documentation

## 2022-10-19 DIAGNOSIS — Z3A09 9 weeks gestation of pregnancy: Secondary | ICD-10-CM | POA: Insufficient documentation

## 2022-10-19 DIAGNOSIS — R42 Dizziness and giddiness: Secondary | ICD-10-CM | POA: Diagnosis not present

## 2022-10-19 LAB — URINALYSIS, ROUTINE W REFLEX MICROSCOPIC
Bilirubin Urine: NEGATIVE
Glucose, UA: NEGATIVE mg/dL
Ketones, ur: NEGATIVE mg/dL
Nitrite: NEGATIVE
Protein, ur: 100 mg/dL — AB
Specific Gravity, Urine: 1.027 (ref 1.005–1.030)
WBC, UA: >50 WBC/hpf (ref 0–5)
pH: 6 (ref 5.0–8.0)

## 2022-10-19 LAB — BASIC METABOLIC PANEL
Anion gap: 9 (ref 5–15)
BUN: 12 mg/dL (ref 6–20)
CO2: 23 mmol/L (ref 22–32)
Calcium: 9.8 mg/dL (ref 8.9–10.3)
Chloride: 102 mmol/L (ref 98–111)
Creatinine, Ser: 0.52 mg/dL (ref 0.44–1.00)
GFR, Estimated: >60 mL/min (ref >60)
Glucose, Bld: 115 mg/dL — ABNORMAL HIGH (ref 70–99)
Potassium: 3.7 mmol/L (ref 3.5–5.1)
Sodium: 134 mmol/L — ABNORMAL LOW (ref 135–145)

## 2022-10-19 LAB — CBC
HCT: 33.9 % — ABNORMAL LOW (ref 36.0–46.0)
Hemoglobin: 10.9 g/dL — ABNORMAL LOW (ref 12.0–15.0)
MCH: 27.4 pg (ref 26.0–34.0)
MCHC: 32.2 g/dL (ref 30.0–36.0)
MCV: 85.2 fL (ref 80.0–100.0)
Platelets: 354 10*3/uL (ref 150–400)
RBC: 3.98 MIL/uL (ref 3.87–5.11)
RDW: 14.5 % (ref 11.5–15.5)
WBC: 7.4 10*3/uL (ref 4.0–10.5)
nRBC: 0 % (ref 0.0–0.2)

## 2022-10-19 LAB — POCT PREGNANCY, URINE: Preg Test, Ur: POSITIVE — AB

## 2022-10-19 MED ORDER — HYDROXYZINE PAMOATE 25 MG PO CAPS: 25.0000 mg | ORAL_CAPSULE | Freq: Three times a day (TID) | ORAL | 0 refills | Status: AC | PRN

## 2022-10-19 MED ORDER — POLYSACCHARIDE IRON COMPLEX 150 MG PO CAPS: 150.0000 mg | ORAL_CAPSULE | ORAL | 0 refills | Status: DC

## 2022-10-19 MED ORDER — ACETAMINOPHEN-CAFFEINE 500-65 MG PO TABS
2.0000 | ORAL_TABLET | Freq: Once | ORAL | Status: AC
  Administered 2022-10-19: 2 via ORAL
  Filled 2022-10-19: qty 2

## 2022-10-19 MED ORDER — CEFADROXIL 500 MG PO CAPS: 500.0000 mg | ORAL_CAPSULE | Freq: Two times a day (BID) | ORAL | 0 refills | Status: DC

## 2022-10-19 NOTE — MAU Note (Signed)
...  ROBBYE SPANGLER is a 27 y.o. at [redacted]w[redacted]d here in MAU reporting: Intermittent dizziness and HA's since the beginning of March. She also reports she is concerned for a UTI. She reports she does not feel as if she is emptying her bladder when she uses the restroom. She endorses a constant urge to urinate and reports her urine smells like "ammonia" at times. Denies VB or concerning vaginal discharge.  She reports her mom had a stroke two weeks ago and she has been stressed due to this and putting off coming to be evaluated for herself. She reports sleep is the only thing that helps her HA's as well as eating. Has only eaten mashed potatoes today.  LMP: 08/20/2022 - regular periods Onset of complaint:  Pain score: 5/10 HA  Lab orders placed from triage:  POCT Preg, UA

## 2022-10-19 NOTE — MAU Provider Note (Addendum)
History    Chief Complaint  Patient presents with   Headache   27 yo female G6P4 who presents with headache and UTI symptoms. Her headache started about four weeks ago (beginning of March 2024). This was around the time that her mother had a stroke and had to go to the ICU. She thought that it was likely stress that was causing her headache but was worried when the headaches didn't go away. She describes the HA as constant pressure in her temples on both sides of her head. The pressure in her head is constant but pain with the HA is intermittent. She has tried to eat and drink to help with the headache but is limited in how much she can eat by nausea. She has also been feeling really lightheaded, which happened in early pregnancy with her last child but she blamed it on environmental factors at the time. She has a lot of other social stressors she thinks may be contributing to her headaches including 4 kids, youngest is 11mo, bills, care giving for her mother and relationship stress. No vertigo, no unsteadiness on her feet, no recent vomiting, no weakness, no numbness or tingling, no fever, denies history of migraines. She also feels that she has a UTI because she feels she always has to pee, pee smells like ammonia and she feels like she can't fully empty her bladder. She also has lower pelvic pain with urination. No burning or itching with urination, no back pain.   Headache  Associated symptoms include nausea. Pertinent negatives include no abdominal pain, fever, numbness, rhinorrhea, sinus pressure, vomiting or weakness.    OB History     Gravida  6   Para  4   Term  2   Preterm  2   AB  1   Living  4      SAB  1   IAB  0   Ectopic  0   Multiple  0   Live Births  4           Past Medical History:  Diagnosis Date   Adjustment disorder with depressed mood 11/22/2015   Anemia    Anxiety    Depression    GBS (group B streptococcus) infection    History of preterm  delivery, currently pregnant 11/18/2021   HSV (herpes simplex virus) anogenital infection    Suicidal thoughts    during 1st pregnancy   Wears glasses     Past Surgical History:  Procedure Laterality Date   INCISION AND DRAINAGE ABSCESS N/A 05/13/2018   Procedure: INCISION AND DRAINAGE, PILONIDAL  ABSCESS;  Surgeon: Judeth Horn, MD;  Location: [place];  Service: General;  Laterality: N/A;   INCISION AND DRAINAGE PERIRECTAL ABSCESS N/A 04/01/2018   Procedure: IRRIGATION AND DEBRIDEMENT PERIRECTAL ABSCESS;  Surgeon: Georganna Skeans, MD;  Location: [place];  Service: General;  Laterality: N/A;   PILONIDAL CYST EXCISION N/A 07/20/2018   Procedure: EXCISION OF PILONIDAL DISEASE;  Surgeon: Ileana Roup, MD;  Location: [place];  Service: General;  Laterality: N/A;    Family History  Problem Relation Age of Onset   Diabetes Mother    Hypertension Mother    Heart failure Mother    Diabetes Maternal Grandfather     Social History   Tobacco Use   Smoking status: Never   Smokeless tobacco: Never  Vaping Use   Vaping Use: Never used  Substance Use Topics   Alcohol use: Not Currently  Comment: not since pregnancy   Drug use: Not Currently    Types: Marijuana    Comment: no using for 2 months    Allergies: No Known Allergies  Medications Prior to Admission  Medication Sig Dispense Refill Last Dose   acetaminophen (TYLENOL) 500 MG tablet Take 500-1,000 mg by mouth every 6 (six) hours as needed for mild pain, headache or fever.      ibuprofen (ADVIL) 600 MG tablet Take 1 tablet (600 mg total) by mouth every 6 (six) hours. 30 tablet 0    oxyCODONE (OXY IR/ROXICODONE) 5 MG immediate release tablet Take 1 tablet (5 mg total) by mouth every 4 (four) hours as needed for moderate pain. 6 tablet 0    Prenatal 27-1 MG TABS Take 1 tablet by mouth daily. (Patient not taking: Reported on 06/06/2022) 30 tablet 10     Review of Systems  Constitutional:  Positive for  appetite change and fatigue. Negative for fever.  HENT:  Negative for congestion, rhinorrhea, sinus pressure, sinus pain and sneezing.   Eyes:  Negative for visual disturbance.  Respiratory:  Negative for shortness of breath.   Cardiovascular:  Negative for leg swelling.  Gastrointestinal:  Positive for nausea. Negative for abdominal pain and vomiting.  Genitourinary:  Positive for dysuria, frequency, pelvic pain and urgency. Negative for flank pain.  Musculoskeletal:  Negative for myalgias and neck stiffness.  Neurological:  Positive for light-headedness and headaches. Negative for weakness and numbness.  Psychiatric/Behavioral:  The patient is nervous/anxious.    Physical Exam Blood pressure 113/75, pulse 73, temperature 98.7 F (37.1 C), temperature source Oral, resp. rate 14, height 5' (1.524 m), weight 53.8 kg, last menstrual period 08/20/2022, SpO2 100 %, unknown if currently breastfeeding. Physical Exam Constitutional:      General: She is not in acute distress.    Appearance: She is not ill-appearing or toxic-appearing.  HENT:     Head: Normocephalic and atraumatic.  Eyes:     Extraocular Movements: Extraocular movements intact.  Cardiovascular:     Rate and Rhythm: Normal rate and regular rhythm.     Heart sounds: Normal heart sounds.  Pulmonary:     Effort: Pulmonary effort is normal.     Breath sounds: Normal breath sounds.  Abdominal:     General: Bowel sounds are normal. There is no distension.     Palpations: Abdomen is soft.     Tenderness: There is no abdominal tenderness.  Musculoskeletal:        General: No swelling. Normal range of motion.     Cervical back: Normal range of motion and neck supple.  Skin:    General: Skin is warm and dry.  Neurological:     Mental Status: She is alert.     Cranial Nerves: No facial asymmetry.     Motor: No weakness.    Results for orders placed or performed during the hospital encounter of 10/19/22 (from the past 24  hour(s))  Pregnancy, urine POC     Status: Abnormal   Collection Time: 10/19/22  1:13 PM  Result Value Ref Range   Preg Test, Ur POSITIVE (A) NEGATIVE  Urinalysis, Routine w reflex microscopic -Urine, Clean Catch     Status: Abnormal   Collection Time: 10/19/22  1:19 PM  Result Value Ref Range   Color, Urine YELLOW YELLOW   APPearance CLOUDY (A) CLEAR   Specific Gravity, Urine 1.027 1.005 - 1.030   pH 6.0 5.0 - 8.0   Glucose, UA NEGATIVE NEGATIVE  mg/dL   Hgb urine dipstick SMALL (A) NEGATIVE   Bilirubin Urine NEGATIVE NEGATIVE   Ketones, ur NEGATIVE NEGATIVE mg/dL   Protein, ur 100 (A) NEGATIVE mg/dL   Nitrite NEGATIVE NEGATIVE   Leukocytes,Ua MODERATE (A) NEGATIVE   RBC / HPF 11-20 0 - 5 RBC/hpf   WBC, UA >50 0 - 5 WBC/hpf   Bacteria, UA FEW (A) NONE SEEN   Squamous Epithelial / HPF 11-20 0 - 5 /HPF   Mucus PRESENT   CBC     Status: Abnormal   Collection Time: 10/19/22  3:27 PM  Result Value Ref Range   WBC 7.4 4.0 - 10.5 K/uL   RBC 3.98 3.87 - 5.11 MIL/uL   Hemoglobin 10.9 (L) 12.0 - 15.0 g/dL   HCT 33.9 (L) 36.0 - 46.0 %   MCV 85.2 80.0 - 100.0 fL   MCH 27.4 26.0 - 34.0 pg   MCHC 32.2 30.0 - 36.0 g/dL   RDW 14.5 11.5 - 15.5 %   Platelets 354 150 - 400 K/uL   nRBC 0.0 0.0 - 0.2 %  Basic metabolic panel     Status: Abnormal   Collection Time: 10/19/22  3:27 PM  Result Value Ref Range   Sodium 134 (L) 135 - 145 mmol/L   Potassium 3.7 3.5 - 5.1 mmol/L   Chloride 102 98 - 111 mmol/L   CO2 23 22 - 32 mmol/L   Glucose, Bld 115 (H) 70 - 99 mg/dL   BUN 12 6 - 20 mg/dL   Creatinine, Ser 0.52 0.44 - 1.00 mg/dL   Calcium 9.8 8.9 - 10.3 mg/dL   GFR, Estimated >60 >60 mL/min   Anion gap 9 5 - 15     MAU Course Procedures  MDM Headache is most likely tension type headache from life and social stressors she is currently experiencing. Nausea and inability to eat or drink consistently is likely also a large factor in her headache and lightheadedness. Headache improved with  Excedrin. CBC and BMP were signficant for hyponatremia (134), and anemia (Hgb 10.9). Given that she has a history of anemia requiring Venofer, and has been feeling lightheaded we will treat as symptomatic anemia and start oral iron. UA showed elevated WBCs, RBCs and bacteria which is consistent with UTI. UA also showed squamous epithelia and was likely not a completely clean catch. However, given that she is symptomatic we will treat with cefadroxil while waiting for culture results.   Plan - acetaminophen or Excedrin PRN - cefadroxil - hydroxyzine  - iron 325 mg - Follow up with primary OB  CNM attestation:  I have seen and examined this patient; I agree with above documentation in the resident's note.   Kristin Bruce is a 27 y.o. [phone] reporting recurrent headache and dizziness Denies VB, cramping, urinary symptoms, vaginal itching/burning.  PE: BP 104/71   Pulse 83   Temp 98.7 F (37.1 C) (Oral)   Resp 14   Ht 5' (1.524 m)   Wt 53.8 kg   LMP 08/20/2022   SpO2 100%   BMI 23.16 kg/m  Gen: calm comfortable, NAD Resp: normal effort, no distress Abd: soft, nontender  ROS, labs, PMH reviewed  Meds ordered this encounter  Medications   acetaminophen-caffeine (EXCEDRIN TENSION HEADACHE) 500-65 MG per tablet 2 tablet   iron polysaccharides (NIFEREX) 150 MG capsule    Sig: Take 1 capsule (150 mg total) by mouth every other day.    Dispense:  15 capsule    Refill:  0    Order Specific Question:   Supervising Provider    Answer:   CONSTANT, PEGGY [[id number]]   cefadroxil (DURICEF) 500 MG capsule    Sig: Take 1 capsule (500 mg total) by mouth 2 (two) times daily.    Dispense:  14 capsule    Refill:  0    Order Specific Question:   Supervising Provider    Answer:   CONSTANT, PEGGY [[id number]]   hydrOXYzine (VISTARIL) 25 MG capsule    Sig: Take 1 capsule (25 mg total) by mouth 3 (three) times daily as needed.    Dispense:  90 capsule    Refill:  0    Order Specific Question:    Supervising Provider    Answer:   CONSTANT, [place] [[id number]]    Plan: D/C home Teaching done about anemia, nutrition guidelines in early pregnancy Urine culture in work, initiate treatment based on dysuria, abnormal UA and patient stated preference Pain score 0/10 at time of discharge Return to MAU as needed for emergencies  Mallie Snooks, MSA, MSN, CNM Certified Nurse Midwife, Faculty Practice

## 2022-10-19 NOTE — MAU Note (Signed)
Positive UPT per NT.

## 2022-10-19 NOTE — Discharge Instructions (Signed)

## 2022-10-21 LAB — CULTURE, OB URINE: Culture: >=100000 — AB

## 2022-12-15 ENCOUNTER — Encounter: Payer: Self-pay | Admitting: Student

## 2022-12-15 ENCOUNTER — Other Ambulatory Visit (HOSPITAL_COMMUNITY)
Admission: RE | Admit: 2022-12-15 | Discharge: 2022-12-15 | Disposition: A | Discharge status: Home / Self Care | Payer: Medicaid Other | Source: Ambulatory Visit | Attending: Student | Admitting: Student

## 2022-12-15 ENCOUNTER — Ambulatory Visit (INDEPENDENT_AMBULATORY_CARE_PROVIDER_SITE_OTHER): Payer: Medicaid Other | Admitting: Student

## 2022-12-15 VITALS — BP 108/70 | HR 84 | Wt 120.2 lb

## 2022-12-15 DIAGNOSIS — Z3A16 16 weeks gestation of pregnancy: Secondary | ICD-10-CM | POA: Diagnosis not present

## 2022-12-15 DIAGNOSIS — Z8759 Personal history of other complications of pregnancy, childbirth and the puerperium: Secondary | ICD-10-CM

## 2022-12-15 DIAGNOSIS — B009 Herpesviral infection, unspecified: Secondary | ICD-10-CM

## 2022-12-15 DIAGNOSIS — Z3480 Encounter for supervision of other normal pregnancy, unspecified trimester: Secondary | ICD-10-CM | POA: Diagnosis not present

## 2022-12-15 DIAGNOSIS — O0990 Supervision of high risk pregnancy, unspecified, unspecified trimester: Secondary | ICD-10-CM

## 2022-12-15 DIAGNOSIS — O09892 Supervision of other high risk pregnancies, second trimester: Secondary | ICD-10-CM

## 2022-12-15 DIAGNOSIS — O09893 Supervision of other high risk pregnancies, third trimester: Secondary | ICD-10-CM

## 2022-12-15 DIAGNOSIS — O99013 Anemia complicating pregnancy, third trimester: Secondary | ICD-10-CM | POA: Diagnosis not present

## 2022-12-15 DIAGNOSIS — D563 Thalassemia minor: Secondary | ICD-10-CM

## 2022-12-15 DIAGNOSIS — O09899 Supervision of other high risk pregnancies, unspecified trimester: Secondary | ICD-10-CM

## 2022-12-15 DIAGNOSIS — O0992 Supervision of high risk pregnancy, unspecified, second trimester: Secondary | ICD-10-CM | POA: Diagnosis not present

## 2022-12-15 MED ORDER — ASPIRIN 81 MG PO TBEC: 81.0000 mg | DELAYED_RELEASE_TABLET | Freq: Every day | ORAL | 2 refills | Status: DC

## 2022-12-16 ENCOUNTER — Encounter: Payer: Self-pay | Admitting: Student

## 2022-12-16 LAB — COMPREHENSIVE METABOLIC PANEL
ALT: 15 IU/L (ref 0–32)
AST: 14 IU/L (ref 0–40)
Albumin/Globulin Ratio: 1.3 (ref 1.2–2.2)
Albumin: 4 g/dL (ref 4.0–5.0)
Alkaline Phosphatase: 62 IU/L (ref 44–121)
BUN/Creatinine Ratio: 22 (ref 9–23)
BUN: 15 mg/dL (ref 6–20)
Bilirubin Total: 1 mg/dL (ref 0.0–1.2)
CO2: 19 mmol/L — ABNORMAL LOW (ref 20–29)
Calcium: 9 mg/dL (ref 8.7–10.2)
Chloride: 104 mmol/L (ref 96–106)
Creatinine, Ser: 0.69 mg/dL (ref 0.57–1.00)
Globulin, Total: 3 g/dL (ref 1.5–4.5)
Glucose: 74 mg/dL (ref 70–99)
Potassium: 4.1 mmol/L (ref 3.5–5.2)
Sodium: 137 mmol/L (ref 134–144)
Total Protein: 7 g/dL (ref 6.0–8.5)
eGFR: 122 mL/min/{1.73_m2} (ref >59)

## 2022-12-16 LAB — CBC/D/PLT+RPR+RH+ABO+RUBIGG...
Antibody Screen: NEGATIVE
Basophils Absolute: 0 10*3/uL (ref 0.0–0.2)
Basos: 0 %
EOS (ABSOLUTE): 0 10*3/uL (ref 0.0–0.4)
Eos: 0 %
HCV Ab: NONREACTIVE
HIV Screen 4th Generation wRfx: NONREACTIVE
Hematocrit: 34.5 % (ref 34.0–46.6)
Hemoglobin: 10.9 g/dL — ABNORMAL LOW (ref 11.1–15.9)
Hepatitis B Surface Ag: NEGATIVE
Immature Grans (Abs): 0 10*3/uL (ref 0.0–0.1)
Immature Granulocytes: 0 %
Lymphocytes Absolute: 1.3 10*3/uL (ref 0.7–3.1)
Lymphs: 17 %
MCH: 27.9 pg (ref 26.6–33.0)
MCHC: 31.6 g/dL (ref 31.5–35.7)
MCV: 88 fL (ref 79–97)
Monocytes Absolute: 0.3 10*3/uL (ref 0.1–0.9)
Monocytes: 3 %
Neutrophils Absolute: 6.4 10*3/uL (ref 1.4–7.0)
Neutrophils: 80 %
Platelets: 307 10*3/uL (ref 150–450)
RBC: 3.91 x10E6/uL (ref 3.77–5.28)
RDW: 14 % (ref 11.7–15.4)
RPR Ser Ql: NONREACTIVE
Rh Factor: POSITIVE
Rubella Antibodies, IGG: 3.1 index (ref >0.99)
WBC: 8 10*3/uL (ref 3.4–10.8)

## 2022-12-16 LAB — PROTEIN / CREATININE RATIO, URINE
Creatinine, Urine: 286.7 mg/dL
Protein, Ur: 26 mg/dL
Protein/Creat Ratio: 91 mg/g creat (ref 0–200)

## 2022-12-16 LAB — HEMOGLOBIN A1C
Est. average glucose Bld gHb Est-mCnc: 97 mg/dL
Hgb A1c MFr Bld: 5 % (ref 4.8–5.6)

## 2022-12-16 LAB — HCV INTERPRETATION

## 2022-12-16 LAB — TSH: TSH: 2.12 u[IU]/mL (ref 0.450–4.500)

## 2022-12-16 NOTE — Progress Notes (Signed)
History:   Kristin Bruce is a 27 y.o. [id number] at [redacted]w[redacted]d by LMP being seen today for her first obstetrical visit.  Her obstetrical history is significant for pre-eclampsia and multigravida, and diastasis of symphosis pubis and preterm delivery x2 ( 2019 @3 +6 for proteinuria and 2022 @36 +3 d/t PPROM), and SI during 2019 pregnancy . Patient does not intend to breast feed. Pregnancy history fully reviewed.  Patient reports no complaints.      HISTORY: OB History  Gravida Para Term Preterm AB Living  6 4 2 2 1 4   SAB IAB Ectopic Multiple Live Births  1 0 0 0 4    # Outcome Date GA Lbr Len/2nd Weight Sex Delivery Anes PTL Lv  6 Current           5 Term 06/06/22 [redacted]w[redacted]d 05:17 / 00:06 7 lb 3 oz (3.26 kg) M Vag-Spont EPI  LIV     Name: Coralie Common.     Apgar1: 8  Apgar5: 9  4 Preterm 04/29/21 [redacted]w[redacted]d 09:07 / 00:29 5 lb 14.2 oz (2.671 kg) F Vag-Spont EPI  LIV     Birth Comments: WNL     Name: EMMILINE, BLAN     Apgar1: 8  Apgar5: 9  3 SAB 08/12/20 [redacted]w[redacted]d         2 Preterm 12/29/17 [redacted]w[redacted]d  6 lb 7 oz (2.92 kg) F Vag-Spont EPI N LIV  1 Term 03/17/16 [redacted]w[redacted]d 00:28 / 03:09 7 lb 2.3 oz (3.24 kg) F Vag-Spont EPI  LIV     Name: GINETTE, WIEGMANN     Apgar1: 9  Apgar5: 9    Last pap smear was done 2022 and was normal  Past Medical History:  Diagnosis Date   Adjustment disorder with depressed mood 11/22/2015   Anemia    Anemia affecting pregnancy in third trimester 04/14/2022   Anxiety    Depression    Encounter for elective induction of labor 06/06/2022   GBS (group B streptococcus) infection    History of preterm delivery, currently pregnant 11/18/2021   HSV (herpes simplex virus) anogenital infection    Suicidal thoughts    during 1st pregnancy   Supervision of high risk pregnancy, antepartum 11/18/2021          Nursing Staff  Provider  Office Location     Dating   LMP = 10 wk [place]  [facility] Model  [x]  Traditional  [ ]  Centering  [ ]  Mom-Baby Dyad        Language   English  Anatomy  US   normal  Flu Vaccine   Declined  Genetic/Carrier Screen   NIPS:   Low risk female  AFP:   normal  [place]:  TDaP Vaccine    03/31/22  Hgb A1C or   GTT  Early 4.2  Third trimester   COVID Vaccine  none     LAB RESULTS   Rhogam    SVD (spontaneous vaginal delivery) 04/29/2021   Wears glasses    Past Surgical History:  Procedure Laterality Date   INCISION AND DRAINAGE ABSCESS N/A 05/13/2018   Procedure: INCISION AND DRAINAGE, PILONIDAL  ABSCESS;  Surgeon: Jimmye Norman, MD;  Location[facility];  Service: General;  Laterality: N/A;   INCISION AND DRAINAGE PERIRECTAL ABSCESS N/A 04/01/2018   Procedure: IRRIGATION AND DEBRIDEMENT PERIRECTAL ABSCESS;  Surgeon: Violeta Gelinas, MD;  Location: [facility] OR;  Service: General;  Laterality: N/A;   PILONIDAL CYST EXCISION N/A 07/20/2018   Procedure: EXCISION OF PILONIDAL  DISEASE;  Surgeon: Andria Meuse, MD;  Location: [facility];  Service: General;  Laterality: N/A;   Family History  Problem Relation Age of Onset   Diabetes Mother    Hypertension Mother    Heart failure Mother    Diabetes Maternal Grandfather    Social History   Tobacco Use   Smoking status: Never   Smokeless tobacco: Never  Vaping Use   Vaping Use: Never used  Substance Use Topics   Alcohol use: Not Currently    Comment: not since pregnancy   Drug use: Not Currently    Types: Marijuana    Comment: no using for 2 months   No Known Allergies Current Outpatient Medications on File Prior to Visit  Medication Sig Dispense Refill   acetaminophen (TYLENOL) 500 MG tablet Take 500-1,000 mg by mouth every 6 (six) hours as needed for mild pain, headache or fever.     hydrOXYzine (ATARAX) 25 MG tablet Take 25 mg by mouth 3 (three) times daily as needed.     Prenatal 27-1 MG TABS Take 1 tablet by mouth daily. 30 tablet 10   [DISCONTINUED] Prenatal Vit-Fe Fumarate-FA (PREPLUS) 27-1 MG TABS Take 1 tablet by mouth daily. (Patient not taking: Reported on 11/23/2021) 90 tablet  2   Current Facility-Administered Medications on File Prior to Visit  Medication Dose Route Frequency Provider Last Rate Last Admin   acetaminophen (TYLENOL) tablet 650 mg  650 mg Oral Once Warden Fillers, MD       diphenhydrAMINE (BENADRYL) capsule 25 mg  25 mg Oral Once Warden Fillers, MD       iron sucrose (VENOFER) 200 mg in sodium chloride 0.9 % 100 mL IVPB  200 mg Intravenous Once Warden Fillers, MD        Review of Systems Pertinent items noted in HPI and remainder of comprehensive ROS otherwise negative. Physical Exam:   Vitals:   12/15/22 1430  BP: 108/70  Pulse: 84  Weight: 120 lb 3.2 oz (54.5 kg)   Fetal Heart Rate (bpm): 154  System: General: well-developed, well-nourished female in no acute distress   Breasts:  normal appearance, no tenderness bilaterally   Skin: normal coloration and turgor, no rashes   Neurologic: oriented, normal, negative, normal mood   Extremities: normal strength, tone, and muscle mass, ROM of all joints is normal   HEENT PERRLA, extraocular movement intact and sclera clear, anicteric   Mouth/Teeth mucous membranes moist, pharynx normal without lesions and dental hygiene good   Neck supple and no masses   Cardiovascular: regular rate and rhythm   Respiratory:  no respiratory distress, normal breath sounds   Abdomen: soft, non-tender; bowel sounds normal; no masses,  no organomegaly   Assessment:    Pregnancy: [id number] Patient Active Problem List   Diagnosis Date Noted   Supervision of other normal pregnancy, antepartum 12/15/2022   Hx of preterm delivery, currently pregnant, third trimester 01/11/2022   History of pilonidal abscess 01/11/2022   Suicidal thoughts (2019)    Adjustment disorder with mixed anxiety and depressed mood 11/02/2021   Anxiety state 11/02/2021   Alpha thalassemia silent carrier 12/09/2020   Migraine 10/30/2020   History of pre-eclampsia 09/30/2020   Herpes simplex type 1 infection 06/09/2017     Plan:     1. Supervision of high risk pregnancy, antepartum - aspirin EC 81 MG tablet; Take 1 tablet (81 mg total) by mouth daily.  Dispense: 60 tablet; Refill: 2 - Comprehensive metabolic  panel - Culture, OB Urine - CBC/D/Plt+RPR+Rh+ABO+RubIgG... - Hemoglobin A1c - Protein / creatinine ratio, urine - TSH - GC/Chlamydia probe amp ([place])not at [facility] [place] MFM OB DETAIL +14 WK; Future - AFP, Serum, Open Spina Bifida - PANORAMA PRENATAL TEST  2. [redacted] weeks gestation of pregnancy - anticipatory guidance provided  3. Herpes simplex type 1 infection - Prophylaxis at 34-36 weeks  4. Alpha thalassemia silent carrier   5. History of preterm delivery, currently pregnant - x2 ( 2019 @3 +6 for proteinuria and 2022 @36 +3 d/t PPROM,  6. History of pre-eclampsia - in first pregnancy - Recommend baby ASA therapy   7. Short interval between pregnancies affecting pregnancy in second trimester, antepartum - Desires Patch after delivery  Initial labs drawn. Continue prenatal vitamins. Genetic Screening discussed, First trimester screen, Quad screen, and NIPS: requested. Ultrasound discussed; fetal anatomic survey: ordered. Problem list reviewed and updated. The nature of [place] [facility] Faculty Practice with multiple MDs and other Advanced Practice Providers was explained to patient; also emphasized that residents, students are part of our team. Routine obstetric precautions reviewed. Return in about 4 weeks (around 01/12/2023) for [facility], [email or web address]    Corlis Hove, NP    Faculty Practice Center for [employer], [facility]

## 2022-12-17 LAB — GC/CHLAMYDIA PROBE AMP (~~LOC~~) NOT AT ARMC
Chlamydia: NEGATIVE
Comment: NEGATIVE
Comment: NORMAL
Neisseria Gonorrhea: NEGATIVE

## 2022-12-17 LAB — CULTURE, OB URINE

## 2022-12-17 LAB — URINE CULTURE, OB REFLEX

## 2022-12-20 ENCOUNTER — Other Ambulatory Visit: Payer: Self-pay | Admitting: Student

## 2022-12-20 DIAGNOSIS — O0990 Supervision of high risk pregnancy, unspecified, unspecified trimester: Secondary | ICD-10-CM

## 2022-12-20 MED ORDER — FERROUS SULFATE 325 (65 FE) MG PO TBEC: 325.0000 mg | DELAYED_RELEASE_TABLET | Freq: Every day | ORAL | 3 refills | Status: DC

## 2022-12-23 LAB — PANORAMA PRENATAL TEST FULL PANEL:PANORAMA TEST PLUS 5 ADDITIONAL MICRODELETIONS: FETAL FRACTION: 10.8

## 2023-01-05 ENCOUNTER — Ambulatory Visit: Payer: Medicaid Other | Admitting: *Deleted

## 2023-01-05 ENCOUNTER — Ambulatory Visit: Payer: Medicaid Other | Attending: Student

## 2023-01-05 ENCOUNTER — Other Ambulatory Visit: Payer: Self-pay | Admitting: *Deleted

## 2023-01-05 VITALS — BP 102/66 | HR 79

## 2023-01-05 DIAGNOSIS — Z8759 Personal history of other complications of pregnancy, childbirth and the puerperium: Secondary | ICD-10-CM | POA: Insufficient documentation

## 2023-01-05 DIAGNOSIS — Z3480 Encounter for supervision of other normal pregnancy, unspecified trimester: Secondary | ICD-10-CM | POA: Diagnosis not present

## 2023-01-05 DIAGNOSIS — O09893 Supervision of other high risk pregnancies, third trimester: Secondary | ICD-10-CM | POA: Insufficient documentation

## 2023-01-05 DIAGNOSIS — Z363 Encounter for antenatal screening for malformations: Secondary | ICD-10-CM | POA: Insufficient documentation

## 2023-01-05 DIAGNOSIS — O09899 Supervision of other high risk pregnancies, unspecified trimester: Secondary | ICD-10-CM

## 2023-01-05 DIAGNOSIS — Z362 Encounter for other antenatal screening follow-up: Secondary | ICD-10-CM

## 2023-01-05 DIAGNOSIS — Z3A36 36 weeks gestation of pregnancy: Secondary | ICD-10-CM | POA: Diagnosis not present

## 2023-01-05 DIAGNOSIS — O09299 Supervision of pregnancy with other poor reproductive or obstetric history, unspecified trimester: Secondary | ICD-10-CM

## 2023-01-12 ENCOUNTER — Ambulatory Visit (INDEPENDENT_AMBULATORY_CARE_PROVIDER_SITE_OTHER): Payer: Medicaid Other | Admitting: Obstetrics and Gynecology

## 2023-01-12 ENCOUNTER — Other Ambulatory Visit: Payer: Self-pay

## 2023-01-12 VITALS — BP 111/73 | HR 96 | Wt 125.5 lb

## 2023-01-12 DIAGNOSIS — O09893 Supervision of other high risk pregnancies, third trimester: Secondary | ICD-10-CM

## 2023-01-12 DIAGNOSIS — O09892 Supervision of other high risk pregnancies, second trimester: Secondary | ICD-10-CM

## 2023-01-12 DIAGNOSIS — B009 Herpesviral infection, unspecified: Secondary | ICD-10-CM

## 2023-01-12 DIAGNOSIS — Z3480 Encounter for supervision of other normal pregnancy, unspecified trimester: Secondary | ICD-10-CM

## 2023-01-12 DIAGNOSIS — Z8759 Personal history of other complications of pregnancy, childbirth and the puerperium: Secondary | ICD-10-CM

## 2023-01-12 DIAGNOSIS — L929 Granulomatous disorder of the skin and subcutaneous tissue, unspecified: Secondary | ICD-10-CM

## 2023-01-12 DIAGNOSIS — Z3A20 20 weeks gestation of pregnancy: Secondary | ICD-10-CM

## 2023-01-12 NOTE — Progress Notes (Signed)
   PRENATAL VISIT NOTE  Subjective:  Kristin Bruce is a 27 y.o. [phone] at [redacted]w[redacted]d being seen today for ongoing prenatal care.  She is currently monitored for the following issues for this high-risk pregnancy and has Herpes simplex type 1 infection; History of pre-eclampsia; Migraine; Alpha thalassemia silent carrier; Adjustment disorder with mixed anxiety and depressed mood; Anxiety state; Suicidal thoughts (2019); Hx of preterm delivery, currently pregnant, third trimester; and Supervision of other normal pregnancy, antepartum on their problem list.  Patient reports  concern over maybe having axilla HS .  Contractions: Not present. Vag. Bleeding: None.  Movement: Present. Denies leaking of fluid.   The following portions of the patient's history were reviewed and updated as appropriate: allergies, current medications, past family history, past medical history, past social history, past surgical history and problem list.   Objective:   Vitals:   01/12/23 1151  BP: 111/73  Pulse: 96  Weight: 125 lb 8 oz (56.9 kg)    Fetal Status: Fetal Heart Rate (bpm): 143   Movement: Present     General:  Alert, oriented and cooperative. Patient is in no acute distress.  Skin: Skin is warm and dry. No rash noted.   Cardiovascular: Normal heart rate noted  Respiratory: Normal respiratory effort, no problems with respiration noted  Abdomen: Soft, gravid, appropriate for gestational age.  Pain/Pressure: Absent     Pelvic: Cervical exam deferred        Extremities: Normal range of motion.  Edema: None  Mental Status: Normal mood and affect. Normal behavior. Normal judgment and thought content.  Left and right axilla with what looks like old healed areas of HS. In the left axilla is a pedunculated 1cm piece of granulation tissue, non weeping, appropriately ttp.  Assessment and Plan:  Pregnancy: [id number] at [redacted]w[redacted]d 1. Granulation tissue I offered lidocaine spray and silver nitrate to the area to see if it  can help but she declines; can consider next time. I also recommend stopping shaving and waxing and just trimming if she does do anything to her axilla  2. [redacted] weeks gestation of pregnancy F/u completion scan Ask about birth control next visit.   3. Hx of preterm delivery, currently pregnant, third trimester No interventions indicated  4. History of pre-eclampsia Continue low dose asa  5. History of herpes simplex type 1 infection  6. Supervision of other normal pregnancy, antepartum  Preterm labor symptoms and general obstetric precautions including but not limited to vaginal bleeding, contractions, leaking of fluid and fetal movement were reviewed in detail with the patient. Please refer to After Visit Summary for other counseling recommendations.   No follow-ups on file.  Future Appointments  Date Time Provider Department Center  02/09/2023  9:30 AM [facility] NURSE [facility]  02/09/2023  9:45 AM [facility] US[facility]  02/14/2023 10:55 AM Warden Fillers, MD [facility]    [place] Bing, MD

## 2023-01-12 NOTE — Patient Instructions (Signed)
Granulation tissue

## 2023-02-09 ENCOUNTER — Other Ambulatory Visit: Payer: Self-pay | Admitting: *Deleted

## 2023-02-09 ENCOUNTER — Ambulatory Visit: Payer: Medicaid Other | Attending: Maternal & Fetal Medicine

## 2023-02-09 ENCOUNTER — Ambulatory Visit: Payer: Medicaid Other | Admitting: *Deleted

## 2023-02-09 VITALS — BP 112/73 | HR 79

## 2023-02-09 DIAGNOSIS — O98512 Other viral diseases complicating pregnancy, second trimester: Secondary | ICD-10-CM | POA: Diagnosis not present

## 2023-02-09 DIAGNOSIS — Z8759 Personal history of other complications of pregnancy, childbirth and the puerperium: Secondary | ICD-10-CM

## 2023-02-09 DIAGNOSIS — O358XX0 Maternal care for other (suspected) fetal abnormality and damage, not applicable or unspecified: Secondary | ICD-10-CM | POA: Diagnosis not present

## 2023-02-09 DIAGNOSIS — B009 Herpesviral infection, unspecified: Secondary | ICD-10-CM

## 2023-02-09 DIAGNOSIS — Z148 Genetic carrier of other disease: Secondary | ICD-10-CM | POA: Diagnosis not present

## 2023-02-09 DIAGNOSIS — O35BXX0 Maternal care for other (suspected) fetal abnormality and damage, fetal cardiac anomalies, not applicable or unspecified: Secondary | ICD-10-CM | POA: Diagnosis not present

## 2023-02-09 DIAGNOSIS — O09292 Supervision of pregnancy with other poor reproductive or obstetric history, second trimester: Secondary | ICD-10-CM | POA: Diagnosis not present

## 2023-02-09 DIAGNOSIS — Z362 Encounter for other antenatal screening follow-up: Secondary | ICD-10-CM | POA: Insufficient documentation

## 2023-02-09 DIAGNOSIS — O09893 Supervision of other high risk pregnancies, third trimester: Secondary | ICD-10-CM

## 2023-02-09 DIAGNOSIS — O09212 Supervision of pregnancy with history of pre-term labor, second trimester: Secondary | ICD-10-CM | POA: Insufficient documentation

## 2023-02-09 DIAGNOSIS — O09892 Supervision of other high risk pregnancies, second trimester: Secondary | ICD-10-CM | POA: Insufficient documentation

## 2023-02-09 DIAGNOSIS — D563 Thalassemia minor: Secondary | ICD-10-CM

## 2023-02-09 DIAGNOSIS — O09299 Supervision of pregnancy with other poor reproductive or obstetric history, unspecified trimester: Secondary | ICD-10-CM

## 2023-02-09 DIAGNOSIS — Z3480 Encounter for supervision of other normal pregnancy, unspecified trimester: Secondary | ICD-10-CM

## 2023-02-09 DIAGNOSIS — A6000 Herpesviral infection of urogenital system, unspecified: Secondary | ICD-10-CM | POA: Insufficient documentation

## 2023-02-09 DIAGNOSIS — Z3A24 24 weeks gestation of pregnancy: Secondary | ICD-10-CM | POA: Diagnosis not present

## 2023-02-09 DIAGNOSIS — O09899 Supervision of other high risk pregnancies, unspecified trimester: Secondary | ICD-10-CM | POA: Insufficient documentation

## 2023-02-09 DIAGNOSIS — O98312 Other infections with a predominantly sexual mode of transmission complicating pregnancy, second trimester: Secondary | ICD-10-CM | POA: Insufficient documentation

## 2023-02-09 DIAGNOSIS — O99013 Anemia complicating pregnancy, third trimester: Secondary | ICD-10-CM

## 2023-02-14 ENCOUNTER — Other Ambulatory Visit: Payer: Self-pay

## 2023-02-14 ENCOUNTER — Ambulatory Visit (INDEPENDENT_AMBULATORY_CARE_PROVIDER_SITE_OTHER): Payer: Medicaid Other | Admitting: Obstetrics and Gynecology

## 2023-02-14 VITALS — BP 105/73 | HR 79 | Wt 129.8 lb

## 2023-02-14 DIAGNOSIS — Z8759 Personal history of other complications of pregnancy, childbirth and the puerperium: Secondary | ICD-10-CM

## 2023-02-14 DIAGNOSIS — B009 Herpesviral infection, unspecified: Secondary | ICD-10-CM

## 2023-02-14 DIAGNOSIS — D563 Thalassemia minor: Secondary | ICD-10-CM

## 2023-02-14 DIAGNOSIS — Z3A25 25 weeks gestation of pregnancy: Secondary | ICD-10-CM

## 2023-02-14 DIAGNOSIS — Z3480 Encounter for supervision of other normal pregnancy, unspecified trimester: Secondary | ICD-10-CM

## 2023-02-14 DIAGNOSIS — O09893 Supervision of other high risk pregnancies, third trimester: Secondary | ICD-10-CM

## 2023-02-14 NOTE — Progress Notes (Signed)
   PRENATAL VISIT NOTE  Subjective:  Kristin Bruce is a 27 y.o. [phone] at [redacted]w[redacted]d being seen today for ongoing prenatal care.  She is currently monitored for the following issues for this low-risk pregnancy and has Herpes simplex type 1 infection; History of pre-eclampsia; Migraine; Alpha thalassemia silent carrier; Adjustment disorder with mixed anxiety and depressed mood; Anxiety state; Suicidal thoughts (2019); Hx of preterm delivery, currently pregnant, third trimester; and Supervision of other normal pregnancy, antepartum on their problem list.  Patient doing well with no acute concerns today. She reports .  Contractions: Not present. Vag. Bleeding: None.  Movement: Present. Denies leaking of fluid.   Briefly discussed birth control options including patch, depo provera, nexplanon and IUD  The following portions of the patient's history were reviewed and updated as appropriate: allergies, current medications, past family history, past medical history, past social history, past surgical history and problem list. Problem list updated.  Objective:   Vitals:   02/14/23 1120  BP: 105/73  Pulse: 79  Weight: 129 lb 12.8 oz (58.9 kg)    Fetal Status: Fetal Heart Rate (bpm): 146 Fundal Height: 25 cm Movement: Present     General:  Alert, oriented and cooperative. Patient is in no acute distress.  Skin: Skin is warm and dry. No rash noted.   Cardiovascular: Normal heart rate noted  Respiratory: Normal respiratory effort, no problems with respiration noted  Abdomen: Soft, gravid, appropriate for gestational age.  Pain/Pressure: Absent     Pelvic: Cervical exam deferred        Extremities: Normal range of motion.  Edema: None  Mental Status:  Normal mood and affect. Normal behavior. Normal judgment and thought content.   Assessment and Plan:  Pregnancy: [id number] at [redacted]w[redacted]d  1. [redacted] weeks gestation of pregnancy   2. Supervision of other normal pregnancy, antepartum Continue routine  prenatal care  3. Hx of preterm delivery, currently pregnant, third trimester No s/sx of preterm labor  4. History of pre-eclampsia Compliant with baby ASA, BP WNL  5. Herpes simplex type 1 infection No recent outbreak, prophylaxis at 36 weeks  6. Alpha thalassemia silent carrier   Preterm labor symptoms and general obstetric precautions including but not limited to vaginal bleeding, contractions, leaking of fluid and fetal movement were reviewed in detail with the patient.  Please refer to After Visit Summary for other counseling recommendations.   Return in about 3 weeks (around 03/07/2023) for ROB, in person, 2 hr GTT, 3rd trim labs.   Mariel Aloe, MD Faculty Attending Center for [facility]

## 2023-03-09 ENCOUNTER — Other Ambulatory Visit: Payer: Self-pay

## 2023-03-09 DIAGNOSIS — Z3480 Encounter for supervision of other normal pregnancy, unspecified trimester: Secondary | ICD-10-CM

## 2023-03-10 ENCOUNTER — Other Ambulatory Visit: Payer: Medicaid Other

## 2023-03-10 ENCOUNTER — Ambulatory Visit (INDEPENDENT_AMBULATORY_CARE_PROVIDER_SITE_OTHER): Payer: Medicaid Other | Admitting: Obstetrics and Gynecology

## 2023-03-10 ENCOUNTER — Other Ambulatory Visit: Payer: Self-pay

## 2023-03-10 VITALS — BP 110/75 | HR 80 | Wt 132.4 lb

## 2023-03-10 DIAGNOSIS — Z3A28 28 weeks gestation of pregnancy: Secondary | ICD-10-CM

## 2023-03-10 DIAGNOSIS — Z3480 Encounter for supervision of other normal pregnancy, unspecified trimester: Secondary | ICD-10-CM

## 2023-03-10 DIAGNOSIS — B009 Herpesviral infection, unspecified: Secondary | ICD-10-CM

## 2023-03-10 DIAGNOSIS — D563 Thalassemia minor: Secondary | ICD-10-CM

## 2023-03-10 DIAGNOSIS — O09893 Supervision of other high risk pregnancies, third trimester: Secondary | ICD-10-CM

## 2023-03-10 DIAGNOSIS — Z8759 Personal history of other complications of pregnancy, childbirth and the puerperium: Secondary | ICD-10-CM

## 2023-03-10 DIAGNOSIS — Z23 Encounter for immunization: Secondary | ICD-10-CM

## 2023-03-10 NOTE — Progress Notes (Signed)
   PRENATAL VISIT NOTE  Subjective:  Kristin Bruce is a 27 y.o. [phone] at [redacted]w[redacted]d being seen today for ongoing prenatal care.  She is currently monitored for the following issues for this low-risk pregnancy and has Herpes simplex type 1 infection; History of pre-eclampsia; Migraine; Alpha thalassemia silent carrier; Adjustment disorder with mixed anxiety and depressed mood; Anxiety state; Suicidal thoughts (2019); Hx of preterm delivery, currently pregnant, third trimester; Supervision of other normal pregnancy, antepartum; and Short interval between pregnancies affecting pregnancy in third trimester, antepartum on their problem list.  Patient reports no complaints.  Contractions: Not present. Vag. Bleeding: None.   . Denies leaking of fluid.   The following portions of the patient's history were reviewed and updated as appropriate: allergies, current medications, past family history, past medical history, past social history, past surgical history and problem list.   Objective:   Vitals:   03/10/23 0816  BP: 110/75  Pulse: 80  Weight: 132 lb 6.4 oz (60.1 kg)    Fetal Status: Fetal Heart Rate (bpm): 145         General:  Alert, oriented and cooperative. Patient is in no acute distress.  Skin: Skin is warm and dry. No rash noted.   Cardiovascular: Normal heart rate noted  Respiratory: Normal respiratory effort, no problems with respiration noted  Abdomen: Soft, gravid, appropriate for gestational age.  Pain/Pressure: Absent     Pelvic: Cervical exam deferred        Extremities: Normal range of motion.  Edema: None  Mental Status: Normal mood and affect. Normal behavior. Normal judgment and thought content.   Assessment and Plan:  Pregnancy: [id number] at [redacted]w[redacted]d 1. Supervision of other normal pregnancy, antepartum 28wk labs today. Desires patch 7/24: 20%671g, ac 23% - Tdap vaccine greater than or equal to 7yo IM  2. [redacted] weeks gestation of pregnancy  3. Herpes simplex type 1  infection Consider prophylaxis at 36wks  4. History of pre-eclampsia Continue low dose ASA  5. Hx of preterm delivery, currently pregnant, third trimester H/o 36wk PPROM  6. Short interval between pregnancies affecting pregnancy in third trimester, antepartum  7. Alpha thalassemia silent carrier - Ferritin  Preterm labor symptoms and general obstetric precautions including but not limited to vaginal bleeding, contractions, leaking of fluid and fetal movement were reviewed in detail with the patient. Please refer to After Visit Summary for other counseling recommendations.   No follow-ups on file.  Future Appointments  Date Time Provider Department Center  03/10/2023  8:50 AM [facility] LAB [facility]  03/23/2023  9:45 AM [facility] NURSE [facility]  03/23/2023 10:00 AM [facility] US1 [facility]  03/28/2023  3:55 PM Leftwich-Kirby, Wilmer Floor, CNM [facility]    [place] Bing, MD

## 2023-03-11 LAB — CBC
Hematocrit: 27.2 % — ABNORMAL LOW (ref 34.0–46.6)
Hemoglobin: 8.7 g/dL — ABNORMAL LOW (ref 11.1–15.9)
MCH: 27.4 pg (ref 26.6–33.0)
MCHC: 32 g/dL (ref 31.5–35.7)
MCV: 86 fL (ref 79–97)
Platelets: 285 10*3/uL (ref 150–450)
RBC: 3.18 x10E6/uL — ABNORMAL LOW (ref 3.77–5.28)
RDW: 12.5 % (ref 11.7–15.4)
WBC: 6.6 10*3/uL (ref 3.4–10.8)

## 2023-03-11 LAB — HIV ANTIBODY (ROUTINE TESTING W REFLEX): HIV Screen 4th Generation wRfx: NONREACTIVE

## 2023-03-11 LAB — GLUCOSE TOLERANCE, 2 HOURS W/ 1HR
Glucose, 1 hour: 115 mg/dL (ref 70–179)
Glucose, 2 hour: 71 mg/dL (ref 70–152)
Glucose, Fasting: 71 mg/dL (ref 70–91)

## 2023-03-11 LAB — FERRITIN: Ferritin: 8 ng/mL — ABNORMAL LOW (ref 15–150)

## 2023-03-11 LAB — RPR: RPR Ser Ql: NONREACTIVE

## 2023-03-13 ENCOUNTER — Other Ambulatory Visit: Payer: Self-pay | Admitting: Obstetrics and Gynecology

## 2023-03-13 ENCOUNTER — Encounter: Payer: Self-pay | Admitting: Obstetrics and Gynecology

## 2023-03-14 ENCOUNTER — Telehealth: Payer: Self-pay

## 2023-03-14 NOTE — Telephone Encounter (Signed)
Auth Submission: NO AUTH NEEDED Site of care: Site of care: [facility] Payer: Medicaid Medication & CPT/J Code(s) submitted: Venofer (Iron Sucrose) J1756  Auth type: Buy/Bill PB Units/visits requested: 300mg  x 3 doses  Approval from: 03/14/23 to 07/14/23

## 2023-03-17 DIAGNOSIS — Z0289 Encounter for other administrative examinations: Secondary | ICD-10-CM

## 2023-03-21 ENCOUNTER — Encounter: Payer: Self-pay | Admitting: Obstetrics and Gynecology

## 2023-03-23 ENCOUNTER — Ambulatory Visit: Payer: Medicaid Other | Attending: Maternal & Fetal Medicine

## 2023-03-23 ENCOUNTER — Other Ambulatory Visit: Payer: Self-pay | Admitting: *Deleted

## 2023-03-23 ENCOUNTER — Ambulatory Visit: Payer: Medicaid Other | Admitting: *Deleted

## 2023-03-23 VITALS — BP 104/67 | HR 75

## 2023-03-23 DIAGNOSIS — Z8759 Personal history of other complications of pregnancy, childbirth and the puerperium: Secondary | ICD-10-CM

## 2023-03-23 DIAGNOSIS — O09893 Supervision of other high risk pregnancies, third trimester: Secondary | ICD-10-CM

## 2023-03-23 DIAGNOSIS — Z3480 Encounter for supervision of other normal pregnancy, unspecified trimester: Secondary | ICD-10-CM | POA: Insufficient documentation

## 2023-03-23 DIAGNOSIS — O321XX0 Maternal care for breech presentation, not applicable or unspecified: Secondary | ICD-10-CM | POA: Diagnosis not present

## 2023-03-23 DIAGNOSIS — B009 Herpesviral infection, unspecified: Secondary | ICD-10-CM

## 2023-03-23 DIAGNOSIS — O99013 Anemia complicating pregnancy, third trimester: Secondary | ICD-10-CM

## 2023-03-23 DIAGNOSIS — O09213 Supervision of pregnancy with history of pre-term labor, third trimester: Secondary | ICD-10-CM

## 2023-03-23 DIAGNOSIS — O09293 Supervision of pregnancy with other poor reproductive or obstetric history, third trimester: Secondary | ICD-10-CM | POA: Insufficient documentation

## 2023-03-23 DIAGNOSIS — Z3A30 30 weeks gestation of pregnancy: Secondary | ICD-10-CM | POA: Insufficient documentation

## 2023-03-23 DIAGNOSIS — D563 Thalassemia minor: Secondary | ICD-10-CM

## 2023-03-23 DIAGNOSIS — O98513 Other viral diseases complicating pregnancy, third trimester: Secondary | ICD-10-CM | POA: Insufficient documentation

## 2023-03-23 DIAGNOSIS — O35BXX0 Maternal care for other (suspected) fetal abnormality and damage, fetal cardiac anomalies, not applicable or unspecified: Secondary | ICD-10-CM | POA: Diagnosis not present

## 2023-03-23 DIAGNOSIS — O09299 Supervision of pregnancy with other poor reproductive or obstetric history, unspecified trimester: Secondary | ICD-10-CM

## 2023-03-28 ENCOUNTER — Encounter: Payer: Medicaid Other | Admitting: Advanced Practice Midwife

## 2023-03-31 ENCOUNTER — Ambulatory Visit (INDEPENDENT_AMBULATORY_CARE_PROVIDER_SITE_OTHER): Payer: Medicaid Other | Admitting: *Deleted

## 2023-03-31 VITALS — BP 98/65 | HR 93 | Temp 98.4°F | Resp 18 | Ht 60.0 in | Wt 134.0 lb

## 2023-03-31 DIAGNOSIS — O99013 Anemia complicating pregnancy, third trimester: Secondary | ICD-10-CM | POA: Diagnosis not present

## 2023-03-31 DIAGNOSIS — Z3A31 31 weeks gestation of pregnancy: Secondary | ICD-10-CM | POA: Diagnosis not present

## 2023-03-31 DIAGNOSIS — D508 Other iron deficiency anemias: Secondary | ICD-10-CM | POA: Diagnosis not present

## 2023-03-31 MED ORDER — DIPHENHYDRAMINE HCL 25 MG PO CAPS: 25.0000 mg | ORAL_CAPSULE | Freq: Once | ORAL | Status: DC

## 2023-03-31 MED ORDER — ACETAMINOPHEN 325 MG PO TABS: 650.0000 mg | ORAL_TABLET | Freq: Once | ORAL | Status: DC

## 2023-03-31 MED ORDER — SODIUM CHLORIDE 0.9 % IV SOLN
300.0000 mg | Freq: Once | INTRAVENOUS | Status: AC
  Administered 2023-03-31: 300 mg via INTRAVENOUS
  Filled 2023-03-31: qty 15

## 2023-03-31 NOTE — Progress Notes (Signed)
Diagnosis: Acute Anemia  Provider:  Chilton Greathouse MD  Procedure: IV Infusion  IV Type: Peripheral, IV Location: L Antecubital  Venofer (Iron Sucrose), Dose: 300 mg  Infusion Start Time: 0926  Infusion Stop Time: 1105 am  Post Infusion IV Care: Observation period completed and Peripheral IV Discontinued  Discharge: Condition: Good, Destination: Home . AVS Declined  Performed by:  Wyvonne Lenz, RN

## 2023-04-04 ENCOUNTER — Encounter: Payer: Medicaid Other | Admitting: Obstetrics and Gynecology

## 2023-04-07 ENCOUNTER — Ambulatory Visit: Payer: Medicaid Other

## 2023-04-07 MED ORDER — SODIUM CHLORIDE 0.9 % IV SOLN
300.0000 mg | Freq: Once | INTRAVENOUS | Status: DC
  Filled 2023-04-07: qty 15

## 2023-04-07 MED ORDER — DIPHENHYDRAMINE HCL 25 MG PO CAPS: 25.0000 mg | ORAL_CAPSULE | Freq: Once | ORAL | Status: DC

## 2023-04-07 MED ORDER — ACETAMINOPHEN 325 MG PO TABS: 650.0000 mg | ORAL_TABLET | Freq: Once | ORAL | Status: DC

## 2023-04-12 ENCOUNTER — Inpatient Hospital Stay (HOSPITAL_COMMUNITY)
Admission: AD | Admit: 2023-04-12 | Discharge: 2023-04-12 | Disposition: A | Discharge status: Home / Self Care | Payer: Medicaid Other | Attending: Obstetrics and Gynecology | Admitting: Obstetrics and Gynecology

## 2023-04-12 ENCOUNTER — Encounter (HOSPITAL_COMMUNITY): Payer: Self-pay | Admitting: Obstetrics and Gynecology

## 2023-04-12 DIAGNOSIS — M545 Low back pain, unspecified: Secondary | ICD-10-CM | POA: Diagnosis not present

## 2023-04-12 DIAGNOSIS — Z3A33 33 weeks gestation of pregnancy: Secondary | ICD-10-CM | POA: Insufficient documentation

## 2023-04-12 DIAGNOSIS — R102 Pelvic and perineal pain: Secondary | ICD-10-CM | POA: Insufficient documentation

## 2023-04-12 DIAGNOSIS — N949 Unspecified condition associated with female genital organs and menstrual cycle: Secondary | ICD-10-CM

## 2023-04-12 DIAGNOSIS — O26893 Other specified pregnancy related conditions, third trimester: Secondary | ICD-10-CM | POA: Diagnosis not present

## 2023-04-12 LAB — URINALYSIS, ROUTINE W REFLEX MICROSCOPIC
Bilirubin Urine: NEGATIVE
Glucose, UA: NEGATIVE mg/dL
Hgb urine dipstick: NEGATIVE
Ketones, ur: 80 mg/dL — AB
Nitrite: NEGATIVE
Protein, ur: 30 mg/dL — AB
Specific Gravity, Urine: 1.027 (ref 1.005–1.030)
pH: 6 (ref 5.0–8.0)

## 2023-04-12 NOTE — MAU Provider Note (Signed)
History     CSN: [id number]  Arrival date and time: 04/12/23 1709   Event Date/Time   First Provider Initiated Contact with Patient 04/12/23 1805      Chief Complaint  Patient presents with   Abdominal Pain   Back Pain   Pelvic Pain   HPI  [name] Is a 27 y.o. female [id number] @ [redacted]w[redacted]d here in MAU with pelvic pain, and lower back pain. The symptoms have been present for a while. She works and stays on her feet a lot during the day. The pain in her pelvis is worse following work days. She has applied a heating pad that has not helped. She has no n/v, fever or chills.   OB History     Gravida  6   Para  4   Term  2   Preterm  2   AB  1   Living  4      SAB  1   IAB  0   Ectopic  0   Multiple  0   Live Births  4           Past Medical History:  Diagnosis Date   Adjustment disorder with depressed mood 11/22/2015   Anemia    Anemia affecting pregnancy in third trimester 04/14/2022   Anxiety    Depression    Encounter for elective induction of labor 06/06/2022   GBS (group B streptococcus) infection    History of preterm delivery, currently pregnant 11/18/2021   HSV (herpes simplex virus) anogenital infection    Suicidal thoughts    during 1st pregnancy   Supervision of high risk pregnancy, antepartum 11/18/2021          Nursing Staff  Provider  Office Location     Dating   LMP = 10 wk [place]  [facility] Model  [x]  Traditional  [ ]  Centering  [ ]  Mom-Baby Dyad        Language   English  Anatomy US   normal  Flu Vaccine   Declined  Genetic/Carrier Screen   NIPS:   Low risk female  AFP:   normal  Horizon:  TDaP Vaccine    03/31/22  Hgb A1C or   GTT  Early 4.2  Third trimester   COVID Vaccine  none     LAB RESULTS   Rhogam    SVD (spontaneous vaginal delivery) 04/29/2021   Wears glasses     Past Surgical History:  Procedure Laterality Date   INCISION AND DRAINAGE ABSCESS N/A 05/13/2018   Procedure: INCISION AND DRAINAGE, PILONIDAL  ABSCESS;  Surgeon: Jimmye Norman, MD;  Location: [facility] OR;  Service: General;  Laterality: N/A;   INCISION AND DRAINAGE PERIRECTAL ABSCESS N/A 04/01/2018   Procedure: IRRIGATION AND DEBRIDEMENT PERIRECTAL ABSCESS;  Surgeon: Violeta Gelinas, MD;  Location[facility];  Service: General;  Laterality: N/A;   PILONIDAL CYST EXCISION N/A 07/20/2018   Procedure: EXCISION OF PILONIDAL DISEASE;  Surgeon: Andria Meuse, MD;  Location: [facility];  Service: General;  Laterality: N/A;    Family History  Problem Relation Age of Onset   Diabetes Mother    Hypertension Mother    Heart failure Mother    Stroke Mother    Diabetes Maternal Grandfather     Social History   Tobacco Use   Smoking status: Never   Smokeless tobacco: Never  Vaping Use   Vaping status: Never Used  Substance Use Topics   Alcohol use:  Not Currently    Comment: not since pregnancy   Drug use: Not Currently    Types: Marijuana    Comment: no using for 2 months    Allergies: No Known Allergies  Medications Prior to Admission  Medication Sig Dispense Refill Last Dose   acetaminophen (TYLENOL) 500 MG tablet Take 500-1,000 mg by mouth every 6 (six) hours as needed for mild pain, headache or fever.   Past Week   aspirin EC 81 MG tablet Take 1 tablet (81 mg total) by mouth daily. 60 tablet 2 04/12/2023   Prenatal 27-1 MG TABS Take 1 tablet by mouth daily. 30 tablet 10 04/12/2023   hydrOXYzine (ATARAX) 25 MG tablet Take 25 mg by mouth 3 (three) times daily as needed.      Results for orders placed or performed during the hospital encounter of 04/12/23 (from the past 48 hour(s))  Urinalysis, Routine w reflex microscopic -Urine, Clean Catch     Status: Abnormal   Collection Time: 04/12/23  5:29 PM  Result Value Ref Range   Color, Urine AMBER (A) YELLOW    Comment: BIOCHEMICALS MAY BE AFFECTED BY COLOR   APPearance HAZY (A) CLEAR   Specific Gravity, Urine 1.027 1.005 - 1.030   pH 6.0 5.0 - 8.0   Glucose, UA NEGATIVE NEGATIVE mg/dL    Hgb urine dipstick NEGATIVE NEGATIVE   Bilirubin Urine NEGATIVE NEGATIVE   Ketones, ur 80 (A) NEGATIVE mg/dL   Protein, ur 30 (A) NEGATIVE mg/dL   Nitrite NEGATIVE NEGATIVE   Leukocytes,Ua TRACE (A) NEGATIVE   RBC / HPF 0-5 0 - 5 RBC/hpf   WBC, UA 0-5 0 - 5 WBC/hpf   Bacteria, UA RARE (A) NONE SEEN   Squamous Epithelial / HPF 11-20 0 - 5 /HPF   Mucus PRESENT     Comment: Performed at [facility], [address] [place], [place] [address]     Review of Systems  Constitutional:  Negative for fever.  Gastrointestinal:  Positive for abdominal pain.  Genitourinary:  Negative for flank pain.   Physical Exam   Blood pressure 105/68, pulse 80, temperature 98.4 F (36.9 C), temperature source Oral, resp. rate 16, last menstrual period 08/20/2022, SpO2 100%, unknown if currently breastfeeding.  Physical Exam Constitutional:      General: She is not in acute distress.    Appearance: She is well-developed. She is not ill-appearing, toxic-appearing or diaphoretic.  HENT:     Head: Normocephalic.  Eyes:     Pupils: Pupils are equal, round, and reactive to light.  Genitourinary:    Comments: Cervix, closed, thick (50%), posterior  Exam by Venia Carbon, NP  Skin:    General: Skin is warm.  Neurological:     Mental Status: She is alert.    Fetal Tracing: Baseline: 130 bpm Variability: Moderate  Accelerations: 15x15 Decelerations: None Toco: UI  MAU Course  Procedures  MDM  Urine culture pending UA with 80 ketones, she was given a liter of fluid to drink PO.  Assessment and Plan   1. Pelvic pressure in female   2. Round ligament pain   3. [redacted] weeks gestation of pregnancy      P:  Dc home Increase oral fluid Work note given for remainder of today Pregnancy support belt recommended Return to MAU if symptoms worsen  Tyrica Afzal, Harolyn Rutherford, NP 04/12/2023 8:37 PM

## 2023-04-12 NOTE — MAU Note (Signed)
.  Kristin Bruce is a 27 y.o. at [redacted]w[redacted]d here in MAU reporting: intermittent, dull pain that has been on-going for the past week in her lower abdomen, back, pelvic area, and buttocks. The pain got worse and became more consistent this morning. The pain gets worse with movement, she has been trying to lay on her heating pad with little relief. She is unsure if they are contractions. She thinks the pain may be due to being on her feet a lot at work.  Denies VB or LOF. Reports +FM.  Onset of complaint: On-going for past week Pain score: 7/10 Vitals:   04/12/23 1732  BP: 105/70  Pulse: 83  Resp: 16  Temp: 98.4 F (36.9 C)  SpO2: 100%     FHT:135   Lab orders placed from triage:  UA

## 2023-04-14 ENCOUNTER — Other Ambulatory Visit: Payer: Self-pay

## 2023-04-14 ENCOUNTER — Ambulatory Visit (INDEPENDENT_AMBULATORY_CARE_PROVIDER_SITE_OTHER): Payer: Medicaid Other | Admitting: Obstetrics and Gynecology

## 2023-04-14 ENCOUNTER — Encounter: Payer: Self-pay | Admitting: Obstetrics and Gynecology

## 2023-04-14 ENCOUNTER — Ambulatory Visit: Payer: Medicaid Other

## 2023-04-14 ENCOUNTER — Encounter: Payer: Self-pay | Admitting: *Deleted

## 2023-04-14 VITALS — BP 105/73 | HR 92 | Wt 133.6 lb

## 2023-04-14 DIAGNOSIS — Z3480 Encounter for supervision of other normal pregnancy, unspecified trimester: Secondary | ICD-10-CM

## 2023-04-14 DIAGNOSIS — Z8759 Personal history of other complications of pregnancy, childbirth and the puerperium: Secondary | ICD-10-CM

## 2023-04-14 DIAGNOSIS — O09893 Supervision of other high risk pregnancies, third trimester: Secondary | ICD-10-CM

## 2023-04-14 DIAGNOSIS — B009 Herpesviral infection, unspecified: Secondary | ICD-10-CM

## 2023-04-14 DIAGNOSIS — Z3A33 33 weeks gestation of pregnancy: Secondary | ICD-10-CM

## 2023-04-14 DIAGNOSIS — D563 Thalassemia minor: Secondary | ICD-10-CM

## 2023-04-14 LAB — CULTURE, OB URINE

## 2023-04-14 MED ORDER — FERROUS GLUCONATE 324 (38 FE) MG PO TABS
324.0000 mg | ORAL_TABLET | ORAL | 3 refills | Status: DC
Start: 2023-04-14 — End: 2023-05-10

## 2023-04-14 MED ORDER — DIPHENHYDRAMINE HCL 25 MG PO CAPS: 25.0000 mg | ORAL_CAPSULE | Freq: Once | ORAL | Status: DC

## 2023-04-14 MED ORDER — ACETAMINOPHEN 325 MG PO TABS: 650.0000 mg | ORAL_TABLET | Freq: Once | ORAL | Status: DC

## 2023-04-14 MED ORDER — SODIUM CHLORIDE 0.9 % IV SOLN
300.0000 mg | Freq: Once | INTRAVENOUS | Status: DC
  Filled 2023-04-14: qty 15

## 2023-04-14 NOTE — Progress Notes (Signed)
Subjective:  Kristin Bruce is a 27 y.o. [phone] at [redacted]w[redacted]d being seen today for ongoing prenatal care.  She is currently monitored for the following issues for this high-risk pregnancy and has Anemia in pregnancy, third trimester; Herpes simplex type 1 infection; History of pre-eclampsia; Alpha thalassemia silent carrier; Adjustment disorder with mixed anxiety and depressed mood; Suicidal thoughts (2019); Hx of preterm delivery, currently pregnant, third trimester; Supervision of other normal pregnancy, antepartum; and Short interval between pregnancies affecting pregnancy in third trimester, antepartum on their problem list.  Patient reports  general discomforts of pregnancy .  Contractions: Irritability. Vag. Bleeding: None.  Movement: Present. Denies leaking of fluid.   The following portions of the patient's history were reviewed and updated as appropriate: allergies, current medications, past family history, past medical history, past social history, past surgical history and problem list. Problem list updated.  Objective:   Vitals:   04/14/23 1435  BP: 105/73  Pulse: 92  Weight: 133 lb 9.6 oz (60.6 kg)    Fetal Status: Fetal Heart Rate (bpm): 136   Movement: Present     General:  Alert, oriented and cooperative. Patient is in no acute distress.  Skin: Skin is warm and dry. No rash noted.   Cardiovascular: Normal heart rate noted  Respiratory: Normal respiratory effort, no problems with respiration noted  Abdomen: Soft, gravid, appropriate for gestational age. Pain/Pressure: Present (pain and pressure lower ABD)     Pelvic:  Cervical exam performed        Extremities: Normal range of motion.  Edema: None  Mental Status: Normal mood and affect. Normal behavior. Normal judgment and thought content.   Urinalysis:      Assessment and Plan:  Pregnancy: [id number] at [redacted]w[redacted]d  1. Supervision of other normal pregnancy, antepartum Stable  2. Hx of preterm delivery, currently pregnant, third  trimester SROM at 36 weeks  3. History of pre-eclampsia BP normal No S/Sx at present  4. Herpes simplex type 1 infection Suppression at 36 weeks  5. Alpha thalassemia silent carrier Stable Was able to get 1 iron infusion. Unable to get other 2 due to transportation issues - ferrous gluconate (FERGON) 324 MG tablet; Take 1 tablet (324 mg total) by mouth every other day.  Dispense: 30 tablet; Refill: 3  Preterm labor symptoms and general obstetric precautions including but not limited to vaginal bleeding, contractions, leaking of fluid and fetal movement were reviewed in detail with the patient. Please refer to After Visit Summary for other counseling recommendations.  Return in about 2 weeks (around 04/28/2023) for OB visit, face to face, any provider.   Hermina Staggers, MD

## 2023-04-20 ENCOUNTER — Ambulatory Visit: Payer: Medicaid Other

## 2023-04-21 ENCOUNTER — Ambulatory Visit: Payer: Medicaid Other | Attending: Obstetrics and Gynecology

## 2023-04-21 DIAGNOSIS — O09299 Supervision of pregnancy with other poor reproductive or obstetric history, unspecified trimester: Secondary | ICD-10-CM | POA: Insufficient documentation

## 2023-04-21 DIAGNOSIS — O09293 Supervision of pregnancy with other poor reproductive or obstetric history, third trimester: Secondary | ICD-10-CM

## 2023-04-21 DIAGNOSIS — Z3A34 34 weeks gestation of pregnancy: Secondary | ICD-10-CM

## 2023-04-21 DIAGNOSIS — O285 Abnormal chromosomal and genetic finding on antenatal screening of mother: Secondary | ICD-10-CM

## 2023-04-21 DIAGNOSIS — D563 Thalassemia minor: Secondary | ICD-10-CM

## 2023-04-21 DIAGNOSIS — O09213 Supervision of pregnancy with history of pre-term labor, third trimester: Secondary | ICD-10-CM | POA: Diagnosis not present

## 2023-04-21 DIAGNOSIS — O98513 Other viral diseases complicating pregnancy, third trimester: Secondary | ICD-10-CM | POA: Diagnosis not present

## 2023-04-21 DIAGNOSIS — B009 Herpesviral infection, unspecified: Secondary | ICD-10-CM

## 2023-04-21 DIAGNOSIS — O35BXX0 Maternal care for other (suspected) fetal abnormality and damage, fetal cardiac anomalies, not applicable or unspecified: Secondary | ICD-10-CM | POA: Diagnosis not present

## 2023-04-28 ENCOUNTER — Telehealth: Payer: Self-pay | Admitting: Family Medicine

## 2023-04-28 NOTE — Telephone Encounter (Signed)
Attempted to reach patient. Her voicemail is full.

## 2023-05-02 ENCOUNTER — Encounter: Payer: Medicaid Other | Admitting: Obstetrics and Gynecology

## 2023-05-03 ENCOUNTER — Other Ambulatory Visit (HOSPITAL_COMMUNITY)
Admission: RE | Admit: 2023-05-03 | Discharge: 2023-05-03 | Disposition: A | Discharge status: Home / Self Care | Payer: Medicaid Other | Source: Ambulatory Visit | Attending: Obstetrics and Gynecology | Admitting: Obstetrics and Gynecology

## 2023-05-03 ENCOUNTER — Other Ambulatory Visit: Payer: Self-pay

## 2023-05-03 ENCOUNTER — Ambulatory Visit (INDEPENDENT_AMBULATORY_CARE_PROVIDER_SITE_OTHER): Payer: Medicaid Other | Admitting: Obstetrics and Gynecology

## 2023-05-03 VITALS — BP 111/78 | HR 83 | Wt 132.0 lb

## 2023-05-03 DIAGNOSIS — B009 Herpesviral infection, unspecified: Secondary | ICD-10-CM

## 2023-05-03 DIAGNOSIS — D563 Thalassemia minor: Secondary | ICD-10-CM

## 2023-05-03 DIAGNOSIS — Z3480 Encounter for supervision of other normal pregnancy, unspecified trimester: Secondary | ICD-10-CM | POA: Diagnosis present

## 2023-05-03 DIAGNOSIS — O09893 Supervision of other high risk pregnancies, third trimester: Secondary | ICD-10-CM

## 2023-05-03 DIAGNOSIS — Z3A36 36 weeks gestation of pregnancy: Secondary | ICD-10-CM

## 2023-05-03 DIAGNOSIS — O99013 Anemia complicating pregnancy, third trimester: Secondary | ICD-10-CM

## 2023-05-03 DIAGNOSIS — Z23 Encounter for immunization: Secondary | ICD-10-CM

## 2023-05-03 DIAGNOSIS — Z8759 Personal history of other complications of pregnancy, childbirth and the puerperium: Secondary | ICD-10-CM

## 2023-05-03 NOTE — Progress Notes (Signed)
   PRENATAL VISIT NOTE  Subjective:  Kristin Bruce is a 27 y.o. [phone] at [redacted]w[redacted]d being seen today for ongoing prenatal care.  She is currently monitored for the following issues for this high-risk pregnancy and has Anemia in pregnancy, third trimester; Herpes simplex type 1 infection; History of pre-eclampsia; Alpha thalassemia silent carrier; Adjustment disorder with mixed anxiety and depressed mood; Suicidal thoughts (2019); Hx of preterm delivery, currently pregnant, third trimester; Supervision of other normal pregnancy, antepartum; and Short interval between pregnancies affecting pregnancy in third trimester, antepartum on their problem list.  Patient reports no complaints.  Contractions: Irritability. Vag. Bleeding: None.  Movement: Present. Denies leaking of fluid.   The following portions of the patient's history were reviewed and updated as appropriate: allergies, current medications, past family history, past medical history, past social history, past surgical history and problem list.   Objective:   Vitals:   05/03/23 0854  BP: 111/78  Pulse: 83  Weight: 132 lb (59.9 kg)    Fetal Status: Fetal Heart Rate (bpm): 136 Fundal Height: 35 cm Movement: Present     General:  Alert, oriented and cooperative. Patient is in no acute distress.  Skin: Skin is warm and dry. No rash noted.   Cardiovascular: Normal heart rate noted  Respiratory: Normal respiratory effort, no problems with respiration noted  Abdomen: Soft, gravid, appropriate for gestational age.  Pain/Pressure: Present     Pelvic: Cervical exam deferred Dilation: 3 Effacement (%): 60 Station: -2  Extremities: Normal range of motion.  Edema: None  Mental Status: Normal mood and affect. Normal behavior. Normal judgment and thought content.   Assessment and Plan:  Pregnancy: [id number] at [redacted]w[redacted]d 1. Supervision of other normal pregnancy, antepartum GBS done today RSV vaccine given today.  Declines flu shot.   2. Short  interval between pregnancies affecting pregnancy in third trimester, antepartum  3. Hx of preterm delivery, currently pregnant, third trimester 36 wk deliveries  4. History of pre-eclampsia BP today wnl.   5. Herpes simplex type 1 infection Has h/o cold cores, denies genital  6. Anemia in pregnancy, third trimester S/p IV iron  7. Alpha thalassemia silent carrier   Preterm labor symptoms and general obstetric precautions including but not limited to vaginal bleeding, contractions, leaking of fluid and fetal movement were reviewed in detail with the patient. Please refer to After Visit Summary for other counseling recommendations.   Return in about 1 week (around 05/10/2023) for OB VISIT, MD or APP.  No future appointments.   Milas Hock, MD

## 2023-05-04 ENCOUNTER — Encounter (HOSPITAL_COMMUNITY): Payer: Self-pay | Admitting: Obstetrics and Gynecology

## 2023-05-04 ENCOUNTER — Inpatient Hospital Stay (HOSPITAL_COMMUNITY)
Admission: AD | Admit: 2023-05-04 | Discharge: 2023-05-04 | Disposition: A | Discharge status: Home / Self Care | Payer: Medicaid Other | Attending: Obstetrics and Gynecology | Admitting: Obstetrics and Gynecology

## 2023-05-04 DIAGNOSIS — Z0371 Encounter for suspected problem with amniotic cavity and membrane ruled out: Secondary | ICD-10-CM | POA: Diagnosis present

## 2023-05-04 DIAGNOSIS — O4703 False labor before 37 completed weeks of gestation, third trimester: Secondary | ICD-10-CM | POA: Diagnosis present

## 2023-05-04 DIAGNOSIS — O479 False labor, unspecified: Secondary | ICD-10-CM

## 2023-05-04 DIAGNOSIS — Z3A36 36 weeks gestation of pregnancy: Secondary | ICD-10-CM

## 2023-05-04 DIAGNOSIS — Z8759 Personal history of other complications of pregnancy, childbirth and the puerperium: Secondary | ICD-10-CM

## 2023-05-04 DIAGNOSIS — O09893 Supervision of other high risk pregnancies, third trimester: Secondary | ICD-10-CM

## 2023-05-04 DIAGNOSIS — Z3480 Encounter for supervision of other normal pregnancy, unspecified trimester: Secondary | ICD-10-CM

## 2023-05-04 NOTE — MAU Note (Signed)
.  Kristin Bruce is a 27 y.o. at [redacted]w[redacted]d here in MAU reporting having ctxs for a few day. HAd appt yesterday and was 3cm and 60%. Awoke about 0015 with severe back pain and wanted to be sure she was not dilating. Reports good FM and denies LOF or VB. Plans on epidural. Having a lot of pelvic pressure  Onset of complaint: 0015 Pain score: 7 Vitals:   05/04/23 0047 05/04/23 0050  BP:  115/71  Pulse: 88   Resp: 17   Temp: 98.3 F (36.8 C)   SpO2: 100%      FHT:145 Lab orders placed from triage:  labor eval

## 2023-05-05 LAB — STREP GP B NAA: Strep Gp B NAA: NEGATIVE

## 2023-05-05 LAB — CERVICOVAGINAL ANCILLARY ONLY
Chlamydia: NEGATIVE
Comment: NEGATIVE
Comment: NORMAL
Neisseria Gonorrhea: NEGATIVE

## 2023-05-06 ENCOUNTER — Encounter (HOSPITAL_COMMUNITY): Payer: Self-pay | Admitting: Obstetrics & Gynecology

## 2023-05-06 ENCOUNTER — Inpatient Hospital Stay (HOSPITAL_COMMUNITY)
Admission: AD | Admit: 2023-05-06 | Discharge: 2023-05-06 | Disposition: A | Discharge status: Home / Self Care | Payer: Medicaid Other | Attending: Obstetrics & Gynecology | Admitting: Obstetrics & Gynecology

## 2023-05-06 DIAGNOSIS — Z3A37 37 weeks gestation of pregnancy: Secondary | ICD-10-CM | POA: Diagnosis not present

## 2023-05-06 DIAGNOSIS — Z8759 Personal history of other complications of pregnancy, childbirth and the puerperium: Secondary | ICD-10-CM

## 2023-05-06 DIAGNOSIS — O471 False labor at or after 37 completed weeks of gestation: Secondary | ICD-10-CM | POA: Insufficient documentation

## 2023-05-06 DIAGNOSIS — O479 False labor, unspecified: Secondary | ICD-10-CM

## 2023-05-06 DIAGNOSIS — Z3480 Encounter for supervision of other normal pregnancy, unspecified trimester: Secondary | ICD-10-CM

## 2023-05-06 DIAGNOSIS — O09893 Supervision of other high risk pregnancies, third trimester: Secondary | ICD-10-CM

## 2023-05-06 NOTE — MAU Provider Note (Cosign Needed)
  S: Kristin Bruce is a 27 y.o. [id number] at [redacted]w[redacted]d  who presents to MAU today complaining of intermittent contractions. She denies vaginal bleeding. She denies LOF. She reports normal fetal movement.    O: BP 110/72   Pulse 98   Temp 98.2 F (36.8 C) (Oral)   Resp 14   Ht 5' (1.524 m)   Wt 60.3 kg   LMP 08/20/2022   BMI 25.97 kg/m  GENERAL: Well-developed, well-nourished female in no acute distress.  HEAD: Normocephalic, atraumatic.  CHEST: Normal effort of breathing, regular heart rate ABDOMEN: Soft, nontender, gravid  Cervical exam:  Dilation: 3 Effacement (%): 50, 60 Cervical Position: Posterior, Middle Station: -3 Exam by:: A Rodrigeuz RN   Fetal Monitoring: Baseline: 145 Variability: moderate Accelerations: 15*15 Decelerations: absent Contractions: uterine irritability   A: SIUP at [redacted]w[redacted]d  False labor Cat I FHR tracing  Uterine irritability noted   P: Discharge home with labor precautions Continue routine prenatal care as scheduled   This patient's plan of care has been discussed with the OB fellow Dr. Leanora Cover. Please see attestation.   Charma Igo, MD 05/06/2023 11:05 PM   GME ATTESTATION:  Evaluation and management procedures were performed by the [facility] Medicine Resident under my supervision. I was immediately available for direct supervision, assistance and direction throughout this encounter.  I also confirm that I have verified the information documented in the resident's note, and that I have also personally reperformed the pertinent components of the physical exam and all of the medical decision making activities.  I have also made any necessary editorial changes.  Wyn Forster, MD OB Fellow, Faculty Practice [facility], Center for [facility] 05/07/2023 1:49 AM

## 2023-05-06 NOTE — MAU Note (Addendum)
Pt says UC's stronger than Tuesday night - 8/10 PNC- clinic  GBS- neg Baby moving

## 2023-05-09 ENCOUNTER — Encounter: Payer: Self-pay | Admitting: Obstetrics and Gynecology

## 2023-05-10 ENCOUNTER — Other Ambulatory Visit: Payer: Self-pay

## 2023-05-10 ENCOUNTER — Encounter: Payer: Self-pay | Admitting: Family Medicine

## 2023-05-10 ENCOUNTER — Ambulatory Visit (INDEPENDENT_AMBULATORY_CARE_PROVIDER_SITE_OTHER): Payer: Medicaid Other | Admitting: Family Medicine

## 2023-05-10 VITALS — BP 107/70 | HR 92 | Wt 133.3 lb

## 2023-05-10 DIAGNOSIS — Z3A37 37 weeks gestation of pregnancy: Secondary | ICD-10-CM

## 2023-05-10 DIAGNOSIS — F4323 Adjustment disorder with mixed anxiety and depressed mood: Secondary | ICD-10-CM

## 2023-05-10 DIAGNOSIS — Z3480 Encounter for supervision of other normal pregnancy, unspecified trimester: Secondary | ICD-10-CM

## 2023-05-10 DIAGNOSIS — O99013 Anemia complicating pregnancy, third trimester: Secondary | ICD-10-CM

## 2023-05-10 DIAGNOSIS — Z8759 Personal history of other complications of pregnancy, childbirth and the puerperium: Secondary | ICD-10-CM

## 2023-05-10 DIAGNOSIS — O09893 Supervision of other high risk pregnancies, third trimester: Secondary | ICD-10-CM

## 2023-05-10 DIAGNOSIS — B009 Herpesviral infection, unspecified: Secondary | ICD-10-CM

## 2023-05-10 NOTE — Progress Notes (Signed)
   Subjective:  Kristin Bruce is a 27 y.o. [id number] at [redacted]w[redacted]d being seen today for ongoing prenatal care.  She is currently monitored for the following issues for this high-risk pregnancy and has Anemia in pregnancy, third trimester; Herpes simplex type 1 infection; History of pre-eclampsia; Alpha thalassemia silent carrier; Adjustment disorder with mixed anxiety and depressed mood; Suicidal thoughts (2019); Hx of preterm delivery, currently pregnant, third trimester; Supervision of other normal pregnancy, antepartum; and Short interval between pregnancies affecting pregnancy in third trimester, antepartum on their problem list.  Patient reports no complaints.  Contractions: Irregular. Vag. Bleeding: None.  Movement: Present. Denies leaking of fluid.   The following portions of the patient's history were reviewed and updated as appropriate: allergies, current medications, past family history, past medical history, past social history, past surgical history and problem list. Problem list updated.  Objective:   Vitals:   05/10/23 0829  BP: 107/70  Pulse: 92  Weight: 133 lb 4.8 oz (60.5 kg)    Fetal Status: Fetal Heart Rate (bpm): 130   Movement: Present     General:  Alert, oriented and cooperative. Patient is in no acute distress.  Skin: Skin is warm and dry. No rash noted.   Cardiovascular: Normal heart rate noted  Respiratory: Normal respiratory effort, no problems with respiration noted  Abdomen: Soft, gravid, appropriate for gestational age. Pain/Pressure: Present     Pelvic: Vag. Bleeding: None     Cervical exam performed        Extremities: Normal range of motion.  Edema: None  Mental Status: Normal mood and affect. Normal behavior. Normal judgment and thought content.   Urinalysis:      Assessment and Plan:  Pregnancy: [id number] at [redacted]w[redacted]d  1. Supervision of other normal pregnancy, antepartum BP and FHR normal Cervix 3/thick/-2 Asked about elective IOL, discussed our practice  is not currently scheduling for that, will need to wait for 41 wk medical IOL, but she has never gone past 39 weeks before  2. Hx of preterm delivery, currently pregnant, third trimester 36 weeks x2  3. History of pre-eclampsia Not taking ASA Normotensive  4. Herpes simplex type 1 infection Not on valtrex, per chart has hx of GU HSV, patient reports no GU outbreaks  5. Anemia in pregnancy, third trimester S/p IV venofer  6. Short interval between pregnancies affecting pregnancy in third trimester, antepartum Deliveries 04/2021 and 05/2022  7. Adjustment disorder with mixed anxiety and depressed mood Not addressed at this visit  Term labor symptoms and general obstetric precautions including but not limited to vaginal bleeding, contractions, leaking of fluid and fetal movement were reviewed in detail with the patient. Please refer to After Visit Summary for other counseling recommendations.  Return in 1 week (on 05/17/2023) for [facility], ob visit.   Venora Maples, MD

## 2023-05-10 NOTE — Patient Instructions (Signed)

## 2023-05-19 ENCOUNTER — Ambulatory Visit: Payer: Medicaid Other | Admitting: Family Medicine

## 2023-05-19 ENCOUNTER — Encounter: Payer: Self-pay | Admitting: Family Medicine

## 2023-05-19 VITALS — BP 108/78 | HR 79 | Wt 133.0 lb

## 2023-05-19 DIAGNOSIS — Z3A38 38 weeks gestation of pregnancy: Secondary | ICD-10-CM

## 2023-05-19 DIAGNOSIS — Z3480 Encounter for supervision of other normal pregnancy, unspecified trimester: Secondary | ICD-10-CM

## 2023-05-19 DIAGNOSIS — O99013 Anemia complicating pregnancy, third trimester: Secondary | ICD-10-CM

## 2023-05-19 DIAGNOSIS — O09893 Supervision of other high risk pregnancies, third trimester: Secondary | ICD-10-CM

## 2023-05-19 DIAGNOSIS — Z8759 Personal history of other complications of pregnancy, childbirth and the puerperium: Secondary | ICD-10-CM

## 2023-05-19 DIAGNOSIS — B009 Herpesviral infection, unspecified: Secondary | ICD-10-CM

## 2023-05-19 NOTE — Progress Notes (Signed)
   Subjective:  AEVA ALSMAN is a 27 y.o. [id number] at [redacted]w[redacted]d being seen today for ongoing prenatal care.  She is currently monitored for the following issues for this high-risk pregnancy and has Anemia in pregnancy, third trimester; Herpes simplex type 1 infection; History of pre-eclampsia; Alpha thalassemia silent carrier; Adjustment disorder with mixed anxiety and depressed mood; Suicidal thoughts (2019); Hx of preterm delivery, currently pregnant, third trimester; Supervision of other normal pregnancy, antepartum; and Short interval between pregnancies affecting pregnancy in third trimester, antepartum on their problem list.  Patient reports no complaints.  Contractions: Irritability. Vag. Bleeding: None.  Movement: Present. Denies leaking of fluid.   The following portions of the patient's history were reviewed and updated as appropriate: allergies, current medications, past family history, past medical history, past social history, past surgical history and problem list. Problem list updated.  Objective:   Vitals:   05/19/23 1054  BP: 108/78  Pulse: 79  Weight: 133 lb (60.3 kg)    Fetal Status: Fetal Heart Rate (bpm): 138   Movement: Present     General:  Alert, oriented and cooperative. Patient is in no acute distress.  Skin: Skin is warm and dry. No rash noted.   Cardiovascular: Normal heart rate noted  Respiratory: Normal respiratory effort, no problems with respiration noted  Abdomen: Soft, gravid, appropriate for gestational age. Pain/Pressure: Present     Pelvic: Vag. Bleeding: None     Cervical exam deferred Dilation: 3 Effacement (%): Thick Station: -2  Extremities: Normal range of motion.  Edema: None  Mental Status: Normal mood and affect. Normal behavior. Normal judgment and thought content.   Urinalysis:      Assessment and Plan:  Pregnancy: [id number] at [redacted]w[redacted]d  1. Supervision of other normal pregnancy, antepartum BP and FHR normal Cervix 3/thick/-2 IOL scheduled  for 41 weeks   2. Hx of preterm delivery, currently pregnant, third trimester 36 weeks x2   3. History of pre-eclampsia Not taking ASA Normotensive   4. Herpes simplex type 1 infection Not on valtrex, per chart has hx of GU HSV, patient reports no GU outbreaks   5. Anemia in pregnancy, third trimester S/p IV venofer   6. Short interval between pregnancies affecting pregnancy in third trimester, antepartum Deliveries 04/2021 and 05/2022  Term labor symptoms and general obstetric precautions including but not limited to vaginal bleeding, contractions, leaking of fluid and fetal movement were reviewed in detail with the patient. Please refer to After Visit Summary for other counseling recommendations.  Return in 1 week (on 05/26/2023) for [facility], ob visit.   Venora Maples, MD

## 2023-05-19 NOTE — Patient Instructions (Signed)

## 2023-05-20 ENCOUNTER — Encounter: Payer: Self-pay | Admitting: Obstetrics and Gynecology

## 2023-05-21 ENCOUNTER — Inpatient Hospital Stay (HOSPITAL_COMMUNITY)
Admission: AD | Admit: 2023-05-21 | Discharge: 2023-05-23 | DRG: 806 | Disposition: A | Discharge status: Home / Self Care | Payer: Medicaid Other | Attending: Obstetrics and Gynecology | Admitting: Obstetrics and Gynecology

## 2023-05-21 ENCOUNTER — Inpatient Hospital Stay (HOSPITAL_COMMUNITY): Payer: Medicaid Other | Admitting: Anesthesiology

## 2023-05-21 ENCOUNTER — Other Ambulatory Visit: Payer: Self-pay

## 2023-05-21 ENCOUNTER — Encounter (HOSPITAL_COMMUNITY): Payer: Self-pay | Admitting: Obstetrics and Gynecology

## 2023-05-21 DIAGNOSIS — O9902 Anemia complicating childbirth: Secondary | ICD-10-CM | POA: Diagnosis present

## 2023-05-21 DIAGNOSIS — Z3A39 39 weeks gestation of pregnancy: Secondary | ICD-10-CM

## 2023-05-21 DIAGNOSIS — A6000 Herpesviral infection of urogenital system, unspecified: Secondary | ICD-10-CM | POA: Diagnosis present

## 2023-05-21 DIAGNOSIS — Z8249 Family history of ischemic heart disease and other diseases of the circulatory system: Secondary | ICD-10-CM

## 2023-05-21 DIAGNOSIS — O9832 Other infections with a predominantly sexual mode of transmission complicating childbirth: Secondary | ICD-10-CM

## 2023-05-21 DIAGNOSIS — Z833 Family history of diabetes mellitus: Secondary | ICD-10-CM | POA: Diagnosis not present

## 2023-05-21 DIAGNOSIS — Z3480 Encounter for supervision of other normal pregnancy, unspecified trimester: Secondary | ICD-10-CM

## 2023-05-21 DIAGNOSIS — K219 Gastro-esophageal reflux disease without esophagitis: Secondary | ICD-10-CM | POA: Diagnosis present

## 2023-05-21 DIAGNOSIS — Z5982 Transportation insecurity: Secondary | ICD-10-CM | POA: Diagnosis not present

## 2023-05-21 DIAGNOSIS — F4323 Adjustment disorder with mixed anxiety and depressed mood: Secondary | ICD-10-CM | POA: Diagnosis present

## 2023-05-21 DIAGNOSIS — R1030 Lower abdominal pain, unspecified: Secondary | ICD-10-CM | POA: Diagnosis not present

## 2023-05-21 DIAGNOSIS — O9962 Diseases of the digestive system complicating childbirth: Secondary | ICD-10-CM | POA: Diagnosis present

## 2023-05-21 DIAGNOSIS — B009 Herpesviral infection, unspecified: Secondary | ICD-10-CM | POA: Diagnosis present

## 2023-05-21 DIAGNOSIS — O09893 Supervision of other high risk pregnancies, third trimester: Secondary | ICD-10-CM

## 2023-05-21 DIAGNOSIS — O99013 Anemia complicating pregnancy, third trimester: Secondary | ICD-10-CM | POA: Diagnosis present

## 2023-05-21 DIAGNOSIS — O480 Post-term pregnancy: Secondary | ICD-10-CM | POA: Diagnosis present

## 2023-05-21 DIAGNOSIS — O4202 Full-term premature rupture of membranes, onset of labor within 24 hours of rupture: Secondary | ICD-10-CM

## 2023-05-21 DIAGNOSIS — Z823 Family history of stroke: Secondary | ICD-10-CM

## 2023-05-21 DIAGNOSIS — O99344 Other mental disorders complicating childbirth: Secondary | ICD-10-CM | POA: Diagnosis present

## 2023-05-21 DIAGNOSIS — Z148 Genetic carrier of other disease: Secondary | ICD-10-CM

## 2023-05-21 DIAGNOSIS — Z8759 Personal history of other complications of pregnancy, childbirth and the puerperium: Secondary | ICD-10-CM

## 2023-05-21 DIAGNOSIS — O26893 Other specified pregnancy related conditions, third trimester: Secondary | ICD-10-CM | POA: Diagnosis present

## 2023-05-21 DIAGNOSIS — D563 Thalassemia minor: Secondary | ICD-10-CM | POA: Diagnosis present

## 2023-05-21 LAB — CBC
HCT: 33.3 % — ABNORMAL LOW (ref 36.0–46.0)
Hemoglobin: 9.8 g/dL — ABNORMAL LOW (ref 12.0–15.0)
MCH: 25.5 pg — ABNORMAL LOW (ref 26.0–34.0)
MCHC: 29.4 g/dL — ABNORMAL LOW (ref 30.0–36.0)
MCV: 86.7 fL (ref 80.0–100.0)
Platelets: 282 10*3/uL (ref 150–400)
RBC: 3.84 MIL/uL — ABNORMAL LOW (ref 3.87–5.11)
RDW: 15.2 % (ref 11.5–15.5)
WBC: 6.2 10*3/uL (ref 4.0–10.5)
nRBC: 0 % (ref 0.0–0.2)

## 2023-05-21 LAB — COMPREHENSIVE METABOLIC PANEL
ALT: 30 U/L (ref 0–44)
AST: 23 U/L (ref 15–41)
Albumin: 2.6 g/dL — ABNORMAL LOW (ref 3.5–5.0)
Alkaline Phosphatase: 203 U/L — ABNORMAL HIGH (ref 38–126)
Anion gap: 12 (ref 5–15)
BUN: 9 mg/dL (ref 6–20)
CO2: 18 mmol/L — ABNORMAL LOW (ref 22–32)
Calcium: 8.3 mg/dL — ABNORMAL LOW (ref 8.9–10.3)
Chloride: 104 mmol/L (ref 98–111)
Creatinine, Ser: 0.51 mg/dL (ref 0.44–1.00)
GFR, Estimated: >60 mL/min (ref >60)
Glucose, Bld: 85 mg/dL (ref 70–99)
Potassium: 3.8 mmol/L (ref 3.5–5.1)
Sodium: 134 mmol/L — ABNORMAL LOW (ref 135–145)
Total Bilirubin: 1.4 mg/dL — ABNORMAL HIGH (ref 0.3–1.2)
Total Protein: 6.3 g/dL — ABNORMAL LOW (ref 6.5–8.1)

## 2023-05-21 LAB — TYPE AND SCREEN
ABO/RH(D): O POS
Antibody Screen: NEGATIVE

## 2023-05-21 LAB — RPR: RPR Ser Ql: NONREACTIVE

## 2023-05-21 MED ORDER — OXYTOCIN BOLUS FROM INFUSION
333.0000 mL | Freq: Once | INTRAVENOUS | Status: AC
  Administered 2023-05-21: 333 mL via INTRAVENOUS

## 2023-05-21 MED ORDER — OXYTOCIN-SODIUM CHLORIDE 30-0.9 UT/500ML-% IV SOLN: 1.0000 m[IU]/min | INTRAVENOUS | Status: DC

## 2023-05-21 MED ORDER — COCONUT OIL OIL: 1.0000 | TOPICAL_OIL | Status: DC | PRN

## 2023-05-21 MED ORDER — OXYCODONE HCL 5 MG PO TABS
10.0000 mg | ORAL_TABLET | Freq: Four times a day (QID) | ORAL | Status: DC | PRN
  Administered 2023-05-21 – 2023-05-22 (×4): 10 mg via ORAL
  Filled 2023-05-21 (×5): qty 2

## 2023-05-21 MED ORDER — EPHEDRINE 5 MG/ML INJ: 10.0000 mg | INTRAVENOUS | Status: DC | PRN

## 2023-05-21 MED ORDER — BENZOCAINE-MENTHOL 20-0.5 % EX AERO: 1.0000 | INHALATION_SPRAY | CUTANEOUS | Status: DC | PRN

## 2023-05-21 MED ORDER — DIBUCAINE (PERIANAL) 1 % EX OINT: 1.0000 | TOPICAL_OINTMENT | CUTANEOUS | Status: DC | PRN

## 2023-05-21 MED ORDER — PHENYLEPHRINE 80 MCG/ML (10ML) SYRINGE FOR IV PUSH (FOR BLOOD PRESSURE SUPPORT)
80.0000 ug | PREFILLED_SYRINGE | INTRAVENOUS | Status: DC | PRN
  Filled 2023-05-21: qty 10

## 2023-05-21 MED ORDER — DIPHENHYDRAMINE HCL 25 MG PO CAPS: 25.0000 mg | ORAL_CAPSULE | Freq: Four times a day (QID) | ORAL | Status: DC | PRN

## 2023-05-21 MED ORDER — DIPHENHYDRAMINE HCL 50 MG/ML IJ SOLN
12.5000 mg | INTRAMUSCULAR | Status: DC | PRN
Start: 2023-05-21 — End: 2023-05-21

## 2023-05-21 MED ORDER — ZOLPIDEM TARTRATE 5 MG PO TABS: 5.0000 mg | ORAL_TABLET | Freq: Every evening | ORAL | Status: DC | PRN

## 2023-05-21 MED ORDER — SOD CITRATE-CITRIC ACID 500-334 MG/5ML PO SOLN: 30.0000 mL | ORAL | Status: DC | PRN

## 2023-05-21 MED ORDER — LIDOCAINE HCL (PF) 1 % IJ SOLN: 30.0000 mL | INTRAMUSCULAR | Status: DC | PRN

## 2023-05-21 MED ORDER — FENTANYL-BUPIVACAINE-NACL 0.5-0.125-0.9 MG/250ML-% EP SOLN
12.0000 mL/h | EPIDURAL | Status: DC | PRN
  Administered 2023-05-21: 10 mL/h via EPIDURAL

## 2023-05-21 MED ORDER — ONDANSETRON HCL 4 MG/2ML IJ SOLN: 4.0000 mg | INTRAMUSCULAR | Status: DC | PRN

## 2023-05-21 MED ORDER — FENTANYL-BUPIVACAINE-NACL 0.5-0.125-0.9 MG/250ML-% EP SOLN
EPIDURAL | Status: AC
  Filled 2023-05-21: qty 250

## 2023-05-21 MED ORDER — FENTANYL CITRATE (PF) 100 MCG/2ML IJ SOLN
50.0000 ug | INTRAMUSCULAR | Status: DC | PRN
  Administered 2023-05-21: 50 ug via INTRAVENOUS
  Filled 2023-05-21 (×2): qty 2

## 2023-05-21 MED ORDER — ONDANSETRON HCL 4 MG/2ML IJ SOLN
4.0000 mg | Freq: Four times a day (QID) | INTRAMUSCULAR | Status: DC | PRN
  Administered 2023-05-21: 4 mg via INTRAVENOUS
  Filled 2023-05-21: qty 2

## 2023-05-21 MED ORDER — PHENYLEPHRINE 80 MCG/ML (10ML) SYRINGE FOR IV PUSH (FOR BLOOD PRESSURE SUPPORT)
80.0000 ug | PREFILLED_SYRINGE | INTRAVENOUS | Status: DC | PRN
Start: 2023-05-21 — End: 2023-05-21
  Administered 2023-05-21: 80 ug via INTRAVENOUS

## 2023-05-21 MED ORDER — OXYTOCIN-SODIUM CHLORIDE 30-0.9 UT/500ML-% IV SOLN
2.5000 [IU]/h | INTRAVENOUS | Status: DC
Start: 2023-05-21 — End: 2023-05-21
  Administered 2023-05-21: 2.5 [IU]/h via INTRAVENOUS
  Filled 2023-05-21: qty 500

## 2023-05-21 MED ORDER — TERBUTALINE SULFATE 1 MG/ML IJ SOLN: 0.2500 mg | Freq: Once | INTRAMUSCULAR | Status: DC | PRN

## 2023-05-21 MED ORDER — MEDROXYPROGESTERONE ACETATE 150 MG/ML IM SUSP: 150.0000 mg | INTRAMUSCULAR | Status: DC | PRN

## 2023-05-21 MED ORDER — LACTATED RINGERS IV BOLUS
1000.0000 mL | Freq: Once | INTRAVENOUS | Status: AC
Start: 2023-05-21 — End: 2023-05-21
  Administered 2023-05-21: 1000 mL via INTRAVENOUS

## 2023-05-21 MED ORDER — ACETAMINOPHEN 325 MG PO TABS: 650.0000 mg | ORAL_TABLET | ORAL | Status: DC | PRN

## 2023-05-21 MED ORDER — LIDOCAINE HCL (PF) 1 % IJ SOLN
INTRAMUSCULAR | Status: DC | PRN
  Administered 2023-05-21 (×2): 4 mL via EPIDURAL

## 2023-05-21 MED ORDER — PRENATAL MULTIVITAMIN CH
1.0000 | ORAL_TABLET | Freq: Every day | ORAL | Status: DC
  Administered 2023-05-22: 1 via ORAL
  Filled 2023-05-21 (×2): qty 1

## 2023-05-21 MED ORDER — SIMETHICONE 80 MG PO CHEW: 80.0000 mg | CHEWABLE_TABLET | ORAL | Status: DC | PRN

## 2023-05-21 MED ORDER — IBUPROFEN 800 MG PO TABS
800.0000 mg | ORAL_TABLET | Freq: Three times a day (TID) | ORAL | Status: DC
  Administered 2023-05-21 – 2023-05-23 (×6): 800 mg via ORAL
  Filled 2023-05-21 (×6): qty 1

## 2023-05-21 MED ORDER — ONDANSETRON HCL 4 MG PO TABS: 4.0000 mg | ORAL_TABLET | ORAL | Status: DC | PRN

## 2023-05-21 MED ORDER — LACTATED RINGERS IV SOLN
500.0000 mL | Freq: Once | INTRAVENOUS | Status: AC
  Administered 2023-05-21: 500 mL via INTRAVENOUS

## 2023-05-21 MED ORDER — OXYCODONE HCL 5 MG PO TABS: 5.0000 mg | ORAL_TABLET | Freq: Four times a day (QID) | ORAL | Status: DC | PRN

## 2023-05-21 MED ORDER — SENNOSIDES-DOCUSATE SODIUM 8.6-50 MG PO TABS
2.0000 | ORAL_TABLET | Freq: Every day | ORAL | Status: DC
  Administered 2023-05-22: 2 via ORAL
  Filled 2023-05-21 (×2): qty 2

## 2023-05-21 MED ORDER — WITCH HAZEL-GLYCERIN EX PADS: 1.0000 | MEDICATED_PAD | CUTANEOUS | Status: DC | PRN

## 2023-05-21 MED ORDER — ACETAMINOPHEN 500 MG PO TABS
1000.0000 mg | ORAL_TABLET | Freq: Three times a day (TID) | ORAL | Status: DC
  Administered 2023-05-21 – 2023-05-23 (×6): 1000 mg via ORAL
  Filled 2023-05-21 (×6): qty 2

## 2023-05-21 MED ORDER — LACTATED RINGERS IV SOLN: INTRAVENOUS | Status: DC

## 2023-05-21 NOTE — Plan of Care (Signed)

## 2023-05-21 NOTE — Discharge Summary (Shared)
Postpartum Discharge Summary  Date of Service updated***     Patient Name: Kristin Bruce DOB: 1996-02-16 MRN: [id number]  Date of admission: 05/21/2023 Delivery date:05/21/2023 Delivering provider: Joanne Gavel Date of discharge: 05/21/2023  Admitting diagnosis: Post-dates pregnancy [O48.0] Intrauterine pregnancy: [redacted]w[redacted]d     Secondary diagnosis:  Principal Problem:   NSVD (normal spontaneous vaginal delivery) Active Problems:   Anemia in pregnancy, third trimester   Herpes simplex type 1 infection   History of pre-eclampsia   Alpha thalassemia silent carrier   Adjustment disorder with mixed anxiety and depressed mood   Hx of preterm delivery, currently pregnant, third trimester   Supervision of other normal pregnancy, antepartum   Short interval between pregnancies affecting pregnancy in third trimester, antepartum  Additional problems: ***    Discharge diagnosis: Term Pregnancy Delivered                                              Post partum procedures:*** Augmentation: AROM Complications: None  Hospital course: Onset of Labor With Vaginal Delivery      27 y.o. yo [id number] at [redacted]w[redacted]d was admitted in Active Labor on 05/21/2023. Labor course was complicated by none.  Membrane Rupture Time/Date: 7:25 AM,05/21/2023  Delivery Method:Vaginal, Spontaneous Operative Delivery:N/A Episiotomy: None Lacerations:  None Patient had a postpartum course complicated by ***.  She is ambulating, tolerating a regular diet, passing flatus, and urinating well. Patient is discharged home in stable condition on 05/21/23.  Newborn Data: Birth date:05/21/2023 Birth time:9:12 AM Gender:Female Living status:Living Apgars:9 ,9  Weight:   Magnesium Sulfate received: No BMZ received: No Rhophylac:No MMR:No T-DaP:Given prenatally Flu: {ONG:29528} RSV Vaccine received: Yes Transfusion:{Transfusion received:30440034}  Immunizations received: Immunization History  Administered Date(s)  Administered   Rsv, Bivalent, Protein Subunit Rsvpref,pf Verdis Frederickson) 05/03/2023   Tdap 01/06/2016, 10/28/2017, 03/18/2021, 03/31/2022, 03/10/2023    Physical exam  Vitals:   05/21/23 0730 05/21/23 0805 05/21/23 0830 05/21/23 0915  BP: 114/73 122/86 110/74 101/62  Pulse: 70 87 79 74  Resp:      Temp:      TempSrc:      SpO2:      Weight:      Height:       General: {Exam; general:21111117} Lochia: {Desc; appropriate/inappropriate:30686::"appropriate"} Uterine Fundus: {Desc; firm/soft:30687} Incision: {Exam; incision:21111123} DVT Evaluation: {Exam; dvt:2111122} Labs: Lab Results  Component Value Date   WBC 6.2 05/21/2023   HGB 9.8 (L) 05/21/2023   HCT 33.3 (L) 05/21/2023   MCV 86.7 05/21/2023   PLT 282 05/21/2023      Latest Ref Rng & Units 05/21/2023    5:18 AM  CMP  Glucose 70 - 99 mg/dL 85   BUN 6 - 20 mg/dL 9   Creatinine 4.13 - 2.44 mg/dL 0.10   Sodium 272 - 536 mmol/L 134   Potassium 3.5 - 5.1 mmol/L 3.8   Chloride 98 - 111 mmol/L 104   CO2 22 - 32 mmol/L 18   Calcium 8.9 - 10.3 mg/dL 8.3   Total Protein 6.5 - 8.1 g/dL 6.3   Total Bilirubin 0.3 - 1.2 mg/dL 1.4   Alkaline Phos 38 - 126 U/L 203   AST 15 - 41 U/L 23   ALT 0 - 44 U/L 30    Edinburgh Score:    06/06/2022   12:34 PM  Edinburgh Postnatal Depression Scale Screening  Tool  I have been able to laugh and see the funny side of things. 0  I have looked forward with enjoyment to things. 0  I have blamed myself unnecessarily when things went wrong. 0  I have been anxious or worried for no good reason. 0  I have felt scared or panicky for no good reason. 0  Things have been getting on top of me. 0  I have been so unhappy that I have had difficulty sleeping. 0  I have felt sad or miserable. 0  I have been so unhappy that I have been crying. 0  The thought of harming myself has occurred to me. 0  Edinburgh Postnatal Depression Scale Total 0   No data recorded  After visit meds:  Allergies as of  05/21/2023   No Known Allergies   Med Rec must be completed prior to using this [facility]***        Discharge home in stable condition Infant Feeding: Bottle Infant Disposition:{CHL IP OB HOME WITH MVHQIO:96295} Discharge instruction: per After Visit Summary and Postpartum booklet. Activity: Advance as tolerated. Pelvic rest for 6 weeks.  Diet: {OB MWUX:32440102} Future Appointments: Future Appointments  Date Time Provider Department Center  05/26/2023  8:15 AM Reva Bores, MD [facility]  06/02/2023  3:15 PM Milas Hock, MD [facility]   Follow up Visit:  Message sent to [facility] 11/2  Please schedule this patient for a In person postpartum visit in 6 weeks with the following provider: Any provider. Additional Postpartum F/U: None   Low risk pregnancy complicated by:  hx preE Delivery mode:  Vaginal, Spontaneous Anticipated Birth Control:   Patch   05/21/2023 Joanne Gavel, MD

## 2023-05-21 NOTE — MAU Note (Signed)

## 2023-05-21 NOTE — MAU Note (Signed)
.  Kristin Bruce is a 27 y.o. at [redacted]w[redacted]d here in MAU reporting: contractions that began around 0200 with increasing intensity-also states she is having bloody show. Denies SROM. Endorses + fetal movement  Contractions every: 3 minutes Onset of ctx: Yesterday   0200 Pain score:    ROM: Denies Vaginal Bleeding: bloody show  Last SVE: 4-5cm Labor Pain Management Plan: Planning epidural  Fetal Movement: Reports positive FM FHT: 131   Vitals:   05/21/23 0506  BP: 119/87  Pulse: 88  Resp: 16  Temp: 98.7 F (37.1 C)  SpO2: 100%     OB Office: Faculty GBS: Negative Lab orders placed from triage: MAU Labor Eval

## 2023-05-21 NOTE — H&P (Signed)
OBSTETRIC ADMISSION HISTORY AND PHYSICAL  Kristin Bruce is a 27 y.o. female [phone] with IUP at [redacted]w[redacted]d by [place] presenting for SOL. She reports +FMs, No LOF, no VB, no blurry vision, headaches or peripheral edema, and RUQ pain.  She plans on bottle feeding. She request patch for birth control. She received her prenatal care at [facility]   Dating: By [place] --->  Estimated Date of Delivery: 05/27/23  Sono:    @[redacted]w[redacted]d , CWD, normal anatomy, cephalic presentation, posterior lie, 2431g, 53% EFW   Prenatal History/Complications:  Patient Active Problem List   Diagnosis Date Noted   Post-dates pregnancy 05/21/2023   Short interval between pregnancies affecting pregnancy in third trimester, antepartum 03/10/2023   Supervision of other normal pregnancy, antepartum 12/15/2022   Hx of preterm delivery, currently pregnant, third trimester 01/11/2022   Suicidal thoughts (2019)    Adjustment disorder with mixed anxiety and depressed mood 11/02/2021   Alpha thalassemia silent carrier 12/09/2020   History of pre-eclampsia 09/30/2020   Herpes simplex type 1 infection 06/09/2017   Anemia in pregnancy, third trimester 01/07/2016        NURSING  PROVIDER  Office Location [facility] Dating by LMP  [facility] Model Traditional Anatomy U/S Normal, f/u as indicated  Initiated care at  [employer]  English               LAB RESULTS   Support Person Jaylen(FOB) Genetics NIPS:    LR         AFP:          NOT DONE        NT/IT (FT only)        Carrier Screen Horizon:  Rhogam  O/Positive/-- (05/29 1505) A1C/GTT Early:             Third trimester: normal  Flu Vaccine Declines      TDaP Vaccine  03/10/23 Blood Type O/Positive/-- (05/29 1505)  Covid Vaccine No Antibody Negative (05/29 1505)  RSV vaccine 05/03/23 Rubella 3.10 (05/29 1505)  Feeding Plan Bottle RPR Non Reactive (08/22 0804)  Contraception Patch HBsAg Negative (05/29 1505)  Circumcision Yes HIV Non Reactive (08/22 0804)  Pediatrician   [facility] HCVAb Non Reactive (05/29 1505)  Prenatal Classes            Pap       Diagnosis  Date Value Ref Range Status  11/20/2020     Final    - Negative for intraepithelial lesion or malignancy (NILM)    BTLConsent NA GC/CT Initial:             36wks: neg/neg  VBAC  Consent NA GBS For PCN allergy, check sensitivities            DME Rx [ X] BP cuff [ ]  Weight Scale Waterbirth  [ ]  Class [ ]  Consent [ ]  CNM visit  PHQ9 & GAD7 [  X] new OB [  ] 28 weeks  [  ] 36 weeks Induction  [ ]  Orders Entered [ ] Foley Y/N     Past Medical History: Past Medical History:  Diagnosis Date   Adjustment disorder with depressed mood 11/22/2015   Anemia    Anemia affecting pregnancy in third trimester 04/14/2022   Anxiety    Anxiety state 11/02/2021   Depression    Encounter for elective induction of labor 06/06/2022   GBS (  group B streptococcus) infection    History of preterm delivery, currently pregnant 11/18/2021   HSV (herpes simplex virus) anogenital infection    Migraine 10/30/2020   Suicidal thoughts    during 1st pregnancy   Supervision of high risk pregnancy, antepartum 11/18/2021          Nursing Staff  Provider  Office Location     Dating   LMP = 10 wk [place]  [facility] Model  [x]  Traditional  [ ]  Centering  [ ]  Mom[facility]Baby [facility]        Language   English  Anatomy US   normal  Flu Vaccine   Declined  Genetic/Carrier Screen   NIPS:   Low risk female  AFP:   normal  Horizon:  TDaP Vaccine    03/31/22  Hgb A1C or   GTT  Early 4.2  Third trimester   COVID Vaccine  none     LAB RESULTS   Rhogam    SVD (spontaneous vaginal delivery) 04/29/2021   Wears glasses     Past Surgical History: Past Surgical History:  Procedure Laterality Date   INCISION AND DRAINAGE ABSCESS N/A 05/13/2018   Procedure: INCISION AND DRAINAGE, PILONIDAL  ABSCESS;  Surgeon: Jimmye Norman, MD;  Location: [facility] OR;  Service: General;  Laterality: N/A;   INCISION AND DRAINAGE PERIRECTAL ABSCESS N/A 04/01/2018   Procedure:  IRRIGATION AND DEBRIDEMENT PERIRECTAL ABSCESS;  Surgeon: Violeta Gelinas, MD;  Location: [facility] OR;  Service: General;  Laterality: N/A;   PILONIDAL CYST EXCISION N/A 07/20/2018   Procedure: EXCISION OF PILONIDAL DISEASE;  Surgeon: Andria Meuse, MD;  Location: [facility];  Service: General;  Laterality: N/A;    Obstetrical History: OB History     Gravida  6   Para  4   Term  2   Preterm  2   AB  1   Living  4      SAB  1   IAB  0   Ectopic  0   Multiple  0   Live Births  4           Social History Social History   Socioeconomic History   Marital status: Single    Spouse name: Not on file   Number of children: 2   Years of education: Not on file   Highest education level: Not on file  Occupational History   Not on file  Tobacco Use   Smoking status: Never   Smokeless tobacco: Never  Vaping Use   Vaping status: Never Used  Substance and Sexual Activity   Alcohol use: Not Currently    Comment: not since pregnancy   Drug use: Not Currently    Types: Marijuana    Comment: no using for 2 months   Sexual activity: Yes    Birth control/protection: None  Other Topics Concern   Not on file  Social History Narrative   Not on file   Social Determinants of Health   Financial Resource Strain: Not on file  Food Insecurity: No Food Insecurity (05/21/2023)   Hunger Vital Sign    Worried About Running Out of Food in the Last Year: Never true    Ran Out of Food in the Last Year: Never true  Transportation Needs: Unmet Transportation Needs (05/21/2023)   PRAPARE - Administrator, Civil Service (Medical): Yes    Lack of Transportation (Non-Medical): No  Physical Activity: Not on file  Stress: Not on file  Social Connections: Not on file    Family History: Family History  Problem Relation Age of Onset   Diabetes Mother    Hypertension Mother    Heart failure Mother    Stroke Mother    Diabetes Maternal Grandfather      Allergies: No Known Allergies  Medications Prior to Admission  Medication Sig Dispense Refill Last Dose   aspirin EC 81 MG tablet Take 1 tablet (81 mg total) by mouth daily. (Patient not taking: Reported on 05/10/2023) 60 tablet 2    Prenatal 27-1 MG TABS Take 1 tablet by mouth daily. (Patient not taking: Reported on 05/10/2023) 30 tablet 10      Review of Systems   All systems reviewed and negative except as stated in HPI  Blood pressure 114/79, pulse 82, temperature (!) 97.5 F (36.4 C), temperature source Oral, resp. rate 16, height 5' (1.524 m), weight 60.3 kg, last menstrual period 08/20/2022, SpO2 100%, unknown if currently breastfeeding. General appearance: alert, cooperative, and appears stated age Lungs: clear to auscultation bilaterally Heart: regular rate and rhythm Abdomen: soft, non-tender; bowel sounds normal Pelvic: normal female genitalia, negative speculum exam on admission Extremities: Homans sign is negative, no sign of DVT Presentation: cephalic Fetal monitoringBaseline: 120 bpm, Variability: Fair (1-6 bpm), Accelerations: 10x10, and Decelerations: Absent Uterine activity q8-54mins Dilation: 4.5 Effacement (%): 70 Station: -2 Exam by:: Douglass Rivers RN   Prenatal labs: ABO, Rh: --/--/O POS (11/02 0518) Antibody: NEG (11/02 0518) Rubella: 3.10 (05/29 1505) RPR: Non Reactive (08/22 0804)  HBsAg: Negative (05/29 1505)  HIV: Non Reactive (08/22 0804)  GBS: Negative/-- (10/15 0857)  2 hr Glucola wnl Genetic screening  LR Anatomy US normal   Prenatal Transfer Tool  Maternal Diabetes: No Genetic Screening: Abnormal:  Results: Other: alpha thal silent carrier Maternal Ultrasounds/Referrals: Normal Fetal Ultrasounds or other Referrals:  None Maternal Substance Abuse:  No Significant Maternal Medications:  None Significant Maternal Lab Results:  Group B Strep negative Number of Prenatal Visits:greater than 3 verified prenatal visits Other Comments:   None  Results for orders placed or performed during the hospital encounter of 05/21/23 (from the past 24 hour(s))  CBC   Collection Time: 05/21/23  5:18 AM  Result Value Ref Range   WBC 6.2 4.0 - 10.5 K/uL   RBC 3.84 (L) 3.87 - 5.11 MIL/uL   Hemoglobin 9.8 (L) 12.0 - 15.0 g/dL   HCT 78.2 (L) 95.6 - 21.3 %   MCV 86.7 80.0 - 100.0 fL   MCH 25.5 (L) 26.0 - 34.0 pg   MCHC 29.4 (L) 30.0 - 36.0 g/dL   RDW 08.6 57.8 - 46.9 %   Platelets 282 150 - 400 K/uL   nRBC 0.0 0.0 - 0.2 %  Type and screen   Collection Time: 05/21/23  5:18 AM  Result Value Ref Range   ABO/RH(D) O POS    Antibody Screen NEG    Sample Expiration      05/24/2023,2359 Performed at [facility], [address], [place], [place] [address]     Patient Active Problem List   Diagnosis Date Noted   Post-dates pregnancy 05/21/2023   Short interval between pregnancies affecting pregnancy in third trimester, antepartum 03/10/2023   Supervision of other normal pregnancy, antepartum 12/15/2022   Hx of preterm delivery, currently pregnant, third trimester 01/11/2022   Suicidal thoughts (2019)    Adjustment disorder with mixed anxiety and depressed mood 11/02/2021   Alpha thalassemia silent carrier 12/09/2020   History  of pre-eclampsia 09/30/2020   Herpes simplex type 1 infection 06/09/2017   Anemia in pregnancy, third trimester 01/07/2016    Assessment/Plan:  CHIRSTINE DEFRAIN is a 27 y.o. [id number] at [redacted]w[redacted]d here for SOL.   #Labor:Pt wishes to get epidural placed but then open to AROM.  #Pain: Per patient request #FWB: Cat2 #ID:  GBS neg #MOF: bottle #MOC:Patch  #Hx of PreE: normotensive, not taking ASA -admission labs pending   #HSV: chart states GU HSV but denied by patient, not on valtrex   #Anemia: s/p IV Venofer - pending admission Hgb   #Adjustment disorder with mixed anxiety and depressed mood #Hx of SI (2019) - SW consult for mood   #Short interval pregnancies: deliveries 10/22, 05/2022, @[redacted]w[redacted]d ,  normal anatomy, 2431g, 53% EFW  #Alpha thal silent carrier  [name], MD  05/21/2023, 6:12 AM

## 2023-05-21 NOTE — Anesthesia Preprocedure Evaluation (Addendum)
Anesthesia Evaluation  Patient identified by MRN, date of birth, ID band Patient awake    Reviewed: Allergy & Precautions, NPO status , Patient's Chart, lab work & pertinent test results  Airway Mallampati: II  TM Distance: >3 FB Neck ROM: Full    Dental no notable dental hx.    Pulmonary neg pulmonary ROS   Pulmonary exam normal breath sounds clear to auscultation       Cardiovascular negative cardio ROS Normal cardiovascular exam Rhythm:Regular Rate:Normal     Neuro/Psych  Headaches PSYCHIATRIC DISORDERS Anxiety Depression       GI/Hepatic Neg liver ROS,GERD  ,,  Endo/Other  negative endocrine ROS    Renal/GU negative Renal ROS  negative genitourinary   Musculoskeletal negative musculoskeletal ROS (+)    Abdominal   Peds  Hematology  (+) Blood dyscrasia, anemia Collection Time: 05/21/23  5:18 AM      Result                                            Value                         Ref Range                      WBC                                               6.2                           4.0 - 10.5 K/uL                RBC                                               3.84 (L)                      3.87 - 5.11 MIL/uL             Hemoglobin                                        9.8 (L)                       12.0 - 15.0 g/dL               HCT                                               33.3 (L)                      36.0 - 46.0 %                  MCV  86.7                          80.0 - 100.0 fL                MCH                                               25.5 (L)                      26.0 - 34.0 pg                 MCHC                                              29.4 (L)                      30.0 - 36.0 g/dL               RDW                                               15.2                          11.5 - 15.5 %                  Platelets                                          282                           150 - 400 K/uL                 nRBC                                              0.0                           0.0 - 0.2 %                      Anesthesia Other Findings   Reproductive/Obstetrics (+) Pregnancy                             Anesthesia Physical Anesthesia Plan  ASA: 3  Anesthesia Plan: Epidural   Post-op Pain Management:    Induction:   PONV Risk Score and Plan: Treatment may vary due to age or medical condition  Airway Management Planned: Natural Airway  Additional Equipment: None and Fetal Monitoring  Intra-op Plan:   Post-operative Plan:   Informed Consent: I  have reviewed the patients History and Physical, chart, labs and discussed the procedure including the risks, benefits and alternatives for the proposed anesthesia with the patient or authorized representative who has indicated his/her understanding and acceptance.       Plan Discussed with: Anesthesiologist  Anesthesia Plan Comments:         Anesthesia Quick Evaluation

## 2023-05-21 NOTE — Anesthesia Postprocedure Evaluation (Signed)
Anesthesia Post Note  Patient: Kristin Bruce  Procedure(s) Performed: AN AD HOC LABOR EPIDURAL     Patient location during evaluation: Mother Baby Anesthesia Type: Epidural Level of consciousness: awake Pain management: satisfactory to patient Vital Signs Assessment: post-procedure vital signs reviewed and stable Respiratory status: spontaneous breathing Cardiovascular status: stable Anesthetic complications: no  No notable events documented.  Last Vitals:  Vitals:   05/21/23 1100 05/21/23 1206  BP: 113/82 116/81  Pulse: 73 76  Resp: 18 16  Temp: 36.9 C 36.7 C  SpO2: 100% 100%    Last Pain:  Vitals:   05/21/23 1206  TempSrc: Oral  PainSc: 0-No pain   Pain Goal: Patients Stated Pain Goal: 0 (05/21/23 0740)                 Cephus Shelling

## 2023-05-21 NOTE — Anesthesia Procedure Notes (Signed)
Epidural Patient location during procedure: OB Start time: 05/21/2023 6:31 AM End time: 05/21/2023 6:39 AM  Staffing Anesthesiologist: Mal Amabile, MD Performed: anesthesiologist   Preanesthetic Checklist Completed: patient identified, IV checked, site marked, risks and benefits discussed, surgical consent, monitors and equipment checked, pre-op evaluation and timeout performed  Epidural Patient position: sitting Prep: DuraPrep and site prepped and draped Patient monitoring: continuous pulse ox and blood pressure Approach: midline Location: L3-L4 Injection technique: LOR air  Needle:  Needle type: Tuohy  Needle gauge: 17 G Needle length: 9 cm and 9 Needle insertion depth: 4 cm Catheter type: closed end flexible Catheter size: 19 Gauge Catheter at skin depth: 9 cm Test dose: negative and Other  Assessment Events: blood not aspirated, no cerebrospinal fluid, injection not painful, no injection resistance, no paresthesia and negative IV test  Additional Notes Patient identified. Risks and benefits discussed including failed block, incomplete  Pain control, post dural puncture headache, nerve damage, paralysis, blood pressure Changes, nausea, vomiting, reactions to medications-both toxic and allergic and post Partum back pain. All questions were answered. Patient expressed understanding and wished to proceed. Sterile technique was used throughout procedure. Epidural site was Dressed with sterile barrier dressing. No paresthesias, signs of intravascular injection Or signs of intrathecal spread were encountered.  Patient was more comfortable after the epidural was dosed. Please see RN's note for documentation of vital signs and FHR which are stable.  Reason for block:procedure for pain

## 2023-05-21 NOTE — Progress Notes (Signed)
Labor Progress Note  Kristin Bruce is a 27 y.o. [id number] at [redacted]w[redacted]d presented for SOL  S: pt comfortable after epidural, no complaints at this time.   O:  BP 104/69   Pulse 70   Temp (!) 97.5 F (36.4 C) (Oral)   Resp 16   Ht 5' (1.524 m)   Wt 60.3 kg   LMP 08/20/2022   SpO2 99%   BMI 25.97 kg/m  EFM:130bpm/Moderate variability/ 15x15 accels/ None decels CAT: 1 Toco: regular, every 4 minutes   CVE: Dilation: 6 Effacement (%): 80 Cervical Position: Middle Station: -1 Presentation: Vertex Exam by:: Jordan Hawks, RN   A&P: 27 y.o. [id number] [redacted]w[redacted]d  here for SOL as above  #Labor: Progressing well. Tolerated AROM, clear fluid.  Mom and baby doing well.  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

## 2023-05-22 LAB — BIRTH TISSUE RECOVERY COLLECTION (PLACENTA DONATION)

## 2023-05-22 MED ORDER — GABAPENTIN 100 MG PO CAPS
200.0000 mg | ORAL_CAPSULE | Freq: Every day | ORAL | Status: DC
  Administered 2023-05-22: 200 mg via ORAL
  Filled 2023-05-22: qty 2

## 2023-05-22 MED ORDER — OXYCODONE HCL 5 MG PO TABS: 5.0000 mg | ORAL_TABLET | Freq: Four times a day (QID) | ORAL | Status: DC | PRN

## 2023-05-22 MED ORDER — POLYETHYLENE GLYCOL 3350 17 G PO PACK
17.0000 g | PACK | Freq: Every day | ORAL | Status: DC
  Administered 2023-05-22: 17 g via ORAL
  Filled 2023-05-22 (×2): qty 1

## 2023-05-22 MED ORDER — CYCLOBENZAPRINE HCL 5 MG PO TABS
10.0000 mg | ORAL_TABLET | Freq: Three times a day (TID) | ORAL | Status: DC | PRN
  Administered 2023-05-22 (×2): 10 mg via ORAL
  Filled 2023-05-22 (×2): qty 2

## 2023-05-22 NOTE — Progress Notes (Signed)
Called by nursing for continued 8/10 lower abdominal pain despite pain medication provided as ordered.   Pt comfortably resting in bed burping baby Complaining for 8/10 cramping of uterus and sore back at site of epidural.   Blood pressure 117/78, pulse 76, temperature 98.4 F (36.9 C), resp. rate 16, height 5' (1.524 m), weight 60.3 kg, last menstrual period 08/20/2022, SpO2 99%, unknown if currently breastfeeding.  CONSTITUTIONAL: Well-developed, well-nourished female in no acute distress.  HENT:  Normocephalic, atraumatic, External right and left ear normal. Oropharynx is clear and moist EYES: Conjunctivae and EOM are normal.  No scleral icterus.  NECK: Normal range of motion, supple, no masses.  Normal thyroid.  SKIN: Skin is warm and dry. No rash noted. Not diaphoretic. No erythema. No pallor. NEUROLGIC: Alert and oriented to person, place, and time.  CARDIOVASCULAR: Normal heart rate noted RESPIRATORY: Effort and breath sounds normal, no problems with respiration noted. ABDOMEN: Soft, normal bowel sounds, no distention noted.  No tenderness, rebound or guarding. Fundus firm at umbilicus MUSCULOSKELETAL: Normal range of motion. No tenderness.  No cyanosis, clubbing, or edema.   Discussed pain medication options; tylenol, motrin, gabapentin, flexeril, oxycodone. Pt would like to continue tylenol, motrin and oxycodone.

## 2023-05-22 NOTE — Clinical Social Work Maternal (Signed)
CLINICAL SOCIAL WORK MATERNAL/CHILD NOTE  Patient Details  Name: Kristin Bruce MRN: [id number] Date of Birth: 26-May-1996  Date:  05/22/2023  Clinical Social Worker Initiating Note:    Date/Time: Initiated:   /      Child's Name:   Kristin Bruce   Biological Parents:      Need for Interpreter:      Reason for Referral:      Address:  [address] [place] [address]    Phone number:  [phone] (home)     Additional phone number: na   Household Members/Support Persons (HM/SP):       HM/SP Name Relationship DOB or Age  HM/SP -1  Jaylen Siler   FOB    HM/SP -2  4 siblings       HM/SP -3        HM/SP -4        HM/SP -5        HM/SP -6        HM/SP -7        HM/SP -8          Natural Supports (not living in the home):   "not really"   Professional Supports:   not in therapy, takes anxiety meds-haven't taken since she found out she was pregnant.   Employment:   [employer]  Type of Work:   Psychologist, occupational:    some college  Homebound arranged:   na  Surveyor, quantity Resources:    child support, medicaid, [employer], food stamps, employment   Other Resources:      Cultural/Religious Considerations Which May Impact Care:  NA  Strengths:    works, home prepared for baby (other than not having a car seat)   Psychotropic Medications:    previously, can't remember the name.     Pediatrician:      Steva Ready Narda Rutherford of provider's name  Pediatrician List:   [facility]      Pediatrician Fax Number:    Risk Factors/Current Problems:    mood disorder  Cognitive State:  A&O x4   Mood/Affect:    flat  CSW Assessment:  CSW spoke with MOB via phone in reference to consult for mood disorder. MOB states she does have a mood disorder and was previously taking anxiety medication (she couldn't remember the name of the medication) before she stopped when she found out she  was pregnant. MOB states she feels at this time she is doing fine without any medications but verbalizes if she needs to start medication again she will. MOB states she used to participate in therapy but has not in a "long time". MOB states she does not have many supports outside of the home, she states in the home are her, FOB and four additional siblings. CSW inquired on whether or not MOB had everything she needed for baby, she states she is in need of a car seat. CSW advised the car seat cost is $30.00, pt states she will try to come up with the $30.00 but she may be able to get another car seat from elsewhere. CSW advised pt to call this CSW back and let her know. MOB confirmed she has a safe place for baby to sleep and has all other necessities for baby.   CSW provided education regarding the baby blues period vs.  perinatal mood disorders, discussed treatment and gave resources for mental health follow up if concerns arise.  CSW recommends self-evaluation during the postpartum time period using the New Mom Checklist from Postpartum Progress and encouraged MOB to contact a medical professional if symptoms are noted at any time.   CSW provided review of Sudden Infant Death Syndrome (SIDS) precautions.       CSW Plan/Description:  Sudden Infant Death Syndrome (SIDS) Education,  Perinatal Mood and Anxiety Disorder (PMADs) Education.   Carmina Miller, LCSWA 05/22/2023, 2:52 PM

## 2023-05-22 NOTE — Progress Notes (Signed)
POSTPARTUM PROGRESS NOTE  Post Partum Day 1  Subjective:  Kristin Bruce is a 27 y.o. [id number] s/p SVD at [redacted]w[redacted]d.  She reports she is doing well. No acute events overnight. She denies any problems with ambulating, voiding or po intake. Denies nausea or vomiting.  Pain is well controlled.  Lochia is appropriate.  Objective: Blood pressure 101/67, pulse (!) 56, temperature 97.7 F (36.5 C), temperature source Oral, resp. rate 18, height 5' (1.524 m), weight 60.3 kg, last menstrual period 08/20/2022, SpO2 100%, unknown if currently breastfeeding.  Physical Exam:  General: alert, cooperative and no distress Chest: no respiratory distress Heart:regular rate, distal pulses intact Uterine Fundus: firm, appropriately tender DVT Evaluation: No calf swelling or tenderness Extremities: trace edema Skin: warm, dry  Recent Labs    05/21/23 0518  HGB 9.8*  HCT 33.3*    Assessment/Plan: Kristin Bruce is a 27 y.o. [id number] s/p SVD at [redacted]w[redacted]d   PPD#1 - Doing well  Routine postpartum care  Contraception: Patch Feeding: Formula Dispo: Plan for discharge 11/4.   LOS: 1 day   Wyn Forster, MD OB Fellow  05/22/2023, 8:51 AM

## 2023-05-22 NOTE — Plan of Care (Signed)
Patient being medicated with pain meds for back and abdominal cramping for the entire shift. Kpad to back and hot packs to abdomen. Notified physician new orders for Flexiril and Neurotin received. Still complains of pain. Patient slept for 3 hours and woke up in pain. RN gave laxative to help patient  have a bowel movement. Patient states she does not want any more pain meds at this time and will let RN know if she wants more. She also stated that the pain meds work for about an hour and then wears off. MD aware.

## 2023-05-23 ENCOUNTER — Encounter: Payer: Self-pay | Admitting: Obstetrics and Gynecology

## 2023-05-23 DIAGNOSIS — Z0289 Encounter for other administrative examinations: Secondary | ICD-10-CM

## 2023-05-23 MED ORDER — CYCLOBENZAPRINE HCL 10 MG PO TABS: 10.0000 mg | ORAL_TABLET | Freq: Three times a day (TID) | ORAL | 0 refills | Status: DC | PRN

## 2023-05-23 MED ORDER — GABAPENTIN 100 MG PO CAPS: 200.0000 mg | ORAL_CAPSULE | Freq: Every day | ORAL | 0 refills | Status: DC

## 2023-05-23 NOTE — Progress Notes (Signed)
CSW received a consult for needing a carseat. CSW met MOB at bedside to receive a update about having the cost for the carseat. MOB reported checking in with her family and they are able to obtain her a carseat prior to being discharge.  MOB reported no other concerns at this time.   CSW identifies no further need for intervention and no barriers to discharge at this time.   Enos Fling, Theresia Majors Clinical Social Worker [phone]

## 2023-05-24 ENCOUNTER — Encounter: Payer: Self-pay | Admitting: General Practice

## 2023-05-24 ENCOUNTER — Encounter: Payer: Self-pay | Admitting: Family Medicine

## 2023-05-26 ENCOUNTER — Encounter: Payer: Medicaid Other | Admitting: Family Medicine

## 2023-06-02 ENCOUNTER — Encounter: Payer: Medicaid Other | Admitting: Obstetrics and Gynecology

## 2023-06-03 ENCOUNTER — Inpatient Hospital Stay (HOSPITAL_COMMUNITY): Payer: Medicaid Other

## 2023-06-03 ENCOUNTER — Inpatient Hospital Stay (HOSPITAL_COMMUNITY)
Admission: RE | Admit: 2023-06-03 | Payer: Medicaid Other | Source: Home / Self Care | Admitting: Obstetrics & Gynecology

## 2023-06-06 ENCOUNTER — Telehealth (HOSPITAL_COMMUNITY): Payer: Self-pay | Admitting: *Deleted

## 2023-06-06 NOTE — Telephone Encounter (Signed)
06/06/2023  Name: [name] MRN: [id number] DOB: October 08, 1995  Reason for Call:  Transition of Care Hospital Discharge Call  Contact Status: Patient Contact Status: Message  Language assistant needed:          Follow-Up Questions:    Inocente Salles Postnatal Depression Scale:  In the Past 7 Days:    PHQ2-9 Depression Scale:     Discharge Follow-up:    Post-discharge interventions: NA  Salena Saner, RN 06/06/2023  15:11

## 2023-06-22 ENCOUNTER — Other Ambulatory Visit: Payer: Self-pay

## 2023-07-07 ENCOUNTER — Ambulatory Visit: Payer: Medicaid Other | Admitting: Obstetrics and Gynecology

## 2023-08-18 ENCOUNTER — Ambulatory Visit: Payer: Medicaid Other | Admitting: Obstetrics and Gynecology

## 2023-10-10 IMAGING — CR DG CHEST 2V
2 series · 2 of 2 positions shown · non-contrast
Comparison: Chest x-ray 09/30/2021

CLINICAL DATA: chest pain

EXAM:
CHEST - 2 VIEW

[w chest pa]
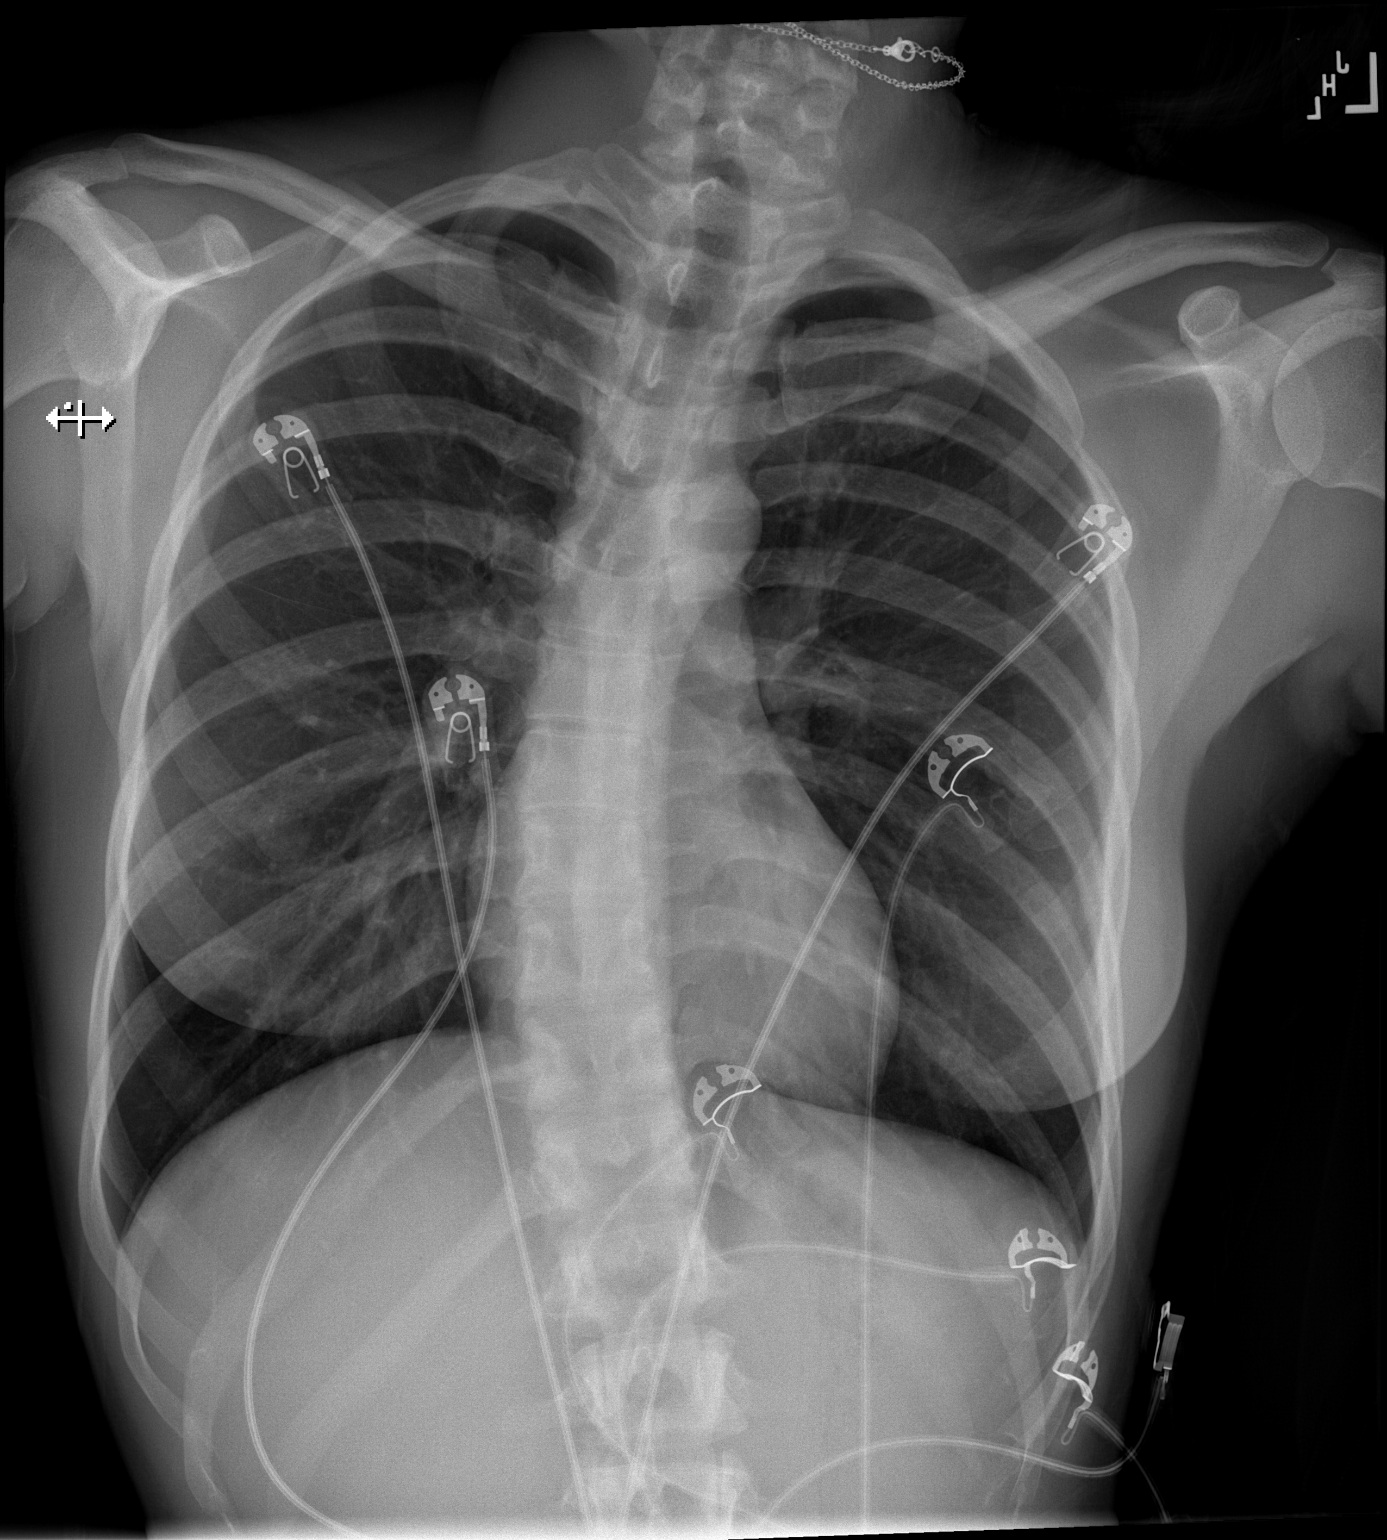

[w chest lat]
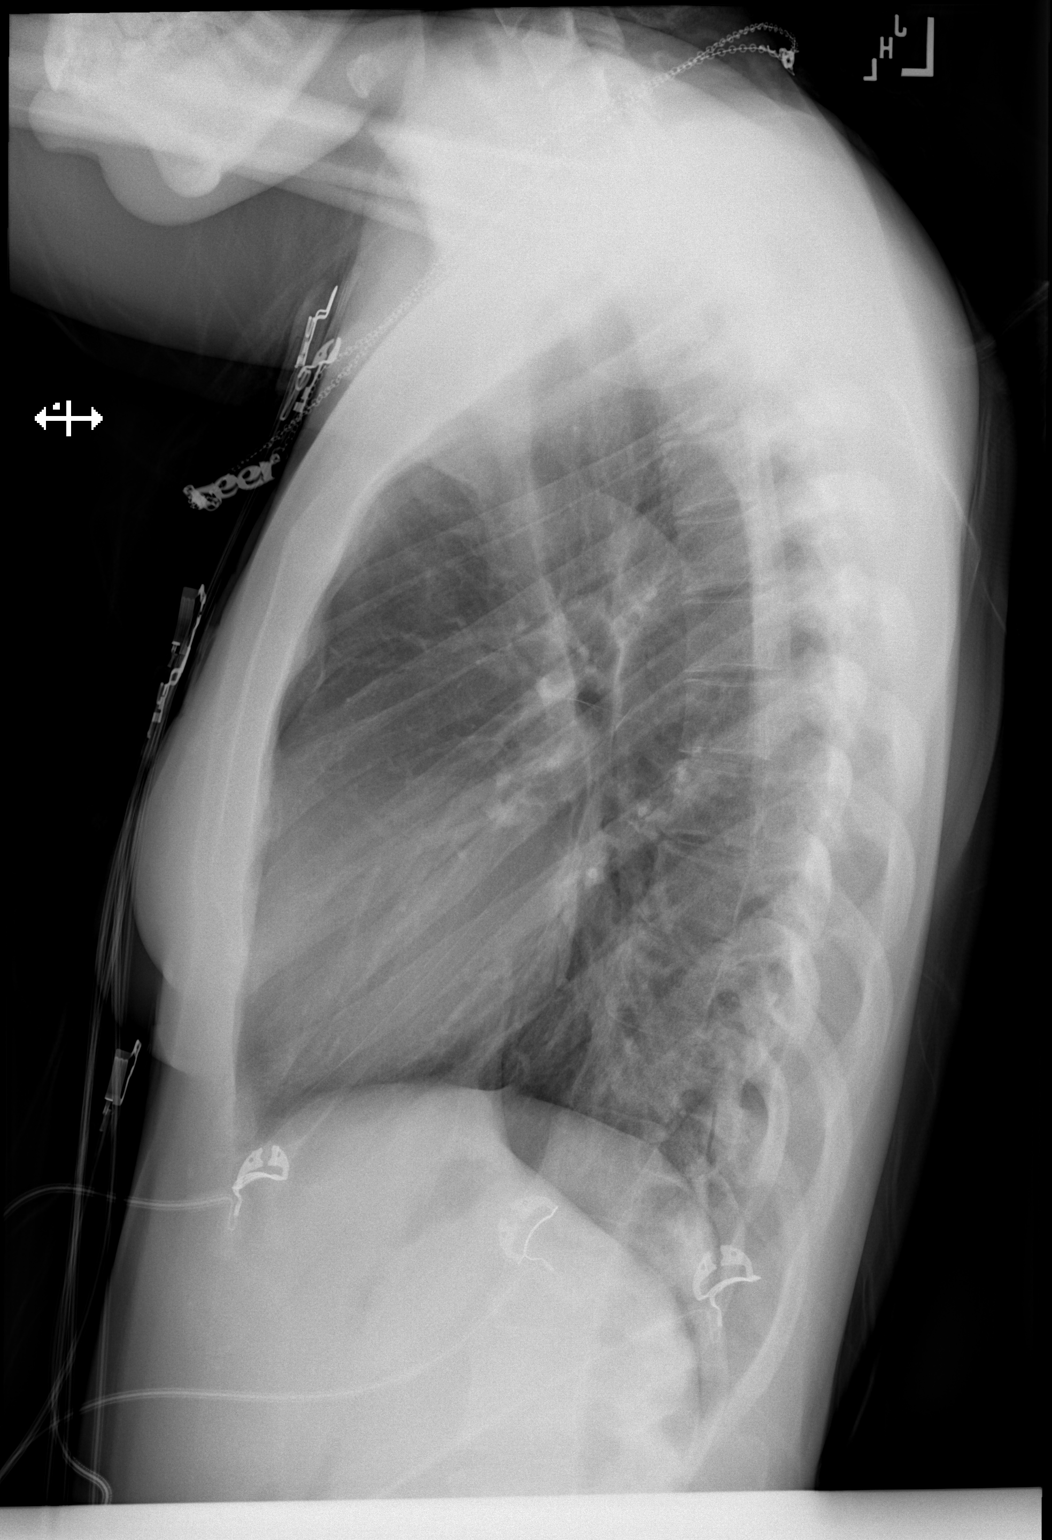

[2 of 2 positions shown; findings below may reference images not displayed]

FINDINGS: The heart and mediastinal contours are within normal limits.

No focal consolidation. No pulmonary edema. No pleural effusion. No
pneumothorax.

No acute osseous abnormality. Dextrocurvature of the thoracic spine.
IMPRESSION: No active cardiopulmonary disease.

## 2023-11-19 IMAGING — DX DG CHEST 2V
2 series · 2 of 2 positions shown · non-contrast
Comparison: None Available.

CLINICAL DATA: Chest pain, dyspnea

EXAM:
CHEST - 2 VIEW

[chest pa]
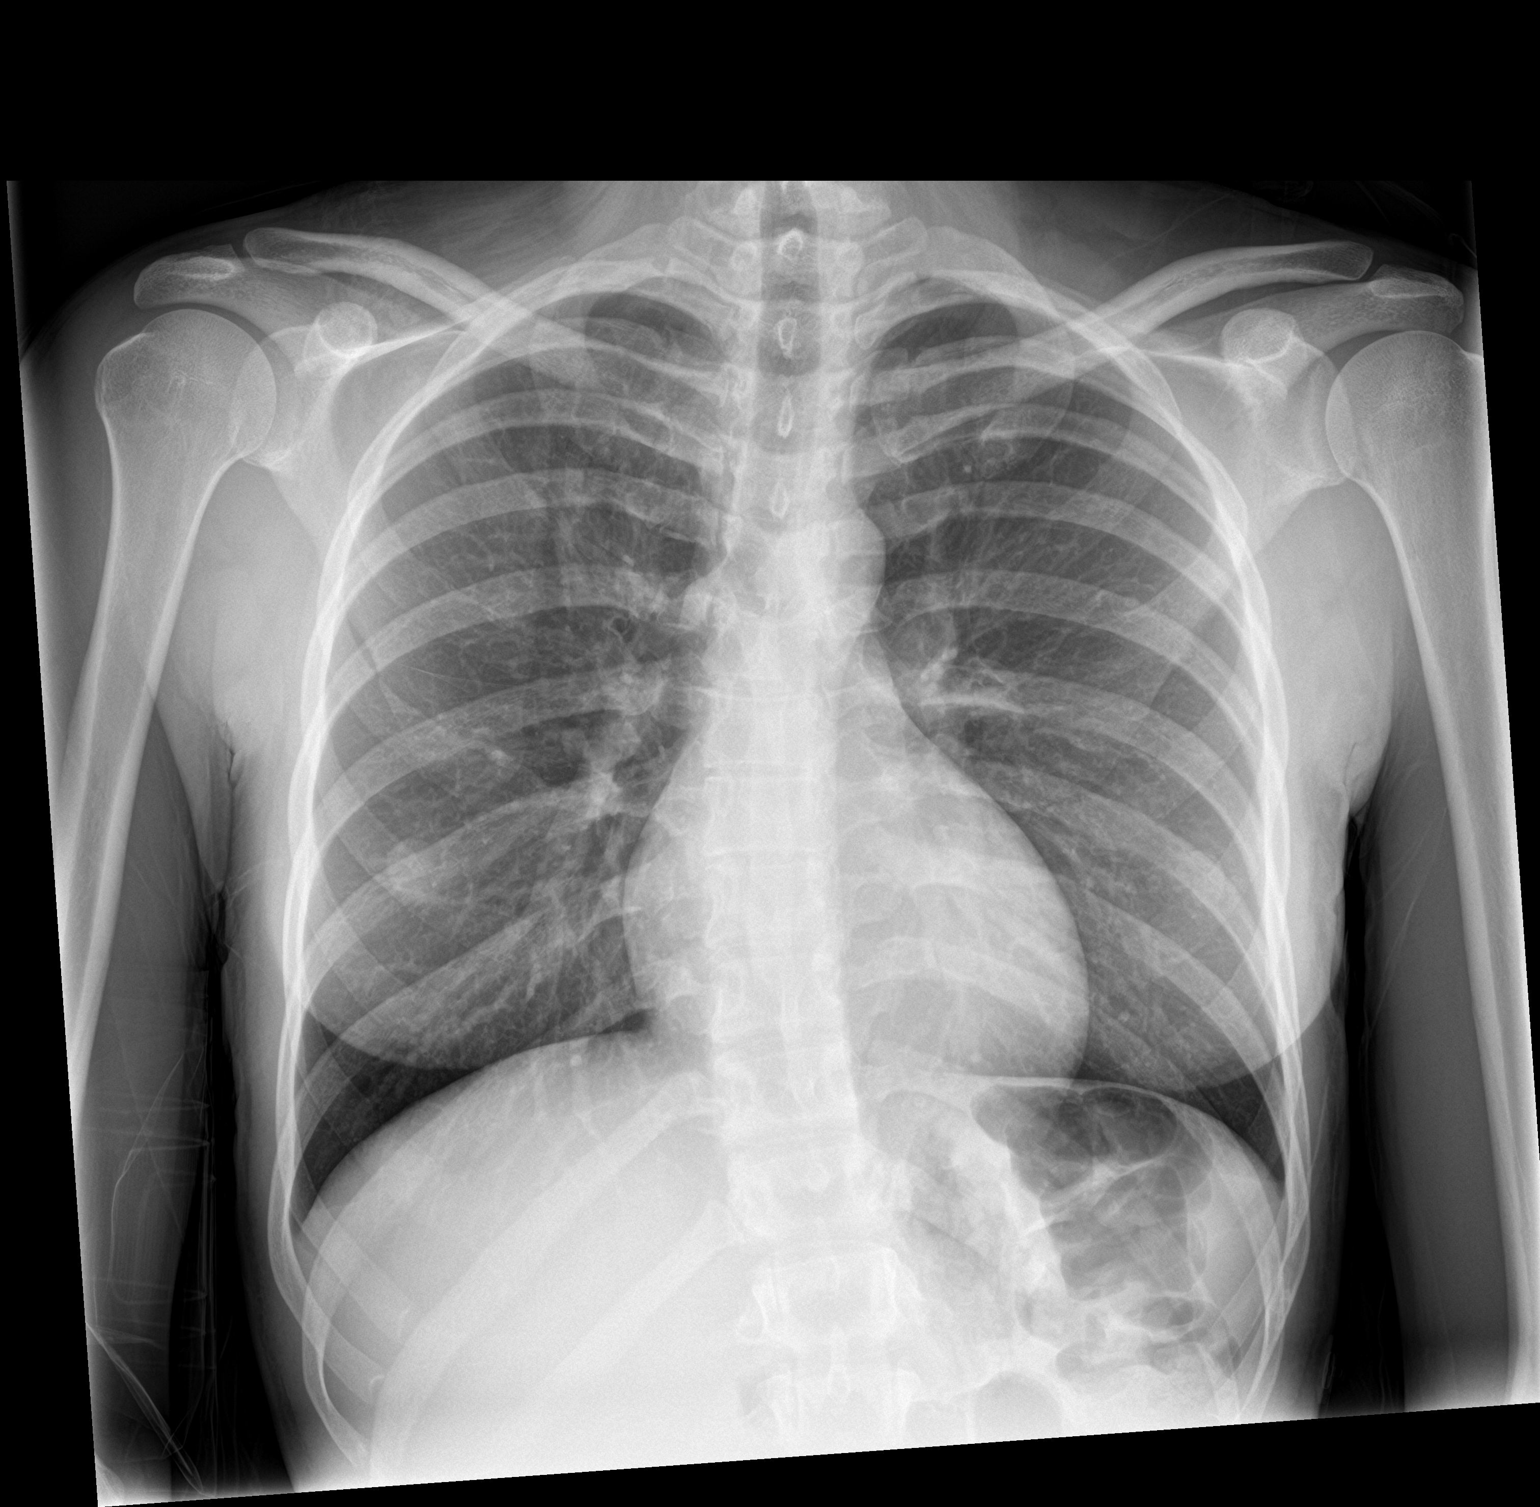

[chest lat]
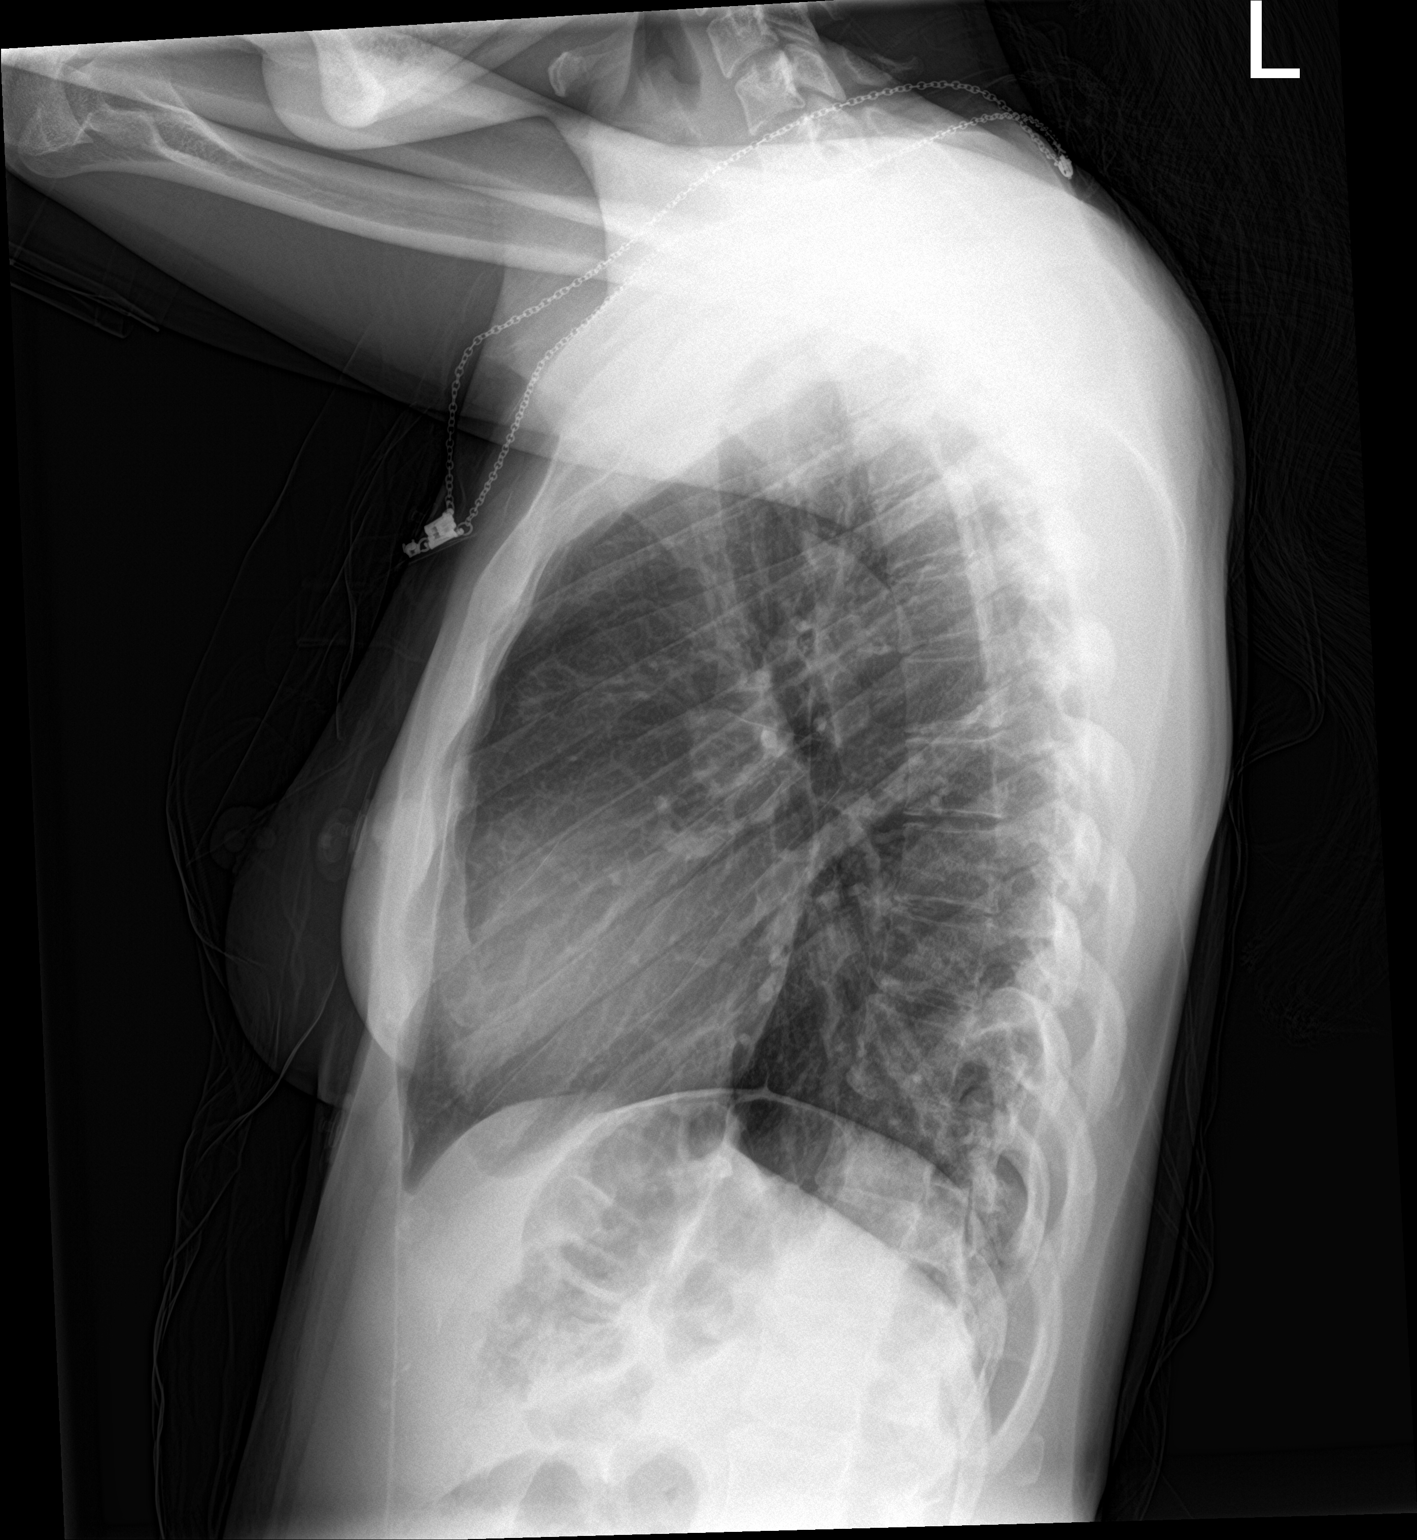

[2 of 2 positions shown; findings below may reference images not displayed]

FINDINGS: The heart size and mediastinal contours are within normal limits.
Both lungs are clear. The visualized skeletal structures are
unremarkable.
IMPRESSION: No active cardiopulmonary disease.

## 2024-06-26 ENCOUNTER — Ambulatory Visit: Admitting: Cardiology

## 2024-06-26 ENCOUNTER — Encounter: Payer: Self-pay | Admitting: *Deleted

## 2024-06-27 ENCOUNTER — Ambulatory Visit: Attending: Cardiology

## 2024-06-27 ENCOUNTER — Encounter: Payer: Self-pay | Admitting: Cardiology

## 2024-06-27 ENCOUNTER — Ambulatory Visit: Attending: Cardiology | Admitting: Cardiology

## 2024-06-27 VITALS — BP 114/60 | HR 79 | Ht 61.0 in | Wt 111.6 lb

## 2024-06-27 DIAGNOSIS — R002 Palpitations: Secondary | ICD-10-CM | POA: Insufficient documentation

## 2024-06-27 DIAGNOSIS — R0609 Other forms of dyspnea: Secondary | ICD-10-CM | POA: Insufficient documentation

## 2024-06-27 DIAGNOSIS — Z13220 Encounter for screening for lipoid disorders: Secondary | ICD-10-CM | POA: Diagnosis present

## 2024-06-27 NOTE — Patient Instructions (Signed)
 Medication Instructions:  Your physician recommends that you continue on your current medications as directed. Please refer to the Current Medication list given to you today.  *If you need a refill on your cardiac medications before your next appointment, please call your pharmacy*  Lab Work: CMET, Mag, Lipids, Lp(a), CBC, TSH If you have labs (blood work) drawn today and your tests are completely normal, you will receive your results only by: MyChart Message (if you have MyChart) OR A paper copy in the mail If you have any lab test that is abnormal or we need to change your treatment, we will call you to review the results.  Testing/Procedures: Your physician has requested that you have an echocardiogram. Echocardiography is a painless test that uses sound waves to create images of your heart. It provides your doctor with information about the size and shape of your heart and how well your hearts chambers and valves are working. This procedure takes approximately one hour. There are no restrictions for this procedure. Please do NOT wear cologne, perfume, aftershave, or lotions (deodorant is allowed). Please arrive 15 minutes prior to your appointment time.  Please note: We ask at that you not bring children with you during ultrasound (echo/ vascular) testing. Due to room size and safety concerns, children are not allowed in the ultrasound rooms during exams. Our front office staff cannot provide observation of children in our lobby area while testing is being conducted. An adult accompanying a patient to their appointment will only be allowed in the ultrasound room at the discretion of the ultrasound technician under special circumstances. We apologize for any inconvenience.  ZIO XT- Long Term Monitor Instructions  Your physician has requested you wear a ZIO patch monitor for 14 days.  This is a single patch monitor. [employer] supplies one patch monitor per enrollment. Additional stickers are  not available. Please do not apply patch if you will be having a Nuclear Stress Test,  Echocardiogram, Cardiac CT, MRI, or Chest Xray during the period you would be wearing the  monitor. The patch cannot be worn during these tests. You cannot remove and re-apply the  ZIO XT patch monitor.  Your ZIO patch monitor will be mailed 3 day USPS to your address on file. It may take 3-5 days  to receive your monitor after you have been enrolled.  Once you have received your monitor, please review the enclosed instructions. Your monitor  has already been registered assigning a specific monitor serial # to you.  Billing and Patient Assistance Program Information  We have supplied [employer] with any of your insurance information on file for billing purposes. [employer] offers a sliding scale Patient Assistance Program for patients that do not have  insurance, or whose insurance does not completely cover the cost of the ZIO monitor.  You must apply for the Patient Assistance Program to qualify for this discounted rate.  To apply, please call [employer] at [phone], select option 4, select option 2, ask to apply for  Patient Assistance Program. Meredeth will ask your household income, and how many people  are in your household. They will quote your out-of-pocket cost based on that information.  [employer] will also be able to set up a 44-month, interest-free payment plan if needed.  Applying the monitor   Shave hair from upper left chest.  Hold abrader disc by orange tab. Rub abrader in 40 strokes over the upper left chest as  indicated in your monitor instructions.  Clean area with 4  enclosed alcohol pads. Let dry.  Apply patch as indicated in monitor instructions. Patch will be placed under collarbone on left  side of chest with arrow pointing upward.  Rub patch adhesive wings for 2 minutes. Remove white label marked 1. Remove the white  label marked 2. Rub patch adhesive wings for 2 additional minutes.   While looking in a mirror, press and release button in center of patch. A small green light will  flash 3-4 times. This will be your only indicator that the monitor has been turned on.  Do not shower for the first 24 hours. You may shower after the first 24 hours.  Press the button if you feel a symptom. You will hear a small click. Record Date, Time and  Symptom in the Patient Logbook.  When you are ready to remove the patch, follow instructions on the last 2 pages of Patient  Logbook. Stick patch monitor onto the last page of Patient Logbook.  Place Patient Logbook in the blue and white box. Use locking tab on box and tape box closed  securely. The blue and white box has prepaid postage on it. Please place it in the mailbox as  soon as possible. Your physician should have your test results approximately 7 days after the  monitor has been mailed back to [facility].  Call [facility] Customer Care at [phone] if you have questions regarding  your ZIO XT patch monitor. Call them immediately if you see an orange light blinking on your  monitor.  If your monitor falls off in less than 4 days, contact our Monitor department at [phone].  If your monitor becomes loose or falls off after 4 days call [employer] at [phone] for  suggestions on securing your monitor   Follow-Up: At [facility], you and your health needs are our priority.  As part of our continuing mission to provide you with exceptional heart care, our providers are all part of one team.  This team includes your primary Cardiologist (physician) and Advanced Practice Providers or APPs (Physician Assistants and Nurse Practitioners) who all work together to provide you with the care you need, when you need it.  Your next appointment:   16 week(s)  Provider:   Kardie Tobb, DO

## 2024-06-27 NOTE — Progress Notes (Signed)
 Cardiology Office Note:    Date:  06/27/2024   ID:  Kristin Bruce, DOB [birth year], MRN [id number]  PCP:  Pcp, No  Cardiologist:  Dub Huntsman, DO  Electrophysiologist:  None   Referring MD: Ezzard Staci SAILOR, NP   No chief complaint on file.   History of Present Illness:    Kristin Bruce is a 28 y.o. female with a hx of adjustment disorder with depressed mood, anxiety, history of preterm delivery, migraine headaches here today for follow-up.  Her last visit with me was in May 2023.  At that time she complained of shortness of breath and palpitations she was pregnant.  I ordered a ZIO monitor it appears that the patient did not follow through with wearing this.  She is here today with complaints of palpitations and worsening shortness of breath.  She notes that this is getting worse progressively.  She cannot do even little household chores with her kids without getting significant short of breath.  She is concerned.  Past Medical History:  Diagnosis Date   Adjustment disorder with depressed mood 11/22/2015   Anemia    Anemia affecting pregnancy in third trimester 04/14/2022   Anxiety    Anxiety state 11/02/2021   Depression    Encounter for elective induction of labor 06/06/2022   GBS (group B streptococcus) infection    History of preterm delivery, currently pregnant 11/18/2021   HSV (herpes simplex virus) anogenital infection    Migraine 10/30/2020   Suicidal thoughts    during 1st pregnancy   Supervision of high risk pregnancy, antepartum 11/18/2021          Nursing Staff  Provider  Office Location     Dating   LMP = 10 wk us   PNC Model  [x]  Traditional  [ ]  Centering  [ ]  Mom-Baby Dyad        Language   English  Anatomy US    normal  Flu Vaccine   Declined  Genetic/Carrier Screen   NIPS:   Low risk female  AFP:   normal  Horizon:  TDaP Vaccine    03/31/22  Hgb A1C or   GTT  Early 4.2  Third trimester   COVID Vaccine  none     LAB RESULTS   Rhogam    SVD (spontaneous vaginal  delivery) 04/29/2021   Wears glasses     Past Surgical History:  Procedure Laterality Date   INCISION AND DRAINAGE ABSCESS N/A 05/13/2018   Procedure: INCISION AND DRAINAGE, PILONIDAL  ABSCESS;  Surgeon: Kimble Agent, MD;  Location: [facility] OR;  Service: General;  Laterality: N/A;   INCISION AND DRAINAGE PERIRECTAL ABSCESS N/A 04/01/2018   Procedure: IRRIGATION AND DEBRIDEMENT PERIRECTAL ABSCESS;  Surgeon: Sebastian Moles, MD;  Location: [facility] OR;  Service: General;  Laterality: N/A;   PILONIDAL CYST EXCISION N/A 07/20/2018   Procedure: EXCISION OF PILONIDAL DISEASE;  Surgeon: Teresa Lonni HERO, MD;  Location: [facility];  Service: General;  Laterality: N/A;    Current Medications: Current Meds  Medication Sig   Vitamin D, Ergocalciferol, (DRISDOL) 1.25 MG (50000 UNIT) CAPS capsule Take 50,000 Units by mouth once a week.   XULANE 150-35 MCG/24HR transdermal patch Place 1 patch onto the skin once a week.     Allergies:   Patient has no known allergies.   Social History   Socioeconomic History   Marital status: Single    Spouse name: Not on file   Number of children: 2   Years  of education: Not on file   Highest education level: Not on file  Occupational History   Not on file  Tobacco Use   Smoking status: Never   Smokeless tobacco: Never  Vaping Use   Vaping status: Never Used  Substance and Sexual Activity   Alcohol use: Not Currently    Comment: not since pregnancy   Drug use: Not Currently    Types: Marijuana    Comment: no using for 2 months   Sexual activity: Yes    Birth control/protection: None  Other Topics Concern   Not on file  Social History Narrative   Not on file   Social Drivers of Health   Financial Resource Strain: Not on file  Food Insecurity: No Food Insecurity (05/21/2023)   Hunger Vital Sign    Worried About Running Out of Food in the Last Year: Never true    Ran Out of Food in the Last Year: Never true  Transportation Needs:  Unmet Transportation Needs (05/21/2023)   PRAPARE - Administrator, Civil Service (Medical): Yes    Lack of Transportation (Non-Medical): No  Physical Activity: Not on file  Stress: Not on file  Social Connections: Not on file     Family History: The patient's family history includes Diabetes in her maternal grandfather and mother; Heart failure in her mother; Hypertension in her mother; Stroke in her mother.  ROS:   Review of Systems  Constitution: Negative for decreased appetite, fever and weight gain.  HENT: Negative for congestion, ear discharge, hoarse voice and sore throat.   Eyes: Negative for discharge, redness, vision loss in right eye and visual halos.  Cardiovascular: Negative for chest pain, dyspnea on exertion, leg swelling, orthopnea and palpitations.  Respiratory: Negative for cough, hemoptysis, shortness of breath and snoring.   Endocrine: Negative for heat intolerance and polyphagia.  Hematologic/Lymphatic: Negative for bleeding problem. Does not bruise/bleed easily.  Skin: Negative for flushing, nail changes, rash and suspicious lesions.  Musculoskeletal: Negative for arthritis, joint pain, muscle cramps, myalgias, neck pain and stiffness.  Gastrointestinal: Negative for abdominal pain, bowel incontinence, diarrhea and excessive appetite.  Genitourinary: Negative for decreased libido, genital sores and incomplete emptying.  Neurological: Negative for brief paralysis, focal weakness, headaches and loss of balance.  Psychiatric/Behavioral: Negative for altered mental status, depression and suicidal ideas.  Allergic/Immunologic: Negative for HIV exposure and persistent infections.    EKGs/Labs/Other Studies Reviewed:    The following studies were reviewed today:   EKG:  The ekg ordered today demonstrates   Recent Labs: No results found for requested labs within last 365 days.  Recent Lipid Panel No results found for: CHOL, TRIG, HDL, CHOLHDL,  VLDL, LDLCALC, LDLDIRECT  Physical Exam:    VS:  BP 114/60 (BP Location: Right Arm, Patient Position: Sitting, Cuff Size: Normal)   Pulse 79   Ht 5' 1 (1.549 m)   Wt 111 lb 9.6 oz (50.6 kg)   SpO2 99%   BMI 21.09 kg/m     Wt Readings from Last 3 Encounters:  06/27/24 111 lb 9.6 oz (50.6 kg)  05/21/23 133 lb (60.3 kg)  05/19/23 133 lb (60.3 kg)     GEN: Well nourished, well developed in no acute distress HEENT: Normal NECK: No JVD; No carotid bruits LYMPHATICS: No lymphadenopathy CARDIAC: S1S2 noted,RRR, no murmurs, rubs, gallops RESPIRATORY:  Clear to auscultation without rales, wheezing or rhonchi  ABDOMEN: Soft, non-tender, non-distended, +bowel sounds, no guarding. EXTREMITIES: No edema, No cyanosis, no clubbing  MUSCULOSKELETAL:  No deformity  SKIN: Warm and dry NEUROLOGIC:  Alert and oriented x 3, non-focal PSYCHIATRIC:  Normal affect, good insight  ASSESSMENT:    1. Palpitations   2. Screening for hyperlipidemia   3. DOE (dyspnea on exertion)    PLAN:     I would like to rule out a cardiovascular etiology of this palpitation, therefore at this time I would like to placed a zio patch for  14  days. In additon with her worsening shortness of breath a transthoracic echocardiogram will be ordered to assess LV/RV function and any structural abnormalities. Once these testing have been performed amd reviewed further reccomendations will be made. For now, I do reccomend that the patient goes to the nearest ED if  symptoms recur.  Also get blood work for TSH, CMP and mag.  Will screen for hyperlipidemia this has never been done and is 28 year old.  The patient is in agreement with the above plan. The patient left the office in stable condition.  The patient will follow up in   Medication Adjustments/Labs and Tests Ordered: Current medicines are reviewed at length with the patient today.  Concerns regarding medicines are outlined above.  Orders Placed This Encounter   Procedures   Comp Met (CMET)   Magnesium    Lipid panel   Lipoprotein A (LPA)   TSH+T4F+T3Free   LONG TERM MONITOR (3-14 DAYS)   EKG 12-Lead   ECHOCARDIOGRAM COMPLETE   No orders of the defined types were placed in this encounter.   Patient Instructions  Medication Instructions:  Your physician recommends that you continue on your current medications as directed. Please refer to the Current Medication list given to you today.  *If you need a refill on your cardiac medications before your next appointment, please call your pharmacy*  Lab Work: CMET, Mag, Lipids, Lp(a), CBC, TSH If you have labs (blood work) drawn today and your tests are completely normal, you will receive your results only by: MyChart Message (if you have MyChart) OR A paper copy in the mail If you have any lab test that is abnormal or we need to change your treatment, we will call you to review the results.  Testing/Procedures: Your physician has requested that you have an echocardiogram. Echocardiography is a painless test that uses sound waves to create images of your heart. It provides your doctor with information about the size and shape of your heart and how well your hearts chambers and valves are working. This procedure takes approximately one hour. There are no restrictions for this procedure. Please do NOT wear cologne, perfume, aftershave, or lotions (deodorant is allowed). Please arrive 15 minutes prior to your appointment time.  Please note: We Bruce at that you not bring children with you during ultrasound (echo/ vascular) testing. Due to room size and safety concerns, children are not allowed in the ultrasound rooms during exams. Our front office staff cannot provide observation of children in our lobby area while testing is being conducted. An adult accompanying a patient to their appointment will only be allowed in the ultrasound room at the discretion of the ultrasound technician under special  circumstances. We apologize for any inconvenience.  ZIO XT- Long Term Monitor Instructions  Your physician has requested you wear a ZIO patch monitor for 14 days.  This is a single patch monitor. [employer] supplies one patch monitor per enrollment. Additional stickers are not available. Please do not apply patch if you will be having a Nuclear Stress Test,  Echocardiogram, Cardiac CT, MRI, or Chest Xray during the period you would be wearing the  monitor. The patch cannot be worn during these tests. You cannot remove and re-apply the  ZIO XT patch monitor.  Your ZIO patch monitor will be mailed 3 day USPS to your address on file. It may take 3-5 days  to receive your monitor after you have been enrolled.  Once you have received your monitor, please review the enclosed instructions. Your monitor  has already been registered assigning a specific monitor serial # to you.  Billing and Patient Assistance Program Information  We have supplied [employer] with any of your insurance information on file for billing purposes. [employer] offers a sliding scale Patient Assistance Program for patients that do not have  insurance, or whose insurance does not completely cover the cost of the ZIO monitor.  You must apply for the Patient Assistance Program to qualify for this discounted rate.  To apply, please call [employer] at [phone], select option 4, select option 2, Bruce to apply for  Patient Assistance Program. Meredeth will Bruce your household income, and how many people  are in your household. They will quote your out-of-pocket cost based on that information.  [employer] will also be able to set up a 81-month, interest-free payment plan if needed.  Applying the monitor   Shave hair from upper left chest.  Hold abrader disc by orange tab. Rub abrader in 40 strokes over the upper left chest as  indicated in your monitor instructions.  Clean area with 4 enclosed alcohol pads. Let dry.  Apply patch as  indicated in monitor instructions. Patch will be placed under collarbone on left  side of chest with arrow pointing upward.  Rub patch adhesive wings for 2 minutes. Remove white label marked 1. Remove the white  label marked 2. Rub patch adhesive wings for 2 additional minutes.  While looking in a mirror, press and release button in center of patch. A small green light will  flash 3-4 times. This will be your only indicator that the monitor has been turned on.  Do not shower for the first 24 hours. You may shower after the first 24 hours.  Press the button if you feel a symptom. You will hear a small click. Record Date, Time and  Symptom in the Patient Logbook.  When you are ready to remove the patch, follow instructions on the last 2 pages of Patient  Logbook. Stick patch monitor onto the last page of Patient Logbook.  Place Patient Logbook in the blue and white box. Use locking tab on box and tape box closed  securely. The blue and white box has prepaid postage on it. Please place it in the mailbox as  soon as possible. Your physician should have your test results approximately 7 days after the  monitor has been mailed back to [facility].  Call [facility] Customer Care at [phone] if you have questions regarding  your ZIO XT patch monitor. Call them immediately if you see an orange light blinking on your  monitor.  If your monitor falls off in less than 4 days, contact our Monitor department at [phone].  If your monitor becomes loose or falls off after 4 days call [employer] at [phone] for  suggestions on securing your monitor   Follow-Up: At [facility], you and your health needs are our priority.  As part of our continuing mission to provide you with exceptional heart care, our providers are all part  of one team.  This team includes your primary Cardiologist (physician) and Advanced Practice Providers or APPs (Physician Assistants and Nurse  Practitioners) who all work together to provide you with the care you need, when you need it.  Your next appointment:   16 week(s)  Provider:   Amayrani Bennick, DO      Adopting a Healthy Lifestyle.  Know what a healthy weight is for you (roughly BMI <25) and aim to maintain this   Aim for 7+ servings of fruits and vegetables daily   65-80+ fluid ounces of water or unsweet tea for healthy kidneys   Limit to max 1 drink of alcohol per day; avoid smoking/tobacco   Limit animal fats in diet for cholesterol and heart health - choose grass fed whenever available   Avoid highly processed foods, and foods high in saturated/trans fats   Aim for low stress - take time to unwind and care for your mental health   Aim for 150 min of moderate intensity exercise weekly for heart health, and weights twice weekly for bone health   Aim for 7-9 hours of sleep daily   When it comes to diets, agreement about the perfect plan isnt easy to find, even among the experts. Experts at the [facility] of [employer] developed an idea known as the Healthy Eating Plate. Just imagine a plate divided into logical, healthy portions.   The emphasis is on diet quality:   Load up on vegetables and fruits - one-half of your plate: Aim for color and variety, and remember that potatoes dont count.   Go for whole grains - one-quarter of your plate: Whole wheat, barley, wheat berries, quinoa, oats, brown rice, and foods made with them. If you want pasta, go with whole wheat pasta.   Protein power - one-quarter of your plate: Fish, chicken, beans, and nuts are all healthy, versatile protein sources. Limit red meat.   The diet, however, does go beyond the plate, offering a few other suggestions.   Use healthy plant oils, such as olive, canola, soy, corn, sunflower and peanut. Check the labels, and avoid partially hydrogenated oil, which have unhealthy trans fats.   If youre thirsty, drink water. Coffee and tea  are good in moderation, but skip sugary drinks and limit milk and dairy products to one or two daily servings.   The type of carbohydrate in the diet is more important than the amount. Some sources of carbohydrates, such as vegetables, fruits, whole grains, and beans-are healthier than others.   Finally, stay active  Signed, Dub Huntsman, DO  06/27/2024 8:59 AM    [facility]

## 2024-06-27 NOTE — Progress Notes (Unsigned)
 Enrolled patient for a 14 day Zio XT  monitor to be mailed to patients home

## 2024-06-28 ENCOUNTER — Ambulatory Visit: Payer: Self-pay | Admitting: Cardiology

## 2024-06-28 LAB — MAGNESIUM: Magnesium: 1.7 mg/dL (ref 1.6–2.3)

## 2024-06-28 LAB — LIPID PANEL
Chol/HDL Ratio: 3.1 ratio (ref 0.0–4.4)
Cholesterol, Total: 185 mg/dL (ref 100–199)
HDL: 59 mg/dL (ref >39)
LDL Chol Calc (NIH): 112 mg/dL — ABNORMAL HIGH (ref 0–99)
Triglycerides: 75 mg/dL (ref 0–149)
VLDL Cholesterol Cal: 14 mg/dL (ref 5–40)

## 2024-06-28 LAB — TSH+T4F+T3FREE
Free T4: 1.11 ng/dL (ref 0.82–1.77)
T3, Free: 3.5 pg/mL (ref 2.0–4.4)
TSH: 1.86 u[IU]/mL (ref 0.450–4.500)

## 2024-06-28 LAB — COMPREHENSIVE METABOLIC PANEL WITH GFR
ALT: 14 IU/L (ref 0–32)
AST: 12 IU/L (ref 0–40)
Albumin: 4.1 g/dL (ref 4.0–5.0)
Alkaline Phosphatase: 54 IU/L (ref 41–116)
BUN/Creatinine Ratio: 16 (ref 9–23)
BUN: 11 mg/dL (ref 6–20)
Bilirubin Total: 1 mg/dL (ref 0.0–1.2)
CO2: 21 mmol/L (ref 20–29)
Calcium: 9.2 mg/dL (ref 8.7–10.2)
Chloride: 101 mmol/L (ref 96–106)
Creatinine, Ser: 0.69 mg/dL (ref 0.57–1.00)
Globulin, Total: 3.1 g/dL (ref 1.5–4.5)
Glucose: 75 mg/dL (ref 70–99)
Potassium: 4.3 mmol/L (ref 3.5–5.2)
Sodium: 137 mmol/L (ref 134–144)
Total Protein: 7.2 g/dL (ref 6.0–8.5)
eGFR: 121 mL/min/1.73 (ref >59)

## 2024-06-28 LAB — LIPOPROTEIN A (LPA): Lipoprotein (a): 53.8 nmol/L (ref <75.0)

## 2024-08-02 ENCOUNTER — Inpatient Hospital Stay (HOSPITAL_COMMUNITY)
Admission: AD | Admit: 2024-08-02 | Discharge: 2024-08-02 | Disposition: A | Discharge status: Home / Self Care | Attending: Obstetrics and Gynecology | Admitting: Obstetrics and Gynecology

## 2024-08-02 DIAGNOSIS — R109 Unspecified abdominal pain: Secondary | ICD-10-CM | POA: Diagnosis present

## 2024-08-02 DIAGNOSIS — Z113 Encounter for screening for infections with a predominantly sexual mode of transmission: Secondary | ICD-10-CM | POA: Insufficient documentation

## 2024-08-02 DIAGNOSIS — Z793 Long term (current) use of hormonal contraceptives: Secondary | ICD-10-CM | POA: Insufficient documentation

## 2024-08-02 DIAGNOSIS — Z3202 Encounter for pregnancy test, result negative: Secondary | ICD-10-CM | POA: Insufficient documentation

## 2024-08-02 DIAGNOSIS — N760 Acute vaginitis: Secondary | ICD-10-CM | POA: Diagnosis not present

## 2024-08-02 DIAGNOSIS — B9689 Other specified bacterial agents as the cause of diseases classified elsewhere: Secondary | ICD-10-CM | POA: Insufficient documentation

## 2024-08-02 LAB — WET PREP, GENITAL
Sperm: NONE SEEN
Trich, Wet Prep: NONE SEEN
WBC, Wet Prep HPF POC: >=10 — AB
Yeast Wet Prep HPF POC: NONE SEEN

## 2024-08-02 LAB — HEPATITIS B SURFACE ANTIGEN: Hepatitis B Surface Ag: NONREACTIVE

## 2024-08-02 LAB — HIV ANTIBODY (ROUTINE TESTING W REFLEX): HIV Screen 4th Generation wRfx: NONREACTIVE

## 2024-08-02 LAB — HCG, QUANTITATIVE, PREGNANCY: hCG, Beta Chain, Quant, S: <1 m[IU]/mL

## 2024-08-02 LAB — POCT PREGNANCY, URINE: Preg Test, Ur: NEGATIVE

## 2024-08-02 MED ORDER — METRONIDAZOLE 0.75 % VA GEL: 1.0000 | Freq: Every day | VAGINAL | 1 refills | Status: AC

## 2024-08-02 NOTE — MAU Note (Signed)
 Kristin Bruce is a 29 y.o. at Unknown here in MAU reporting: pt reports she is having abd pain and cramping x 3 days. Not sure when she had her last period. Maybe end of December. Took a HPT and she thought  there was a faint line. MAU test is negative. Denies any vag bleeding or dischrge.   LMP: unsure maybe end of December Onset of complaint: 3 days Pain score: 5 Vitals:   08/02/24 1149  BP: 108/77  Pulse: 77  Resp: 18  Temp: 98.3 F (36.8 C)     FHT: na/  Lab orders placed from triage: UPT

## 2024-08-02 NOTE — MAU Provider Note (Signed)
"   None     [name] is a 29 y.o. [phone] non-pregnant female who presents to MAU today with complaint of abdominal pain. She reports faint positive on HPT, with additional symptoms of nausea with 2 episodes of emesis, and breast tenderness. Unsure of LMP around the end of December. Contraceptive patch for birth control, might have been a few days late putting it on. Concerned about STI exposure and cramping.   Receives care at [facility]. Records reviewed.   Pertinent items noted in HPI and remainder of comprehensive ROS otherwise negative.   O BP 108/77   Pulse 77   Temp 98.3 F (36.8 C)   Resp 18  Physical Exam Vitals and nursing note reviewed.  Constitutional:      Appearance: Normal appearance. She is well-developed. She is not ill-appearing, toxic-appearing or diaphoretic.  HENT:     Head: Normocephalic.  Cardiovascular:     Rate and Rhythm: Normal rate and regular rhythm.     Pulses: Normal pulses.     Heart sounds: Normal heart sounds.  Pulmonary:     Effort: Pulmonary effort is normal.     Breath sounds: Normal breath sounds.  Skin:    General: Skin is warm and dry.     Capillary Refill: Capillary refill takes less than 2 seconds.  Neurological:     General: No focal deficit present.     Mental Status: She is alert and oriented to person, place, and time.  Psychiatric:        Mood and Affect: Mood normal.        Behavior: Behavior normal.      MDM:  Moderate   - Evaluate for infection, early pregnancy.  - UPT here negative, beta HcG.  - STI panel due to concern for exposure, ambiguous symptoms.  - Wet prep positive for BV. STI panel pending.  - Patient not pregnant.   MAU Course:  A Bacterial vaginosis - Plan: Discharge patient  Abdominal cramping  Medical screening exam complete  P Discharge from MAU in stable condition with return precautions Patient given option to follow up in ED or other preferred provider as needed. Patient  satisfied with negative pregnancy testing and BV treatment.  Recommend continuing contraceptive patch, as patient satisfied with this method.  Metrogel  70g 0.75% for use PV nightly for 5 nights for BV as below.  Allergies as of 08/02/2024   No Known Allergies      Medication List     STOP taking these medications    cyclobenzaprine  10 MG tablet Commonly known as: FLEXERIL    gabapentin  100 MG capsule Commonly known as: NEURONTIN    Prenatal 27-1 MG Tabs   Vitamin D (Ergocalciferol) 1.25 MG (50000 UNIT) Caps capsule Commonly known as: DRISDOL       TAKE these medications    metroNIDAZOLE  0.75 % vaginal gel Commonly known as: METROGEL  Place 1 Applicatorful vaginally at bedtime. Apply one applicatorful to vagina at bedtime for 5 days   Xulane 150-35 MCG/24HR transdermal patch Generic drug: norelgestromin -ethinyl estradiol  Place 1 patch onto the skin once a week.        Camie Rote, MSN, CNM 08/02/2024 2:02 PM  Certified Nurse Midwife, [facility]  "

## 2024-08-03 LAB — GC/CHLAMYDIA PROBE AMP (~~LOC~~) NOT AT ARMC
Chlamydia: NEGATIVE
Comment: NEGATIVE
Comment: NORMAL
Neisseria Gonorrhea: NEGATIVE

## 2024-08-03 LAB — SYPHILIS: RPR W/REFLEX TO RPR TITER AND TREPONEMAL ANTIBODIES, TRADITIONAL SCREENING AND DIAGNOSIS ALGORITHM: RPR Ser Ql: NONREACTIVE

## 2024-08-03 LAB — RUBELLA SCREEN: Rubella: 2.57 {index}

## 2024-08-08 ENCOUNTER — Ambulatory Visit (HOSPITAL_COMMUNITY)
Admission: RE | Admit: 2024-08-08 | Discharge: 2024-08-08 | Disposition: A | Discharge status: Home / Self Care | Source: Ambulatory Visit | Attending: Cardiology | Admitting: Cardiology

## 2024-08-08 DIAGNOSIS — R0609 Other forms of dyspnea: Secondary | ICD-10-CM | POA: Insufficient documentation

## 2024-08-08 LAB — ECHOCARDIOGRAM COMPLETE
Area-P 1/2: 3.66 cm2
S' Lateral: 3.1 cm

## 2024-10-12 ENCOUNTER — Ambulatory Visit: Admitting: Cardiology
